# Patient Record
Sex: Male | Born: 1935 | Race: White | Hispanic: No | Marital: Single | Smoking: Former smoker
Health system: Southern US, Community
[De-identification: ages and names within clinical notes are randomized; demographics above are authoritative.]

## PROBLEM LIST (undated history)

## (undated) DIAGNOSIS — G453 Amaurosis fugax: Secondary | ICD-10-CM

## (undated) DIAGNOSIS — K219 Gastro-esophageal reflux disease without esophagitis: Secondary | ICD-10-CM

## (undated) DIAGNOSIS — I639 Cerebral infarction, unspecified: Secondary | ICD-10-CM

## (undated) DIAGNOSIS — E039 Hypothyroidism, unspecified: Secondary | ICD-10-CM

## (undated) DIAGNOSIS — N4289 Other specified disorders of prostate: Secondary | ICD-10-CM

## (undated) DIAGNOSIS — G20 Parkinson's disease: Secondary | ICD-10-CM

## (undated) DIAGNOSIS — H8309 Labyrinthitis, unspecified ear: Secondary | ICD-10-CM

## (undated) DIAGNOSIS — Z87442 Personal history of urinary calculi: Secondary | ICD-10-CM

## (undated) DIAGNOSIS — N200 Calculus of kidney: Secondary | ICD-10-CM

## (undated) DIAGNOSIS — R51 Headache: Secondary | ICD-10-CM

## (undated) DIAGNOSIS — I10 Essential (primary) hypertension: Secondary | ICD-10-CM

## (undated) DIAGNOSIS — Z7901 Long term (current) use of anticoagulants: Secondary | ICD-10-CM

## (undated) DIAGNOSIS — Z8719 Personal history of other diseases of the digestive system: Secondary | ICD-10-CM

## (undated) DIAGNOSIS — G20A1 Parkinson's disease without dyskinesia, without mention of fluctuations: Secondary | ICD-10-CM

## (undated) DIAGNOSIS — H269 Unspecified cataract: Secondary | ICD-10-CM

## (undated) DIAGNOSIS — N400 Enlarged prostate without lower urinary tract symptoms: Secondary | ICD-10-CM

## (undated) DIAGNOSIS — L111 Transient acantholytic dermatosis [Grover]: Secondary | ICD-10-CM

## (undated) DIAGNOSIS — E7800 Pure hypercholesterolemia, unspecified: Secondary | ICD-10-CM

## (undated) DIAGNOSIS — R519 Headache, unspecified: Secondary | ICD-10-CM

## (undated) HISTORY — DX: Benign prostatic hyperplasia without lower urinary tract symptoms: N40.0

## (undated) HISTORY — DX: Headache, unspecified: R51.9

## (undated) HISTORY — PX: TONSILLECTOMY: SUR1361

## (undated) HISTORY — DX: Parkinson's disease: G20

## (undated) HISTORY — DX: Headache: R51

## (undated) HISTORY — DX: Cerebral infarction, unspecified: I63.9

## (undated) HISTORY — DX: Personal history of urinary calculi: Z87.442

## (undated) HISTORY — DX: Gastro-esophageal reflux disease without esophagitis: K21.9

## (undated) HISTORY — DX: Parkinson's disease without dyskinesia, without mention of fluctuations: G20.A1

## (undated) HISTORY — DX: Unspecified cataract: H26.9

## (undated) HISTORY — PX: LITHOTRIPSY: SUR834

## (undated) HISTORY — DX: Hypothyroidism, unspecified: E03.9

## (undated) HISTORY — DX: Labyrinthitis, unspecified ear: H83.09

## (undated) HISTORY — PX: CATARACT EXTRACTION, BILATERAL: SHX1313

## (undated) HISTORY — DX: Long term (current) use of anticoagulants: Z79.01

## (undated) HISTORY — DX: Calculus of kidney: N20.0

## (undated) HISTORY — DX: Amaurosis fugax: G45.3

## (undated) HISTORY — DX: Transient acantholytic dermatosis (grover): L11.1

## (undated) HISTORY — DX: Personal history of other diseases of the digestive system: Z87.19

---

## 1997-10-11 ENCOUNTER — Other Ambulatory Visit: Admission: RE | Admit: 1997-10-11 | Discharge: 1997-10-11 | Payer: Self-pay | Admitting: Urology

## 2002-08-31 ENCOUNTER — Encounter: Payer: Self-pay | Admitting: Emergency Medicine

## 2002-08-31 ENCOUNTER — Emergency Department (HOSPITAL_COMMUNITY): Admission: EM | Admit: 2002-08-31 | Discharge: 2002-08-31 | Payer: Self-pay | Admitting: Emergency Medicine

## 2003-04-04 ENCOUNTER — Encounter: Admission: RE | Admit: 2003-04-04 | Discharge: 2003-04-04 | Payer: Self-pay | Admitting: Gastroenterology

## 2003-04-06 ENCOUNTER — Emergency Department (HOSPITAL_COMMUNITY): Admission: EM | Admit: 2003-04-06 | Discharge: 2003-04-06 | Payer: Self-pay | Admitting: Emergency Medicine

## 2003-04-06 ENCOUNTER — Encounter: Admission: RE | Admit: 2003-04-06 | Discharge: 2003-04-06 | Payer: Self-pay | Admitting: Gastroenterology

## 2005-10-01 ENCOUNTER — Emergency Department (HOSPITAL_COMMUNITY): Admission: EM | Admit: 2005-10-01 | Discharge: 2005-10-02 | Payer: Self-pay | Admitting: Emergency Medicine

## 2009-09-11 ENCOUNTER — Encounter: Admission: RE | Admit: 2009-09-11 | Discharge: 2009-09-11 | Payer: Self-pay | Admitting: Internal Medicine

## 2010-05-25 ENCOUNTER — Other Ambulatory Visit: Payer: Self-pay | Admitting: Sports Medicine

## 2010-05-25 DIAGNOSIS — M5126 Other intervertebral disc displacement, lumbar region: Secondary | ICD-10-CM

## 2010-05-25 DIAGNOSIS — M545 Low back pain: Secondary | ICD-10-CM

## 2010-05-28 ENCOUNTER — Ambulatory Visit
Admission: RE | Admit: 2010-05-28 | Discharge: 2010-05-28 | Disposition: A | Discharge status: Home / Self Care | Payer: Medicare Other | Source: Ambulatory Visit | Attending: Sports Medicine | Admitting: Sports Medicine

## 2010-05-28 DIAGNOSIS — M545 Low back pain: Secondary | ICD-10-CM

## 2010-05-28 DIAGNOSIS — M5126 Other intervertebral disc displacement, lumbar region: Secondary | ICD-10-CM

## 2012-02-03 ENCOUNTER — Emergency Department (HOSPITAL_COMMUNITY): Payer: Medicare Other

## 2012-02-03 ENCOUNTER — Encounter (HOSPITAL_COMMUNITY): Payer: Self-pay

## 2012-02-03 ENCOUNTER — Inpatient Hospital Stay (HOSPITAL_COMMUNITY)
Admission: EM | Admit: 2012-02-03 | Discharge: 2012-02-06 | DRG: 419 | Disposition: A | Discharge status: Home / Self Care | Payer: Medicare Other | Attending: General Surgery | Admitting: General Surgery

## 2012-02-03 DIAGNOSIS — E7800 Pure hypercholesterolemia, unspecified: Secondary | ICD-10-CM | POA: Diagnosis present

## 2012-02-03 DIAGNOSIS — I10 Essential (primary) hypertension: Secondary | ICD-10-CM

## 2012-02-03 DIAGNOSIS — K8000 Calculus of gallbladder with acute cholecystitis without obstruction: Principal | ICD-10-CM | POA: Diagnosis present

## 2012-02-03 DIAGNOSIS — Z87891 Personal history of nicotine dependence: Secondary | ICD-10-CM

## 2012-02-03 DIAGNOSIS — K810 Acute cholecystitis: Secondary | ICD-10-CM | POA: Diagnosis present

## 2012-02-03 DIAGNOSIS — E876 Hypokalemia: Secondary | ICD-10-CM | POA: Diagnosis not present

## 2012-02-03 DIAGNOSIS — E785 Hyperlipidemia, unspecified: Secondary | ICD-10-CM

## 2012-02-03 DIAGNOSIS — N400 Enlarged prostate without lower urinary tract symptoms: Secondary | ICD-10-CM | POA: Diagnosis present

## 2012-02-03 HISTORY — DX: Pure hypercholesterolemia, unspecified: E78.00

## 2012-02-03 HISTORY — DX: Other specified disorders of prostate: N42.89

## 2012-02-03 HISTORY — DX: Essential (primary) hypertension: I10

## 2012-02-03 LAB — COMPREHENSIVE METABOLIC PANEL
ALT: 34 U/L (ref 0–53)
AST: 27 U/L (ref 0–37)
Albumin: 3.7 g/dL (ref 3.5–5.2)
Calcium: 9.6 mg/dL (ref 8.4–10.5)
Creatinine, Ser: 1.19 mg/dL (ref 0.50–1.35)
Sodium: 135 mEq/L (ref 135–145)
Total Protein: 7.6 g/dL (ref 6.0–8.3)

## 2012-02-03 LAB — CBC WITH DIFFERENTIAL/PLATELET
Basophils Absolute: 0 10*3/uL (ref 0.0–0.1)
Basophils Relative: 0 % (ref 0–1)
Eosinophils Absolute: 0 10*3/uL (ref 0.0–0.7)
Eosinophils Relative: 0 % (ref 0–5)
Lymphocytes Relative: 4 % — ABNORMAL LOW (ref 12–46)
MCH: 29.6 pg (ref 26.0–34.0)
MCHC: 34 g/dL (ref 30.0–36.0)
MCV: 86.9 fL (ref 78.0–100.0)
Monocytes Absolute: 3.3 10*3/uL — ABNORMAL HIGH (ref 0.1–1.0)
Platelets: 581 10*3/uL — ABNORMAL HIGH (ref 150–400)
RDW: 13.5 % (ref 11.5–15.5)
WBC: 22.9 10*3/uL — ABNORMAL HIGH (ref 4.0–10.5)

## 2012-02-03 LAB — POCT I-STAT TROPONIN I: Troponin i, poc: 0.02 ng/mL (ref 0.00–0.08)

## 2012-02-03 MED ORDER — IOHEXOL 300 MG/ML  SOLN
50.0000 mL | Freq: Once | INTRAMUSCULAR | Status: AC | PRN
  Administered 2012-02-03: 50 mL via ORAL

## 2012-02-03 MED ORDER — IOHEXOL 300 MG/ML  SOLN
100.0000 mL | Freq: Once | INTRAMUSCULAR | Status: AC | PRN
  Administered 2012-02-03: 100 mL via INTRAVENOUS

## 2012-02-03 NOTE — ED Notes (Signed)
Pt denies any hx of a 1st degree heart block, pt reports feeling tired and weak

## 2012-02-03 NOTE — ED Provider Notes (Signed)
History     CSN: [id number]  Arrival date & time 02/03/12  1847   First MD Initiated Contact with Patient 02/03/12 2021      Chief Complaint  Patient presents with  . Abdominal Pain    (Consider location/radiation/quality/duration/timing/severity/associated sxs/prior treatment) HPI Comments: Patient comes to the ER for evaluation of abdominal pain. Patient reports onset of upper abdominal pain with nausea and vomiting yesterday after eating. Patient reports that his pain was severe earlier, but has improved. He has not had any fever. There has not been any diarrhea associated with the symptoms. He does report that he has a gallstone, but it has never given him any problems. In addition to abdominal pain, he has had some pressure in his chest that occurs when he lays on his side he gets better if he lies flat on his back. He has not been short of breath.   Past Medical History  Diagnosis Date  . Hypertension   . High cholesterol   . Prostate atrophy     History reviewed. No pertinent past surgical history.  History reviewed. No pertinent family history.  History  Substance Use Topics  . Smoking status: Former Games developer  . Smokeless tobacco: Not on file  . Alcohol Use: Yes      Review of Systems  Constitutional: Negative for fever.  Respiratory: Negative for shortness of breath.   Cardiovascular: Positive for chest pain.  Gastrointestinal: Positive for abdominal pain.  All other systems reviewed and are negative.    Allergies  Review of patient's allergies indicates no known allergies.  Home Medications   Current Outpatient Rx  Name  Route  Sig  Dispense  Refill  . VITAMIN D 1000 UNITS PO TABS   Oral   Take 1,000 Units by mouth daily.         . DUTASTERIDE 0.5 MG PO CAPS   Oral   Take 0.5 mg by mouth every other day.         Marland Kitchen EZETIMIBE 10 MG PO TABS   Oral   Take 10 mg by mouth daily.         . IBUPROFEN 200 MG PO TABS   Oral   Take 200 mg by  mouth every 6 (six) hours as needed. For pain         . LOSARTAN POTASSIUM-HCTZ 100-25 MG PO TABS   Oral   Take 1 tablet by mouth daily.         . ADULT MULTIVITAMIN W/MINERALS CH   Oral   Take 1 tablet by mouth daily. Centrum 50 plus         . FISH OIL 1000 MG PO CAPS   Oral   Take 1,000 mg by mouth daily.         Marland Kitchen VITAMIN C 500 MG PO TABS   Oral   Take 500 mg by mouth daily.           BP 166/79  Pulse 94  Temp 99 F (37.2 C) (Oral)  Resp 22  SpO2 99%  Physical Exam  Constitutional: He is oriented to person, place, and time. He appears well-developed and well-nourished. No distress.  HENT:  Head: Normocephalic and atraumatic.  Right Ear: Hearing normal.  Nose: Nose normal.  Mouth/Throat: Oropharynx is clear and moist and mucous membranes are normal.  Eyes: Conjunctivae normal and EOM are normal. Pupils are equal, round, and reactive to light.  Neck: Normal range of motion. Neck supple.  Cardiovascular:  Normal rate, regular rhythm, S1 normal and S2 normal.  Exam reveals no gallop and no friction rub.   No murmur heard. Pulmonary/Chest: Effort normal and breath sounds normal. No respiratory distress. He exhibits no tenderness.  Abdominal: Soft. Normal appearance and bowel sounds are normal. There is no hepatosplenomegaly. There is tenderness in the right upper quadrant and epigastric area. There is no rebound, no guarding, no tenderness at McBurney's point and negative Murphy's sign. No hernia.       Tenderness very mild, no guarding, rebound or Murphy's sign  Musculoskeletal: Normal range of motion.  Neurological: He is alert and oriented to person, place, and time. He has normal strength. No cranial nerve deficit or sensory deficit. Coordination normal. GCS eye subscore is 4. GCS verbal subscore is 5. GCS motor subscore is 6.  Skin: Skin is warm, dry and intact. No rash noted. No cyanosis.  Psychiatric: He has a normal mood and affect. His speech is normal and  behavior is normal. Thought content normal.    ED Course  Procedures (including critical care time)  Labs Reviewed  CBC WITH DIFFERENTIAL - Abnormal; Notable for the following:    WBC 22.9 (*)     Platelets 581 (*)     Neutrophils Relative 82 (*)     Neutro Abs 18.8 (*)     Lymphocytes Relative 4 (*)     Monocytes Relative 15 (*)     Monocytes Absolute 3.3 (*)     All other components within normal limits  COMPREHENSIVE METABOLIC PANEL - Abnormal; Notable for the following:    Potassium 3.3 (*)     Chloride 95 (*)     Glucose, Bld 135 (*)     Total Bilirubin 1.3 (*)     GFR calc non Af Amer 55 (*)     GFR calc Af Amer 63 (*)     All other components within normal limits  LIPASE, BLOOD  POCT I-STAT TROPONIN I  URINALYSIS, MICROSCOPIC ONLY   Dg Chest 2 View  02/03/2012  *RADIOLOGY REPORT*  Clinical Data: Abdominal pain  CHEST - 2 VIEW  Comparison: 12/30/2008  Findings: Somewhat coarse perihilar bronchovascular markings but no focal infiltrate.  No overt edema.  No effusion.  Heart size normal.  Tortuous thoracic aorta.  Regional bones unremarkable.  IMPRESSION:  1.  No definite acute disease.   Original Report Authenticated By: D. Andria Rhein, MD    Ct Abdomen Pelvis W Contrast  02/03/2012  *RADIOLOGY REPORT*  Clinical Data: Abdominal pain.  CT ABDOMEN AND PELVIS WITH CONTRAST  Technique:  Multidetector CT imaging of the abdomen and pelvis was performed following the standard protocol during bolus administration of intravenous contrast.  Contrast: OMNIPAQUE IOHEXOL 300 MG/ML  SOLN  Comparison: 04/06/2003  Findings: Minimal dependent atelectasis posteriorly in the visualized lung bases, right greater than left.  Stable small hepatic cysts.  No new liver lesion.  Gallbladder is distended containing at least one large stones in its dependent portion, with wall thickening and some adjacent mild inflammatory/edematous changes.  Unremarkable spleen, adrenal glands, pancreas.  Stable  bilateral renal cysts.  No hydronephrosis.  Atheromatous aorta without aneurysm.  Stomach, small bowel, and colon are nondilated. Multiple sigmoid diverticula without adjacent inflammatory/edematous change.  Urinary bladder incompletely distended with a small right posterolateral diverticulum.  There is marked prostatic enlargement.  No ascites.  No free air.  No adenopathy.  Portal vein patent. No hydronephrosis.  Mild spondylitic changes in the lumbar spine.  IMPRESSION:  1.  Cholelithiasis with gallbladder wall thickening and adjacent inflammatory/edematous changes suggesting acute cholecystitis. 2.  Sigmoid diverticulosis. 3.  Marked prostatic enlargement.   Original Report Authenticated By: D. Andria Rhein, MD    Dg Abd 2 Views  02/03/2012  *RADIOLOGY REPORT*  Clinical Data: Upper abdominal pain  ABDOMEN - 2 VIEW  Comparison: CT 04/06/2003  Findings: There are a few gas distended small bowel loops in the mid abdomen.  Normal distribution of gas and stool throughout the colon.  No free air on the erect film.  Regional bones unremarkable.  IMPRESSION:  1.  Nonobstructive bowel gas pattern   Original Report Authenticated By: D. Andria Rhein, MD      Diagnosis: 1. Cholecystitis 2. Hypertension    MDM  Patient presents to the ER for evaluation of abdominal pain. Pain started after eating and was persistent most of the day. Pain has now improved. His exam was actually benign. He has very mild tenderness in the right upper quadrant and epigastric region but no guarding or rebound. Patient tells me that he has a known history of gallstone. He did have normal LFTs but an elevated white count of 22. A CAT scan was performed to further evaluate. His leukocytosis I wanted to rule out diverticulitis, obstruction and other etiologies as well as evaluate the gallbladder and therefore CAT scan was chosen. CAT scan does show findings consistent with acute cholecystitis. Case discussed with Dr. Donell Beers. She will see the  patient in the ER. She asked that internal medicine be consulted to evaluate the patient's hypertension.      Gilda Crease, MD 02/04/12 [phone]

## 2012-02-03 NOTE — ED Notes (Signed)
Pt reports upper abd pain and N/V starting yesterday, pt also reports mid-sternum chest pressure while lying on his side, pt reports the pressure subsides when he lays on his back, pt also reports unable to eat x3 days, was able to eat yogurt, oatmeal, and soup today. Pt's pcp took blood work today b/c d/t pt symptoms

## 2012-02-04 ENCOUNTER — Encounter (HOSPITAL_COMMUNITY): Admission: EM | Disposition: A | Discharge status: Home / Self Care | Payer: Self-pay | Source: Home / Self Care

## 2012-02-04 ENCOUNTER — Encounter (HOSPITAL_COMMUNITY): Payer: Self-pay | Admitting: Certified Registered"

## 2012-02-04 ENCOUNTER — Inpatient Hospital Stay (HOSPITAL_COMMUNITY): Payer: Medicare Other

## 2012-02-04 ENCOUNTER — Inpatient Hospital Stay (HOSPITAL_COMMUNITY): Payer: Medicare Other | Admitting: Certified Registered"

## 2012-02-04 DIAGNOSIS — K8000 Calculus of gallbladder with acute cholecystitis without obstruction: Secondary | ICD-10-CM

## 2012-02-04 DIAGNOSIS — K810 Acute cholecystitis: Secondary | ICD-10-CM

## 2012-02-04 DIAGNOSIS — I10 Essential (primary) hypertension: Secondary | ICD-10-CM

## 2012-02-04 DIAGNOSIS — E785 Hyperlipidemia, unspecified: Secondary | ICD-10-CM

## 2012-02-04 HISTORY — PX: CHOLECYSTECTOMY: SHX55

## 2012-02-04 LAB — COMPREHENSIVE METABOLIC PANEL
AST: 24 U/L (ref 0–37)
CO2: 26 mEq/L (ref 19–32)
Calcium: 9.2 mg/dL (ref 8.4–10.5)
Creatinine, Ser: 1.11 mg/dL (ref 0.50–1.35)
GFR calc non Af Amer: 59 mL/min — ABNORMAL LOW (ref >90)

## 2012-02-04 LAB — URINALYSIS, MICROSCOPIC ONLY
Glucose, UA: NEGATIVE mg/dL
Hgb urine dipstick: NEGATIVE
Leukocytes, UA: NEGATIVE
Specific Gravity, Urine: 1.012 (ref 1.005–1.030)
Urobilinogen, UA: 0.2 mg/dL (ref 0.0–1.0)

## 2012-02-04 LAB — LIPID PANEL
Cholesterol: 187 mg/dL (ref 0–200)
Total CHOL/HDL Ratio: 2.1 RATIO
Triglycerides: 42 mg/dL (ref <150)
VLDL: 8 mg/dL (ref 0–40)

## 2012-02-04 LAB — CBC
Hemoglobin: 13.3 g/dL (ref 13.0–17.0)
MCH: 29.1 pg (ref 26.0–34.0)
RBC: 4.57 MIL/uL (ref 4.22–5.81)

## 2012-02-04 SURGERY — LAPAROSCOPIC CHOLECYSTECTOMY WITH INTRAOPERATIVE CHOLANGIOGRAM
Anesthesia: General | Wound class: Clean Contaminated

## 2012-02-04 MED ORDER — LOSARTAN POTASSIUM-HCTZ 100-25 MG PO TABS: 1.0000 | ORAL_TABLET | Freq: Every day | ORAL | Status: DC

## 2012-02-04 MED ORDER — ROCURONIUM BROMIDE 100 MG/10ML IV SOLN
INTRAVENOUS | Status: DC | PRN
  Administered 2012-02-04: 50 mg via INTRAVENOUS

## 2012-02-04 MED ORDER — KCL IN DEXTROSE-NACL 20-5-0.9 MEQ/L-%-% IV SOLN
INTRAVENOUS | Status: DC
  Administered 2012-02-04 – 2012-02-05 (×3): via INTRAVENOUS
  Filled 2012-02-04 (×6): qty 1000

## 2012-02-04 MED ORDER — LACTATED RINGERS IV SOLN: INTRAVENOUS | Status: DC

## 2012-02-04 MED ORDER — DUTASTERIDE 0.5 MG PO CAPS
0.5000 mg | ORAL_CAPSULE | ORAL | Status: DC
  Administered 2012-02-06: 0.5 mg via ORAL
  Filled 2012-02-04 (×2): qty 1

## 2012-02-04 MED ORDER — FENTANYL CITRATE 0.05 MG/ML IJ SOLN
INTRAMUSCULAR | Status: DC | PRN
  Administered 2012-02-04: 100 ug via INTRAVENOUS

## 2012-02-04 MED ORDER — HYDRALAZINE HCL 20 MG/ML IJ SOLN
10.0000 mg | Freq: Four times a day (QID) | INTRAMUSCULAR | Status: DC | PRN
  Filled 2012-02-04: qty 0.5

## 2012-02-04 MED ORDER — FENTANYL CITRATE 0.05 MG/ML IJ SOLN: 25.0000 ug | INTRAMUSCULAR | Status: DC | PRN

## 2012-02-04 MED ORDER — ACETAMINOPHEN 325 MG PO TABS: 650.0000 mg | ORAL_TABLET | Freq: Four times a day (QID) | ORAL | Status: DC | PRN

## 2012-02-04 MED ORDER — SODIUM CHLORIDE 0.9 % IR SOLN
Status: DC | PRN
  Administered 2012-02-04: 1000 mL

## 2012-02-04 MED ORDER — GLYCOPYRROLATE 0.2 MG/ML IJ SOLN
INTRAMUSCULAR | Status: DC | PRN
  Administered 2012-02-04: 0.1 mg via INTRAVENOUS
  Administered 2012-02-04: 0.4 mg via INTRAVENOUS
  Administered 2012-02-04: 0.1 mg via INTRAVENOUS

## 2012-02-04 MED ORDER — PANTOPRAZOLE SODIUM 40 MG IV SOLR
40.0000 mg | Freq: Every day | INTRAVENOUS | Status: DC
  Administered 2012-02-04 – 2012-02-05 (×2): 40 mg via INTRAVENOUS
  Filled 2012-02-04 (×3): qty 40

## 2012-02-04 MED ORDER — PHENYLEPHRINE HCL 10 MG/ML IJ SOLN
INTRAMUSCULAR | Status: DC | PRN
  Administered 2012-02-04: 80 ug via INTRAVENOUS

## 2012-02-04 MED ORDER — OXYCODONE HCL 5 MG/5ML PO SOLN: 5.0000 mg | Freq: Once | ORAL | Status: DC | PRN

## 2012-02-04 MED ORDER — DIPHENHYDRAMINE HCL 50 MG/ML IJ SOLN: 12.5000 mg | Freq: Four times a day (QID) | INTRAMUSCULAR | Status: DC | PRN

## 2012-02-04 MED ORDER — OXYCODONE HCL 5 MG PO TABS: 5.0000 mg | ORAL_TABLET | Freq: Once | ORAL | Status: DC | PRN

## 2012-02-04 MED ORDER — EZETIMIBE 10 MG PO TABS
10.0000 mg | ORAL_TABLET | Freq: Every day | ORAL | Status: DC
  Administered 2012-02-04 – 2012-02-06 (×3): 10 mg via ORAL
  Filled 2012-02-04 (×3): qty 1

## 2012-02-04 MED ORDER — POTASSIUM CHLORIDE 10 MEQ/100ML IV SOLN
10.0000 meq | INTRAVENOUS | Status: AC
  Administered 2012-02-04 (×4): 10 meq via INTRAVENOUS
  Filled 2012-02-04 (×4): qty 100

## 2012-02-04 MED ORDER — MORPHINE SULFATE 2 MG/ML IJ SOLN
1.0000 mg | INTRAMUSCULAR | Status: DC | PRN
  Administered 2012-02-04 (×2): 2 mg via INTRAVENOUS
  Administered 2012-02-04: 1 mg via INTRAVENOUS
  Administered 2012-02-04 – 2012-02-05 (×2): 2 mg via INTRAVENOUS
  Filled 2012-02-04 (×5): qty 1

## 2012-02-04 MED ORDER — HYDROCHLOROTHIAZIDE 25 MG PO TABS
25.0000 mg | ORAL_TABLET | Freq: Every day | ORAL | Status: DC
  Administered 2012-02-04: 25 mg via ORAL
  Filled 2012-02-04 (×3): qty 1

## 2012-02-04 MED ORDER — LACTATED RINGERS IV SOLN
INTRAVENOUS | Status: DC | PRN
  Administered 2012-02-04 (×2): via INTRAVENOUS

## 2012-02-04 MED ORDER — BUPIVACAINE-EPINEPHRINE 0.25% -1:200000 IJ SOLN
INTRAMUSCULAR | Status: DC | PRN
  Administered 2012-02-04: 30 mL

## 2012-02-04 MED ORDER — LOSARTAN POTASSIUM 50 MG PO TABS
100.0000 mg | ORAL_TABLET | Freq: Every day | ORAL | Status: DC
  Administered 2012-02-05 – 2012-02-06 (×2): 100 mg via ORAL
  Filled 2012-02-04 (×3): qty 2

## 2012-02-04 MED ORDER — ONDANSETRON HCL 4 MG/2ML IJ SOLN: 4.0000 mg | Freq: Four times a day (QID) | INTRAMUSCULAR | Status: DC | PRN

## 2012-02-04 MED ORDER — BUPIVACAINE-EPINEPHRINE PF 0.25-1:200000 % IJ SOLN
INTRAMUSCULAR | Status: AC
  Filled 2012-02-04: qty 30

## 2012-02-04 MED ORDER — SODIUM CHLORIDE 0.9 % IV SOLN
INTRAVENOUS | Status: DC | PRN
  Administered 2012-02-04: 14:00:00

## 2012-02-04 MED ORDER — PROPOFOL 10 MG/ML IV BOLUS
INTRAVENOUS | Status: DC | PRN
  Administered 2012-02-04: 180 mg via INTRAVENOUS

## 2012-02-04 MED ORDER — LIDOCAINE HCL (CARDIAC) 20 MG/ML IV SOLN
INTRAVENOUS | Status: DC | PRN
  Administered 2012-02-04: 70 mg via INTRAVENOUS

## 2012-02-04 MED ORDER — MORPHINE SULFATE 4 MG/ML IJ SOLN: 4.0000 mg | INTRAMUSCULAR | Status: DC | PRN

## 2012-02-04 MED ORDER — ACETAMINOPHEN 650 MG RE SUPP: 650.0000 mg | Freq: Four times a day (QID) | RECTAL | Status: DC | PRN

## 2012-02-04 MED ORDER — SODIUM CHLORIDE 0.9 % IV SOLN
3.0000 g | Freq: Four times a day (QID) | INTRAVENOUS | Status: DC
  Administered 2012-02-04 – 2012-02-06 (×9): 3 g via INTRAVENOUS
  Filled 2012-02-04 (×12): qty 3

## 2012-02-04 MED ORDER — ONDANSETRON HCL 4 MG/2ML IJ SOLN
INTRAMUSCULAR | Status: DC | PRN
  Administered 2012-02-04: 4 mg via INTRAVENOUS

## 2012-02-04 MED ORDER — NEOSTIGMINE METHYLSULFATE 1 MG/ML IJ SOLN
INTRAMUSCULAR | Status: DC | PRN
  Administered 2012-02-04: 3 mg via INTRAVENOUS
  Administered 2012-02-04: 1 mg via INTRAVENOUS

## 2012-02-04 MED ORDER — DIPHENHYDRAMINE HCL 12.5 MG/5ML PO ELIX
12.5000 mg | ORAL_SOLUTION | Freq: Four times a day (QID) | ORAL | Status: DC | PRN
  Filled 2012-02-04: qty 5

## 2012-02-04 SURGICAL SUPPLY — 40 items
ADH SKN CLS APL DERMABOND .7 (GAUZE/BANDAGES/DRESSINGS) ×1
APPLIER CLIP ROT 10 11.4 M/L (STAPLE) ×2
APR CLP MED LRG 11.4X10 (STAPLE) ×1
BAG SPEC RTRVL LRG 6X4 10 (ENDOMECHANICALS) ×1
BLADE SURG ROTATE 9660 (MISCELLANEOUS) ×1 IMPLANT
CANISTER SUCTION 2500CC (MISCELLANEOUS) ×2 IMPLANT
CATH REDDICK CHOLANGI 4FR 50CM (CATHETERS) ×2 IMPLANT
CHLORAPREP W/TINT 26ML (MISCELLANEOUS) ×2 IMPLANT
CLIP APPLIE ROT 10 11.4 M/L (STAPLE) ×1 IMPLANT
CLOTH BEACON ORANGE TIMEOUT ST (SAFETY) ×2 IMPLANT
COVER MAYO STAND STRL (DRAPES) ×2 IMPLANT
COVER SURGICAL LIGHT HANDLE (MISCELLANEOUS) ×2 IMPLANT
DECANTER SPIKE VIAL GLASS SM (MISCELLANEOUS) ×3 IMPLANT
DERMABOND ADVANCED (GAUZE/BANDAGES/DRESSINGS) ×1
DERMABOND ADVANCED .7 DNX12 (GAUZE/BANDAGES/DRESSINGS) ×1 IMPLANT
DRAPE C-ARM 42X72 X-RAY (DRAPES) ×2 IMPLANT
DRAPE UTILITY 15X26 W/TAPE STR (DRAPE) ×4 IMPLANT
ELECT REM PT RETURN 9FT ADLT (ELECTROSURGICAL) ×2
ELECTRODE REM PT RTRN 9FT ADLT (ELECTROSURGICAL) ×1 IMPLANT
GLOVE BIO SURGEON STRL SZ7.5 (GLOVE) ×3 IMPLANT
GLOVE BIOGEL PI IND STRL 7.5 (GLOVE) IMPLANT
GLOVE BIOGEL PI INDICATOR 7.5 (GLOVE) ×1
GOWN STRL NON-REIN LRG LVL3 (GOWN DISPOSABLE) ×7 IMPLANT
IV CATH 14GX2 1/4 (CATHETERS) ×2 IMPLANT
KIT BASIN OR (CUSTOM PROCEDURE TRAY) ×2 IMPLANT
KIT ROOM TURNOVER OR (KITS) ×2 IMPLANT
NS IRRIG 1000ML POUR BTL (IV SOLUTION) ×2 IMPLANT
PAD ARMBOARD 7.5X6 YLW CONV (MISCELLANEOUS) ×2 IMPLANT
POUCH SPECIMEN RETRIEVAL 10MM (ENDOMECHANICALS) ×2 IMPLANT
SCISSORS LAP 5X35 DISP (ENDOMECHANICALS) IMPLANT
SET IRRIG TUBING LAPAROSCOPIC (IRRIGATION / IRRIGATOR) ×2 IMPLANT
SLEEVE ENDOPATH XCEL 5M (ENDOMECHANICALS) ×2 IMPLANT
SPECIMEN JAR SMALL (MISCELLANEOUS) ×2 IMPLANT
SUT MNCRL AB 4-0 PS2 18 (SUTURE) ×2 IMPLANT
TOWEL OR 17X24 6PK STRL BLUE (TOWEL DISPOSABLE) ×2 IMPLANT
TOWEL OR 17X26 10 PK STRL BLUE (TOWEL DISPOSABLE) ×2 IMPLANT
TRAY LAPAROSCOPIC (CUSTOM PROCEDURE TRAY) ×2 IMPLANT
TROCAR XCEL BLUNT TIP 100MML (ENDOMECHANICALS) ×2 IMPLANT
TROCAR XCEL NON-BLD 11X100MML (ENDOMECHANICALS) ×2 IMPLANT
TROCAR XCEL NON-BLD 5MMX100MML (ENDOMECHANICALS) ×2 IMPLANT

## 2012-02-04 NOTE — Progress Notes (Signed)
ARRIVED 6N28, DROWSY BUT EASILY AROUSABLE, DENIES NAUSEA/PAIN, ORIENTED TO ROOM AND SURROUNDINGS

## 2012-02-04 NOTE — Progress Notes (Signed)
Subjective: Pt in minimal RUQ and back pain with meds, patient hungry and thirsty since he hasn't eaten in 2 days.  Pt urinating regularly, no BM.    Objective: Vital signs in last 24 hours: Temp:  [98.6 F (37 C)-99.8 F (37.7 C)] 99 F (37.2 C) (01/14 0500) Pulse Rate:  [87-104] 92  (01/14 0500) Resp:  [16-22] 16  (01/14 0500) BP: (140-173)/(69-83) 161/69 mmHg (01/14 0500) SpO2:  [96 %-100 %] 97 % (01/14 0500) Weight:  [151 lb (68.493 kg)] 151 lb (68.493 kg) (01/14 0335)    Intake/Output from previous day: 01/13 0701 - 01/14 0700 In: 261.7 [I.V.:161.7; IV Piggyback:100] Out: 100 [Urine:100] Intake/Output this shift:    PE: Gen:  Alert, NAD, pleasant Abd: Soft, mildly distended, mil pain in RUQ, +BS, no HSM   Lab Results:   [facility] 02/04/12 0550 02/03/12 1959  WBC 23.6* 22.9*  HGB 13.3 14.9  HCT 39.4 43.8  PLT 500* 581*   BMET  Basename 02/04/12 0550 02/03/12 1959  NA 136 135  K 3.1* 3.3*  CL 96 95*  CO2 26 27  GLUCOSE 140* 135*  BUN 16 19  CREATININE 1.11 1.19  CALCIUM 9.2 9.6   PT/INR No results found for this basename: LABPROT:2,INR:2 in the last 72 hours CMP     Component Value Date/Time   NA 136 02/04/2012 0550   K 3.1* 02/04/2012 0550   CL 96 02/04/2012 0550   CO2 26 02/04/2012 0550   GLUCOSE 140* 02/04/2012 0550   BUN 16 02/04/2012 0550   CREATININE 1.11 02/04/2012 0550   CALCIUM 9.2 02/04/2012 0550   PROT 6.7 02/04/2012 0550   ALBUMIN 3.1* 02/04/2012 0550   AST 24 02/04/2012 0550   ALT 26 02/04/2012 0550   ALKPHOS 37* 02/04/2012 0550   BILITOT 1.4* 02/04/2012 0550   GFRNONAA 59* 02/04/2012 0550   GFRAA 69* 02/04/2012 0550   Lipase     Component Value Date/Time   LIPASE 16 02/03/2012 1959       Studies/Results: Dg Chest 2 View  02/03/2012  *RADIOLOGY REPORT*  Clinical Data: Abdominal pain  CHEST - 2 VIEW  Comparison: 12/30/2008  Findings: Somewhat coarse perihilar bronchovascular markings but no focal infiltrate.  No overt edema.  No  effusion.  Heart size normal.  Tortuous thoracic aorta.  Regional bones unremarkable.  IMPRESSION:  1.  No definite acute disease.   Original Report Authenticated By: D. Andria Rhein, MD    Ct Abdomen Pelvis W Contrast  02/03/2012  *RADIOLOGY REPORT*  Clinical Data: Abdominal pain.  CT ABDOMEN AND PELVIS WITH CONTRAST  Technique:  Multidetector CT imaging of the abdomen and pelvis was performed following the standard protocol during bolus administration of intravenous contrast.  Contrast: OMNIPAQUE IOHEXOL 300 MG/ML  SOLN  Comparison: 04/06/2003  Findings: Minimal dependent atelectasis posteriorly in the visualized lung bases, right greater than left.  Stable small hepatic cysts.  No new liver lesion.  Gallbladder is distended containing at least one large stones in its dependent portion, with wall thickening and some adjacent mild inflammatory/edematous changes.  Unremarkable spleen, adrenal glands, pancreas.  Stable bilateral renal cysts.  No hydronephrosis.  Atheromatous aorta without aneurysm.  Stomach, small bowel, and colon are nondilated. Multiple sigmoid diverticula without adjacent inflammatory/edematous change.  Urinary bladder incompletely distended with a small right posterolateral diverticulum.  There is marked prostatic enlargement.  No ascites.  No free air.  No adenopathy.  Portal vein patent. No hydronephrosis.  Mild spondylitic changes in the  lumbar spine.  IMPRESSION:  1.  Cholelithiasis with gallbladder wall thickening and adjacent inflammatory/edematous changes suggesting acute cholecystitis. 2.  Sigmoid diverticulosis. 3.  Marked prostatic enlargement.   Original Report Authenticated By: D. Andria Rhein, MD    Dg Abd 2 Views  02/03/2012  *RADIOLOGY REPORT*  Clinical Data: Upper abdominal pain  ABDOMEN - 2 VIEW  Comparison: CT 04/06/2003  Findings: There are a few gas distended small bowel loops in the mid abdomen.  Normal distribution of gas and stool throughout the colon.  No free  air on the erect film.  Regional bones unremarkable.  IMPRESSION:  1.  Nonobstructive bowel gas pattern   Original Report Authenticated By: D. Andria Rhein, MD     Anti-infectives: Anti-infectives     Start     Dose/Rate Route Frequency Ordered Stop   02/04/12 0500   Ampicillin-Sulbactam (UNASYN) 3 g in sodium chloride 0.9 % 100 mL IVPB        3 g 100 mL/hr over 60 Minutes Intravenous Every 6 hours 02/04/12 0336             Assessment/Plan Acute cholecystitis - Leukocytosis - WBC now at 23.6 from 22.9 -IVF  -IV antibiotics  -NPO  -Lap chole probably later today or tomorrow if OR schedule permits  HTN/HLD/BPH- home meds  Hypokalemia-re-pleating potassium    LOS: 1 day    Cameron, Kyle Liew 02/04/2012, 7:44 AM Pager: [phone]

## 2012-02-04 NOTE — Anesthesia Postprocedure Evaluation (Signed)
Anesthesia Post Note  Patient: Kyle Cameron  Procedure(s) Performed: Procedure(s) (LRB): LAPAROSCOPIC CHOLECYSTECTOMY WITH INTRAOPERATIVE CHOLANGIOGRAM (N/A)  Anesthesia type: General  Patient location: PACU  Post pain: Pain level controlled and Adequate analgesia  Post assessment: Post-op Vital signs reviewed, Patient's Cardiovascular Status Stable, Respiratory Function Stable, Patent Airway and Pain level controlled  Last Vitals:  Filed Vitals:   02/04/12 1530  BP: 172/82  Pulse: 99  Temp:   Resp: 20    Post vital signs: Reviewed and stable  Level of consciousness: awake, alert  and oriented  Complications: No apparent anesthesia complications

## 2012-02-04 NOTE — Anesthesia Procedure Notes (Signed)
Procedure Name: Intubation Date/Time: 02/04/2012 2:07 PM Performed by: Jerilee Hoh Pre-anesthesia Checklist: Patient identified, Emergency Drugs available, Suction available and Patient being monitored Patient Re-evaluated:Patient Re-evaluated prior to inductionOxygen Delivery Method: Circle system utilized Preoxygenation: Pre-oxygenation with 100% oxygen Intubation Type: IV induction Ventilation: Mask ventilation without difficulty Laryngoscope Size: Mac and 3 Grade View: Grade II Tube type: Oral Tube size: 7.5 mm Number of attempts: 1 Airway Equipment and Method: Stylet Placement Confirmation: ETT inserted through vocal cords under direct vision,  positive ETCO2 and breath sounds checked- equal and bilateral Secured at: 22 cm Tube secured with: Tape Dental Injury: Teeth and Oropharynx as per pre-operative assessment

## 2012-02-04 NOTE — Transfer of Care (Signed)
Immediate Anesthesia Transfer of Care Note  Patient: Kyle Cameron  Procedure(s) Performed: Procedure(s) (LRB) with comments: LAPAROSCOPIC CHOLECYSTECTOMY WITH INTRAOPERATIVE CHOLANGIOGRAM (N/A)  Patient Location: PACU  Anesthesia Type:General  Level of Consciousness: awake, alert , oriented and patient cooperative  Airway & Oxygen Therapy: Patient Spontanous Breathing and Patient connected to nasal cannula oxygen  Post-op Assessment: Report given to PACU RN, Post -op Vital signs reviewed and stable and Patient moving all extremities  Post vital signs: Reviewed and stable  Complications: No apparent anesthesia complications

## 2012-02-04 NOTE — Op Note (Signed)
02/03/2012 - 02/04/2012  3:08 PM  PATIENT:  Kyle Cameron  79 y.o. male  PRE-OPERATIVE DIAGNOSIS:  Gallstones  POST-OPERATIVE DIAGNOSIS:  Gallstones with cholecystitis and wall necrosis  PROCEDURE:  Procedure(s) (LRB) with comments: LAPAROSCOPIC CHOLECYSTECTOMY WITH INTRAOPERATIVE CHOLANGIOGRAM (N/A)  SURGEON:  Surgeon(s) and Role:    * Robyne Askew, MD - Primary    * Liz Malady, MD - Assisting  PHYSICIAN ASSISTANT:   ASSISTANTS: Dr. Janee Morn   ANESTHESIA:   general  EBL:  Total I/O In: 1100 [I.V.:1100] Out: 425 [Urine:375; Blood:50]  BLOOD ADMINISTERED:none  DRAINS: none   LOCAL MEDICATIONS USED:  MARCAINE     SPECIMEN:  Source of Specimen:  gallbladder  DISPOSITION OF SPECIMEN:  PATHOLOGY  COUNTS:  YES  TOURNIQUET:  * No tourniquets in log *  DICTATION: .Dragon Dictation @opnoteheader @  Procedure: After informed consent was obtained the patient was brought to the operating room and placed in the supine position on the operating room table. After adequate induction of general anesthesia the patient's abdomen was prepped with ChloraPrep allowed to dry and draped in usual sterile manner. The area below the umbilicus was infiltrated with quarter percent  Marcaine. A small incision was made with a 15 blade knife. The incision was carried down through the subcutaneous tissue bluntly with a hemostat and [employer] retractors. The linea alba was identified. The linea alba was incised with a 15 blade knife and each side was grasped with Coker clamps. The preperitoneal space was then probed with a hemostat until the peritoneum was opened and access was gained to the abdominal cavity. A 0 Vicryl pursestring stitch was placed in the fascia surrounding the opening. A Hassan cannula was then placed through the opening and anchored in place with the previously placed Vicryl purse string stitch. The abdomen was insufflated with carbon dioxide without difficulty. A laparoscope was  inserted through the [employer] cannula in the right upper quadrant was inspected. Next the epigastric region was infiltrated with % Marcaine. A small incision was made with a 15 blade knife. A 10 mm port was placed bluntly through this incision into the abdominal cavity under direct vision. Next 2 sites were chosen laterally on the right side of the abdomen for placement of 5 mm ports. Each of these areas was infiltrated with quarter percent Marcaine. Small stab incisions were made with a 15 blade knife. 5 mm ports were then placed bluntly through these incisions into the abdominal cavity under direct vision without difficulty. A blunt grasper was placed through the lateralmost 5 mm port and used to grasp the dome of the gallbladder and elevated anteriorly and superiorly. The gallbladder was very distended with patchy wall necrosis. It was aspirated with a Nijat aspirator. Another blunt grasper was placed through the other 5 mm port and used to retract the body and neck of the gallbladder. A dissector was placed through the epigastric port and using the electrocautery the peritoneal reflection at the gallbladder neck was opened. Blunt dissection was then carried out in this area until the gallbladder neck-cystic duct junction was readily identified and a good window was created. A single clip was placed on the gallbladder neck. A small  ductotomy was made just below the clip with laparoscopic scissors. A 14-gauge Angiocath was then placed through the anterior abdominal wall under direct vision. A Reddick cholangiogram catheter was then placed through the Angiocath and flushed. The catheter was then placed in the cystic duct and anchored in place with a clip.  A cholangiogram was obtained that showed no filling defects good emptying into the duodenum an adequate length on the cystic duct. The anchoring clip and catheters were then removed from the patient. 3 clips were placed proximally on the cystic duct and the duct  was divided between the 2 sets of clips. Posterior to this the cystic artery was identified and again dissected bluntly in a circumferential manner until a good window  was created. 2 clips were placed proximally and one distally on the artery and the artery was divided between the 2 sets of clips. Next a laparoscopic hook cautery device was used to separate the gallbladder from the liver bed. Prior to completely detaching the gallbladder from the liver bed the liver bed was inspected and several small bleeding points were coagulated with the electrocautery until the area was completely hemostatic. The gallbladder was then detached the rest of it from the liver bed without difficulty. A laparoscopic bag was inserted through the epigastric port. The gallbladder was placed within the bag and the bag was sealed. A laparoscope was then moved to the epigastric port. The gallbladder grasper was placed through the [facility] cannula and used to grasp the opening of the bag. The bag with the gallbladder was then removed with the [facility] cannula through the infraumbilical port without difficulty. The fascial defect was then closed with the previously placed Vicryl pursestring stitch as well as with another figure-of-eight 0 Vicryl stitch. The liver bed was inspected again and found to be hemostatic. The abdomen was irrigated with copious amounts of saline until the effluent was clear. The ports were then removed under direct vision without difficulty and were found to be hemostatic. The gas was allowed to escape. The skin incisions were all closed with interrupted 4-0 Monocryl subcuticular stitches. Dermabond dressings were applied. The patient tolerated the procedure well. At the end of the case all needle sponge and instrument counts were correct. The patient was then awakened and taken to recovery in stable condition   PLAN OF CARE: Admit for overnight observation  PATIENT DISPOSITION:  PACU - hemodynamically stable.     Delay start of Pharmacological VTE agent (>24hrs) due to surgical blood loss or risk of bleeding: yes

## 2012-02-04 NOTE — H&P (Signed)
Kyle Cameron is an 79 y.o. male.   Chief Complaint: Abdominal pain, gallstone HPI:  Pt is 79 yo M with 2 days of worsening abdominal pain, nausea and vomiting.  He has had minimal appetite.  He denies fevers/ chills/jaundice.  He thinks he may have had an unsettled feeling earlier, but this was nothing compared to this.  The pain is better than when he arrived.  He denies prior abdominal surgery.  He denies acholic stools or dark urine.    Past Medical History  Diagnosis Date  . Hypertension   . High cholesterol   . Prostate atrophy     History reviewed. No pertinent past surgical history.  History reviewed. No pertinent family history. Social History:  reports that he has quit smoking. He does not have any smokeless tobacco history on file. He reports that he drinks alcohol. He reports that he does not use illicit drugs.  Allergies: No Known Allergies  MedicationsLong-Term  Prescriptions Show Facility-Administered Medications    cholecalciferol (VITAMIN D) 1000 UNITS tablet   dutasteride (AVODART) 0.5 MG capsule   ezetimibe (ZETIA) 10 MG tablet   ibuprofen (ADVIL,MOTRIN) 200 MG tablet   losartan-hydrochlorothiazide (HYZAAR) 100-25 MG per tablet   Multiple Vitamin (MULTIVITAMIN WITH MINERALS) TABS   Omega-3 Fatty Acids (FISH OIL) 1000 MG CAPS   vitamin C (ASCORBIC ACID) 500 MG tablet     Results for orders placed during the hospital encounter of 02/03/12 (from the past 48 hour(s))  CBC WITH DIFFERENTIAL     Status: Abnormal   Collection Time   02/03/12  7:59 PM      Component Value Range Comment   WBC 22.9 (*) 4.0 - 10.5 K/uL    RBC 5.04  4.22 - 5.81 MIL/uL    Hemoglobin 14.9  13.0 - 17.0 g/dL    HCT 16.1  09.6 - 04.5 %    MCV 86.9  78.0 - 100.0 fL    MCH 29.6  26.0 - 34.0 pg    MCHC 34.0  30.0 - 36.0 g/dL    RDW 40.9  81.1 - 91.4 %    Platelets 581 (*) 150 - 400 K/uL    Neutrophils Relative 82 (*) 43 - 77 %    Neutro Abs 18.8 (*) 1.7 - 7.7 K/uL    Lymphocytes  Relative 4 (*) 12 - 46 %    Lymphs Abs 0.8  0.7 - 4.0 K/uL    Monocytes Relative 15 (*) 3 - 12 %    Monocytes Absolute 3.3 (*) 0.1 - 1.0 K/uL    Eosinophils Relative 0  0 - 5 %    Eosinophils Absolute 0.0  0.0 - 0.7 K/uL    Basophils Relative 0  0 - 1 %    Basophils Absolute 0.0  0.0 - 0.1 K/uL   COMPREHENSIVE METABOLIC PANEL     Status: Abnormal   Collection Time   02/03/12  7:59 PM      Component Value Range Comment   Sodium 135  135 - 145 mEq/L    Potassium 3.3 (*) 3.5 - 5.1 mEq/L    Chloride 95 (*) 96 - 112 mEq/L    CO2 27  19 - 32 mEq/L    Glucose, Bld 135 (*) 70 - 99 mg/dL    BUN 19  6 - 23 mg/dL    Creatinine, Ser 7.82  0.50 - 1.35 mg/dL    Calcium 9.6  8.4 - 95.6 mg/dL    Total Protein 7.6  6.0 - 8.3 g/dL    Albumin 3.7  3.5 - 5.2 g/dL    AST 27  0 - 37 U/L    ALT 34  0 - 53 U/L    Alkaline Phosphatase 40  39 - 117 U/L    Total Bilirubin 1.3 (*) 0.3 - 1.2 mg/dL    GFR calc non Af Amer 55 (*) >90 mL/min    GFR calc Af Amer 63 (*) >90 mL/min   LIPASE, BLOOD     Status: Normal   Collection Time   02/03/12  7:59 PM      Component Value Range Comment   Lipase 16  11 - 59 U/L   POCT I-STAT TROPONIN I     Status: Normal   Collection Time   02/03/12  8:29 PM      Component Value Range Comment   Troponin i, poc 0.02  0.00 - 0.08 ng/mL    Comment 3            URINALYSIS, MICROSCOPIC ONLY     Status: Abnormal   Collection Time   02/03/12 10:03 PM      Component Value Range Comment   Color, Urine YELLOW  YELLOW    APPearance CLOUDY (*) CLEAR    Specific Gravity, Urine 1.012  1.005 - 1.030    pH 6.0  5.0 - 8.0    Glucose, UA NEGATIVE  NEGATIVE mg/dL    Hgb urine dipstick NEGATIVE  NEGATIVE    Bilirubin Urine NEGATIVE  NEGATIVE    Ketones, ur NEGATIVE  NEGATIVE mg/dL    Protein, ur NEGATIVE  NEGATIVE mg/dL    Urobilinogen, UA 0.2  0.0 - 1.0 mg/dL    Nitrite NEGATIVE  NEGATIVE    Leukocytes, UA NEGATIVE  NEGATIVE    WBC, UA 0-2  <3 WBC/hpf    RBC / HPF 0-2  <3 RBC/hpf     Bacteria, UA RARE  RARE    Squamous Epithelial / LPF RARE  RARE    Dg Chest 2 View  02/03/2012  *RADIOLOGY REPORT*  Clinical Data: Abdominal pain  CHEST - 2 VIEW  Comparison: 12/30/2008  Findings: Somewhat coarse perihilar bronchovascular markings but no focal infiltrate.  No overt edema.  No effusion.  Heart size normal.  Tortuous thoracic aorta.  Regional bones unremarkable.  IMPRESSION:  1.  No definite acute disease.   Original Report Authenticated By: D. Andria Rhein, MD    Ct Abdomen Pelvis W Contrast  02/03/2012  *RADIOLOGY REPORT*  Clinical Data: Abdominal pain.  CT ABDOMEN AND PELVIS WITH CONTRAST  Technique:  Multidetector CT imaging of the abdomen and pelvis was performed following the standard protocol during bolus administration of intravenous contrast.  Contrast: OMNIPAQUE IOHEXOL 300 MG/ML  SOLN  Comparison: 04/06/2003  Findings: Minimal dependent atelectasis posteriorly in the visualized lung bases, right greater than left.  Stable small hepatic cysts.  No new liver lesion.  Gallbladder is distended containing at least one large stones in its dependent portion, with wall thickening and some adjacent mild inflammatory/edematous changes.  Unremarkable spleen, adrenal glands, pancreas.  Stable bilateral renal cysts.  No hydronephrosis.  Atheromatous aorta without aneurysm.  Stomach, small bowel, and colon are nondilated. Multiple sigmoid diverticula without adjacent inflammatory/edematous change.  Urinary bladder incompletely distended with a small right posterolateral diverticulum.  There is marked prostatic enlargement.  No ascites.  No free air.  No adenopathy.  Portal vein patent. No hydronephrosis.  Mild spondylitic changes in the lumbar  spine.  IMPRESSION:  1.  Cholelithiasis with gallbladder wall thickening and adjacent inflammatory/edematous changes suggesting acute cholecystitis. 2.  Sigmoid diverticulosis. 3.  Marked prostatic enlargement.   Original Report Authenticated By: D.  Andria Rhein, MD    Dg Abd 2 Views  02/03/2012  *RADIOLOGY REPORT*  Clinical Data: Upper abdominal pain  ABDOMEN - 2 VIEW  Comparison: CT 04/06/2003  Findings: There are a few gas distended small bowel loops in the mid abdomen.  Normal distribution of gas and stool throughout the colon.  No free air on the erect film.  Regional bones unremarkable.  IMPRESSION:  1.  Nonobstructive bowel gas pattern   Original Report Authenticated By: D. Andria Rhein, MD     Review of Systems  All other systems reviewed and are negative.    Blood pressure 140/71, pulse 89, temperature 99.8 F (37.7 C), temperature source Oral, resp. rate 17, SpO2 96.00%. Physical Exam  Constitutional: He is oriented to person, place, and time. He appears well-developed and well-nourished. No distress.  HENT:  Head: Normocephalic and atraumatic.  Right Ear: External ear normal.  Left Ear: External ear normal.  Eyes: Conjunctivae normal are normal. Pupils are equal, round, and reactive to light. No scleral icterus.  Neck: Normal range of motion. Neck supple. No thyromegaly present.  Cardiovascular: Normal rate, regular rhythm, normal heart sounds and intact distal pulses.   Respiratory: Effort normal and breath sounds normal. No respiratory distress. He has no wheezes. He exhibits no tenderness.  GI: Soft. Bowel sounds are normal. He exhibits no distension and no mass. There is tenderness (mild RUQ tenderness to deep palpation). There is no rebound and no guarding.  Neurological: He is alert and oriented to person, place, and time. Coordination normal.  Skin: Skin is warm and dry. He is not diaphoretic.  Psychiatric: He has a normal mood and affect. His behavior is normal. Judgment and thought content normal.     Assessment/Plan Acute cholecystitis IVF IV antibiotics NPO Lap chole probably later today if OR schedule permits. HTN- home meds   Chirag Krueger 02/04/2012, 2:05 AM

## 2012-02-04 NOTE — Consult Note (Addendum)
Requesting physician: Dr. Basil Dess  Reason for consultation: Management of hypertension   History of Present Illness: 79 year old male with history of hypertension , hyperlipidemia, BPH who is and it to the ED with acute onset of right upper quadrant pain associated with nausea and vomiting yesterday morning. He also informs of some subjective fever without any chills. Patient has severe pain over his epigastric area and right upper quadrant and also gives history of having gallstones. He denied any chest pain, palpitations,  shortness of breath, bowel or urinary symptoms. Denies headache, dizziness, blurry vision, weakness.. denies any any change in medications. In the ED patient was noted to have a low-grade temperature with elevated blood pressure and blood work showing significant leukocytosis and mild hyperkalemia. A CT of the abdomen  showed cholelithiasis with thickening of gallbladder  consistent with acute cholecystitis. Surgical consult called for admission.  hospitalist consulted for management of his elevated blood pressure. Patient given IV pain medication in the ED with improvement in symptoms.  Allergies:  No Known Allergies    Past Medical History  Diagnosis Date  . Hypertension   . High cholesterol   . Prostate atrophy     History reviewed. No pertinent past surgical history.  Medications:  Scheduled Meds:   Continuous Infusions:   PRN Meds:.    Social History:  reports that he has quit smoking. He does not have any smokeless tobacco history on file. He reports that he drinks alcohol. He reports that he does not use illicit drugs.  History reviewed. No pertinent family history.  Review of Systems:  Constitutional: Loss of appetite and fatigue , subjective fever . Denies  chills, diaphoresis, HEENT: Denies photophobia, eye pain, redness, hearing loss, ear pain, congestion, sore throat, rhinorrhea, sneezing, mouth sores, trouble swallowing, neck pain, neck  stiffness and tinnitus.   Respiratory: Denies SOB, DOE, cough, chest tightness,  and wheezing.   Cardiovascular: Denies chest pain, palpitations and leg swelling.  Gastrointestinal: nausea, vomiting, abdominal pain, Denies diarrhea, constipation, blood in stool and abdominal distention.  Genitourinary: Denies dysuria, urgency, frequency, hematuria, flank pain and difficulty urinating.  Musculoskeletal: Denies myalgias, back pain, joint swelling, arthralgias and gait problem.  Skin: Denies pallor, rash and wound.  Neurological: Denies dizziness, seizures, syncope, weakness, light-headedness, numbness and headaches.  Hematological: Denies adenopathy. Easy bruising, personal or family bleeding history  Psychiatric/Behavioral: Denies suicidal ideation, mood changes, confusion, nervousness, sleep disturbance and agitation   Physical Exam:  Filed Vitals:   02/03/12 2358 02/04/12 0001 02/04/12 0100 02/04/12 0120  BP: 173/83 170/83 150/73 140/71  Pulse: 100 100 87 89  Temp: 99.8 F (37.7 C)     TempSrc: Oral     Resp: 18 20 16 17   SpO2: 98% 98% 96% 96%    No intake or output data in the 24 hours ending 02/04/12 0233  General: Alert, awake, oriented x3, in no acute distress. HEENT: No bruits, no goiter. Heart: Regular rate and rhythm, without murmurs, rubs, gallops. Lungs: Clear to auscultation bilaterally. Abdomen: Soft, no epigastric tenderness or Murphy's sign on my evaluation, nondistended, positive bowel sounds. Extremities: No clubbing cyanosis or edema with positive pedal pulses. Neuro: Grossly intact, nonfocal.  Labs on Admission:  CBC:    Component Value Date/Time   WBC 22.9* 02/03/2012 1959   HGB 14.9 02/03/2012 1959   HCT 43.8 02/03/2012 1959   PLT 581* 02/03/2012 1959   MCV 86.9 02/03/2012 1959   NEUTROABS 18.8* 02/03/2012 1959   LYMPHSABS 0.8 02/03/2012 1959  MONOABS 3.3* 02/03/2012 1959   EOSABS 0.0 02/03/2012 1959   BASOSABS 0.0 02/03/2012 1959    Basic Metabolic  Panel:    Component Value Date/Time   NA 135 02/03/2012 1959   K 3.3* 02/03/2012 1959   CL 95* 02/03/2012 1959   CO2 27 02/03/2012 1959   BUN 19 02/03/2012 1959   CREATININE 1.19 02/03/2012 1959   GLUCOSE 135* 02/03/2012 1959   CALCIUM 9.6 02/03/2012 1959    Radiological Exams on Admission: Dg Chest 2 View  02/03/2012  *RADIOLOGY REPORT*  Clinical Data: Abdominal pain  CHEST - 2 VIEW  Comparison: 12/30/2008  Findings: Somewhat coarse perihilar bronchovascular markings but no focal infiltrate.  No overt edema.  No effusion.  Heart size normal.  Tortuous thoracic aorta.  Regional bones unremarkable.  IMPRESSION:  1.  No definite acute disease.   Original Report Authenticated By: D. Andria Rhein, MD    Ct Abdomen Pelvis W Contrast  02/03/2012  *RADIOLOGY REPORT*  Clinical Data: Abdominal pain.  CT ABDOMEN AND PELVIS WITH CONTRAST  Technique:  Multidetector CT imaging of the abdomen and pelvis was performed following the standard protocol during bolus administration of intravenous contrast.  Contrast: OMNIPAQUE IOHEXOL 300 MG/ML  SOLN  Comparison: 04/06/2003  Findings: Minimal dependent atelectasis posteriorly in the visualized lung bases, right greater than left.  Stable small hepatic cysts.  No new liver lesion.  Gallbladder is distended containing at least one large stones in its dependent portion, with wall thickening and some adjacent mild inflammatory/edematous changes.  Unremarkable spleen, adrenal glands, pancreas.  Stable bilateral renal cysts.  No hydronephrosis.  Atheromatous aorta without aneurysm.  Stomach, small bowel, and colon are nondilated. Multiple sigmoid diverticula without adjacent inflammatory/edematous change.  Urinary bladder incompletely distended with a small right posterolateral diverticulum.  There is marked prostatic enlargement.  No ascites.  No free air.  No adenopathy.  Portal vein patent. No hydronephrosis.  Mild spondylitic changes in the lumbar spine.  IMPRESSION:  1.   Cholelithiasis with gallbladder wall thickening and adjacent inflammatory/edematous changes suggesting acute cholecystitis. 2.  Sigmoid diverticulosis. 3.  Marked prostatic enlargement.   Original Report Authenticated By: D. Andria Rhein, MD    Dg Abd 2 Views  02/03/2012  *RADIOLOGY REPORT*  Clinical Data: Upper abdominal pain  ABDOMEN - 2 VIEW  Comparison: CT 04/06/2003  Findings: There are a few gas distended small bowel loops in the mid abdomen.  Normal distribution of gas and stool throughout the colon.  No free air on the erect film.  Regional bones unremarkable.  IMPRESSION:  1.  Nonobstructive bowel gas pattern   Original Report Authenticated By: D. Andria Rhein, MD     Assessment/Plan Acute cholecystitis Patient is clinically stable at this time and his pain seems to have been controlled with pain medications received in the ED. He was seen by general surgery with plan on laparoscopic cholecystectomy today. Currently n.p.o. Continue plan per surgery  Hypertension Patient is on lisinopril at home and noted for elevated blood pressure on arrival which likely is triggered by his pain symptoms. I will hold his lisinopril- HCTZ and place him on when necessary IV hydralazine with close monitoring of his blood pressure. He does not have any this factors as per revised cardiac index for need of perioperative beta-blockade.  Hypokalemia Patient receiving KCl in IV fluids.  Patient's EKG stable except for first-degree AV block. He does not have any underlying cardiac history or significant risk factor for CAD. He does  not need any preoperative cardiac workup.  Time Spent on Admission: 70 minutes  Milea Klink 02/04/2012, 2:33 AM   Thank you for the consult. Patient's PCP is Dr Nehemiah Settle with [facility] at [facility]. I have left a message on the voicemail and patient will be followed by the group from  1/14.

## 2012-02-04 NOTE — Preoperative (Signed)
Beta Blockers   Reason not to administer Beta Blockers:Not Applicable 

## 2012-02-04 NOTE — Interval H&P Note (Signed)
History and Physical Interval Note:  02/04/2012 1:50 PM  Kyle Cameron  has presented today for surgery, with the diagnosis of Gallstones  The various methods of treatment have been discussed with the patient and family. After consideration of risks, benefits and other options for treatment, the patient has consented to  Procedure(s) (LRB) with comments: LAPAROSCOPIC CHOLECYSTECTOMY WITH INTRAOPERATIVE CHOLANGIOGRAM (N/A) as a surgical intervention .  The patient's history has been reviewed, patient examined, no change in status, stable for surgery.  I have reviewed the patient's chart and labs.  Questions were answered to the patient's satisfaction.     TOTH III,Kizer Nobbe S

## 2012-02-04 NOTE — Anesthesia Preprocedure Evaluation (Signed)
Anesthesia Evaluation  Patient identified by MRN, date of birth, ID band Patient awake    Reviewed: Allergy & Precautions, H&P , NPO status , Patient's Chart, lab work & pertinent test results  Airway Mallampati: II  Neck ROM: full    Dental   Pulmonary former smoker,          Cardiovascular hypertension,     Neuro/Psych    GI/Hepatic   Endo/Other    Renal/GU      Musculoskeletal   Abdominal   Peds  Hematology   Anesthesia Other Findings   Reproductive/Obstetrics                           Anesthesia Physical Anesthesia Plan  ASA: II  Anesthesia Plan: General   Post-op Pain Management:    Induction: Intravenous  Airway Management Planned: Oral ETT  Additional Equipment:   Intra-op Plan:   Post-operative Plan: Extubation in OR  Informed Consent: I have reviewed the patients History and Physical, chart, labs and discussed the procedure including the risks, benefits and alternatives for the proposed anesthesia with the patient or authorized representative who has indicated his/her understanding and acceptance.     Plan Discussed with: CRNA and Surgeon  Anesthesia Plan Comments:         Anesthesia Quick Evaluation  

## 2012-02-05 LAB — BASIC METABOLIC PANEL
Calcium: 8.5 mg/dL (ref 8.4–10.5)
Creatinine, Ser: 1.29 mg/dL (ref 0.50–1.35)
GFR calc Af Amer: 57 mL/min — ABNORMAL LOW (ref >90)
Sodium: 137 mEq/L (ref 135–145)

## 2012-02-05 MED ORDER — HYDROCODONE-ACETAMINOPHEN 5-325 MG PO TABS: 1.0000 | ORAL_TABLET | ORAL | Status: DC | PRN

## 2012-02-05 NOTE — Progress Notes (Signed)
1 Day Post-Op   Assessment: s/p Procedure(s): LAPAROSCOPIC CHOLECYSTECTOMY WITH INTRAOPERATIVE CHOLANGIOGRAM Patient Active Problem List  Diagnosis  . Acute cholecystitis  . Hypertension  . Hyperlipidemia    Improved one day s/p cholecystectomy Mild hypokalemia  Plan: Advance diet Re check bmet today as K yesterday 3.1. Recheck CBC in am (wbc 22K yesterday). Slow IVF  Subjective: Feels OK, mild pain, no nausea, had trouble voiding yesterday, I&O cath done.   Objective: Vital signs in last 24 hours: Temp:  [97.4 F (36.3 C)-100.6 F (38.1 C)] 98.9 F (37.2 C) (01/15 0505) Pulse Rate:  [88-105] 88  (01/15 0505) Resp:  [14-20] 17  (01/15 0505) BP: (120-172)/(58-84) 127/63 mmHg (01/15 0505) SpO2:  [95 %-100 %] 95 % (01/15 0505)   Intake/Output from previous day: 01/14 0701 - 01/15 0700 In: 1220 [P.O.:120; I.V.:1100] Out: 1060 [Urine:1010; Blood:50] Intake/Output this shift:     General appearance: alert, cooperative and no distress Resp: clear to auscultation bilaterally Cardio: regular rate and rhythm, S1, S2 normal, no murmur, click, rub or gallop GI: Mildly distended, soft, BS +, mild tender c/w post op.  Incision: healing well  Lab Results:   Basename 02/04/12 0550 02/03/12 1959  WBC 23.6* 22.9*  HGB 13.3 14.9  HCT 39.4 43.8  PLT 500* 581*   BMET  Basename 02/04/12 0550 02/03/12 1959  NA 136 135  K 3.1* 3.3*  CL 96 95*  CO2 26 27  GLUCOSE 140* 135*  BUN 16 19  CREATININE 1.11 1.19  CALCIUM 9.2 9.6   PT/INR No results found for this basename: LABPROT:2,INR:2 in the last 72 hours ABG No results found for this basename: PHART:2,PCO2:2,PO2:2,HCO3:2 in the last 72 hours  MEDS, Scheduled    . ampicillin-sulbactam (UNASYN) IV  3 g Intravenous Q6H  . dutasteride  0.5 mg Oral QODAY  . ezetimibe  10 mg Oral Daily  . hydrochlorothiazide  25 mg Oral Daily  . losartan  100 mg Oral Daily  . pantoprazole (PROTONIX) IV  40 mg Intravenous QHS     Studies/Results: Dg Chest 2 View  02/03/2012  *RADIOLOGY REPORT*  Clinical Data: Abdominal pain  CHEST - 2 VIEW  Comparison: 12/30/2008  Findings: Somewhat coarse perihilar bronchovascular markings but no focal infiltrate.  No overt edema.  No effusion.  Heart size normal.  Tortuous thoracic aorta.  Regional bones unremarkable.  IMPRESSION:  1.  No definite acute disease.   Original Report Authenticated By: D. Andria Rhein, MD    Dg Cholangiogram Operative  02/04/2012  *RADIOLOGY REPORT*  Intraoperative cholangiogram  History:  Cholecystitis  Findings:  Gallbladder is been removed, and the cystic duct has been cannulated.  The intrahepatic and extrahepatic biliary ducts appear normal.  No mass or calculus seen.  There is apparent free flow of contrast via the common bile duct into the duodenum.  Conclusion:  No mass or calculus appreciated in the biliary ductal system.   Original Report Authenticated By: Bretta Bang, M.D.    Ct Abdomen Pelvis W Contrast  02/03/2012  *RADIOLOGY REPORT*  Clinical Data: Abdominal pain.  CT ABDOMEN AND PELVIS WITH CONTRAST  Technique:  Multidetector CT imaging of the abdomen and pelvis was performed following the standard protocol during bolus administration of intravenous contrast.  Contrast: OMNIPAQUE IOHEXOL 300 MG/ML  SOLN  Comparison: 04/06/2003  Findings: Minimal dependent atelectasis posteriorly in the visualized lung bases, right greater than left.  Stable small hepatic cysts.  No new liver lesion.  Gallbladder is distended containing at  least one large stones in its dependent portion, with wall thickening and some adjacent mild inflammatory/edematous changes.  Unremarkable spleen, adrenal glands, pancreas.  Stable bilateral renal cysts.  No hydronephrosis.  Atheromatous aorta without aneurysm.  Stomach, small bowel, and colon are nondilated. Multiple sigmoid diverticula without adjacent inflammatory/edematous change.  Urinary bladder incompletely  distended with a small right posterolateral diverticulum.  There is marked prostatic enlargement.  No ascites.  No free air.  No adenopathy.  Portal vein patent. No hydronephrosis.  Mild spondylitic changes in the lumbar spine.  IMPRESSION:  1.  Cholelithiasis with gallbladder wall thickening and adjacent inflammatory/edematous changes suggesting acute cholecystitis. 2.  Sigmoid diverticulosis. 3.  Marked prostatic enlargement.   Original Report Authenticated By: D. Andria Rhein, MD    Dg Abd 2 Views  02/03/2012  *RADIOLOGY REPORT*  Clinical Data: Upper abdominal pain  ABDOMEN - 2 VIEW  Comparison: CT 04/06/2003  Findings: There are a few gas distended small bowel loops in the mid abdomen.  Normal distribution of gas and stool throughout the colon.  No free air on the erect film.  Regional bones unremarkable.  IMPRESSION:  1.  Nonobstructive bowel gas pattern   Original Report Authenticated By: D. Andria Rhein, MD       LOS: 2 days     Currie Paris, MD, [facility], [place] [phone]   02/05/2012 7:51 AM

## 2012-02-05 NOTE — Progress Notes (Signed)
Social visit provided to patient today, he is aware he is managed by surgery, notes have been reviewed. Patient appears to be progressing well status post cholecystectomy, we will be available if any medical issues arise. Please call if we can help

## 2012-02-05 NOTE — Progress Notes (Signed)
Pt unable to void, bladder scan done with results of 427. In and out cath done with an output of 425. Pt feels better now. Will cont to monitor.

## 2012-02-06 ENCOUNTER — Encounter (HOSPITAL_COMMUNITY): Payer: Self-pay | Admitting: General Surgery

## 2012-02-06 LAB — CBC
Platelets: 497 10*3/uL — ABNORMAL HIGH (ref 150–400)
RBC: 4.12 MIL/uL — ABNORMAL LOW (ref 4.22–5.81)
RDW: 13.8 % (ref 11.5–15.5)
WBC: 14.2 10*3/uL — ABNORMAL HIGH (ref 4.0–10.5)

## 2012-02-06 MED ORDER — HYDROCODONE-ACETAMINOPHEN 5-325 MG PO TABS: 1.0000 | ORAL_TABLET | Freq: Four times a day (QID) | ORAL | Status: DC | PRN

## 2012-02-06 NOTE — Discharge Summary (Signed)
Physician Discharge Summary  Patient ID: Kyle Cameron MRN: [id number] DOB/AGE: 04/06/1926 79 y.o.  Admit date: 02/03/2012 Discharge date: 02/06/2012  Admitting Diagnosis: Acute cholecystitis HTN HLD BPH  Discharge Diagnosis Patient Active Problem List   Diagnosis Date Noted  . Acute cholecystitis 02/04/2012  . Hypertension 02/04/2012  . Hyperlipidemia 02/04/2012  BPH  Consultants Dr. Gonzella Lex (IM)  Imaging: Dg Cholangiogram Operative  02/04/2012  *RADIOLOGY REPORT*  Intraoperative cholangiogram  History:  Cholecystitis  Findings:  Gallbladder is been removed, and the cystic duct has been cannulated.  The intrahepatic and extrahepatic biliary ducts appear normal.  No mass or calculus seen.  There is apparent free flow of contrast via the common bile duct into the duodenum.  Conclusion:  No mass or calculus appreciated in the biliary ductal system.   Original Report Authenticated By: Bretta Bang, M.D.     Procedures Dr. Carolynne Edouard (02/04/12):  Laparoscopic cholecystectomy with [facility]  Hospital Course:  79 yo M with 2 days of worsening abdominal pain, nausea and vomiting. He has had minimal appetite. He denies fevers/ chills/jaundice. He thinks he may have had an unsettled feeling earlier, but this was nothing compared to this. The pain is better than when he arrived. He denies prior abdominal surgery. He denies acholic stools or dark urine.   Workup showed acute cholecystitis and leukocytosis.  Patient was admitted and underwent procedure listed above.  Tolerated procedure well and was transferred to the floor.  Diet was advanced as tolerated.  On POD #2, the patient was voiding well, tolerating diet, ambulating well, pain well controlled, vital signs stable, incisions c/d/i and felt stable for discharge home.  Patient will follow up in our office in 2 weeks and knows to call with questions or concerns.  Physical Exam: General:  Alert, NAD, pleasant, comfortable Abd:  Soft, ND, mild  tenderness, incisions C/D/I    Medication List     As of 02/06/2012  8:29 AM    TAKE these medications         AVODART 0.5 MG capsule   Generic drug: dutasteride   Take 0.5 mg by mouth every other day.      cholecalciferol 1000 UNITS tablet   Commonly known as: VITAMIN D   Take 1,000 Units by mouth daily.      ezetimibe 10 MG tablet   Commonly known as: ZETIA   Take 10 mg by mouth daily.      Fish Oil 1000 MG Caps   Take 1,000 mg by mouth daily.      HYDROcodone-acetaminophen 5-325 MG per tablet   Commonly known as: NORCO/VICODIN   Take 1-2 tablets by mouth every 6 (six) hours as needed.      ibuprofen 200 MG tablet   Commonly known as: ADVIL,MOTRIN   Take 200 mg by mouth every 6 (six) hours as needed. For pain      losartan-hydrochlorothiazide 100-25 MG per tablet   Commonly known as: HYZAAR   Take 1 tablet by mouth daily.      multivitamin with minerals Tabs   Take 1 tablet by mouth daily. Centrum 50 plus      vitamin C 500 MG tablet   Commonly known as: ASCORBIC ACID   Take 500 mg by mouth daily.             Follow-up Information    Follow up with [facility]. On 02/25/2012. (YOUR APPT IS AT 10:45 AM, PLEASE ARRIVE AT 10:15 FOR CHECK IN)  Contact information:   [address] [place] [address] [phone]          Signed: [facility] [phone]  02/06/2012, 8:29 AM

## 2012-02-06 NOTE — Progress Notes (Signed)
Patient discharged to home with family.  Discharge teaching completed including follow up care, medications, signs and symptoms of infection and diet.  Verbalizes understanding with no further questions.  Vital signs stable, no complaints of pain.  Tolerating regular diet without complaints of nausea.  Discharged per wheelchair with son.

## 2012-02-25 ENCOUNTER — Encounter (INDEPENDENT_AMBULATORY_CARE_PROVIDER_SITE_OTHER): Payer: Self-pay | Admitting: Internal Medicine

## 2012-02-25 ENCOUNTER — Encounter (INDEPENDENT_AMBULATORY_CARE_PROVIDER_SITE_OTHER): Payer: Medicare Other

## 2012-02-25 ENCOUNTER — Ambulatory Visit (INDEPENDENT_AMBULATORY_CARE_PROVIDER_SITE_OTHER): Payer: Medicare Other | Admitting: Internal Medicine

## 2012-02-25 VITALS — BP 150/82 | HR 84 | Temp 97.2°F | Resp 12 | Ht 67.0 in | Wt 145.4 lb

## 2012-02-25 DIAGNOSIS — K810 Acute cholecystitis: Secondary | ICD-10-CM

## 2012-02-25 NOTE — Patient Instructions (Addendum)
May resume regular activity without restrictions. Follow up as needed. Call with questions or concerns.  

## 2012-02-25 NOTE — Progress Notes (Signed)
  Subjective: Pt returns to the clinic today after undergoing laparoscopic cholecystectomy on 02/04/12 by Dr. Carolynne Edouard.  The patient is tolerating their diet well and is having no severe pain.  Bowel function is good.  No problems with the wounds.  Objective: Vital signs in last 24 hours: Reviewed  PE: Abd: soft, non-tender, +bs, incisions well healed  Lab Results:  No results found for this basename: WBC:2,HGB:2,HCT:2,PLT:2 in the last 72 hours BMET No results found for this basename: NA:2,K:2,CL:2,CO2:2,GLUCOSE:2,BUN:2,CREATININE:2,CALCIUM:2 in the last 72 hours PT/INR No results found for this basename: LABPROT:2,INR:2 in the last 72 hours CMP     Component Value Date/Time   NA 137 02/05/2012 0950   K 3.0* 02/05/2012 0950   CL 97 02/05/2012 0950   CO2 29 02/05/2012 0950   GLUCOSE 116* 02/05/2012 0950   BUN 16 02/05/2012 0950   CREATININE 1.29 02/05/2012 0950   CALCIUM 8.5 02/05/2012 0950   PROT 6.7 02/04/2012 0550   ALBUMIN 3.1* 02/04/2012 0550   AST 24 02/04/2012 0550   ALT 26 02/04/2012 0550   ALKPHOS 37* 02/04/2012 0550   BILITOT 1.4* 02/04/2012 0550   GFRNONAA 50* 02/05/2012 0950   GFRAA 57* 02/05/2012 0950   Lipase     Component Value Date/Time   LIPASE 16 02/03/2012 1959       Studies/Results: No results found.  Anti-infectives: Anti-infectives    None       Assessment/Plan  1.  S/P Laparoscopic Cholecystectomy: doing well, may resume regular activity without restrictions, Pt will follow up with [place] [facility] and knows to call with questions or concerns.     Kyle Cameron 02/25/2012

## 2012-03-09 ENCOUNTER — Other Ambulatory Visit: Payer: Self-pay | Admitting: Dermatology

## 2012-03-12 DIAGNOSIS — R269 Unspecified abnormalities of gait and mobility: Secondary | ICD-10-CM | POA: Insufficient documentation

## 2012-03-12 DIAGNOSIS — G20A1 Parkinson's disease without dyskinesia, without mention of fluctuations: Secondary | ICD-10-CM | POA: Insufficient documentation

## 2012-03-12 DIAGNOSIS — G20 Parkinson's disease: Secondary | ICD-10-CM | POA: Insufficient documentation

## 2012-03-13 ENCOUNTER — Other Ambulatory Visit: Payer: Self-pay | Admitting: Neurology

## 2012-03-13 DIAGNOSIS — R269 Unspecified abnormalities of gait and mobility: Secondary | ICD-10-CM

## 2012-03-13 DIAGNOSIS — G20 Parkinson's disease: Secondary | ICD-10-CM

## 2012-03-18 ENCOUNTER — Ambulatory Visit
Admission: RE | Admit: 2012-03-18 | Discharge: 2012-03-18 | Disposition: A | Discharge status: Home / Self Care | Payer: Medicare Other | Source: Ambulatory Visit | Attending: Neurology | Admitting: Neurology

## 2012-03-18 ENCOUNTER — Other Ambulatory Visit: Payer: Medicare Other

## 2012-03-18 DIAGNOSIS — R269 Unspecified abnormalities of gait and mobility: Secondary | ICD-10-CM

## 2012-03-18 DIAGNOSIS — G20 Parkinson's disease: Secondary | ICD-10-CM

## 2012-04-17 ENCOUNTER — Telehealth: Payer: Self-pay | Admitting: *Deleted

## 2012-04-17 MED ORDER — CARBIDOPA-LEVODOPA 25-100 MG PO TABS: ORAL_TABLET | ORAL | Status: DC

## 2012-04-17 NOTE — Telephone Encounter (Signed)
Patient called stating he has seen no change in his condition while taking Azilect. Patient would like to speak with physician to get his advice on what to do next.

## 2012-04-17 NOTE — Telephone Encounter (Signed)
I talked with the patient. The patient has gotten minimal benefit with his parkinsonism on Azilect. I will call in low dose Sinemet at this time, starting on the 25/100 mg tablets, one half tablet twice daily for 2 weeks, and then go to one half tablet 3 times daily.

## 2012-07-22 ENCOUNTER — Encounter: Payer: Self-pay | Admitting: Neurology

## 2012-07-22 DIAGNOSIS — G20 Parkinson's disease: Secondary | ICD-10-CM

## 2012-07-22 DIAGNOSIS — R269 Unspecified abnormalities of gait and mobility: Secondary | ICD-10-CM

## 2012-07-23 ENCOUNTER — Ambulatory Visit (INDEPENDENT_AMBULATORY_CARE_PROVIDER_SITE_OTHER): Payer: Medicare Other | Admitting: Neurology

## 2012-07-23 ENCOUNTER — Encounter: Payer: Self-pay | Admitting: Neurology

## 2012-07-23 VITALS — BP 132/75 | HR 93 | Ht 67.5 in | Wt 152.0 lb

## 2012-07-23 DIAGNOSIS — R269 Unspecified abnormalities of gait and mobility: Secondary | ICD-10-CM

## 2012-07-23 DIAGNOSIS — G20 Parkinson's disease: Secondary | ICD-10-CM

## 2012-07-23 NOTE — Progress Notes (Signed)
Reason for visit: Parkinson's disease  Kyle Cameron is an 79 y.o. male  History of present illness:  Kyle Cameron is an 79 year old right-handed white male with a history of Parkinson's disease. The patient has been placed on low-dose Azilect, currently taking 1 mg daily. The patient indicates that this dose did not result in any significant effect on his Parkinson's symptoms. The patient was then placed on low-dose Sinemet in addition to the Azilect, currently taking one half of a 25/100 mg tablet 3 times daily. The patient is tolerating the medication, and he is remaining quite active. The patient exercises 3 times a week, and he is working on balance issues and stretching. The patient has had one fall since last seen, associated with a turn. The patient will indicate that he can walk well when he is walking forward, but if he has to turn, he may shuffle his feet. This is a high-risk time for falls. The patient denies any problems with swallowing, but his voice remains hoarse. The patient denies any tremor.  Past Medical History  Diagnosis Date  . Hypertension   . High cholesterol   . Prostate atrophy   . GERD (gastroesophageal reflux disease)   . History of diverticulitis of colon   . Benign enlargement of prostate   . Hypothyroidism   . History of renal calculi   . Parkinson's disease   . Renal calculi     Past Surgical History  Procedure Laterality Date  . Cholecystectomy  02/04/2012    Procedure: LAPAROSCOPIC CHOLECYSTECTOMY WITH INTRAOPERATIVE CHOLANGIOGRAM;  Surgeon: Robyne Askew, MD;  Location: [facility] OR;  Service: General;  Laterality: N/A;  . Lithotripsy      renal calculi  . Tonsillectomy    . Cataract extraction, bilateral      Family History  Problem Relation Age of Onset  . Parkinsonism Mother   . Heart disease Father   . Esophageal cancer Sister     Social history:  reports that he has quit smoking. He does not have any smokeless tobacco history on file. He  reports that  drinks alcohol. He reports that he does not use illicit drugs.  Allergies: No Known Allergies  Medications:  Current Outpatient Prescriptions on File Prior to Visit  Medication Sig Dispense Refill  . carbidopa-levodopa (SINEMET) 25-100 MG per tablet One half tablet twice daily for 2 weeks, then take one half tablet 3 times daily.  50 tablet  3  . cholecalciferol (VITAMIN D) 1000 UNITS tablet Take 1,000 Units by mouth daily.      Marland Kitchen dutasteride (AVODART) 0.5 MG capsule Take 0.5 mg by mouth every other day.      . ezetimibe (ZETIA) 10 MG tablet Take 10 mg by mouth daily.      Marland Kitchen ibuprofen (ADVIL,MOTRIN) 200 MG tablet Take 200 mg by mouth every 6 (six) hours as needed. For pain      . losartan-hydrochlorothiazide (HYZAAR) 100-25 MG per tablet Take 1 tablet by mouth daily.      . Multiple Vitamin (MULTIVITAMIN WITH MINERALS) TABS Take 1 tablet by mouth daily. Centrum 50 plus      . Omega-3 Fatty Acids (FISH OIL) 1000 MG CAPS Take 1,000 mg by mouth daily.      . vitamin C (ASCORBIC ACID) 500 MG tablet Take 500 mg by mouth daily.       No current facility-administered medications on file prior to visit.    ROS:  Out of a complete 14 system  review of symptoms, the patient complains only of the following symptoms, and all other reviewed systems are negative.  Gait disorder  Blood pressure 132/75, pulse 93, height 5' 7.5" (1.715 m), weight 152 lb (68.947 kg).  Physical Exam  General: The patient is alert and cooperative at the time of the examination.  Skin: No significant peripheral edema is noted.   Neurologic Exam  Cranial nerves: Facial symmetry is present. Speech is normal, no aphasia or dysarthria is noted. Speech is dysphonic. Extraocular movements are full. Visual fields are full.  Motor: The patient has good strength in all 4 extremities.  Coordination: The patient has good finger-nose-finger and heel-to-shin bilaterally. No tremors are seen.  Gait and station:  The patient has a normal gait, but with turns, the patient shuffles his feet. With walking, the patient has good arm swing. The patient is able to arise from a seated position with the arms crossed. Tandem gait is slightly unsteady. Romberg is negative. No drift is seen.  Reflexes: Deep tendon reflexes are symmetric.   Assessment/Plan:  One. Parkinson's disease  2. Gait disorder  The patient will continue the low-dose Sinemet and Azilect, the dose will not be changed at this time. The patient will followup in 6 months. The patient is planning a trip in the near future to [place]. The patient is to remain active.  Marlan Palau MD 07/23/2012 8:36 PM  [facility] [address] [place], [place] [address]  Phone [phone] Fax [phone]

## 2012-09-19 ENCOUNTER — Other Ambulatory Visit: Payer: Self-pay | Admitting: Neurology

## 2012-09-24 ENCOUNTER — Other Ambulatory Visit: Payer: Self-pay | Admitting: Neurology

## 2013-01-06 ENCOUNTER — Other Ambulatory Visit: Payer: Self-pay | Admitting: Neurology

## 2013-02-09 ENCOUNTER — Encounter (INDEPENDENT_AMBULATORY_CARE_PROVIDER_SITE_OTHER): Payer: Self-pay

## 2013-02-09 ENCOUNTER — Ambulatory Visit (INDEPENDENT_AMBULATORY_CARE_PROVIDER_SITE_OTHER): Payer: Medicare Other | Admitting: Neurology

## 2013-02-09 ENCOUNTER — Encounter: Payer: Self-pay | Admitting: Neurology

## 2013-02-09 VITALS — BP 133/74 | HR 76 | Wt 157.0 lb

## 2013-02-09 DIAGNOSIS — R269 Unspecified abnormalities of gait and mobility: Secondary | ICD-10-CM

## 2013-02-09 DIAGNOSIS — G20 Parkinson's disease: Secondary | ICD-10-CM

## 2013-02-09 MED ORDER — CARBIDOPA-LEVODOPA 25-100 MG PO TABS: 0.5000 | ORAL_TABLET | Freq: Three times a day (TID) | ORAL | Status: DC

## 2013-02-09 MED ORDER — SELEGILINE HCL 5 MG PO TABS: 5.0000 mg | ORAL_TABLET | Freq: Two times a day (BID) | ORAL | Status: DC

## 2013-02-09 NOTE — Patient Instructions (Signed)

## 2013-02-09 NOTE — Progress Notes (Signed)
Reason for visit: Parkinson's disease  Kyle Cameron is an 80 y.o. male  History of present illness:  Kyle Cameron is an 80 year old right-handed white male with a history of Parkinson's disease. The patient is remaining quite active, walking daily. The patient has been on low-dose Sinemet taking the 25/100 mg tablets 3 times daily. The patient is on Azilect, but the cost of the medications has gone over $400 a month, and he finds that this is difficult to afford. The patient has not had any other new medical issues that have come up since last seen. The patient mainly has issues when he is trying to turn, with shuffling of the feet. The patient has not had any recent falls. The patient denies problems with swallowing or choking. The patient does not drool. Occasionally, the patient will have episodes of leg cramps at night.  Past Medical History  Diagnosis Date  . Hypertension   . High cholesterol   . Prostate atrophy   . GERD (gastroesophageal reflux disease)   . History of diverticulitis of colon   . Benign enlargement of prostate   . Hypothyroidism   . History of renal calculi   . Parkinson's disease   . Renal calculi     Past Surgical History  Procedure Laterality Date  . Cholecystectomy  02/04/2012    Procedure: LAPAROSCOPIC CHOLECYSTECTOMY WITH INTRAOPERATIVE CHOLANGIOGRAM;  Surgeon: Merrie Roof, MD;  Location: [facility];  Service: General;  Laterality: N/A;  . Lithotripsy      renal calculi  . Tonsillectomy    . Cataract extraction, bilateral      Family History  Problem Relation Age of Onset  . Parkinsonism Mother   . Heart disease Father   . Esophageal cancer Sister     Social history:  reports that he has quit smoking. He has never used smokeless tobacco. He reports that he drinks alcohol. He reports that he does not use illicit drugs.   No Known Allergies  Medications:  Current Outpatient Prescriptions on File Prior to Visit  Medication Sig Dispense Refill   . cholecalciferol (VITAMIN D) 1000 UNITS tablet Take 1,000 Units by mouth daily.      Marland Kitchen dutasteride (AVODART) 0.5 MG capsule Take 0.5 mg by mouth every other day.      . ezetimibe (ZETIA) 10 MG tablet Take 10 mg by mouth daily.      Marland Kitchen ibuprofen (ADVIL,MOTRIN) 200 MG tablet Take 200 mg by mouth every 6 (six) hours as needed. For pain      . losartan-hydrochlorothiazide (HYZAAR) 100-25 MG per tablet Take 1 tablet by mouth daily.      . Multiple Vitamin (MULTIVITAMIN WITH MINERALS) TABS Take 1 tablet by mouth daily. Centrum 50 plus      . Omega-3 Fatty Acids (FISH OIL) 1000 MG CAPS Take 1,000 mg by mouth daily.      . vitamin C (ASCORBIC ACID) 500 MG tablet Take 500 mg by mouth daily.       No current facility-administered medications on file prior to visit.    ROS:  Out of a complete 14 system review of symptoms, the patient complains only of the following symptoms, and all other reviewed systems are negative.  Difficulty with turns  Blood pressure 133/74, pulse 76, weight 157 lb (71.215 kg).  Physical Exam  General: The patient is alert and cooperative at the time of the examination.  Skin: No significant peripheral edema is noted.   Neurologic Exam  Mental status: The patient is oriented x 3.  Cranial nerves: Facial symmetry is present. Speech is slightly dysphonic, not aphasic. Extraocular movements are full. Visual fields are full.  Motor: The patient has good strength in all 4 extremities.  Sensory examination: Soft touch sensation on the face, arms, and legs is symmetric.  Coordination: The patient has good finger-nose-finger and heel-to-shin bilaterally. No resting tremors are seen.  Gait and station: The patient has a normal gait. The patient is able to arise from a seated position with the arms crossed. With ambulation, the patient has good stride and good arm swing.  The patient does have shuffling when he makes turns. Tandem gait is normal. Romberg is negative. No  drift is seen.  Reflexes: Deep tendon reflexes are symmetric.   Assessment/Plan:  One. Parkinson's disease  The patient is doing quite well at this point. The patient continues to have some issues with turns, and if he is going to stumble or fall, this is when he has problems. The patient finds that Azilect is too expensive, the medication will be switched over to selegiline. The patient will continue taking the Sinemet tablets 25/100 mg tablets, one half tablet 3 times daily. The patient will followup in 6 months.   Jill Alexanders MD 02/09/2013 2:26 PM  [facility] [address] [place], [place] [address]  Phone [phone] Fax [phone]

## 2013-02-27 ENCOUNTER — Other Ambulatory Visit: Payer: Self-pay | Admitting: Neurology

## 2013-02-28 NOTE — Telephone Encounter (Signed)
Last OV Says: The patient finds that Azilect is too expensive, the medication will be switched over to selegiline

## 2013-08-09 ENCOUNTER — Ambulatory Visit: Payer: Medicare Other | Admitting: Neurology

## 2013-08-17 ENCOUNTER — Ambulatory Visit (INDEPENDENT_AMBULATORY_CARE_PROVIDER_SITE_OTHER): Payer: Medicare Other | Admitting: Neurology

## 2013-08-17 ENCOUNTER — Encounter: Payer: Self-pay | Admitting: Neurology

## 2013-08-17 VITALS — BP 108/72 | HR 68 | Wt 157.0 lb

## 2013-08-17 DIAGNOSIS — R498 Other voice and resonance disorders: Secondary | ICD-10-CM

## 2013-08-17 DIAGNOSIS — G20 Parkinson's disease: Secondary | ICD-10-CM

## 2013-08-17 DIAGNOSIS — R269 Unspecified abnormalities of gait and mobility: Secondary | ICD-10-CM

## 2013-08-17 NOTE — Progress Notes (Signed)
Reason for visit: Parkinson's disease  Kyle Cameron is an 80 y.o. male  History of present illness:  Kyle Cameron is an 80 year old right-handed white male with a history of Parkinson's disease. The patient has done relatively well since last seen, but he has developed some problems with hypophonia. The patient indicates that he has some issues with walking , mainly with turns. He will shuffle his feet, and occasionally he will stumble and fall. The patient stays active, working out 3 times a week, and he travels quite a bit. The patient has some urinary frequency, gets up at least twice a night, but he is able to fall sleep again rapidly. On one occasion, he indicated that his left leg collapsed on him spontaneously. This has not been a recurring event. The patient denies any trouble with swallowing. He returns for an evaluation. He remains on Sinemet taking the 25/100 mg tablet, one half tablet three times daily. The patient was placed on selegiline taking 5 mg the morning and 5 mg at noon. He is tolerating the medications well.  Past Medical History  Diagnosis Date  . Hypertension   . High cholesterol   . Prostate atrophy   . GERD (gastroesophageal reflux disease)   . History of diverticulitis of colon   . Benign enlargement of prostate   . Hypothyroidism   . History of renal calculi   . Parkinson's disease   . Renal calculi     Past Surgical History  Procedure Laterality Date  . Cholecystectomy  02/04/2012    Procedure: LAPAROSCOPIC CHOLECYSTECTOMY WITH INTRAOPERATIVE CHOLANGIOGRAM;  Surgeon: Merrie Roof, MD;  Location: [facility];  Service: General;  Laterality: N/A;  . Lithotripsy      renal calculi  . Tonsillectomy    . Cataract extraction, bilateral      Family History  Problem Relation Age of Onset  . Parkinsonism Mother   . Heart disease Father   . Esophageal cancer Sister     Social history:  reports that he has quit smoking. He has never used smokeless tobacco.  He reports that he drinks alcohol. He reports that he does not use illicit drugs.   No Known Allergies  Medications:  Current Outpatient Prescriptions on File Prior to Visit  Medication Sig Dispense Refill  . carbidopa-levodopa (SINEMET IR) 25-100 MG per tablet Take 0.5 tablets by mouth 3 (three) times daily.  50 tablet  5  . cholecalciferol (VITAMIN D) 1000 UNITS tablet Take 1,000 Units by mouth daily.      Marland Kitchen dutasteride (AVODART) 0.5 MG capsule Take 0.5 mg by mouth every other day.      . ezetimibe (ZETIA) 10 MG tablet Take 10 mg by mouth daily.      Marland Kitchen ibuprofen (ADVIL,MOTRIN) 200 MG tablet Take 200 mg by mouth every 6 (six) hours as needed. For pain      . losartan-hydrochlorothiazide (HYZAAR) 100-25 MG per tablet Take 1 tablet by mouth daily.      . Multiple Vitamin (MULTIVITAMIN WITH MINERALS) TABS Take 1 tablet by mouth daily. Centrum 50 plus      . Omega-3 Fatty Acids (FISH OIL) 1000 MG CAPS Take 1,000 mg by mouth daily.      . selegiline (ELDEPRYL) 5 MG tablet Take 1 tablet (5 mg total) by mouth 2 (two) times daily with a meal. Take with breakfast and lunch  60 tablet  5  . vitamin C (ASCORBIC ACID) 500 MG tablet Take 500 mg by  mouth daily.       No current facility-administered medications on file prior to visit.    ROS:  Out of a complete 14 system review of symptoms, the patient complains only of the following symptoms, and all other reviewed systems are negative.  Frequency of urination Muscle cramps Gait instability  Blood pressure 108/72, pulse 68, weight 157 lb (71.215 kg).  Physical Exam  General: The patient is alert and cooperative at the time of the examination.  Skin: No significant peripheral edema is noted.   Neurologic Exam  Mental status: The patient is oriented x 3.  Cranial nerves: Facial symmetry is present. Speech is hoarse, hypophonic. Extraocular movements are full. Visual fields are full.  Motor: The patient has good strength in all 4  extremities.  Sensory examination: Soft touch sensation is symmetric on the face, arms, or legs.  Coordination: The patient has good finger-nose-finger and heel-to-shin bilaterally.  Gait and station: The patient has a normal gait. The patient is able to arise from a seated position with arms crossed, but he has a tendency to lean backwards. He walks well, with good stride and arm swing, but with turns, he will shuffle his feet. Tandem gait is normal. Romberg is negative. No drift is seen.  Reflexes: Deep tendon reflexes are symmetric.   Assessment/Plan:  1. Parkinson's disease  2. Mild gait instability  3. Hypophonia  The patient will be sent for physical therapy for speech training for hypophonia with Parkinson's disease. The patient will be maintained on his current medications, and he will followup in about 5 months. The patient indicates that he has gained little benefit with his mobility on the medications for Parkinson's.  Jill Alexanders MD 08/17/2013 7:05 PM  [facility] [address] [place], [place] [address]  Phone [phone] Fax [phone]

## 2013-08-17 NOTE — Patient Instructions (Signed)
Parkinson Disease Parkinson disease is a disorder of the central nervous system, which includes the brain and spinal cord. A person with this disease slowly loses the ability to completely control body movements. Within the brain, there is a group of nerve cells (basal ganglia) that help control movement. The basal ganglia are damaged and do not work properly in a person with Parkinson disease. In addition, the basal ganglia produce and use a brain chemical called dopamine. The dopamine chemical sends messages to other parts of the body to control and coordinate body movements. Dopamine levels are low in a person with Parkinson disease. If the dopamine levels are low, then the body does not receive the correct messages it needs to move normally.  CAUSES  The exact reason why the basal ganglia get damaged is not known. Some medical researchers have thought that infection, genes, environment, and certain medicines may contribute to the cause.  SYMPTOMS   An early symptom of Parkinson disease is often an uncontrolled shaking (tremor) of the hands. The tremor will often disappear when the affected hand is consciously used.  As the disease progresses, walking, talking, getting out of a chair, and new movements become more difficult.  Muscles get stiff and movements become slower.  Balance and coordination become harder.  Depression, trouble swallowing, urinary problems, constipation, and sleep problems can occur.  Later in the disease, memory and thought processes may deteriorate. DIAGNOSIS  There are no specific tests to diagnose Parkinson disease. You may be referred to a neurologist for evaluation. Your caregiver will ask about your medical history, symptoms, and perform a physical exam. Blood tests and imaging tests of your brain may be performed to rule out other diseases. The imaging tests may include an MRI or a CT scan. TREATMENT  The goal of treatment is to relieve symptoms. Medicines may be  prescribed once the symptoms become troublesome. Medicine will not stop the progression of the disease, but medicine can make movement and balance better and help control tremors. Speech and occupational therapy may also be prescribed. Sometimes, surgical treatment of the brain can be done in young people. HOME CARE INSTRUCTIONS  Get regular exercise and rest periods during the day to help prevent exhaustion and depression.  If getting dressed becomes difficult, replace buttons and zippers with Velcro and elastic on your clothing.  Take all medicine as directed by your caregiver.  Install grab bars or railings in your home to prevent falls.  Go to speech or occupational therapy as directed.  Keep all follow-up visits as directed by your caregiver. SEEK MEDICAL CARE IF:  Your symptoms are not controlled with your medicine.  You fall.  You have trouble swallowing or choke on your food. MAKE SURE YOU:  Understand these instructions.  Will watch your condition.  Will get help right away if you are not doing well or get worse. Document Released: 01/05/2000 Document Revised: 05/04/2012 Document Reviewed: 02/06/2011 [employer] Patient Information 2015 [employer]. This information is not intended to replace advice given to you by your health care provider. Make sure you discuss any questions you have with your health care provider.  

## 2013-08-25 ENCOUNTER — Other Ambulatory Visit: Payer: Self-pay | Admitting: Neurology

## 2013-09-06 ENCOUNTER — Ambulatory Visit: Payer: Medicare Other | Attending: Neurology

## 2013-09-06 DIAGNOSIS — R471 Dysarthria and anarthria: Secondary | ICD-10-CM | POA: Diagnosis not present

## 2013-09-06 DIAGNOSIS — G20A1 Parkinson's disease without dyskinesia, without mention of fluctuations: Secondary | ICD-10-CM | POA: Insufficient documentation

## 2013-09-06 DIAGNOSIS — G20 Parkinson's disease: Secondary | ICD-10-CM | POA: Diagnosis not present

## 2013-09-06 DIAGNOSIS — IMO0001 Reserved for inherently not codable concepts without codable children: Secondary | ICD-10-CM | POA: Insufficient documentation

## 2013-09-16 ENCOUNTER — Ambulatory Visit: Payer: Medicare Other

## 2013-09-16 DIAGNOSIS — IMO0001 Reserved for inherently not codable concepts without codable children: Secondary | ICD-10-CM | POA: Diagnosis not present

## 2013-09-21 ENCOUNTER — Ambulatory Visit: Payer: Medicare Other | Attending: Neurology

## 2013-09-21 DIAGNOSIS — G20 Parkinson's disease: Secondary | ICD-10-CM | POA: Diagnosis not present

## 2013-09-21 DIAGNOSIS — G20A1 Parkinson's disease without dyskinesia, without mention of fluctuations: Secondary | ICD-10-CM | POA: Insufficient documentation

## 2013-09-21 DIAGNOSIS — IMO0001 Reserved for inherently not codable concepts without codable children: Secondary | ICD-10-CM | POA: Diagnosis not present

## 2013-09-21 DIAGNOSIS — R471 Dysarthria and anarthria: Secondary | ICD-10-CM | POA: Insufficient documentation

## 2013-09-23 ENCOUNTER — Ambulatory Visit: Payer: Medicare Other

## 2013-09-23 DIAGNOSIS — IMO0001 Reserved for inherently not codable concepts without codable children: Secondary | ICD-10-CM | POA: Diagnosis not present

## 2013-09-28 ENCOUNTER — Ambulatory Visit: Payer: Medicare Other

## 2013-09-28 DIAGNOSIS — IMO0001 Reserved for inherently not codable concepts without codable children: Secondary | ICD-10-CM | POA: Diagnosis not present

## 2013-09-29 ENCOUNTER — Ambulatory Visit: Payer: Medicare Other

## 2013-09-29 DIAGNOSIS — IMO0001 Reserved for inherently not codable concepts without codable children: Secondary | ICD-10-CM | POA: Diagnosis not present

## 2013-10-03 ENCOUNTER — Other Ambulatory Visit: Payer: Self-pay | Admitting: Neurology

## 2013-11-02 ENCOUNTER — Ambulatory Visit: Payer: Medicare Other | Attending: Neurology | Admitting: Occupational Therapy

## 2013-11-02 ENCOUNTER — Ambulatory Visit: Payer: Medicare Other | Admitting: Physical Therapy

## 2013-11-02 DIAGNOSIS — R269 Unspecified abnormalities of gait and mobility: Secondary | ICD-10-CM | POA: Insufficient documentation

## 2013-11-02 DIAGNOSIS — G20 Parkinson's disease: Secondary | ICD-10-CM | POA: Insufficient documentation

## 2013-11-02 DIAGNOSIS — Z5189 Encounter for other specified aftercare: Secondary | ICD-10-CM | POA: Diagnosis present

## 2013-11-16 ENCOUNTER — Ambulatory Visit: Payer: Medicare Other | Admitting: Occupational Therapy

## 2013-11-16 ENCOUNTER — Ambulatory Visit: Payer: Medicare Other | Admitting: Physical Therapy

## 2013-11-16 DIAGNOSIS — Z5189 Encounter for other specified aftercare: Secondary | ICD-10-CM | POA: Diagnosis not present

## 2013-11-30 ENCOUNTER — Encounter: Payer: Self-pay | Admitting: Occupational Therapy

## 2013-11-30 ENCOUNTER — Encounter: Payer: Self-pay | Admitting: Physical Therapy

## 2013-11-30 ENCOUNTER — Ambulatory Visit: Payer: Medicare Other | Admitting: Occupational Therapy

## 2013-11-30 ENCOUNTER — Ambulatory Visit: Payer: Medicare Other | Attending: Neurology | Admitting: Physical Therapy

## 2013-11-30 DIAGNOSIS — R279 Unspecified lack of coordination: Secondary | ICD-10-CM

## 2013-11-30 DIAGNOSIS — Z5189 Encounter for other specified aftercare: Secondary | ICD-10-CM | POA: Insufficient documentation

## 2013-11-30 DIAGNOSIS — R269 Unspecified abnormalities of gait and mobility: Secondary | ICD-10-CM | POA: Insufficient documentation

## 2013-11-30 DIAGNOSIS — M6281 Muscle weakness (generalized): Secondary | ICD-10-CM

## 2013-11-30 DIAGNOSIS — G20 Parkinson's disease: Secondary | ICD-10-CM | POA: Diagnosis not present

## 2013-11-30 DIAGNOSIS — R29898 Other symptoms and signs involving the musculoskeletal system: Secondary | ICD-10-CM

## 2013-11-30 NOTE — Therapy (Signed)
Occupational Therapy Treatment  Patient Details  Name: Kyle Cameron MRN: [id number] Date of Birth: 1926/05/20  Encounter Date: 11/30/2013      OT End of Session - 11/30/13 1247    Visit Number 3  3/10 G   Number of Visits 17   Date for OT Re-Evaluation 01/01/14   Authorization Type 1Medicare, 2AARP   OT Start Time 1105   OT Stop Time 1150   OT Time Calculation (min) 45 min   Activity Tolerance Patient tolerated treatment well      Past Medical History  Diagnosis Date  . Hypertension   . High cholesterol   . Prostate atrophy   . GERD (gastroesophageal reflux disease)   . History of diverticulitis of colon   . Benign enlargement of prostate   . Hypothyroidism   . History of renal calculi   . Parkinson's disease   . Renal calculi     Past Surgical History  Procedure Laterality Date  . Cholecystectomy  02/04/2012    Procedure: LAPAROSCOPIC CHOLECYSTECTOMY WITH INTRAOPERATIVE CHOLANGIOGRAM;  Surgeon: Merrie Roof, MD;  Location: [place];  Service: General;  Laterality: N/A;  . Lithotripsy      renal calculi  . Tonsillectomy    . Cataract extraction, bilateral      There were no vitals taken for this visit.  Visit Diagnosis:  No diagnosis found.          OT Treatments/Exercises (OP) - 11/30/13 1222    ADLs   Writing Practiced writing with min-mod cues to use strategies pt was previously instructed in while copying text with approx 25% legibility.  Then practiced alphabet with 75% legiblilty and then with pt stating letters out loud as strategy to improve timing (too fast) with improved legibility.  Practiced zig-zags and squares using lines as visual targets for size with good performance.  Then practiced simple category generation with min cues for spelling and letter formation.  Pt instructed to practice at home and verbalized understanding.     Pt education:  Pt instructed in PWR! Hands (including basic 4) and PWR! Moves (basic 4) in modified quadraped  without PWR! Up during PWR! Step x20 each.  Pt verbalized understanding and returned demo with min-mod cues for timing and big movements.,       OT Short Term Goals - 11/30/13 1100    OT SHORT TERM GOAL #1   Title Pt will verbalize understanding of ways to prevent future complications related to PD.   Time 3  extend to 12/17/13 as pt only seen for 3 visits   Period Weeks   Status On-going   OT SHORT TERM GOAL #2   Title Pt will be independent with PD-specific HEP.   Time 3  extend to 12/17/13 as pt only seen for 3 visits   Period Weeks   Status On-going   OT SHORT TERM GOAL #3   Title Pt will be able to write at least 3 sentences with at least 50% legibility.   Time 3  extend to 12/17/13 as pt only seen for 3 visits   Period Weeks   Status On-going   OT SHORT TERM GOAL #4   Title Pt will verbalize understanding of cognitive compensation strategies/tips prn.   Time 3  extend to 12/17/13 as pt only seen for 3 visits   Period Weeks   Status On-going   OT SHORT TERM GOAL #5   Title Pt will report increased ease with pulling down shirt, performing  buttons, and opening containers.   Time 3  extend to 12/17/13 as pt only seen for 3 visits   Period Weeks   Status On-going          OT Long Term Goals - 11/30/13 1254    OT LONG TERM GOAL #1   Title Pt will verbalize understanidng of adaptive strategies for ADLs/IADLs prn to increase ease/safety.   Time --  01/01/14   Status On-going   OT LONG TERM GOAL #2   Title Pt will verbalize understanding of appropriate community resources related to PD.   Time --  01/01/14   Status On-going   OT LONG TERM GOAL #3   Title Pt will be able to write at least 3 sentences with at least 75% legibility.   Time --  01/01/14   Status On-going   OT LONG TERM GOAL #4   Title Pt will improve balance for ADLs/IADLs as shown by improving standing functional reach by at least 1 inch bilaterally without step.   Time --  01/01/14   Status  On-going   OT LONG TERM GOAL #5   Title Pt will improve RUE functional reaching/coordination for ADLs as shown by improving score on box and blocks test by at least 5.   Time --  01/01/14   Status On-going   Long Term Additional Goals   Additional Long Term Goals Yes   OT LONG TERM GOAL #6   Title Pt will improve ability to don/doff jacket as shown by improving time on PPT#4 by at least 5sec.   Time --  01/01/14   Status On-going          Plan - 11/30/13 1252    Clinical Impression Statement Pt needs reinforcement for timing of movement as he tends to speed up with movement size decreasing   OT Frequency 2x / week   Plan coordination HEP, reinforcement timing of movement        Problem List Patient Active Problem List   Diagnosis Date Noted  . Abnormality of gait 03/12/2012  . Paralysis agitans 03/12/2012  . Acute cholecystitis 02/04/2012  . Hypertension 02/04/2012  . Hyperlipidemia 02/04/2012                                         Kyle Cameron 11/30/2013, 1:37 PM

## 2013-11-30 NOTE — Therapy (Signed)
Physical Therapy Treatment  Patient Details  Name: Kyle Cameron MRN: [id number] Date of Birth: January 24, 1926  Encounter Date: 11/30/2013      PT End of Session - 11/30/13 1245    Visit Number 3   Number of Visits 9   Date for PT Re-Evaluation 01/01/14   PT Start Time 9924   PT Stop Time 1231   PT Time Calculation (min) 38 min   Activity Tolerance Patient tolerated treatment well      Past Medical History  Diagnosis Date  . Hypertension   . High cholesterol   . Prostate atrophy   . GERD (gastroesophageal reflux disease)   . History of diverticulitis of colon   . Benign enlargement of prostate   . Hypothyroidism   . History of renal calculi   . Parkinson's disease   . Renal calculi     Past Surgical History  Procedure Laterality Date  . Cholecystectomy  02/04/2012    Procedure: LAPAROSCOPIC CHOLECYSTECTOMY WITH INTRAOPERATIVE CHOLANGIOGRAM;  Surgeon: Merrie Roof, MD;  Location: [place];  Service: General;  Laterality: N/A;  . Lithotripsy      renal calculi  . Tonsillectomy    . Cataract extraction, bilateral      There were no vitals taken for this visit.  Visit Diagnosis:  Abnormality of gait      Subjective Assessment - 11/30/13 1156    Symptoms No falls since previous therapy visit   Currently in Pain? No/denies            [facility] Adult PT Treatment/Exercise - 11/30/13 1326    High Level Balance   High Level Balance Activities Backward walking;Sudden stops  /forward walking weight shifting          PT Education - 11/30/13 1243    Education provided Yes   Education Details Review seated and standing PWR! Moves exercises given last visit; provided cues for large amplitude movements during exercises   Person(s) Educated Patient   Methods Explanation;Demonstration;Tactile cues;Verbal cues   Comprehension Verbal cues required;Returned demonstration;Verbalized understanding            PT Long Term Goals - 11/30/13 1329    PT LONG TERM GOAL  #1   Title Pt will verbalize/demonstrate understanding of fall prevention techniques within home environment.   Time 4   Period Weeks   Status --  Goal extended as weeks remain in POC.   PT LONG TERM GOAL #2   Title perform at least 8 of 10 reps of sit<>stand transfers from <18 inch surfaces, with no posterior lean, for improved efficiency and safety with transfers.   Time 4   Period Weeks   Status On-going  Goal extended as weeks remain in POC   PT LONG TERM GOAL #3   Title Pt will improve Functional Gait Assessment to at least 20/30 for decreased fall risk.   Time 4   Period Weeks   Status On-going  Goal extended as weeks remain in POC.   PT LONG TERM GOAL #4   Title Pt will verbalize understanding of at least 3 techniques to decrease freezing episodes with gait and turns.   Period Weeks   Status On-going  Goal extended as weeks remain in POC.   PT LONG TERM GOAL #5   Title Pt will verbalize understanding of local Parkinson's disease-related resources   Time 4   Status On-going  Goal extended as weeks remain in POC.   PT LONG TERM GOAL #6  Title Pt will verbalize plan for continued community fitness upon D/C from PT.   Time 4   Period Weeks   Status On-going  Goal extended as weeks remain in POC.   PT LONG TERM GOAL #7   Title Pt will be independent with HEP to address PD specific deficits.   Time 4   Period Weeks   Status On-going  Goal extended as weeks remain in POC.          Plan - 11/30/13 1246    Clinical Impression Statement (p) Pt has not been seen since evaluation due to scheduling conflicts.  Pt reports performing PWR! Moves seating and standing exercises at home; however, pt noted to have festinating/freezing episodes with initiation of gait and  turns.     Pt will benefit from skilled therapeutic intervention in order to improve on the following deficits (p) Abnormal gait;Decreased balance;Decreased strength;Decreased safety awareness;Difficulty  walking;Postural dysfunction  decreased timing and coordination with gait, bradykinesia        Problem List Patient Active Problem List   Diagnosis Date Noted  . Abnormality of gait 03/12/2012  . Paralysis agitans 03/12/2012  . Acute cholecystitis 02/04/2012  . Hypertension 02/04/2012  . Hyperlipidemia 02/04/2012                            PWR ([place]) - 11/30/13 1206    PWR! exercises Moves in sitting;Moves in standing   PWR! Up 20   PWR! Rock 20   PWR! Twist 20   PWR! Twist Step x 20   Comments Cues for deliberate, large amplitude movement   PWR! Step Through Forward/Back at 6 inch step x 10 reps  side step up x 10 reps   PWR! Rock and [employer] multiple reps, on solid then compliant surfaces  Requires cues for pacing and direction changes    PWR! Up 20   PWR! Rock 20   PWR! Twist 20   PWR! Step 20   Comments cues for deliberate, large amplitude movement                          Thayden Lemire W. 11/30/2013, 1:48 PM

## 2013-11-30 NOTE — Patient Instructions (Signed)
PWR! Hands  With arms stretched out in front of you (elbows straight), perform the following:  PWR! Up: Close hands and flick fingers open and apart BIG  PWR! Rock: Move wrists up and down [employer]! Twist: Twist palms up and down BIG  PWR! Step: Touch index finger to thumb while keeping other fingers straight. Flick fingers out BIG (thumb out/straighten fingers). Repeat with other fingers. (Step your thumb to each finger).  Then, start with elbows bent and hands closed.  PWR! Hands: Push hands out BIG. Elbows straight, wrists up, fingers open and spread apart BIG. (Can also perform by pushing down on table, chair, knees. Push above head, out to the side, behind you, in front of you.)   ** Make each movement big and deliberate so that you feel the movement.  Perform at least 10 repetitions 1x/day, but perform PWR! hands throughout the day when you are having trouble using your hands (picking up/manipulating small objects, writing, eating, typing, sewing, buttoning, etc.).      Also perform PWR! Moves in [place].--See handout

## 2013-12-02 ENCOUNTER — Encounter: Payer: Self-pay | Admitting: Physical Therapy

## 2013-12-02 ENCOUNTER — Ambulatory Visit: Payer: Medicare Other | Admitting: Physical Therapy

## 2013-12-02 ENCOUNTER — Ambulatory Visit: Payer: Medicare Other | Admitting: Occupational Therapy

## 2013-12-02 DIAGNOSIS — R29898 Other symptoms and signs involving the musculoskeletal system: Secondary | ICD-10-CM

## 2013-12-02 DIAGNOSIS — R269 Unspecified abnormalities of gait and mobility: Secondary | ICD-10-CM

## 2013-12-02 DIAGNOSIS — R279 Unspecified lack of coordination: Secondary | ICD-10-CM

## 2013-12-02 DIAGNOSIS — Z5189 Encounter for other specified aftercare: Secondary | ICD-10-CM | POA: Diagnosis not present

## 2013-12-02 DIAGNOSIS — M6281 Muscle weakness (generalized): Secondary | ICD-10-CM

## 2013-12-02 NOTE — Therapy (Signed)
Physical Therapy Treatment  Patient Details  Name: Kyle Cameron MRN: [id number] Date of Birth: 04-07-1926  Encounter Date: 12/02/2013      PT End of Session - 12/02/13 1343    Visit Number 4   Number of Visits 9   Date for PT Re-Evaluation 01/01/14   PT Start Time 1020   PT Stop Time 1104   PT Time Calculation (min) 44 min   Activity Tolerance Patient tolerated treatment well      Past Medical History  Diagnosis Date  . Hypertension   . High cholesterol   . Prostate atrophy   . GERD (gastroesophageal reflux disease)   . History of diverticulitis of colon   . Benign enlargement of prostate   . Hypothyroidism   . History of renal calculi   . Parkinson's disease   . Renal calculi     Past Surgical History  Procedure Laterality Date  . Cholecystectomy  02/04/2012    Procedure: LAPAROSCOPIC CHOLECYSTECTOMY WITH INTRAOPERATIVE CHOLANGIOGRAM;  Surgeon: Merrie Roof, MD;  Location: [place];  Service: General;  Laterality: N/A;  . Lithotripsy      renal calculi  . Tonsillectomy    . Cataract extraction, bilateral      There were no vitals taken for this visit.  Visit Diagnosis:  Abnormality of gait      Subjective Assessment - 12/02/13 1327    Symptoms No falls or medical changes   Currently in Pain? No/denies          [facility] Adult PT Treatment/Exercise - 12/02/13 1336    Transfers   Transfers Sit to Stand;Stand to Sit  times 10 reps then times 10 reps with 5# weight overhead   Sit to Stand 7: Independent;Without upper extremity assist;From chair/3-in-1   Stand to Sit 7: Independent;Without upper extremity assist;To chair/3-in-1   Ambulation/Gait   Ambulation/Gait Yes   Ambulation/Gait Assistance 5: Supervision   Ambulation/Gait Assistance Details Had pt perform cognitive task while ambulating.  Pt had increased difficulty with new tasks and actually would stop amb at times.   Ambulation Distance (Feet) 1000 Feet   Assistive device None   Gait velocity  decreased, actually stops at times while performing cognitive task   High Level Balance   High Level Balance Activities Direction changes;Turns;Other (comment)  stepping across river stones with mod assist to balance    High Level Balance Comments pt used walking poles to assist with balance but still required mod assist.           Plan - 12/02/13 1344    Clinical Impression Statement Pt continues to have difficulty with turns, frequent freezing and balance althought denies deficits at times.     Pt will benefit from skilled therapeutic intervention in order to improve on the following deficits Abnormal gait;Difficulty walking;Decreased balance;Decreased strength;Decreased coordination   Rehab Potential Good   PT Frequency 2x / week   PT Duration 4 weeks   PT Treatment/Interventions Therapeutic exercise;Neuromuscular re-education;Therapeutic activities;Functional mobility training;Other (comment);Patient/family education;Gait training;Balance training;ADLs/Self Care Home Management  LSVT/PWR!   PT Next Visit Plan Continue gait and balance with cognitive tasks.  Reinforce intensity of mobility.   Consulted and Agree with Plan of Care Patient      Problem List Patient Active Problem List   Diagnosis Date Noted  . Abnormality of gait 03/12/2012  . Paralysis agitans 03/12/2012  . Acute cholecystitis 02/04/2012  . Hypertension 02/04/2012  . Hyperlipidemia 02/04/2012         PWR (  [facility]) - 12/02/13 1328    PWR! exercises Moves in sitting   PWR! Up 10          LSVT [facility]) - 12/02/13 1341    Rock and [employer] Other reps (comment)  on river stones with walking poles   Pear[place] and [employer] Other reps (comment)  On river stones with walking poles       [name], PTA    [name] 12/02/2013, 1:50 PM

## 2013-12-02 NOTE — Patient Instructions (Signed)
Coordination Exercises  Perform the following exercises for 20 minutes 1 times per day. Perform with both hand(s). Perform using big movements.  Flipping Cards: Place deck of cards on the table. Flip cards over by opening your hand big to grasp and then turn your palm up big. Deal cards: Hold 1/2 or whole deck in your hand. Use thumb to push card off top of deck with one big push. Rotate ball with fingertips: Pick up with fingers/thumb and move as much as you can with each turn/movement (clockwise and counter-clockwise). Toss ball from one hand to the other: Toss big/high. Toss ball in the air and catch with the same hand: Toss big/high. Pick up coins and place in coin bank or container: Pick up with big, intentional movements. Do not drag coin to the edge. Pick up coins and stack one at a time: Pick up with big, intentional movements. Do not drag coin to the edge. (5-10 in a stack) Pick up 5-10 coins one at a time and hold in palm. Then, move coins from palm to fingertips one at time and place in coin bank/container. Practice writing: Slow down, write big, and focus on forming each letter. Perform "Flicks"/hand stretches (PWR! Hands): Close hands then flick out your fingers with focus on opening hands, pulling wrists back, and extending elbows like you are pushing.

## 2013-12-02 NOTE — Therapy (Signed)
Occupational Therapy Treatment  Patient Details  Name: Kyle Cameron MRN: [id number] Date of Birth: 07-21-1926  Encounter Date: 12/02/2013      OT End of Session - 12/02/13 1010    Visit Number 4   Number of Visits 17   Date for OT Re-Evaluation 01/01/14   Authorization Type 1Medicare, 2AARP   OT Start Time 0935   OT Stop Time 1015   OT Time Calculation (min) 40 min   Activity Tolerance --      Past Medical History  Diagnosis Date  . Hypertension   . High cholesterol   . Prostate atrophy   . GERD (gastroesophageal reflux disease)   . History of diverticulitis of colon   . Benign enlargement of prostate   . Hypothyroidism   . History of renal calculi   . Parkinson's disease   . Renal calculi     Past Surgical History  Procedure Laterality Date  . Cholecystectomy  02/04/2012    Procedure: LAPAROSCOPIC CHOLECYSTECTOMY WITH INTRAOPERATIVE CHOLANGIOGRAM;  Surgeon: Merrie Roof, MD;  Location: [place];  Service: General;  Laterality: N/A;  . Lithotripsy      renal calculi  . Tonsillectomy    . Cataract extraction, bilateral      There were no vitals taken for this visit.  Visit Diagnosis:  Lack of coordination  Rigidity  Generalized muscle weakness      Subjective Assessment - 12/02/13 1013    Currently in Pain? No/denies   Multiple Pain Sites No              OT Education - 12/02/13 1008    Education provided Yes   Education Details coordination HEP issued   Person(s) Educated Patient   Methods Explanation;Demonstration;Handout   Comprehension Verbalized understanding;Returned demonstration;Other (comment)  Needs reinforcement              Plan - 12/02/13 1013    Clinical Impression Statement Education provided regarding coordination HEP, min v.c. to slow down, and for performance. Handwriting activities with emphasis on larger size, avoiding crowding, min v.c, min-mod difficulty.   Rehab Potential Good   Clinical Impairments Affecting  Rehab Potential speed and timing.   OT Frequency 2x / week    Plan: Reinforce Big movements with ADLs.    Problem List Patient Active Problem List   Diagnosis Date Noted  . Abnormality of gait 03/12/2012  . Paralysis agitans 03/12/2012  . Acute cholecystitis 02/04/2012  . Hypertension 02/04/2012  . Hyperlipidemia 02/04/2012                                           Kyle Cameron, OTR/L  Kyle Cameron 12/02/2013, 1:56 PM

## 2013-12-07 ENCOUNTER — Ambulatory Visit: Payer: Medicare Other | Admitting: Physical Therapy

## 2013-12-07 ENCOUNTER — Encounter: Payer: Self-pay | Admitting: Physical Therapy

## 2013-12-07 ENCOUNTER — Encounter: Payer: Self-pay | Admitting: Occupational Therapy

## 2013-12-07 ENCOUNTER — Ambulatory Visit: Payer: Medicare Other | Admitting: Occupational Therapy

## 2013-12-07 DIAGNOSIS — R29898 Other symptoms and signs involving the musculoskeletal system: Secondary | ICD-10-CM

## 2013-12-07 DIAGNOSIS — Z5189 Encounter for other specified aftercare: Secondary | ICD-10-CM | POA: Diagnosis not present

## 2013-12-07 DIAGNOSIS — M6281 Muscle weakness (generalized): Secondary | ICD-10-CM

## 2013-12-07 DIAGNOSIS — R279 Unspecified lack of coordination: Secondary | ICD-10-CM

## 2013-12-07 DIAGNOSIS — R269 Unspecified abnormalities of gait and mobility: Secondary | ICD-10-CM

## 2013-12-07 NOTE — Therapy (Signed)
Physical Therapy Treatment  Patient Details  Name: Kyle Cameron MRN: [id number] Date of Birth: December 15, 1926  Encounter Date: 12/07/2013      PT End of Session - 12/07/13 1231    Visit Number 5   Number of Visits 9   Date for PT Re-Evaluation 01/01/14   PT Start Time 1020   PT Stop Time 1102   PT Time Calculation (min) 42 min   Activity Tolerance Patient tolerated treatment well      Past Medical History  Diagnosis Date  . Hypertension   . High cholesterol   . Prostate atrophy   . GERD (gastroesophageal reflux disease)   . History of diverticulitis of colon   . Benign enlargement of prostate   . Hypothyroidism   . History of renal calculi   . Parkinson's disease   . Renal calculi     Past Surgical History  Procedure Laterality Date  . Cholecystectomy  02/04/2012    Procedure: LAPAROSCOPIC CHOLECYSTECTOMY WITH INTRAOPERATIVE CHOLANGIOGRAM;  Surgeon: Merrie Roof, MD;  Location: [place];  Service: General;  Laterality: N/A;  . Lithotripsy      renal calculi  . Tonsillectomy    . Cataract extraction, bilateral      There were no vitals taken for this visit.  Visit Diagnosis:  Lack of coordination  Abnormality of gait  Generalized muscle weakness      Subjective Assessment - 12/07/13 1029    Symptoms No falls or medical changes   Currently in Pain? No/denies          [facility] Adult PT Treatment/Exercise - 12/07/13 1031    Transfers   Transfers Sit to Stand;Stand to Sit   Sit to Stand 7: Independent;Without upper extremity assist;From chair/3-in-1  from various height surfaces and standing on foam.    Stand to Sit 7: Independent;Without upper extremity assist;To chair/3-in-1   Ambulation/Gait   Ambulation/Gait Yes   Ambulation/Gait Assistance 7: Independent   Ambulation/Gait Assistance Details freezes at times when starting or turning   Ambulation Distance (Feet) 400 Feet   Assistive device None   Gait Pattern Decreased step length - right;Decreased  step length - left;Poor foot clearance - left;Poor foot clearance - right;Narrow base of support   Knee/Hip Exercises: Aerobic   Stationary Bike x 8 min bil UE level 4.0 on [place]   Tread Mill x 6 minutes working on heel strike, speed, step length.  Kicking blue therapy ball.   Knee/Hip Exercises: Standing   Lateral Step Up Both;15 reps;Hand Hold: 1;Step Height: 6"   Lateral Step Up Limitations needs cues for decreased speed and correct technique   Forward Step Up Both;15 reps;Hand Hold: 1;Step Height: 8";Other (comment)   Forward Step Up Limitations needs cues to slow down and perform correctly           Plan - 12/07/13 1232    Clinical Impression Statement Pt needs frequent cues to slow down and perform mobility correctly.  Increased posterior lean onto heels with repeated sit to stand.  Cues to correct.   Pt will benefit from skilled therapeutic intervention in order to improve on the following deficits Abnormal gait;Difficulty walking;Decreased balance;Decreased strength;Decreased coordination   Rehab Potential Good   PT Duration 4 weeks   PT Treatment/Interventions Therapeutic exercise;Neuromuscular re-education;Therapeutic activities;Functional mobility training;Other (comment);Patient/family education;Gait training;Balance training;ADLs/Self Care Home Management   PT Next Visit Plan Continue gait and balance with cognitive tasks.  Reinforce intensity of mobility.   Consulted and Agree with Plan of Care  Patient        Problem List Patient Active Problem List   Diagnosis Date Noted  . Abnormality of gait 03/12/2012  . Paralysis agitans 03/12/2012  . Acute cholecystitis 02/04/2012  . Hypertension 02/04/2012  . Hyperlipidemia 02/04/2012     Kyle Cameron 12/07/2013, 12:39 PM  Kyle Cameron, PTA

## 2013-12-07 NOTE — Therapy (Addendum)
Occupational Therapy Treatment  Patient Details  Name: Kyle Cameron MRN: [id number] Date of Birth: 1926-07-22  Encounter Date: 12/07/2013      OT End of Session - 12/07/13 1346    Visit Number 5  5/10-G   Number of Visits 17   Date for OT Re-Evaluation 01/01/14   Authorization Type 1Medicare, 2AARP   OT Start Time 1104   OT Stop Time 1145   OT Time Calculation (min) 41 min   Activity Tolerance Patient tolerated treatment well      Past Medical History  Diagnosis Date  . Hypertension   . High cholesterol   . Prostate atrophy   . GERD (gastroesophageal reflux disease)   . History of diverticulitis of colon   . Benign enlargement of prostate   . Hypothyroidism   . History of renal calculi   . Parkinson's disease   . Renal calculi     Past Surgical History  Procedure Laterality Date  . Cholecystectomy  02/04/2012    Procedure: LAPAROSCOPIC CHOLECYSTECTOMY WITH INTRAOPERATIVE CHOLANGIOGRAM;  Surgeon: Merrie Roof, MD;  Location: [place];  Service: General;  Laterality: N/A;  . Lithotripsy      renal calculi  . Tonsillectomy    . Cataract extraction, bilateral      There were no vitals taken for this visit.  Visit Diagnosis:  Lack of coordination  Rigidity  Generalized muscle weakness      Subjective Assessment - 12/07/13 1104    Symptoms I'm tired (from PT).  My L thumb bothers me always.  Pt denies pain, but reports that he can feel it.  I feel like my writing is a little better.   Currently in Pain? No/denies            OT Treatments/Exercises (OP) - 12/07/13 1113    ADLs   Functional Mobility Functional step and reach out/back with each UE/LE incorporating weight shift and step in prep for turn with mod cues for sequencing movement, big movements, and timing.  Pt instructed to keep feet apart and staggered to prevent/decrease freezing and reinforced turning strategy to decrease freezing.  Pt verbalized understanding, but needs min-mod cues during  activity and had freezing episode x2 between activities in functional context.   Neurological Re-education Exercises   Other Exercises 1 Reviewed coordination HEP issued last session.  Pt needed min-mod cues for timing/big movements.   Other Exercises 2 Placing small pegs in pegboard with using thumb and each finger with max cues for PWR! hands with grasp (performed with each hand).  Functional reaching with each UE in standing with trunk rotation/head turn (min cues) to toss scarves with whole hand and then each finger/thumb with mod cues initially for big movement and timing.                Plan - 12/07/13 1115    Clinical Impression Statement Pt continues to need cueing for timing of movements to improve movement quality emphasizing beginning/end of movement   Plan check STGs NEXT week, cognitive tips/compensation strategies, big movements with coordination/ADLs        Problem List Patient Active Problem List   Diagnosis Date Noted  . Abnormality of gait 03/12/2012  . Paralysis agitans 03/12/2012  . Acute cholecystitis 02/04/2012  . Hypertension 02/04/2012  . Hyperlipidemia 02/04/2012  Vianne Bulls, OTR/L [facility] [address] [place], [place]  [address] Phone:   [phone] Fax:  [phone]     [facility] 12/07/2013, 3:18 PM

## 2013-12-08 ENCOUNTER — Encounter: Payer: Self-pay | Admitting: Neurology

## 2013-12-09 ENCOUNTER — Ambulatory Visit: Payer: Medicare Other | Admitting: Physical Therapy

## 2013-12-09 ENCOUNTER — Ambulatory Visit: Payer: Medicare Other | Admitting: Occupational Therapy

## 2013-12-09 ENCOUNTER — Encounter: Payer: Self-pay | Admitting: Physical Therapy

## 2013-12-09 DIAGNOSIS — M6281 Muscle weakness (generalized): Secondary | ICD-10-CM

## 2013-12-09 DIAGNOSIS — R29898 Other symptoms and signs involving the musculoskeletal system: Secondary | ICD-10-CM

## 2013-12-09 DIAGNOSIS — Z5189 Encounter for other specified aftercare: Secondary | ICD-10-CM | POA: Diagnosis not present

## 2013-12-09 DIAGNOSIS — R269 Unspecified abnormalities of gait and mobility: Secondary | ICD-10-CM

## 2013-12-09 DIAGNOSIS — R279 Unspecified lack of coordination: Secondary | ICD-10-CM

## 2013-12-09 NOTE — Therapy (Signed)
Physical Therapy Treatment  Patient Details  Name: Kyle Cameron MRN: [id number] Date of Birth: 08-20-1926  Encounter Date: 12/09/2013      PT End of Session - 12/09/13 1616    Visit Number 6   Number of Visits 9   Date for PT Re-Evaluation 01/01/14   PT Start Time 1450   PT Stop Time 1530   PT Time Calculation (min) 40 min   Activity Tolerance Patient tolerated treatment well      Past Medical History  Diagnosis Date  . Hypertension   . High cholesterol   . Prostate atrophy   . GERD (gastroesophageal reflux disease)   . History of diverticulitis of colon   . Benign enlargement of prostate   . Hypothyroidism   . History of renal calculi   . Parkinson's disease   . Renal calculi     Past Surgical History  Procedure Laterality Date  . Cholecystectomy  02/04/2012    Procedure: LAPAROSCOPIC CHOLECYSTECTOMY WITH INTRAOPERATIVE CHOLANGIOGRAM;  Surgeon: Merrie Roof, MD;  Location: [place];  Service: General;  Laterality: N/A;  . Lithotripsy      renal calculi  . Tonsillectomy    . Cataract extraction, bilateral      There were no vitals taken for this visit.  Visit Diagnosis:  Generalized muscle weakness  Abnormality of gait  Lack of coordination  Rigidity      Subjective Assessment - 12/09/13 1456    Symptoms Pt reports fall in bathroom 2 days ago while turning.  Denies injury.   Currently in Pain? No/denies            [facility] Adult PT Treatment/Exercise - 12/09/13 1608    Knee/Hip Exercises: Standing   Lateral Step Up Both;Hand Hold: 1;Hand Hold: 2;Hand Hold: 0;Step Height: 6";Other (comment)  while standing on blue foam          Plan - 12/09/13 1617    Clinical Impression Statement continue cognitive tasks with activities.  Slow down movements with intensity.   Pt will benefit from skilled therapeutic intervention in order to improve on the following deficits Abnormal gait;Difficulty walking;Decreased balance;Decreased strength;Decreased  coordination   Rehab Potential Good   PT Frequency 2x / week   PT Duration 4 weeks   PT Treatment/Interventions Therapeutic exercise;Neuromuscular re-education;Therapeutic activities;Functional mobility training;Other (comment);Patient/family education;Gait training;Balance training;ADLs/Self Care Home Management   Consulted and Agree with Plan of Care Patient       Problem List Patient Active Problem List   Diagnosis Date Noted  . Abnormality of gait 03/12/2012  . Paralysis agitans 03/12/2012  . Acute cholecystitis 02/04/2012  . Hypertension 02/04/2012  . Hyperlipidemia 02/04/2012        LSVT [facility]) - 12/09/13 1610    Step and Reach Forward 10 reps;Other reps (comment)  on compliant and non compliant surface   Step and Reach Backward 10 reps;Other reps (comment)  on compliant and non compliant surface   Step and Reach Sideways 10 reps;Other reps (comment)  on compliant and non compliant surface   Rock and Reach Forward/Backward Other reps (comment)  varied directions on command for cognitive component          Balance Exercises - 12/09/13 1612    Balance Exercises: Standing   Tandem Stance Eyes open;Foam/compliant surface;Intermittent upper extremity support;3 reps;10 secs   Sidestepping Foam/compliant support;Upper extremity support;5 reps  5 ft x 5   Other Standing Exercises cone double taps & tipping/uprighting on compliant and non compliant surface  Overall Comments Other (comment)  Bosu both surfaces with intermittent UE support then squats       [name] 12/09/2013, 4:20 PM     [name], PTA 4:20 PM

## 2013-12-09 NOTE — Therapy (Signed)
Occupational Therapy Treatment  Patient Details  Name: Kyle Cameron MRN: [id number] Date of Birth: 1927-01-09  Encounter Date: 12/09/2013      OT End of Session - 12/09/13 1624    Visit Number 6   Number of Visits 17   Date for OT Re-Evaluation 01/01/14   Authorization Type 1Medicare, 2AARP   OT Start Time 0488   OT Stop Time 1615   OT Time Calculation (min) 43 min   Activity Tolerance Patient tolerated treatment well   Behavior During Therapy [facility] for tasks assessed/performed      Past Medical History  Diagnosis Date  . Hypertension   . High cholesterol   . Prostate atrophy   . GERD (gastroesophageal reflux disease)   . History of diverticulitis of colon   . Benign enlargement of prostate   . Hypothyroidism   . History of renal calculi   . Parkinson's disease   . Renal calculi     Past Surgical History  Procedure Laterality Date  . Cholecystectomy  02/04/2012    Procedure: LAPAROSCOPIC CHOLECYSTECTOMY WITH INTRAOPERATIVE CHOLANGIOGRAM;  Surgeon: Merrie Roof, MD;  Location: [place];  Service: General;  Laterality: N/A;  . Lithotripsy      renal calculi  . Tonsillectomy    . Cataract extraction, bilateral      There were no vitals taken for this visit.  Visit Diagnosis:  Generalized muscle weakness  Lack of coordination  Rigidity      Subjective Assessment - 12/09/13 1624    Currently in Pain? No/denies   Multiple Pain Sites No              OT Education - 12/09/13 1616    Education provided Yes   Education Details memory strategies and activities to keep thinking skills sharp   Person(s) Educated Patient   Methods Explanation;Handout   Comprehension Verbalized understanding              Problem List Patient Active Problem List   Diagnosis Date Noted  . Abnormality of gait 03/12/2012  . Paralysis agitans 03/12/2012  . Acute cholecystitis 02/04/2012  . Hypertension 02/04/2012  . Hyperlipidemia 02/04/2012     Education  provided regarding memory strategies and techniques to keep thinking skills sharp. UBE x 6 mins level 1 for conditioning, min v.c for speed. Simulated ADLs with bag exercises, (reach behind head, behind back, under foot, and crumpling in hand for simulated dressing), min v.c. Dual tasking for physical and cognitive tasks, min v.c. and mod difficulty (ambulating and tossing ball with category generation, multi directional stepping following instructions.)                                         Kyle Cameron, OTR/L [phone] Phone: [phone] 4:28 PM 12/09/2013 Kyle Cameron 12/09/2013, 4:28 PM

## 2013-12-09 NOTE — Patient Instructions (Signed)
Memory Compensation Strategies  1. Use "WARM" strategy.  W= write it down  A= associate it  R= repeat it  M= make a mental note  2.   You can keep a Social worker.  Use a 3-ring notebook with sections for the following: calendar, important names and phone numbers,  medications, doctors' names/phone numbers, lists/reminders, and a section to journal what you did  each day.   3.    Use a calendar to write appointments down.  4.    Write yourself a schedule for the day.  This can be placed on the calendar or in a separate section of the Memory Notebook.  Keeping a  regular schedule can help memory.  5.    Use medication organizer with sections for each day or morning/evening pills.  You may need help loading it  6.    Keep a basket, or pegboard by the door.  Place items that you need to take out with you in the basket or on the pegboard.  You may also want to  include a message board for reminders.  7.    Use sticky notes.  Place sticky notes with reminders in a place where the task is performed.  For example: " turn off the  stove" placed by the stove, "lock the door" placed on the door at eye level, " take your medications" on  the bathroom mirror or by the place where you normally take your medications.  8.    Use alarms/timers.  Use while cooking to remind yourself to check on food or as a reminder to take your medicine, or as a  reminder to make a call, or as a reminder to perform another task, etc.    Keeping Thinking Skills Sharp: 1. Jigsaw puzzles 2. Card/board games 3. Talking on the phone/social events 4. Lumosity.com 5. Online games 6. Word serches/crossword puzzles 7.  Logic puzzles 8. Aerobic exercise (stationary bike) 9. Eating balanced diet (fruits & veggies) 10. Drink water 11. Try something new--new recipe, hobby 12. Crafts 13. Do a variety of activities that are challenging 14. Add cognitive activities to walking/exercising (think of animal/food/city with  each letter of the alphabet, counting backwards, thinking of as many vegetables as you can, etc.).--Only do this  If safe (no freezing/falls).Performing Daily Activities with Big Movements  Pick at least one activity a day and perform with BIG, DELIBERATE movements/effort. This can make the activity easier and turn daily activities into exercise!  If you are standing during the activity, make sure to keep feet apart and stand with good/big/PWR! UP posture.    Performing Daily Activities with Big Movements  Pick at least one activity a day and perform with BIG, DELIBERATE movements/effort. This can make the activity easier and turn daily activities into exercise!  If you are standing during the activity, make sure to keep feet apart and stand with good/big/PWR! UP posture.

## 2013-12-13 ENCOUNTER — Ambulatory Visit: Payer: Medicare Other | Admitting: Occupational Therapy

## 2013-12-13 ENCOUNTER — Encounter: Payer: Self-pay | Admitting: Physical Therapy

## 2013-12-13 ENCOUNTER — Ambulatory Visit: Payer: Medicare Other | Admitting: Physical Therapy

## 2013-12-13 DIAGNOSIS — M6281 Muscle weakness (generalized): Secondary | ICD-10-CM

## 2013-12-13 DIAGNOSIS — Z5189 Encounter for other specified aftercare: Secondary | ICD-10-CM | POA: Diagnosis not present

## 2013-12-13 DIAGNOSIS — R269 Unspecified abnormalities of gait and mobility: Secondary | ICD-10-CM

## 2013-12-13 DIAGNOSIS — R29898 Other symptoms and signs involving the musculoskeletal system: Secondary | ICD-10-CM

## 2013-12-13 DIAGNOSIS — R279 Unspecified lack of coordination: Secondary | ICD-10-CM

## 2013-12-13 NOTE — Therapy (Signed)
Physical Therapy Treatment  Patient Details  Name: Kyle Cameron MRN: [id number] Date of Birth: [birth year]  Encounter Date: 12/13/2013      PT End of Session - 12/13/13 1116    Visit Number 7   Number of Visits 9   Date for PT Re-Evaluation 01/01/14   PT Start Time 1020   PT Stop Time 1100   PT Time Calculation (min) 40 min   Activity Tolerance Patient tolerated treatment well      Past Medical History  Diagnosis Date  . Hypertension   . High cholesterol   . Prostate atrophy   . GERD (gastroesophageal reflux disease)   . History of diverticulitis of colon   . Benign enlargement of prostate   . Hypothyroidism   . History of renal calculi   . Parkinson's disease   . Renal calculi     Past Surgical History  Procedure Laterality Date  . Cholecystectomy  02/04/2012    Procedure: LAPAROSCOPIC CHOLECYSTECTOMY WITH INTRAOPERATIVE CHOLANGIOGRAM;  Surgeon: Merrie Roof, MD;  Location: [place];  Service: General;  Laterality: N/A;  . Lithotripsy      renal calculi  . Tonsillectomy    . Cataract extraction, bilateral      There were no vitals taken for this visit.  Visit Diagnosis:  Abnormality of gait      Subjective Assessment - 12/13/13 1021    Symptoms Pt reports still having occasional freezing episodes with gait.   Currently in Pain? No/denies            [facility] Adult PT Treatment/Exercise - 12/13/13 1113    Ambulation/Gait   Ambulation/Gait Yes   Ambulation/Gait Assistance 7: Independent   Ambulation/Gait Assistance Details freezes with turns at times   Ambulation Distance (Feet) 500 Feet  negotiating furniture   Assistive device None   High Level Balance   High Level Balance Activities Direction changes;Turns;Sudden stops;Figure 8 turns;Weight-shifting turns;Negotitating around obstacles  carrying objects with added cognitive tasks                Plan - 12/13/13 1118    Clinical Impression Statement With focus on turning strategies, pt has  decreased freezing episodes with turns.  With added cognitive and manual tasks, pt has narrowed BOS and increased festination with turns.   Pt will benefit from skilled therapeutic intervention in order to improve on the following deficits Abnormal gait;Difficulty walking;Decreased balance;Decreased strength;Decreased coordination   Rehab Potential Good   PT Frequency 2x / week   PT Duration 4 weeks   PT Treatment/Interventions Therapeutic exercise;Neuromuscular re-education;Therapeutic activities;Functional mobility training;Other (comment);Patient/family education;Gait training;Balance training;ADLs/Self Care Home Management   PT Next Visit Plan Check goals and plan for D/C in the next 2 visits.  Discuss possibility of community Parkinson's disease exercise classes.   Consulted and Agree with Plan of Care Patient        Problem List Patient Active Problem List   Diagnosis Date Noted  . Abnormality of gait 03/12/2012  . Paralysis agitans 03/12/2012  . Acute cholecystitis 02/04/2012  . Hypertension 02/04/2012  . Hyperlipidemia 02/04/2012                                LSVT [facility]) - 12/13/13 1024    Step and Reach Forward 10 reps  solid and compliant surface   Step and Reach Backward 10 reps  solid and compliant surface   Step and Reach Sideways 10  reps  solid and compliant surface   Rock and Reach Forward/Backward Other reps (comment)  clockwise/counterclockwise turns at balance disks   [place] Other reps (comment)  Varied directions turns/weightshifts at [place] with pol                      MARRIOTT,AMY W. 12/13/2013, 11:24 AM     Mady Haagensen, PT 12/13/2013 11:27 AM Phone: [phone] Fax: [phone]

## 2013-12-13 NOTE — Therapy (Signed)
Occupational Therapy Treatment  Patient Details  Name: Kyle Cameron MRN: [id number] Date of Birth: 1926/03/17  Encounter Date: 12/13/2013      OT End of Session - 12/13/13 1145    Visit Number 7  7/10 G   Number of Visits 17   Date for OT Re-Evaluation 01/01/14   Authorization Type 1Medicare, 2AARP   OT Start Time 1102   OT Stop Time 1145   OT Time Calculation (min) 43 min   Activity Tolerance Patient tolerated treatment well   Behavior During Therapy [facility] for tasks assessed/performed      Past Medical History  Diagnosis Date  . Hypertension   . High cholesterol   . Prostate atrophy   . GERD (gastroesophageal reflux disease)   . History of diverticulitis of colon   . Benign enlargement of prostate   . Hypothyroidism   . History of renal calculi   . Parkinson's disease   . Renal calculi     Past Surgical History  Procedure Laterality Date  . Cholecystectomy  02/04/2012    Procedure: LAPAROSCOPIC CHOLECYSTECTOMY WITH INTRAOPERATIVE CHOLANGIOGRAM;  Surgeon: Merrie Roof, MD;  Location: [place];  Service: General;  Laterality: N/A;  . Lithotripsy      renal calculi  . Tonsillectomy    . Cataract extraction, bilateral      There were no vitals taken for this visit.  Visit Diagnosis:  Generalized muscle weakness  Lack of coordination  Rigidity      Subjective Assessment - 12/13/13 1103    Currently in Pain? Yes   Pain Score 1    Pain Location Back   Pain Orientation Right   Pain Descriptors / Indicators Aching   Pain Type Acute pain   Multiple Pain Sites No                OT Short Term Goals - 12/13/13 1133    OT SHORT TERM GOAL #1   Title Pt will verbalize understanding of ways to prevent future complications related to PD.   Status On-going   OT SHORT TERM GOAL #2   Title Pt will be independent with PD-specific HEP.   Status On-going   OT SHORT TERM GOAL #3   Title Pt will be able to write at least 3 sentences with at least 50%  legibility.   Status Achieved   OT SHORT TERM GOAL #4   Title Pt will verbalize understanding of cognitive compensation strategies/tips prn.   Status Achieved   OT SHORT TERM GOAL #5   Title Pt will report increased ease with pulling down shirt, performing buttons, and opening containers.   Status On-going            Plan - 12/13/13 1202    Clinical Impression Statement Simulated ADLS (with bag exercises :crumple bag, behind back, behind head, underfoot), min v.c. for timing and big movements, Dynamic step and reach with scarves to target, min v.c./ difficulty, ambulating while dual tasking to toss scarf and perform category generation, min difficulty/ v.c. Checked progess towards short term goals. Hanwriting activity with min-mod v.c. for speed and size, pt able to write 3 sentences with 50% legibility.Small peg design min v.c. for coord.   Rehab Potential Good   Clinical Impairments Affecting Rehab Potential speed and timing.   OT Frequency 2x / week   OT Duration 8 weeks   Plan education regarding big movements with ADLs/ ways to avoid future complications.   OT Home Exercise Plan reinforce  coordiantion HEP   Consulted and Agree with Plan of Care Patient        Problem List Patient Active Problem List   Diagnosis Date Noted  . Abnormality of gait 03/12/2012  . Paralysis agitans 03/12/2012  . Acute cholecystitis 02/04/2012  . Hypertension 02/04/2012  . Hyperlipidemia 02/04/2012                                             Theone Murdoch, OTR/L [phone] Phone: [phone] 12:07 PM 12/13/2013  Abner Ardis 12/13/2013, 12:06 PM

## 2013-12-14 ENCOUNTER — Ambulatory Visit: Payer: Medicare Other | Admitting: Occupational Therapy

## 2013-12-14 ENCOUNTER — Ambulatory Visit: Payer: Medicare Other | Admitting: Physical Therapy

## 2013-12-14 ENCOUNTER — Encounter: Payer: Self-pay | Admitting: Occupational Therapy

## 2013-12-14 ENCOUNTER — Encounter: Payer: Self-pay | Admitting: Physical Therapy

## 2013-12-14 ENCOUNTER — Encounter: Payer: Self-pay | Admitting: Neurology

## 2013-12-14 DIAGNOSIS — R279 Unspecified lack of coordination: Secondary | ICD-10-CM

## 2013-12-14 DIAGNOSIS — Z5189 Encounter for other specified aftercare: Secondary | ICD-10-CM | POA: Diagnosis not present

## 2013-12-14 DIAGNOSIS — R269 Unspecified abnormalities of gait and mobility: Secondary | ICD-10-CM

## 2013-12-14 DIAGNOSIS — R29898 Other symptoms and signs involving the musculoskeletal system: Secondary | ICD-10-CM

## 2013-12-14 NOTE — Patient Instructions (Signed)
Tips to reduce freezing episodes with standing or walking:  1. Stand tall with your feet wide, so that you can rock and weight shift through your hips. 2. Don't try to fight the freeze: if you begin taking slower, faster, smaller steps, STOP, get your posture tall, and RESET your posture and balance.  Take a deep breath before taking the BIG step to start again. 3. March in place, with high knee stepping, to get started walking again. 4. Use auditory cues:  Count out loud, think of a familiar tune or song or cadence, use pocket metronome, to use rhythm to get started walking again. 5. Use visual cues:  Use a line to step over, use laser pointer line to step over, (using BIG steps) to start walking again. 6. Use visual targets to keep your posture tall (look ahead and focus on an object or target at eye level). 7. As you approach where your destination with walking, count your steps out loud and/or focus on your target with your eyes until you are fully there. 8. Use appropriate assistive device, as advised by your physical therapist to assist with taking longer, consistent steps. 9.    It is important to avoid accidents which may result in broken bones.  Here are a few ideas on how to make your home safer so you will be less likely to trip or fall.  10. Use nonskid mats or non slip strips in your shower or tub, on your bathroom floor and around sinks.  If you know that you have spilled water, wipe it up! 11. In the bathroom, it is important to have properly installed grab bars on the walls or on the edge of the tub.  Towel racks are NOT strong enough for you to hold onto or to pull on for support. 12. Stairs and hallways should have enough light.  Add lamps or night lights if you need ore light. 13. It is good to have handrails on both sides of the stairs if possible.  Always fix broken handrails right away. 14. It is important to see the edges of steps.  Paint the edges of outdoor steps white so  you can see them better.  Put colored tape on the edge of inside steps. 15. Throw-rugs are dangerous because they can slide.  Removing the rugs is the best idea, but if they must stay, add adhesive carpet tape to prevent slipping. 16. Do not keep things on stairs or in the halls.  Remove small furniture that blocks the halls as it may cause you to trip.  Keep telephone and electrical cords out of the way where you walk. 17. Always were sturdy, rubber-soled shoes for good support.  Never wear just socks, especially on the stairs.  Socks may cause you to slip or fall.  Do not wear full-length housecoats as you can easily trip on the bottom.  18. Place the things you use the most on the shelves that are the easiest to reach.  If you use a stepstool, make sure it is in good condition.  If you feel unsteady, DO NOT climb, ask for help. 19. If a health professional advises you to use a cane or walker, do not be ashamed.  These items can keep you from falling and breaking your bones. 

## 2013-12-14 NOTE — Therapy (Signed)
Physical Therapy Treatment  Patient Details  Name: Kyle Cameron Age MRN: [id number] Date of Birth: 06-19-1926  Encounter Date: 12/14/2013      PT End of Session - 12/14/13 1236    Visit Number 8   Number of Visits 9   Date for PT Re-Evaluation 01/01/14   PT Start Time 1150   PT Stop Time 1230   PT Time Calculation (min) 40 min   Activity Tolerance Patient tolerated treatment well      Past Medical History  Diagnosis Date  . Hypertension   . High cholesterol   . Prostate atrophy   . GERD (gastroesophageal reflux disease)   . History of diverticulitis of colon   . Benign enlargement of prostate   . Hypothyroidism   . History of renal calculi   . Parkinson's disease   . Renal calculi     Past Surgical History  Procedure Laterality Date  . Cholecystectomy  02/04/2012    Procedure: LAPAROSCOPIC CHOLECYSTECTOMY WITH INTRAOPERATIVE CHOLANGIOGRAM;  Surgeon: Merrie Roof, MD;  Location: [place];  Service: General;  Laterality: N/A;  . Lithotripsy      renal calculi  . Tonsillectomy    . Cataract extraction, bilateral      There were no vitals taken for this visit.  Visit Diagnosis:  Abnormality of gait      Subjective Assessment - 12/14/13 1150    Symptoms Pt reports that he is not stumbling as much, being more mindful with his walking.   Currently in Pain? No/denies            [facility] Adult PT Treatment/Exercise - 12/14/13 1233    Transfers   Transfers Sit to Stand;Stand to Sit  from 22 in, 18 in, 16, 14 inches x 10 reps each   Sit to Stand 7: Independent  cues for forward lean   Stand to Sit 7: Independent   Ambulation/Gait   Ambulation/Gait Yes   Ambulation/Gait Assistance 7: Independent   Ambulation/Gait Assistance Details turns with weightshifting, negotiating around furniture, quick start/stops with added cognitive activity   Ambulation Distance (Feet) 1000 Feet  indoor surfaces   Assistive device None   Gait Pattern --  Pt has occasional  festination with turns          PT Education - 12/14/13 1232    Education provided Yes   Education Details Fall prevention, local Parkinson's disease resources, review techniques to decrease freezing episodes with gait   Person(s) Educated Patient   Methods Explanation;Handout   Comprehension Verbalized understanding            PT Long Term Goals - 12/14/13 1154    PT LONG TERM GOAL #1   Title Pt will verbalize/demonstrate understanding of fall prevention techniques within home environment.   Status Achieved   PT LONG TERM GOAL #2   Title perform at least 8 of 10 reps of sit<>stand transfers from <18 inch surfaces, with no posterior lean, for improved efficiency and safety with transfers.   Status Achieved   PT LONG TERM GOAL #4   Title Pt will verbalize understanding of at least 3 techniques to decrease freezing episodes with gait and turns.   Status Achieved   PT LONG TERM GOAL #5   Title Pt will verbalize understanding of local Parkinson's disease-related resources   Status Achieved          Plan - 12/14/13 1236    Clinical Impression Statement Pt noted to have improved ease of movement, decreased  festination of gait during turns with cognitive activities today.  Pt has met STG #1,2 4, 5.  Remaining goals to be assessed next visit.   Pt will benefit from skilled therapeutic intervention in order to improve on the following deficits Abnormal gait;Difficulty walking;Decreased balance;Decreased strength;Decreased coordination   Rehab Potential Good   PT Frequency 2x / week   PT Duration 4 weeks   PT Treatment/Interventions Therapeutic exercise;Neuromuscular re-education;Therapeutic activities;Functional mobility training;Other (comment);Patient/family education;Gait training;Balance training;ADLs/Self Care Home Management   PT Next Visit Plan Check remaining goals and d/c next visit.   Consulted and Agree with Plan of Care Patient        Problem List Patient Active  Problem List   Diagnosis Date Noted  . Abnormality of gait 03/12/2012  . Paralysis agitans 03/12/2012  . Acute cholecystitis 02/04/2012  . Hypertension 02/04/2012  . Hyperlipidemia 02/04/2012                                [facility]) - 12/13/13 1024    Step and Reach Forward 10 reps  solid and compliant surface   Step and Reach Backward 10 reps  solid and compliant surface   Step and Reach Sideways 10 reps  solid and compliant surface   Rock and Reach Forward/Backward Other reps (comment)  clockwise/counterclockwise turns at balance disks   Rock and [employer] Other reps (comment)  Varied directions turns/weightshifts at [place] with pol                      Eligh Rybacki W. 12/14/2013, 12:50 PM     Mady Haagensen, PT 12/14/2013 12:51 PM Phone: [phone] Fax: [phone]

## 2013-12-14 NOTE — Patient Instructions (Signed)

## 2013-12-14 NOTE — Therapy (Signed)
Occupational Therapy Treatment  Patient Details  Name: Kyle Cameron MRN: [id number] Date of Birth: 1926-09-07  Encounter Date: 12/14/2013      OT End of Session - 12/14/13 1238    Visit Number 8  8/10 G   Number of Visits 17   Date for OT Re-Evaluation 01/01/14   Authorization Type 1Medicare, 2AARP   OT Start Time 1104   OT Stop Time 1147   OT Time Calculation (min) 43 min   Activity Tolerance Patient tolerated treatment well      Past Medical History  Diagnosis Date  . Hypertension   . High cholesterol   . Prostate atrophy   . GERD (gastroesophageal reflux disease)   . History of diverticulitis of colon   . Benign enlargement of prostate   . Hypothyroidism   . History of renal calculi   . Parkinson's disease   . Renal calculi     Past Surgical History  Procedure Laterality Date  . Cholecystectomy  02/04/2012    Procedure: LAPAROSCOPIC CHOLECYSTECTOMY WITH INTRAOPERATIVE CHOLANGIOGRAM;  Surgeon: Merrie Roof, MD;  Location: [place];  Service: General;  Laterality: N/A;  . Lithotripsy      renal calculi  . Tonsillectomy    . Cataract extraction, bilateral      There were no vitals taken for this visit.  Visit Diagnosis:  Lack of coordination  Rigidity      Subjective Assessment - 12/14/13 1238    Symptoms I've always done things fast   Currently in Pain? No/denies            OT Treatments/Exercises (OP) - 12/14/13 0001    ADLs   Functional Mobility Weight shift forward/back with each side while tossing scarves with OT in reciprocal pattern with min-mod cues for big movements.   Home Maintenance Practiced opening containers/lids with jar and nuts/bolts with mod cues for timing/speed and big movements.   Writing Practiced writing with min cues for letter formation and speed.  Copied text x2 with approx 50% legibility first trial and 75% 2nd trial.     Neurological Re-education Exercises   Other Exercises 1 In sitting, trunk rotation with  functional reach to flip cards with focus on supination, trunk rotation, and PWR! hands with mod cues for big movements.          OT Education - 12/14/13 1110    Education provided Yes   Education Details  Big movements for ADLs, Timing issues and decreased awareness with PD, research with movement and PD, Ways to prevent future complications (use of big movements, importance of timing with movement, trial use of music/military cadance with walking, focus on big instead of fast with exercise, types of exercise)   Person(s) Educated Patient   Methods Explanation;Verbal cues;Demonstration   Comprehension Verbalized understanding;Returned demonstration;Verbal cues required  Needs reinforcement             Plan - 12/14/13 1239    Clinical Impression Statement timing deficits and decreased awareness continue to affect progress; however, writing improved today.   Plan reinforce timing with movement, practice donning/doffing jacket, review pt education prn.        Problem List Patient Active Problem List   Diagnosis Date Noted  . Abnormality of gait 03/12/2012  . Paralysis agitans 03/12/2012  . Acute cholecystitis 02/04/2012  . Hypertension 02/04/2012  . Hyperlipidemia 02/04/2012  Vianne Bulls, OTR/L 12/14/2013 12:50 PM Phone: [phone] Fax: [phone]        [facility] 12/14/2013, 12:50 PM

## 2013-12-21 ENCOUNTER — Ambulatory Visit: Payer: Medicare Other | Attending: Neurology | Admitting: Physical Therapy

## 2013-12-21 ENCOUNTER — Ambulatory Visit: Payer: Medicare Other | Admitting: Occupational Therapy

## 2013-12-21 DIAGNOSIS — G20 Parkinson's disease: Secondary | ICD-10-CM | POA: Insufficient documentation

## 2013-12-21 DIAGNOSIS — M6281 Muscle weakness (generalized): Secondary | ICD-10-CM

## 2013-12-21 DIAGNOSIS — Z5189 Encounter for other specified aftercare: Secondary | ICD-10-CM | POA: Insufficient documentation

## 2013-12-21 DIAGNOSIS — R269 Unspecified abnormalities of gait and mobility: Secondary | ICD-10-CM | POA: Insufficient documentation

## 2013-12-21 DIAGNOSIS — R29898 Other symptoms and signs involving the musculoskeletal system: Secondary | ICD-10-CM

## 2013-12-21 DIAGNOSIS — R279 Unspecified lack of coordination: Secondary | ICD-10-CM

## 2013-12-21 NOTE — Therapy (Signed)
Occupational Therapy Treatment  Patient Details  Name: Kyle Cameron MRN: [id number] Date of Birth: 1926-12-25  Encounter Date: 12/21/2013      OT End of Session - 12/21/13 1244    Visit Number 9   Number of Visits 17   Date for OT Re-Evaluation 01/01/14   Authorization Type 1Medicare, 2AARP   OT Start Time 1150   OT Stop Time 1228   OT Time Calculation (min) 38 min   Activity Tolerance Patient tolerated treatment well      Past Medical History  Diagnosis Date  . Hypertension   . High cholesterol   . Prostate atrophy   . GERD (gastroesophageal reflux disease)   . History of diverticulitis of colon   . Benign enlargement of prostate   . Hypothyroidism   . History of renal calculi   . Parkinson's disease   . Renal calculi     Past Surgical History  Procedure Laterality Date  . Cholecystectomy  02/04/2012    Procedure: LAPAROSCOPIC CHOLECYSTECTOMY WITH INTRAOPERATIVE CHOLANGIOGRAM;  Surgeon: Merrie Roof, MD;  Location: [place];  Service: General;  Laterality: N/A;  . Lithotripsy      renal calculi  . Tonsillectomy    . Cataract extraction, bilateral      There were no vitals taken for this visit.  Visit Diagnosis:  Lack of coordination  Rigidity  Generalized muscle weakness      Subjective Assessment - 12/21/13 1231    Symptoms Pt reports that he feels comfortable with education and is ready to discharge OT today.   Currently in Pain? No/denies            OT Treatments/Exercises (OP) - 12/21/13 0001    ADLs   UB Dressing Pt instructed in adaptive technique for donning/doffing jacket.  Practiced with gown with big movements with mod cues.  Pt verballized understanding.     Writing Writing with min cues to uses strategies with approx 65-75% legibility   ADL Comments Checked goals and discussed progress.            OT Education - 12/21/13 1231    Education provided Yes   Education Details Reveiwed community fitness info (provided by PT) and  PD-related community resources (POP, therapy screens), reviewed strategies for ADLs (big movements), reviewed HEP (verbally), reviewed ways to prevent future complications   Person(s) Educated Patient   Methods Explanation   Comprehension Verbalized understanding          OT Short Term Goals - 12/21/13 1152    OT SHORT TERM GOAL #1   Title Pt will verbalize understanding of ways to prevent future complications related to PD.   Status Achieved   OT SHORT TERM GOAL #2   Title Pt will be independent with PD-specific HEP.   Status Achieved   OT SHORT TERM GOAL #5   Title Pt will report increased ease with pulling down shirt, performing buttons, and opening containers.   Status Achieved          OT Long Term Goals - 12/21/13 1203    OT LONG TERM GOAL #1   Title Pt will verbalize understanidng of adaptive strategies for ADLs/IADLs prn to increase ease/safety.   Status Partially Met  verbalized understanding, but needs cues to utilize consistently   OT LONG TERM GOAL #2   Title Pt will verbalize understanding of appropriate community resources related to PD.   Status Achieved   OT LONG TERM GOAL #3   Title Pt  will be able to write at least 3 sentences with at least 75% legibility.   Status Not Met  approx 65-75% legibility   OT LONG TERM GOAL #4   Title Pt will improve balance for ADLs/IADLs as shown by improving standing functional reach by at least 1 inch bilaterally without step.   Status Not Met  R-9in, L-9.5in   OT LONG TERM GOAL #5   Title Pt will improve RUE functional reaching/coordination for ADLs as shown by improving score on box and blocks test by at least 5.   Status Not Met  46 blocks, no improvement   OT LONG TERM GOAL #6   Title Pt will improve ability to don/doff jacket as shown by improving time on PPT#4 by at least 5sec.   Status Not Met  45.22sec,  Instructed in adaptive techniques.  Performance affected by impulsivity.          Plan - 01/10/14 1245     Clinical Impression Statement Pt verbalized understanding of all education provided and reports desire to discharge OT today.   Plan d/c OT          G-Codes - 01-10-2014 1248    Functional Assessment Tool Used 65-75% writing legibility, independent with HEP   Functional Limitation Self care   Self Care Goal Status (P2951) At least 20 percent but less than 40 percent impaired, limited or restricted   Self Care Discharge Status [id number]) At least 20 percent but less than 40 percent impaired, limited or restricted      Problem List Patient Active Problem List   Diagnosis Date Noted  . Abnormality of gait 03/12/2012  . Paralysis agitans 03/12/2012  . Acute cholecystitis 02/04/2012  . Hypertension 02/04/2012  . Hyperlipidemia 02/04/2012         OCCUPATIONAL THERAPY DISCHARGE SUMMARY  Visits from Start of Care: 9    Remaining deficits: Timing deficits/impulsivity, decreased coordination, bradykinesia, rigidity, decreased balance for ADLs, freezing.   Education / Equipment: Pt instructed in the following:  PD-specific HEP, ways to prevent future complication, strategies for ADLs, appropriate community resources.  Pt verbalized understanding of all education provided.  Plan: Patient agrees to discharge.  Patient goals were partially met. Patient is being discharged due to the patient's request.  ?????    Pt met all STGs, but progress toward LTGs was affected by impulsivity, movement timing difficulties.  Pt would benefit from re-evaluation/occupational therapy screen in approx 6 months to assess for need for further therapy/functional changes due to progressive nature of diagnosis.                                     [facility] 10-Jan-2014, 12:50 PM

## 2013-12-22 NOTE — Therapy (Signed)
[facility] [address] [place], [place], [address] Phone: [phone]   Fax:  [phone]  Physical Therapy Treatment  Patient Details  Name: Kyle Cameron MRN: [id number] Date of Birth: 09/25/1926  Encounter Date: 12/21/2013      PT End of Session - 12/22/13 0749    Visit Number 9  G9   Number of Visits 9   PT Start Time 1106   PT Stop Time 1146   PT Time Calculation (min) 40 min   Activity Tolerance Patient tolerated treatment well      Past Medical History  Diagnosis Date  . Hypertension   . High cholesterol   . Prostate atrophy   . GERD (gastroesophageal reflux disease)   . History of diverticulitis of colon   . Benign enlargement of prostate   . Hypothyroidism   . History of renal calculi   . Parkinson's disease   . Renal calculi     Past Surgical History  Procedure Laterality Date  . Cholecystectomy  02/04/2012    Procedure: LAPAROSCOPIC CHOLECYSTECTOMY WITH INTRAOPERATIVE CHOLANGIOGRAM;  Surgeon: Merrie Roof, MD;  Location: [place];  Service: General;  Laterality: N/A;  . Lithotripsy      renal calculi  . Tonsillectomy    . Cataract extraction, bilateral      There were no vitals taken for this visit.  Visit Diagnosis:  Abnormality of gait      Subjective Assessment - 12/21/13 1108    Symptoms Stood a long time yesterday; had a lot of leg cramping last night, but better today.  No falls, but feels unsteady on his feet when tired.   Currently in Pain? No/denies          [facility] PT Assessment - 12/21/13 1139    Functional Gait  Assessment   Gait assessed  (p) Yes   Gait Level Surface (p) Walks 20 ft in less than 7 sec but greater than 5.5 sec, uses assistive device, slower speed, mild gait deviations, or deviates 6-10 in outside of the 12 in walkway width.   Change in Gait Speed (p) Able to smoothly change walking speed without loss of balance or gait deviation. Deviate no more than 6 in outside  of the 12 in walkway width.   Gait with Horizontal Head Turns (p) Performs head turns smoothly with no change in gait. Deviates no more than 6 in outside 12 in walkway width   Gait with Vertical Head Turns (p) Performs head turns with no change in gait. Deviates no more than 6 in outside 12 in walkway width.   Gait and Pivot Turn (p) Turns slowly, requires verbal cueing, or requires several small steps to catch balance following turn and stop   Step Over Obstacle (p) Is able to step over one shoe box (4.5 in total height) without changing gait speed. No evidence of imbalance.   Gait with Narrow Base of Support (p) Ambulates less than 4 steps heel to toe or cannot perform without assistance.   Gait with Eyes Closed (p) Walks 20 ft, slow speed, abnormal gait pattern, evidence for imbalance, deviates 10-15 in outside 12 in walkway width. Requires more than 9 sec to ambulate 20 ft.   Ambulating Backwards (p) Walks 20 ft, no assistive devices, good speed, no evidence for imbalance, normal gait   Steps (p) Alternating feet, must use rail.   Total Score (p) 20     Functional Gait Assessment 20/30, improved from 15/30.  PT Education - 12/21/13 1143    Education provided Yes   Education Details Reviewed HEP and importance of frequency of performing HEP; reviewed community fitness information previously provided to patient.  Discussed progress towards goals, plan for discharge, and discussed return screen to physical therapy in 6 months   Person(s) Educated Patient   Methods Explanation   Comprehension Verbalized understanding            PT Long Term Goals - 12/21/13 1109    PT LONG TERM GOAL #1   Title Pt will verbalize/demonstrate understanding of fall prevention techniques within home environment.   Status New   PT LONG TERM GOAL #2   Title perform at least 8 of 10 reps of sit<>stand transfers from <18 inch surfaces, with no posterior lean, for improved efficiency and safety with  transfers.   Status Achieved   PT LONG TERM GOAL #3   Title Pt will improve Functional Gait Assessment to at least 20/30 for decreased fall risk.   Status Achieved   PT LONG TERM GOAL #4   Title Pt will verbalize understanding of at least 3 techniques to decrease freezing episodes with gait and turns.   Status Achieved   PT LONG TERM GOAL #5   Title Pt will verbalize understanding of local Parkinson's disease-related resources   Status Achieved   PT LONG TERM GOAL #6   Title Pt will verbalize plan for continued community fitness upon D/C from PT.   Status Achieved   PT LONG TERM GOAL #7   Title Pt will be independent with HEP to address PD specific deficits.   Status Achieved          Plan - 2014/01/09 0749    Clinical Impression Statement Pt's goals have been assessed in the past several visits, with pt meeting all LTGs, #1-7.  Pt has been given HEP and ways to progress.  Pt has been provided community [employer] and  community fitness information.  Pt is apropriate for D/C at this time.   PT Next Visit Plan Discharge PT this visit.   Consulted and Agree with Plan of Care Patient          G-Codes - 01-09-14 0752    Functional Assessment Tool Used Functional Gait assessment (20/30); one fall reported during course of therapy   Functional Limitation Mobility: Walking and moving around   Mobility: Walking and Moving Around Goal Status [phone]) At least 40 percent but less than 60 percent impaired, limited or restricted   Mobility: Walking and Moving Around Discharge Status [phone]) At least 20 percent but less than 40 percent impaired, limited or restricted                    [facility]) - 12/21/13 1112    PWR! exercises Moves in sitting;Moves in standing   PWR! Up 10   PWR! Rock 10   PWR! Twist 10   PWR Step step x 10   Basic 4 Flow 2  Explained ways to progress HEP   PWR! Up 10   PWR! Rock 10   PWR! Twist 10   PWR! Step 10                  Problem List Patient Active Problem List   Diagnosis Date Noted  . Abnormality of gait 03/12/2012  . Paralysis agitans 03/12/2012  . Acute cholecystitis 02/04/2012  . Hypertension 02/04/2012  . Hyperlipidemia 02/04/2012    PHYSICAL THERAPY  DISCHARGE SUMMARY  Visits from Start of Care: 9 Pt seen one visit outside plan of care, for checking remaining goals.  Dr. Jannifer Franklin, please sign recert for this visit. Remaining deficits: Freezing episodes with gait and turns   Education / Equipment: Pt has been educated in fall prevention, HEP, community fitness, with pt verbalizing/demonstrating understanding.  Plan: Patient agrees to discharge.  Patient goals were partially met. Patient is being discharged due to meeting the stated rehab goals.  ?????      Carrigan Delafuente W. 12/22/2013, 11:20 AM  Mady Haagensen, PT 12/22/2013 11:20 AM Phone: [phone] Fax: [phone]

## 2013-12-23 ENCOUNTER — Ambulatory Visit: Payer: Medicare Other | Admitting: Physical Therapy

## 2013-12-23 ENCOUNTER — Encounter: Payer: Medicare Other | Admitting: Occupational Therapy

## 2013-12-30 ENCOUNTER — Telehealth: Payer: Self-pay | Admitting: Neurology

## 2013-12-30 NOTE — Telephone Encounter (Signed)
Pt is in [place] and had a stroke on 12/27/13.  He will be flying back to [facility] tomorrow morning and going straight to [employer] [facility].  He needs in patient rehab. The records have already been forwarded to [facility]. Wants to know if Dr. Jannifer Franklin needs to do anything as far as this.  Please call Remo Lipps back and advise.

## 2013-12-30 NOTE — Telephone Encounter (Signed)
Corinda Gubler, POA calling back and stated [facility] wants to discharge patient due to patient doing so well. Unfortunately, according to POA he's not ready to return home due to house is not set up for patient's immediate needs.  [name]'s flying in from [facility] tomorrow am and will not arrive until 5:15 pm.  Please return call to [phone] and advise.

## 2013-12-30 NOTE — Telephone Encounter (Signed)
I called and I talk with the son. The patient will be coming back in from [facility] tomorrow. They could not get him into inpatient rehabilitation, they felt he was doing too well following the stroke. He is ambulatory with a walker. They will get home health physical and occupational therapy. He will then transition to outpatient therapy. The patient had a left ventricular clot, he currently is on Coumadin.

## 2014-01-06 ENCOUNTER — Ambulatory Visit (HOSPITAL_COMMUNITY): Payer: Medicare Other | Attending: Internal Medicine

## 2014-01-06 ENCOUNTER — Other Ambulatory Visit (HOSPITAL_COMMUNITY): Payer: Self-pay | Admitting: Internal Medicine

## 2014-01-06 DIAGNOSIS — I10 Essential (primary) hypertension: Secondary | ICD-10-CM | POA: Insufficient documentation

## 2014-01-06 DIAGNOSIS — I340 Nonrheumatic mitral (valve) insufficiency: Secondary | ICD-10-CM | POA: Diagnosis not present

## 2014-01-06 DIAGNOSIS — Z87891 Personal history of nicotine dependence: Secondary | ICD-10-CM | POA: Insufficient documentation

## 2014-01-06 DIAGNOSIS — I679 Cerebrovascular disease, unspecified: Secondary | ICD-10-CM

## 2014-01-06 DIAGNOSIS — I071 Rheumatic tricuspid insufficiency: Secondary | ICD-10-CM | POA: Insufficient documentation

## 2014-01-06 DIAGNOSIS — I272 Other secondary pulmonary hypertension: Secondary | ICD-10-CM | POA: Diagnosis not present

## 2014-01-06 DIAGNOSIS — G20 Parkinson's disease: Secondary | ICD-10-CM | POA: Diagnosis not present

## 2014-01-06 DIAGNOSIS — E785 Hyperlipidemia, unspecified: Secondary | ICD-10-CM | POA: Diagnosis not present

## 2014-01-06 NOTE — Progress Notes (Signed)
2D Echo completed. 01/06/2014

## 2014-01-17 ENCOUNTER — Ambulatory Visit: Payer: Medicare Other | Admitting: Neurology

## 2014-01-18 ENCOUNTER — Inpatient Hospital Stay (HOSPITAL_COMMUNITY): Payer: Medicare Other

## 2014-01-18 ENCOUNTER — Emergency Department (HOSPITAL_COMMUNITY): Payer: Medicare Other

## 2014-01-18 ENCOUNTER — Inpatient Hospital Stay (HOSPITAL_COMMUNITY)
Admission: EM | Admit: 2014-01-18 | Discharge: 2014-01-19 | DRG: 065 | Disposition: A | Discharge status: Home / Self Care | Payer: Medicare Other | Attending: Internal Medicine | Admitting: Internal Medicine

## 2014-01-18 ENCOUNTER — Other Ambulatory Visit: Payer: Self-pay

## 2014-01-18 ENCOUNTER — Encounter (HOSPITAL_COMMUNITY): Payer: Self-pay

## 2014-01-18 DIAGNOSIS — R4781 Slurred speech: Secondary | ICD-10-CM | POA: Insufficient documentation

## 2014-01-18 DIAGNOSIS — E876 Hypokalemia: Secondary | ICD-10-CM | POA: Diagnosis not present

## 2014-01-18 DIAGNOSIS — I639 Cerebral infarction, unspecified: Secondary | ICD-10-CM | POA: Diagnosis present

## 2014-01-18 DIAGNOSIS — Z87442 Personal history of urinary calculi: Secondary | ICD-10-CM

## 2014-01-18 DIAGNOSIS — R270 Ataxia, unspecified: Secondary | ICD-10-CM

## 2014-01-18 DIAGNOSIS — Z9049 Acquired absence of other specified parts of digestive tract: Secondary | ICD-10-CM | POA: Diagnosis present

## 2014-01-18 DIAGNOSIS — R471 Dysarthria and anarthria: Secondary | ICD-10-CM

## 2014-01-18 DIAGNOSIS — Z139 Encounter for screening, unspecified: Secondary | ICD-10-CM

## 2014-01-18 DIAGNOSIS — N400 Enlarged prostate without lower urinary tract symptoms: Secondary | ICD-10-CM | POA: Diagnosis present

## 2014-01-18 DIAGNOSIS — I513 Intracardiac thrombosis, not elsewhere classified: Secondary | ICD-10-CM | POA: Insufficient documentation

## 2014-01-18 DIAGNOSIS — Z87891 Personal history of nicotine dependence: Secondary | ICD-10-CM | POA: Diagnosis not present

## 2014-01-18 DIAGNOSIS — K219 Gastro-esophageal reflux disease without esophagitis: Secondary | ICD-10-CM | POA: Diagnosis present

## 2014-01-18 DIAGNOSIS — E039 Hypothyroidism, unspecified: Secondary | ICD-10-CM | POA: Diagnosis present

## 2014-01-18 DIAGNOSIS — R4701 Aphasia: Secondary | ICD-10-CM | POA: Diagnosis present

## 2014-01-18 DIAGNOSIS — R269 Unspecified abnormalities of gait and mobility: Secondary | ICD-10-CM

## 2014-01-18 DIAGNOSIS — Z66 Do not resuscitate: Secondary | ICD-10-CM | POA: Diagnosis present

## 2014-01-18 DIAGNOSIS — N179 Acute kidney failure, unspecified: Secondary | ICD-10-CM | POA: Diagnosis present

## 2014-01-18 DIAGNOSIS — R2689 Other abnormalities of gait and mobility: Secondary | ICD-10-CM | POA: Diagnosis present

## 2014-01-18 DIAGNOSIS — R531 Weakness: Secondary | ICD-10-CM | POA: Diagnosis not present

## 2014-01-18 DIAGNOSIS — E860 Dehydration: Secondary | ICD-10-CM | POA: Diagnosis present

## 2014-01-18 DIAGNOSIS — Z7901 Long term (current) use of anticoagulants: Secondary | ICD-10-CM

## 2014-01-18 DIAGNOSIS — I272 Other secondary pulmonary hypertension: Secondary | ICD-10-CM | POA: Diagnosis present

## 2014-01-18 DIAGNOSIS — Z9842 Cataract extraction status, left eye: Secondary | ICD-10-CM | POA: Diagnosis not present

## 2014-01-18 DIAGNOSIS — E785 Hyperlipidemia, unspecified: Secondary | ICD-10-CM | POA: Diagnosis present

## 2014-01-18 DIAGNOSIS — I6300 Cerebral infarction due to thrombosis of unspecified precerebral artery: Secondary | ICD-10-CM

## 2014-01-18 DIAGNOSIS — Z9841 Cataract extraction status, right eye: Secondary | ICD-10-CM

## 2014-01-18 DIAGNOSIS — R059 Cough, unspecified: Secondary | ICD-10-CM

## 2014-01-18 DIAGNOSIS — G8191 Hemiplegia, unspecified affecting right dominant side: Secondary | ICD-10-CM | POA: Diagnosis present

## 2014-01-18 DIAGNOSIS — I10 Essential (primary) hypertension: Secondary | ICD-10-CM | POA: Diagnosis present

## 2014-01-18 DIAGNOSIS — G20 Parkinson's disease: Secondary | ICD-10-CM | POA: Diagnosis present

## 2014-01-18 DIAGNOSIS — I459 Conduction disorder, unspecified: Secondary | ICD-10-CM | POA: Diagnosis present

## 2014-01-18 DIAGNOSIS — R05 Cough: Secondary | ICD-10-CM

## 2014-01-18 HISTORY — DX: Cerebral infarction, unspecified: I63.9

## 2014-01-18 LAB — URINALYSIS, ROUTINE W REFLEX MICROSCOPIC
Bilirubin Urine: NEGATIVE
GLUCOSE, UA: NEGATIVE mg/dL
Hgb urine dipstick: NEGATIVE
Ketones, ur: NEGATIVE mg/dL
LEUKOCYTES UA: NEGATIVE
NITRITE: NEGATIVE
PH: 5.5 (ref 5.0–8.0)
Protein, ur: NEGATIVE mg/dL
SPECIFIC GRAVITY, URINE: 1.02 (ref 1.005–1.030)
Urobilinogen, UA: 0.2 mg/dL (ref 0.0–1.0)

## 2014-01-18 LAB — RAPID URINE DRUG SCREEN, HOSP PERFORMED
Amphetamines: NOT DETECTED
Barbiturates: NOT DETECTED
Benzodiazepines: NOT DETECTED
Cocaine: NOT DETECTED
OPIATES: NOT DETECTED
Tetrahydrocannabinol: NOT DETECTED

## 2014-01-18 LAB — APTT: aPTT: 33 seconds (ref 24–37)

## 2014-01-18 LAB — TSH: TSH: 1.53 u[IU]/mL (ref 0.350–4.500)

## 2014-01-18 LAB — CBC
HEMATOCRIT: 45.8 % (ref 39.0–52.0)
HEMOGLOBIN: 14.8 g/dL (ref 13.0–17.0)
MCH: 28.7 pg (ref 26.0–34.0)
MCHC: 32.3 g/dL (ref 30.0–36.0)
MCV: 88.9 fL (ref 78.0–100.0)
Platelets: 399 10*3/uL (ref 150–400)
RBC: 5.15 MIL/uL (ref 4.22–5.81)
RDW: 13.2 % (ref 11.5–15.5)
WBC: 7.9 10*3/uL (ref 4.0–10.5)

## 2014-01-18 LAB — PROTIME-INR
INR: 1.47 (ref 0.00–1.49)
PROTHROMBIN TIME: 18 s — AB (ref 11.6–15.2)

## 2014-01-18 LAB — COMPREHENSIVE METABOLIC PANEL
ALT: 33 U/L (ref 0–53)
AST: 25 U/L (ref 0–37)
Albumin: 3.6 g/dL (ref 3.5–5.2)
Alkaline Phosphatase: 43 U/L (ref 39–117)
Anion gap: 9 (ref 5–15)
BUN: 30 mg/dL — ABNORMAL HIGH (ref 6–23)
CALCIUM: 9.1 mg/dL (ref 8.4–10.5)
CO2: 29 mmol/L (ref 19–32)
CREATININE: 1.5 mg/dL — AB (ref 0.50–1.35)
Chloride: 101 mEq/L (ref 96–112)
GFR, EST AFRICAN AMERICAN: 47 mL/min — AB (ref >90)
GFR, EST NON AFRICAN AMERICAN: 41 mL/min — AB (ref >90)
GLUCOSE: 102 mg/dL — AB (ref 70–99)
Potassium: 3.5 mmol/L (ref 3.5–5.1)
Sodium: 139 mmol/L (ref 135–145)
Total Bilirubin: 0.7 mg/dL (ref 0.3–1.2)
Total Protein: 6.3 g/dL (ref 6.0–8.3)

## 2014-01-18 LAB — I-STAT TROPONIN, ED: Troponin i, poc: 0.03 ng/mL (ref 0.00–0.08)

## 2014-01-18 MED ORDER — CARBIDOPA-LEVODOPA 25-100 MG PO TABS
0.5000 | ORAL_TABLET | Freq: Three times a day (TID) | ORAL | Status: DC
  Administered 2014-01-18 – 2014-01-19 (×2): 0.5 via ORAL
  Filled 2014-01-18 (×2): qty 1
  Filled 2014-01-18 (×2): qty 0.5

## 2014-01-18 MED ORDER — POLYETHYLENE GLYCOL 3350 17 G PO PACK
17.0000 g | PACK | Freq: Every day | ORAL | Status: DC | PRN
Start: 2014-01-18 — End: 2014-01-19

## 2014-01-18 MED ORDER — ENOXAPARIN SODIUM 80 MG/0.8ML ~~LOC~~ SOLN
70.0000 mg | Freq: Two times a day (BID) | SUBCUTANEOUS | Status: DC
  Filled 2014-01-18 (×2): qty 0.8

## 2014-01-18 MED ORDER — SELEGILINE HCL 5 MG PO TABS
5.0000 mg | ORAL_TABLET | Freq: Two times a day (BID) | ORAL | Status: DC
  Administered 2014-01-18 – 2014-01-19 (×2): 5 mg via ORAL
  Filled 2014-01-18 (×4): qty 1

## 2014-01-18 MED ORDER — STROKE: EARLY STAGES OF RECOVERY BOOK
Freq: Once | Status: AC
  Administered 2014-01-18: 18:00:00
  Filled 2014-01-18: qty 1

## 2014-01-18 MED ORDER — WARFARIN SODIUM 6 MG PO TABS
6.0000 mg | ORAL_TABLET | Freq: Once | ORAL | Status: AC
  Administered 2014-01-18: 6 mg via ORAL
  Filled 2014-01-18 (×2): qty 1

## 2014-01-18 MED ORDER — SODIUM CHLORIDE 0.9 % IV SOLN
INTRAVENOUS | Status: AC
  Administered 2014-01-18: 18:00:00 via INTRAVENOUS

## 2014-01-18 MED ORDER — ONDANSETRON HCL 4 MG/2ML IJ SOLN: 4.0000 mg | Freq: Four times a day (QID) | INTRAMUSCULAR | Status: DC | PRN

## 2014-01-18 MED ORDER — SODIUM CHLORIDE 0.9 % IJ SOLN
3.0000 mL | Freq: Two times a day (BID) | INTRAMUSCULAR | Status: DC
  Administered 2014-01-18 (×2): 3 mL via INTRAVENOUS

## 2014-01-18 MED ORDER — LEVOTHYROXINE SODIUM 50 MCG PO TABS
50.0000 ug | ORAL_TABLET | Freq: Every day | ORAL | Status: DC
  Administered 2014-01-18 – 2014-01-19 (×2): 50 ug via ORAL
  Filled 2014-01-18 (×2): qty 1

## 2014-01-18 MED ORDER — SELEGILINE HCL 5 MG PO TABS
5.0000 mg | ORAL_TABLET | Freq: Two times a day (BID) | ORAL | Status: DC
  Filled 2014-01-18: qty 1

## 2014-01-18 MED ORDER — GUAIFENESIN-DM 100-10 MG/5ML PO SYRP: 5.0000 mL | ORAL_SOLUTION | ORAL | Status: DC | PRN

## 2014-01-18 MED ORDER — DUTASTERIDE 0.5 MG PO CAPS
0.5000 mg | ORAL_CAPSULE | ORAL | Status: DC
  Administered 2014-01-18: 0.5 mg via ORAL
  Filled 2014-01-18 (×3): qty 1

## 2014-01-18 MED ORDER — HEPARIN (PORCINE) IN NACL 100-0.45 UNIT/ML-% IJ SOLN
1000.0000 [IU]/h | INTRAMUSCULAR | Status: DC
  Administered 2014-01-18: 900 [IU]/h via INTRAVENOUS
  Filled 2014-01-18: qty 250

## 2014-01-18 MED ORDER — ONDANSETRON HCL 4 MG PO TABS: 4.0000 mg | ORAL_TABLET | Freq: Four times a day (QID) | ORAL | Status: DC | PRN

## 2014-01-18 MED ORDER — HYDROCODONE-ACETAMINOPHEN 5-325 MG PO TABS: 1.0000 | ORAL_TABLET | ORAL | Status: DC | PRN

## 2014-01-18 MED ORDER — CARBIDOPA-LEVODOPA 25-100 MG PO TABS: 0.5000 | ORAL_TABLET | Freq: Three times a day (TID) | ORAL | Status: DC

## 2014-01-18 MED ORDER — ATORVASTATIN CALCIUM 10 MG PO TABS
20.0000 mg | ORAL_TABLET | Freq: Every day | ORAL | Status: DC
  Administered 2014-01-18 – 2014-01-19 (×2): 20 mg via ORAL
  Filled 2014-01-18 (×2): qty 2

## 2014-01-18 MED ORDER — ALUM & MAG HYDROXIDE-SIMETH 200-200-20 MG/5ML PO SUSP: 30.0000 mL | Freq: Four times a day (QID) | ORAL | Status: DC | PRN

## 2014-01-18 MED ORDER — WARFARIN - PHARMACIST DOSING INPATIENT: Freq: Every day | Status: DC

## 2014-01-18 MED ORDER — EZETIMIBE 10 MG PO TABS
10.0000 mg | ORAL_TABLET | Freq: Every day | ORAL | Status: DC
  Administered 2014-01-18 – 2014-01-19 (×2): 10 mg via ORAL
  Filled 2014-01-18 (×2): qty 1

## 2014-01-18 NOTE — ED Notes (Signed)
MD at bedside. 

## 2014-01-18 NOTE — ED Provider Notes (Signed)
CSN: [id number]     Arrival date & time 01/18/14  0350 History   First MD Initiated Contact with Patient 01/18/14 [phone]     Chief Complaint  Patient presents with  . Cerebrovascular Accident     (Consider location/radiation/quality/duration/timing/severity/associated sxs/prior Treatment) Patient is a 80 y.o. male presenting with neurologic complaint. The history is provided by the patient and a relative.  Neurologic Problem This is a new problem. Episode onset: noticed upon awakening. The problem occurs constantly. The problem has not changed since onset.Nothing aggravates the symptoms. Nothing relieves the symptoms. He has tried nothing for the symptoms. The treatment provided no relief.    Past Medical History  Diagnosis Date  . Hypertension   . High cholesterol   . Prostate atrophy   . GERD (gastroesophageal reflux disease)   . History of diverticulitis of colon   . Benign enlargement of prostate   . Hypothyroidism   . History of renal calculi   . Parkinson's disease   . Renal calculi    Past Surgical History  Procedure Laterality Date  . Cholecystectomy  02/04/2012    Procedure: LAPAROSCOPIC CHOLECYSTECTOMY WITH INTRAOPERATIVE CHOLANGIOGRAM;  Surgeon: Merrie Roof, MD;  Location: [facility];  Service: General;  Laterality: N/A;  . Lithotripsy      renal calculi  . Tonsillectomy    . Cataract extraction, bilateral     Family History  Problem Relation Age of Onset  . Parkinsonism Mother   . Heart disease Father   . Esophageal cancer Sister    History  Substance Use Topics  . Smoking status: Former Research scientist (life sciences)  . Smokeless tobacco: Never Used  . Alcohol Use: Yes     Comment: Consumes 2 glasses of wine daily    Review of Systems  Constitutional: Negative for fever.  HENT: Negative for drooling and rhinorrhea.   Eyes: Negative for pain.  Respiratory: Negative for cough.   Cardiovascular: Negative for leg swelling.  Gastrointestinal: Negative for nausea, vomiting and  diarrhea.  Genitourinary: Negative for dysuria and hematuria.  Musculoskeletal: Negative for gait problem and neck pain.  Skin: Negative for color change.  Neurological: Negative for numbness.       Imbalance  Hematological: Negative for adenopathy.  Psychiatric/Behavioral: Negative for behavioral problems.  All other systems reviewed and are negative.     Allergies  Review of patient's allergies indicates no known allergies.  Home Medications   Prior to Admission medications   Medication Sig Start Date End Date Taking? Authorizing Provider  acetaminophen (TYLENOL) 500 MG tablet Take 500 mg by mouth every 8 (eight) hours as needed for mild pain or moderate pain.   Yes Historical Provider, MD  atorvastatin (LIPITOR) 20 MG tablet Take 20 mg by mouth daily.   Yes Historical Provider, MD  carbidopa-levodopa (SINEMET IR) 25-100 MG per tablet TAKE HALF TABLET BY MOUTH THREE TIMES DAILY  10/04/13  Yes Kathrynn Ducking, MD  cholecalciferol (VITAMIN D) 1000 UNITS tablet Take 1,000 Units by mouth daily.   Yes Historical Provider, MD  dutasteride (AVODART) 0.5 MG capsule Take 0.5 mg by mouth every other day.   Yes Historical Provider, MD  enoxaparin (LOVENOX) 80 MG/0.8ML injection Inject 70 mg into the skin every 12 (twelve) hours.   Yes Historical Provider, MD  ezetimibe (ZETIA) 10 MG tablet Take 10 mg by mouth daily.   Yes Historical Provider, MD  levothyroxine (SYNTHROID, LEVOTHROID) 50 MCG tablet Take 50 mcg by mouth daily before breakfast.   Yes Historical  Provider, MD  losartan-hydrochlorothiazide (HYZAAR) 100-25 MG per tablet Take 1 tablet by mouth daily.   Yes Historical Provider, MD  Multiple Vitamin (MULTIVITAMIN WITH MINERALS) TABS Take 1 tablet by mouth daily. Centrum 50 plus   Yes Historical Provider, MD  selegiline (ELDEPRYL) 5 MG tablet TAKE ONE TABLET TWICE DAILY WITH MEALS. TAKE WITH BREAKFAST AND LUNCH. 08/25/13  Yes Kathrynn Ducking, MD  warfarin (COUMADIN) 5 MG tablet Take 5 mg  by mouth daily.   Yes Historical Provider, MD   BP 147/74 mmHg  Pulse 81  Temp(Src) 97.7 F (36.5 C) (Oral)  Resp 16  Ht 5\' 6"  (1.676 m)  Wt 157 lb (71.215 kg)  BMI 25.35 kg/m2  SpO2 100% Physical Exam  Constitutional: He is oriented to person, place, and time. He appears well-developed and well-nourished.  HENT:  Head: Normocephalic and atraumatic.  Right Ear: External ear normal.  Left Ear: External ear normal.  Nose: Nose normal.  Mouth/Throat: Oropharynx is clear and moist. No oropharyngeal exudate.  Eyes: Conjunctivae and EOM are normal. Pupils are equal, round, and reactive to light.  Neck: Normal range of motion. Neck supple.  Cardiovascular: Normal rate, regular rhythm, normal heart sounds and intact distal pulses.  Exam reveals no gallop and no friction rub.   No murmur heard. Pulmonary/Chest: Effort normal and breath sounds normal. No respiratory distress. He has no wheezes.  Abdominal: Soft. Bowel sounds are normal. He exhibits no distension. There is no tenderness. There is no rebound and no guarding.  Musculoskeletal: Normal range of motion. He exhibits no edema or tenderness.  No focal tenderness of the feet bilaterally.  Normal capillary refill in the feet bilaterally.  2+ distal pulses in bilateral lower extremities.  Neurological: He is alert and oriented to person, place, and time.  alert, oriented x3 speech: some stuttering, no obvious slurring on my exam memory: intact grossly cranial nerves II-XII: intact motor strength: full proximally and distally no involuntary movements or tremors sensation: intact to light touch diffusely  cerebellar: finger-to-nose and heel-to-shin intact gait: Shuffling gait forwards and backwards with mild assistance. The patient feels imbalance when standing.  Skin: Skin is warm and dry.  Faint erythema noted to the distal aspect of the right foot.    Psychiatric: He has a normal mood and affect. His behavior is normal.   Nursing note and vitals reviewed.   ED Course  Procedures (including critical care time) Labs Review Labs Reviewed  COMPREHENSIVE METABOLIC PANEL - Abnormal; Notable for the following:    Glucose, Bld 102 (*)    BUN 30 (*)    Creatinine, Ser 1.50 (*)    GFR calc non Af Amer 41 (*)    GFR calc Af Amer 47 (*)    All other components within normal limits  PROTIME-INR - Abnormal; Notable for the following:    Prothrombin Time 18.0 (*)    All other components within normal limits  HEMOGLOBIN A1C - Abnormal; Notable for the following:    Hgb A1c MFr Bld 6.0 (*)    Mean Plasma Glucose 126 (*)    All other components within normal limits  HEPARIN LEVEL (UNFRACTIONATED) - Abnormal; Notable for the following:    Heparin Unfractionated 0.25 (*)    All other components within normal limits  HEMOGLOBIN A1C - Abnormal; Notable for the following:    Hgb A1c MFr Bld 6.0 (*)    Mean Plasma Glucose 126 (*)    All other components within normal limits  BASIC METABOLIC PANEL - Abnormal; Notable for the following:    Potassium 3.3 (*)    Glucose, Bld 112 (*)    Calcium 8.1 (*)    GFR calc non Af Amer 54 (*)    GFR calc Af Amer 63 (*)    All other components within normal limits  CBC - Abnormal; Notable for the following:    WBC 10.6 (*)    All other components within normal limits  PROTIME-INR - Abnormal; Notable for the following:    Prothrombin Time 18.4 (*)    INR 1.52 (*)    All other components within normal limits  URINE CULTURE  URINE RAPID DRUG SCREEN (HOSP PERFORMED)  CBC  APTT  URINALYSIS, ROUTINE W REFLEX MICROSCOPIC  TSH  LIPID PANEL  HEPARIN LEVEL (UNFRACTIONATED)  Randolm Idol, ED    Imaging Review Dg Chest 2 View  01/18/2014   CLINICAL DATA:  Hypertension, hypercholesterolemia.  Screening.  EXAM: CHEST  2 VIEW  COMPARISON:  02/03/2012  FINDINGS: The heart size and mediastinal contours are within normal limits. Both lungs are clear. The visualized skeletal  structures are unremarkable.  IMPRESSION: No active cardiopulmonary disease.   Electronically Signed   By: Rolm Baptise M.D.   On: 01/18/2014 15:43   Ct Head Wo Contrast  01/18/2014   CLINICAL DATA:  Increased slurred speech this morning.  EXAM: CT HEAD WITHOUT CONTRAST  TECHNIQUE: Contiguous axial images were obtained from the base of the skull through the vertex without intravenous contrast.  COMPARISON:  MRI 03/18/2012  FINDINGS: There is low-density noted in the left posterior frontal lobe and anterior parietal lobe compatible with acute to subacute infarct. No hemorrhage. No additional acute infarction. No hydrocephalus or mass lesion.  Mild mucosal thickening in the ethmoid air cells. No air-fluid levels. Mastoid air cells are clear.  IMPRESSION: Acute to subacute left posterior  frontal/anterior parietal infarct.   Electronically Signed   By: Rolm Baptise M.D.   On: 01/18/2014 09:52   Mr Brain Wo Contrast  01/18/2014   ADDENDUM REPORT: 01/18/2014 15:20  ADDENDUM: The report of a brain MRI 12/27/2013 in [place] does not document any infarcts in the cerebellum, suggesting these are new.   Electronically Signed   By: Lawrence Santiago M.D.   On: 01/18/2014 15:20   01/18/2014   CLINICAL DATA:  Dysarthria and ataxia.  EXAM: MRI HEAD WITHOUT CONTRAST  TECHNIQUE: Multiplanar, multiecho pulse sequences of the brain and surrounding structures were obtained without intravenous contrast.  COMPARISON:  CT head without contrast 01/18/2014.  FINDINGS: The diffusion-weighted images confirm an acute/subacute infarct within the posterior left frontal lobe. Cortical T1 shortening is compatible with cortical laminar necrosis. There is cortical susceptibility which may related to the cortical laminar necrosis or possibly venous congestion. There is no definite hemorrhage.  An area of acute infarction is also noted within the anteromedial left superior cerebellum. A subacute area of infarction is noted bilaterally  within the left cerebellar hemisphere. There is no hemorrhage associated with this lesion. T2 changes are noted at all three sites of acute and subacute infarction. Mild periventricular and subcortical white matter changes are noted as well. There is mild generalized atrophy, likely within normal limits for age. Central white matter changes are noted within the midbrain and pons. Focal white matter changes noted within the posterior limb of the left internal capsule or lateral thalamus.  Flow is present in the major intracranial arteries. The patient is status post bilateral lens replacements.  Mild mucosal thickening is scattered throughout the ethmoid air cells and in the inferior maxillary sinuses bilaterally. The remaining paranasal sinuses and the mastoid air cells are clear.  IMPRESSION: 1. Acute/subacute infarct in the posterior left frontal lobe is confirmed. There is evidence of cortical laminar necrosis without definite hemorrhage. 2. Acute/subacute infarct within the posterolateral left cerebellum. 3. More acute linear area of infarction within the anteromedial superior left cerebellum. 4. Other scattered white matter disease is present bilaterally and within the brainstem. 5. Mild sinus disease.  Electronically Signed: By: Lawrence Santiago M.D. On: 01/18/2014 14:47   Mr Outside Films Head/face  01/18/2014   This examination belongs to an outside facility and is stored  here for comparison purposes only.  Contact the originating outside  institution for any associated report or interpretation.  Mr Outside Films Head/face  01/18/2014   This examination belongs to an outside facility and is stored  here for comparison purposes only.  Contact the originating outside  institution for any associated report or interpretation.    EKG Interpretation   Date/Time:  Tuesday January 18 2014 09:05:35 EST Ventricular Rate:  82 PR Interval:  225 QRS Duration: 102 QT Interval:  386 QTC Calculation:  451 R Axis:   -4 Text Interpretation:  Sinus rhythm Ventricular trigeminy Prolonged PR  interval Low voltage, precordial leads Borderline T abnormalities,  inferior leads Confirmed by Anahita Cua  MD, Ameka Krigbaum ([id number]) on 01/18/2014  9:15:10 AM      MDM   Final diagnoses:  Imbalance  Slurring of speech  Cerebral infarction due to thrombosis of precerebral artery    9:39 AM 80 y.o. male w hx of HTN, HLP, Parkinsons and recent stroke who presents with imbalance and slightly worsening speech. The son notes that the patient ambulates with a walker some but is mostly able to ambulate on his own. This morning when he got up he was unable to walk because he felt some imbalanced. The son also felt like he might of had slightly slurred speech although the patient does have some stuttering and speech deficits at baseline. His vital signs are unremarkable here and he has a NIHSS of 1 if you count the speech issue. He is able to stand and take a few steps forward and backwards with my assistance.  Neuro recommending MRI and admission. Will admit to hospitalist.   Pamella Pert, MD 01/19/14 2141

## 2014-01-18 NOTE — ED Notes (Signed)
Patient transported to MRI 

## 2014-01-18 NOTE — H&P (Addendum)
Patient Demographics  Kyle Cameron, is a 80 y.o. male  MRN: [id number]   DOB - 08-Aug-1926  Admit Date - 01/18/2014  Outpatient Primary MD for the patient is Kandice Hams, MD  with history of  -   Past Medical History  Diagnosis Date  . Hypertension   . High cholesterol   . Prostate atrophy   . GERD (gastroesophageal reflux disease)   . History of diverticulitis of colon   . Benign enlargement of prostate   . Hypothyroidism   . History of renal calculi   . Parkinson's disease   . Renal calculi   . CVA (cerebral infarction)       Past Surgical History  Procedure Laterality Date  . Cholecystectomy  02/04/2012    Procedure: LAPAROSCOPIC CHOLECYSTECTOMY WITH INTRAOPERATIVE CHOLANGIOGRAM;  Surgeon: Merrie Roof, MD;  Location: [facility];  Service: General;  Laterality: N/A;  . Lithotripsy      renal calculi  . Tonsillectomy    . Cataract extraction, bilateral      in for   Chief Complaint  Patient presents with  . Cerebrovascular Accident     HPI  Kyle Cameron  is a 80 y.o. male, with recent history of stroke which happened in [place] 2 weeks ago, apparently he was found to have intracardiac thrombus and was placed on Lovenox and Coumadin overlap, he then came to [place] where he lives with his son, he was doing fairly well but this morning he started experiencing some generalized weakness and had difficulty getting up, his gait was unsteady, he was then brought to the ER where his workup was not suggestive of a new stroke, he was seen by an neurologist and hospitalist team was requested to admit the patient for generalized weakness possible TIA versus small stroke.   Patient currently denies any headache fever chills, no chest abdominal pain, no diarrhea or dysuria, no focal weakness. He does  feel that his legs are weak, he slightly anxious. Otherwise unremarkable review of systems.    Review of Systems    In addition to the HPI above,   No Fever-chills, No Headache, No changes with Vision or hearing, No problems swallowing food or Liquids, No Chest pain, Cough or Shortness of Breath, No Abdominal pain, No Nausea or Vommitting, Bowel movements are regular, No Blood in stool or Urine, No dysuria, No new skin rashes or bruises, No new joints pains-aches,  No new weakness, tingling, numbness in any extremity, No recent weight gain or loss, No polyuria, polydypsia or polyphagia, No significant Mental Stressors.  A full 10 point Review of Systems was done, except as stated above, all other Review of Systems were negative.   Social History History  Substance Use Topics  . Smoking status: Former Research scientist (life sciences)  . Smokeless tobacco: Never Used  . Alcohol Use: Yes     Comment: Consumes 2 glasses of wine daily      Family History Family  History  Problem Relation Age of Onset  . Parkinsonism Mother   . Heart disease Father   . Esophageal cancer Sister       Prior to Admission medications   Medication Sig Start Date End Date Taking? Authorizing Provider  acetaminophen (TYLENOL) 500 MG tablet Take 500 mg by mouth every 8 (eight) hours as needed for mild pain or moderate pain.   Yes Historical Provider, MD  atorvastatin (LIPITOR) 20 MG tablet Take 20 mg by mouth daily.   Yes Historical Provider, MD  carbidopa-levodopa (SINEMET IR) 25-100 MG per tablet TAKE HALF TABLET BY MOUTH THREE TIMES DAILY  10/04/13  Yes Kathrynn Ducking, MD  cholecalciferol (VITAMIN D) 1000 UNITS tablet Take 1,000 Units by mouth daily.   Yes Historical Provider, MD  dutasteride (AVODART) 0.5 MG capsule Take 0.5 mg by mouth every other day.   Yes Historical Provider, MD  enoxaparin (LOVENOX) 80 MG/0.8ML injection Inject 70 mg into the skin every 12 (twelve) hours.   Yes Historical Provider, MD  ezetimibe  (ZETIA) 10 MG tablet Take 10 mg by mouth daily.   Yes Historical Provider, MD  levothyroxine (SYNTHROID, LEVOTHROID) 50 MCG tablet Take 50 mcg by mouth daily before breakfast.   Yes Historical Provider, MD  losartan-hydrochlorothiazide (HYZAAR) 100-25 MG per tablet Take 1 tablet by mouth daily.   Yes Historical Provider, MD  Multiple Vitamin (MULTIVITAMIN WITH MINERALS) TABS Take 1 tablet by mouth daily. Centrum 50 plus   Yes Historical Provider, MD  selegiline (ELDEPRYL) 5 MG tablet TAKE ONE TABLET TWICE DAILY WITH MEALS. TAKE WITH BREAKFAST AND LUNCH. 08/25/13  Yes Kathrynn Ducking, MD  warfarin (COUMADIN) 5 MG tablet Take 5 mg by mouth daily.   Yes Historical Provider, MD    No Known Allergies  Physical Exam  Vitals  Blood pressure 153/77, pulse 85, temperature 98 F (36.7 C), temperature source Oral, resp. rate 16, height 5\' 6"  (1.676 m), weight 71.215 kg (157 lb), SpO2 99 %.   1. General frail elderly white male lying in bed in NAD,     2. Normal affect and insight, Not Suicidal or Homicidal, Awake Alert, Oriented X 3.  3. No F.N deficits, ALL C.Nerves Intact, Strength 5/5 all 4 extremities, Sensation intact all 4 extremities, Plantars down going.  4. Ears and Eyes appear Normal, Conjunctivae clear, PERRLA. Moist Oral Mucosa.  5. Supple Neck, No JVD, No cervical lymphadenopathy appriciated, No Carotid Bruits.  6. Symmetrical Chest wall movement, Good air movement bilaterally, CTAB.  7. RRR, No Gallops, Rubs or Murmurs, No Parasternal Heave.  8. Positive Bowel Sounds, Abdomen Soft, No tenderness, No organomegaly appriciated,No rebound -guarding or rigidity.  9.  No Cyanosis, Normal Skin Turgor, No Skin Rash or Bruise.  10. Good muscle tone,  joints appear normal , no effusions, Normal ROM.  11. No Palpable Lymph Nodes in Neck or Axillae     Data Review  CBC  Recent Labs Lab 01/18/14 0925  WBC 7.9  HGB 14.8  HCT 45.8  PLT 399  MCV 88.9  MCH 28.7  MCHC 32.3    RDW 13.2   ------------------------------------------------------------------------------------------------------------------  Chemistries   Recent Labs Lab 01/18/14 0925  NA 139  K 3.5  CL 101  CO2 29  GLUCOSE 102*  BUN 30*  CREATININE 1.50*  CALCIUM 9.1  AST 25  ALT 33  ALKPHOS 43  BILITOT 0.7   ------------------------------------------------------------------------------------------------------------------ estimated creatinine clearance is 32.5 mL/min (by C-G formula based on Cr of 1.5). ------------------------------------------------------------------------------------------------------------------  No results for input(s): TSH, T4TOTAL, T3FREE, THYROIDAB in the last 72 hours.  Invalid input(s): FREET3   Coagulation profile  Recent Labs Lab 01/18/14 0925  INR 1.47   ------------------------------------------------------------------------------------------------------------------- No results for input(s): DDIMER in the last 72 hours. -------------------------------------------------------------------------------------------------------------------  Cardiac Enzymes No results for input(s): CKMB, TROPONINI, MYOGLOBIN in the last 168 hours.  Invalid input(s): CK ------------------------------------------------------------------------------------------------------------------ Invalid input(s): POCBNP   ---------------------------------------------------------------------------------------------------------------  Urinalysis    Component Value Date/Time   COLORURINE YELLOW 01/18/2014 Protivin 01/18/2014 0938   LABSPEC 1.020 01/18/2014 0938   PHURINE 5.5 01/18/2014 0938   GLUCOSEU NEGATIVE 01/18/2014 0938   HGBUR NEGATIVE 01/18/2014 0938   BILIRUBINUR NEGATIVE 01/18/2014 0938   KETONESUR NEGATIVE 01/18/2014 0938   PROTEINUR NEGATIVE 01/18/2014 0938   UROBILINOGEN 0.2 01/18/2014 0938   NITRITE NEGATIVE 01/18/2014 0938   LEUKOCYTESUR  NEGATIVE 01/18/2014 0938    ----------------------------------------------------------------------------------------------------------------  Imaging results:   Ct Head Wo Contrast  01/18/2014   CLINICAL DATA:  Increased slurred speech this morning.  EXAM: CT HEAD WITHOUT CONTRAST  TECHNIQUE: Contiguous axial images were obtained from the base of the skull through the vertex without intravenous contrast.  COMPARISON:  MRI 03/18/2012  FINDINGS: There is low-density noted in the left posterior frontal lobe and anterior parietal lobe compatible with acute to subacute infarct. No hemorrhage. No additional acute infarction. No hydrocephalus or mass lesion.  Mild mucosal thickening in the ethmoid air cells. No air-fluid levels. Mastoid air cells are clear.  IMPRESSION: Acute to subacute left posterior  frontal/anterior parietal infarct.   Electronically Signed   By: Rolm Baptise M.D.   On: 01/18/2014 09:52    My personal review of EKG: Rhythm NSR, few PVCs   Assessment & Plan  1. Generalized weakness in a patient with recent CVA. Exam is nonfocal, she does appear slightly dehydrated and this could be the reason for his weakness, he has been seen by neurology, he is currently on Coumadin with a subtherapeutic INR, this will be continued with pharmacy monitoring levels, Lovenox if Neuro wants, will confirm, continue statin, repeat MRI to rule out a new stroke. PT and OT and speech eval. Neurology following.    2. Recent intracardiac thrombus. Continue Coumadin. Pharmacy to monitor. Repeat TTE.   3. History of Parkinson's disease. Continue home medications unchanged.   4. Dyslipidemia. On statin continue. Check lipid panel.   5. BPH. Continue Avodart.   6. Acute renal failure secondary to dehydration. Hydrate gently repeat BMP in the morning.     DVT Prophylaxis Lovenox - Coumadin  AM Labs Ordered, also please review Full Orders  Family Communication: Admission, patients condition  and plan of care including tests being ordered have been discussed with the patient and SON who indicate understanding and agree with the plan and Code Status.  Code Status DNR  Likely DC to  Home  Condition GUARDED    Time spent in minutes : 35    Rey Dansby K M.D on 01/18/2014 at 1:48 PM  Between 7am to 7pm - Pager - [phone]  After 7pm go to [email or web address] - [facility] Office  [phone]

## 2014-01-18 NOTE — Progress Notes (Addendum)
ANTICOAGULATION CONSULT NOTE - Initial Consult  Pharmacy Consult for lovenox and coumadin Indication: stroke, cardiac thrombus  No Known Allergies  Patient Measurements: Height: 5\' 6"  (167.6 cm) Weight: 157 lb (71.215 kg) IBW/kg (Calculated) : 63.8  Vital Signs: Temp: 98 F (36.7 C) (12/29 1054) Temp Source: Oral (12/29 0852) BP: 153/77 mmHg (12/29 1245) Pulse Rate: 85 (12/29 1245)  Labs:  Recent Labs  01/18/14 0925  HGB 14.8  HCT 45.8  PLT 399  APTT 33  LABPROT 18.0*  INR 1.47  CREATININE 1.50*    Estimated Creatinine Clearance: 32.5 mL/min (by C-G formula based on Cr of 1.5).   Medical History: Past Medical History  Diagnosis Date  . Hypertension   . High cholesterol   . Prostate atrophy   . GERD (gastroesophageal reflux disease)   . History of diverticulitis of colon   . Benign enlargement of prostate   . Hypothyroidism   . History of renal calculi   . Parkinson's disease   . Renal calculi   . CVA (cerebral infarction)     Medications:  See medication history Assessment: 80 yo man on coumadin PTA with subtherapeutic INR to continue coumadin and start lovenox until INR >2.0.   Goal of Therapy:  INR 2-3 Monitor platelets by anticoagulation protocol: Yes   Plan:  Coumadin 6 mg po today Daily PT/INR Lovenox 70 mg sq q12 hours Monitor for bleeding complications.  Excell Seltzer Poteet 01/18/2014,2:09 PM  Addum:  Sinemet and eldepryl verifed with patient's pharmacy

## 2014-01-18 NOTE — Progress Notes (Signed)
ANTICOAGULATION CONSULT NOTE - Initial Consult  Pharmacy Consult for Heparin Indication: left ventricle apical thrombi  No Known Allergies  Patient Measurements: Height: 5\' 6"  (167.6 cm) Weight: 157 lb (71.215 kg) IBW/kg (Calculated) : 63.8 Heparin Dosing Weight: 71 kg  Vital Signs: Temp: 98.7 F (37.1 C) (12/29 1606) Temp Source: Oral (12/29 1606) BP: 155/76 mmHg (12/29 1606) Pulse Rate: 76 (12/29 1606)  Labs:  Recent Labs  01/18/14 0925  HGB 14.8  HCT 45.8  PLT 399  APTT 33  LABPROT 18.0*  INR 1.47  CREATININE 1.50*    Estimated Creatinine Clearance: 32.5 mL/min (by C-G formula based on Cr of 1.5).   Medical History: Past Medical History  Diagnosis Date  . Hypertension   . High cholesterol   . Prostate atrophy   . GERD (gastroesophageal reflux disease)   . History of diverticulitis of colon   . Benign enlargement of prostate   . Hypothyroidism   . History of renal calculi   . Parkinson's disease   . Renal calculi   . CVA (cerebral infarction)     Assessment: 30 YOM with recent cardiogenic stroke on coumadin PTA, admitted for recurrent stroke with subtherapeutic INR 1.43, MRI suggesting a small new stroke, discussed with Dr. Armida Sans, will bridge with IV heparin with no bolus. Coumadin is already ordered.  Goal of Therapy:  Heparin level 0.3-0.5 units/ml Monitor platelets by anticoagulation protocol: Yes   Plan:  - Heparin 900 units/hr with no bolus - 8 hr heparin level at 0100 - daily heparin level and CBC  Maryanna Shape, PharmD, BCPS  Clinical Pharmacist  Pager: [phone]   01/18/2014,4:29 PM

## 2014-01-18 NOTE — Progress Notes (Signed)
Patient arrived at 4N18 via stretcher. Assessments performed as charted. VSS. Patient denies any pain. Will continue to monitor.

## 2014-01-18 NOTE — ED Notes (Signed)
Pt here for increased slurred speech this am, lsn 10 pm, pt discharged with stroke 3 weeks ago with speech deficits and now deficits worse. Pt denies pain.

## 2014-01-18 NOTE — ED Notes (Signed)
Kyle Cameron - [place] - [phone]

## 2014-01-18 NOTE — Consult Note (Signed)
Referring Physician: Dr. Aline Brochure    Chief Complaint: dysarthria, ataxia  HPI:                                                                                                                                         Kyle Cameron is an 80 y.o. male with a past medical history significant for HTN, hypercholesterolemia, left MCA branch distribution ischemic stroke 12/27/13 with residual mild dysarthria, PD, GERD, brought in by family member for further evaluation of the above stated symptoms. Kyle Cameron indicated that he has been doing well since his recent stroke (as per patient's outside records initial symptoms at that time consisted of right hemiparesis, right face weakness, and expressive aphasia.), able to ambulate with a walker. He said that he went to bed last night feeling well, woke up to go the bathroom around 7 am and was very off balance, something that did not happen before. Then, his son noticed worsening slurred speech that patient said is now back to his baseline. Denies associated HA, vertigo, double vision, focal weakness-numbness, confusion, or visual impairment. He is on coumadin reportedly due to a cardiac thrombus. INR 1.47 today. Serologies and UA are otherwise unremarkable. CT head: acute/subacute left posterior frontal-anterior parietal infarct. Recent stroke work up significant for LV apical thrombus by TTE. Date last known well: 01/18/14 Time last known well: 7 am tPA Given: no, minimal deficits, recent stroke.   Past Medical History  Diagnosis Date  . Hypertension   . High cholesterol   . Prostate atrophy   . GERD (gastroesophageal reflux disease)   . History of diverticulitis of colon   . Benign enlargement of prostate   . Hypothyroidism   . History of renal calculi   . Parkinson's disease   . Renal calculi     Past Surgical History  Procedure Laterality Date  . Cholecystectomy  02/04/2012    Procedure: LAPAROSCOPIC CHOLECYSTECTOMY WITH INTRAOPERATIVE  CHOLANGIOGRAM;  Surgeon: Merrie Roof, MD;  Location: [facility];  Service: General;  Laterality: N/A;  . Lithotripsy      renal calculi  . Tonsillectomy    . Cataract extraction, bilateral      Family History  Problem Relation Age of Onset  . Parkinsonism Mother   . Heart disease Father   . Esophageal cancer Sister    Social History:  reports that he has quit smoking. He has never used smokeless tobacco. He reports that he drinks alcohol. He reports that he does not use illicit drugs.  Allergies: No Known Allergies  Medications:  I have reviewed the patient's current medications.  ROS:                                                                                                                                       History obtained from the patient and chart review  General ROS: negative for - chills, fatigue, fever, night sweats, weight gain or weight loss Psychological ROS: negative for - behavioral disorder, hallucinations, memory difficulties, mood swings or suicidal ideation Ophthalmic ROS: negative for - blurry vision, double vision, eye pain or loss of vision ENT ROS: negative for - epistaxis, nasal discharge, oral lesions, sore throat, tinnitus or vertigo Allergy and Immunology ROS: negative for - hives or itchy/watery eyes Hematological and Lymphatic ROS: negative for - bleeding problems, bruising or swollen lymph nodes Endocrine ROS: negative for - galactorrhea, hair pattern changes, polydipsia/polyuria or temperature intolerance Respiratory ROS: negative for - cough, hemoptysis, shortness of breath or wheezing Cardiovascular ROS: negative for - chest pain, dyspnea on exertion, edema or irregular heartbeat Gastrointestinal ROS: negative for - abdominal pain, diarrhea, hematemesis, nausea/vomiting or stool incontinence Genito-Urinary ROS: negative for -  dysuria, hematuria, incontinence or urinary frequency/urgency Musculoskeletal ROS: negative for - joint swelling or muscular weakness Neurological ROS: as noted in HPI Dermatological ROS: negative for rash and skin lesion changes  Physical exam: pleasant male in no apparent distress.Blood pressure 169/80, pulse 92, temperature 98 F (36.7 C), temperature source Oral, resp. rate 17, height $RemoveBe'5\' 6"'IbarqIoaX$  (1.676 m), weight 71.215 kg (157 lb), SpO2 97 %. Head: normocephalic. Neck: supple, no bruits, no JVD. Cardiac: no murmurs. Lungs: clear. Abdomen: soft, no tender, no mass. Extremities: no edema. Neurologic Examination:                                                                                                      General: Mental Status: Alert, oriented, thought content appropriate.  Speech fluent without evidence of aphasia.  Able to follow 3 step commands without difficulty. Cranial Nerves: II: Discs flat bilaterally; Visual fields grossly normal, pupils equal, round, reactive to light and accommodation III,IV, VI: ptosis not present, extra-ocular motions intact bilaterally V,VII: smile symmetric, facial light touch sensation normal bilaterally VIII: hearing normal bilaterally IX,X: gag reflex present XI: bilateral shoulder shrug XII: midline tongue extension without atrophy or fasciculations  Motor: Right : Upper extremity   5/5    Left:     Upper extremity   5/5  Lower extremity   5/5  Lower extremity   5/5 Tone and bulk:normal tone throughout; no atrophy noted Sensory: Pinprick and light touch intact throughout, bilaterally Deep Tendon Reflexes:  Right: Upper Extremity   Left: Upper extremity   biceps (C-5 to C-6) 2/4   biceps (C-5 to C-6) 2/4 tricep (C7) 2/4    triceps (C7) 2/4 Brachioradialis (C6) 2/4  Brachioradialis (C6) 2/4  Lower Extremity Lower Extremity  quadriceps (L-2 to L-4) 2/4   quadriceps (L-2 to L-4) 2/4 Achilles (S1) 2/4   Achilles (S1)  2/4  Plantars: Right: downgoing   Left: downgoing Cerebellar: normal finger-to-nose,  normal heel-to-shin test Gait: Unsteady but not frank ataxia, mild bradykinesia    Results for orders placed or performed during the hospital encounter of 01/18/14 (from the past 48 hour(s))  CBC     Status: None   Collection Time: 01/18/14  9:25 AM  Result Value Ref Range   WBC 7.9 4.0 - 10.5 K/uL   RBC 5.15 4.22 - 5.81 MIL/uL   Hemoglobin 14.8 13.0 - 17.0 g/dL   HCT 45.8 39.0 - 52.0 %   MCV 88.9 78.0 - 100.0 fL   MCH 28.7 26.0 - 34.0 pg   MCHC 32.3 30.0 - 36.0 g/dL   RDW 13.2 11.5 - 15.5 %   Platelets 399 150 - 400 K/uL  Comprehensive metabolic panel     Status: Abnormal   Collection Time: 01/18/14  9:25 AM  Result Value Ref Range   Sodium 139 135 - 145 mmol/L    Comment: Please note change in reference range.   Potassium 3.5 3.5 - 5.1 mmol/L    Comment: Please note change in reference range.   Chloride 101 96 - 112 mEq/L   CO2 29 19 - 32 mmol/L   Glucose, Bld 102 (H) 70 - 99 mg/dL   BUN 30 (H) 6 - 23 mg/dL   Creatinine, Ser 1.50 (H) 0.50 - 1.35 mg/dL   Calcium 9.1 8.4 - 10.5 mg/dL   Total Protein 6.3 6.0 - 8.3 g/dL   Albumin 3.6 3.5 - 5.2 g/dL   AST 25 0 - 37 U/L   ALT 33 0 - 53 U/L   Alkaline Phosphatase 43 39 - 117 U/L   Total Bilirubin 0.7 0.3 - 1.2 mg/dL   GFR calc non Af Amer 41 (L) >90 mL/min   GFR calc Af Amer 47 (L) >90 mL/min    Comment: (NOTE) The eGFR has been calculated using the CKD EPI equation. This calculation has not been validated in all clinical situations. eGFR's persistently <90 mL/min signify possible Chronic Kidney Disease.    Anion gap 9 5 - 15  Protime-INR     Status: Abnormal   Collection Time: 01/18/14  9:25 AM  Result Value Ref Range   Prothrombin Time 18.0 (H) 11.6 - 15.2 seconds   INR 1.47 0.00 - 1.49  APTT     Status: None   Collection Time: 01/18/14  9:25 AM  Result Value Ref Range   aPTT 33 24 - 37 seconds  I-stat troponin, ED (not at  [facility])     Status: None   Collection Time: 01/18/14  9:30 AM  Result Value Ref Range   Troponin i, poc 0.03 0.00 - 0.08 ng/mL   Comment 3            Comment: Due to the release kinetics of cTnI, a negative result within the first hours of the onset of symptoms does not rule out myocardial infarction with certainty.  If myocardial infarction is still suspected, repeat the test at appropriate intervals.   Urine Drug Screen     Status: None   Collection Time: 01/18/14  9:38 AM  Result Value Ref Range   Opiates NONE DETECTED NONE DETECTED   Cocaine NONE DETECTED NONE DETECTED   Benzodiazepines NONE DETECTED NONE DETECTED   Amphetamines NONE DETECTED NONE DETECTED   Tetrahydrocannabinol NONE DETECTED NONE DETECTED   Barbiturates NONE DETECTED NONE DETECTED    Comment:        DRUG SCREEN FOR MEDICAL PURPOSES ONLY.  IF CONFIRMATION IS NEEDED FOR ANY PURPOSE, NOTIFY LAB WITHIN 5 DAYS.        LOWEST DETECTABLE LIMITS FOR URINE DRUG SCREEN Drug Class       Cutoff (ng/mL) Amphetamine      1000 Barbiturate      200 Benzodiazepine   182 Tricyclics       993 Opiates          300 Cocaine          300 THC              50   Urinalysis, Routine w reflex microscopic     Status: None   Collection Time: 01/18/14  9:38 AM  Result Value Ref Range   Color, Urine YELLOW YELLOW   APPearance CLEAR CLEAR   Specific Gravity, Urine 1.020 1.005 - 1.030   pH 5.5 5.0 - 8.0   Glucose, UA NEGATIVE NEGATIVE mg/dL   Hgb urine dipstick NEGATIVE NEGATIVE   Bilirubin Urine NEGATIVE NEGATIVE   Ketones, ur NEGATIVE NEGATIVE mg/dL   Protein, ur NEGATIVE NEGATIVE mg/dL   Urobilinogen, UA 0.2 0.0 - 1.0 mg/dL   Nitrite NEGATIVE NEGATIVE   Leukocytes, UA NEGATIVE NEGATIVE    Comment: MICROSCOPIC NOT DONE ON URINES WITH NEGATIVE PROTEIN, BLOOD, LEUKOCYTES, NITRITE, OR GLUCOSE <1000 mg/dL.   Ct Head Wo Contrast  01/18/2014   CLINICAL DATA:  Increased slurred speech this morning.  EXAM: CT HEAD WITHOUT CONTRAST   TECHNIQUE: Contiguous axial images were obtained from the base of the skull through the vertex without intravenous contrast.  COMPARISON:  MRI 03/18/2012  FINDINGS: There is low-density noted in the left posterior frontal lobe and anterior parietal lobe compatible with acute to subacute infarct. No hemorrhage. No additional acute infarction. No hydrocephalus or mass lesion.  Mild mucosal thickening in the ethmoid air cells. No air-fluid levels. Mastoid air cells are clear.  IMPRESSION: Acute to subacute left posterior  frontal/anterior parietal infarct.   Electronically Signed   By: Rolm Baptise M.D.   On: 01/18/2014 09:52    Assessment: 80 y.o. male with recent left brain infarct, comes in with complains of unsteadiness and slurred speech (speech now back to baseline). CT brain showed acute/subacute infarct left posterior frontal-anterior parietal region. Sub-therapeutic INR 1.43 He said that he doing much better now. Unclear at this time if today findings on CT brain are from recent stroke, but most likely a subacute. Will suggest DWI-MRI to try to determine if patient's symptoms today correspond to a new infarct. If a new stroke is evident on MRI, no need to repeat stroke work up as this was completed recently. IRN is sub-therapeutic and with this new clinical event will suggest admission to the hospital until he is therapeutic on coumadin.  Stroke Risk Factors - age, HTN, hyperlipidemia, cardiac thrombus.   Dorian Pod, MD [facility] [phone]  01/18/2014, 12:02 PM

## 2014-01-19 DIAGNOSIS — E785 Hyperlipidemia, unspecified: Secondary | ICD-10-CM

## 2014-01-19 DIAGNOSIS — I6330 Cerebral infarction due to thrombosis of unspecified cerebral artery: Secondary | ICD-10-CM

## 2014-01-19 DIAGNOSIS — I213 ST elevation (STEMI) myocardial infarction of unspecified site: Secondary | ICD-10-CM

## 2014-01-19 DIAGNOSIS — R059 Cough, unspecified: Secondary | ICD-10-CM | POA: Insufficient documentation

## 2014-01-19 DIAGNOSIS — I513 Intracardiac thrombosis, not elsewhere classified: Secondary | ICD-10-CM | POA: Insufficient documentation

## 2014-01-19 DIAGNOSIS — I10 Essential (primary) hypertension: Secondary | ICD-10-CM

## 2014-01-19 DIAGNOSIS — R05 Cough: Secondary | ICD-10-CM | POA: Insufficient documentation

## 2014-01-19 DIAGNOSIS — R2689 Other abnormalities of gait and mobility: Secondary | ICD-10-CM

## 2014-01-19 LAB — CBC
HEMATOCRIT: 41.8 % (ref 39.0–52.0)
Hemoglobin: 13.6 g/dL (ref 13.0–17.0)
MCH: 28 pg (ref 26.0–34.0)
MCHC: 32.5 g/dL (ref 30.0–36.0)
MCV: 86 fL (ref 78.0–100.0)
Platelets: 392 10*3/uL (ref 150–400)
RBC: 4.86 MIL/uL (ref 4.22–5.81)
RDW: 13.2 % (ref 11.5–15.5)
WBC: 10.6 10*3/uL — ABNORMAL HIGH (ref 4.0–10.5)

## 2014-01-19 LAB — BASIC METABOLIC PANEL
Anion gap: 8 (ref 5–15)
BUN: 23 mg/dL (ref 6–23)
CALCIUM: 8.1 mg/dL — AB (ref 8.4–10.5)
CO2: 26 mmol/L (ref 19–32)
Chloride: 105 mEq/L (ref 96–112)
Creatinine, Ser: 1.18 mg/dL (ref 0.50–1.35)
GFR calc Af Amer: 63 mL/min — ABNORMAL LOW (ref >90)
GFR, EST NON AFRICAN AMERICAN: 54 mL/min — AB (ref >90)
GLUCOSE: 112 mg/dL — AB (ref 70–99)
Potassium: 3.3 mmol/L — ABNORMAL LOW (ref 3.5–5.1)
Sodium: 139 mmol/L (ref 135–145)

## 2014-01-19 LAB — LIPID PANEL
CHOL/HDL RATIO: 2.8 ratio
Cholesterol: 157 mg/dL (ref 0–200)
HDL: 57 mg/dL (ref >39)
LDL CALC: 92 mg/dL (ref 0–99)
Triglycerides: 41 mg/dL (ref <150)
VLDL: 8 mg/dL (ref 0–40)

## 2014-01-19 LAB — URINE CULTURE
CULTURE: NO GROWTH
Colony Count: NO GROWTH

## 2014-01-19 LAB — PROTIME-INR
INR: 1.52 — ABNORMAL HIGH (ref 0.00–1.49)
PROTHROMBIN TIME: 18.4 s — AB (ref 11.6–15.2)

## 2014-01-19 LAB — HEPARIN LEVEL (UNFRACTIONATED)
HEPARIN UNFRACTIONATED: 0.25 [IU]/mL — AB (ref 0.30–0.70)
HEPARIN UNFRACTIONATED: 0.5 [IU]/mL (ref 0.30–0.70)

## 2014-01-19 LAB — HEMOGLOBIN A1C
HEMOGLOBIN A1C: 6 % — AB (ref <5.7)
Hgb A1c MFr Bld: 6 % — ABNORMAL HIGH (ref <5.7)
MEAN PLASMA GLUCOSE: 126 mg/dL — AB (ref <117)
Mean Plasma Glucose: 126 mg/dL — ABNORMAL HIGH (ref <117)

## 2014-01-19 MED ORDER — WARFARIN SODIUM 7.5 MG PO TABS: 7.5000 mg | ORAL_TABLET | Freq: Once | ORAL | Status: DC

## 2014-01-19 MED ORDER — ENOXAPARIN SODIUM 80 MG/0.8ML ~~LOC~~ SOLN: 70.0000 mg | Freq: Two times a day (BID) | SUBCUTANEOUS | Status: DC

## 2014-01-19 MED ORDER — POTASSIUM CHLORIDE CRYS ER 20 MEQ PO TBCR
40.0000 meq | EXTENDED_RELEASE_TABLET | Freq: Once | ORAL | Status: AC
  Administered 2014-01-19: 40 meq via ORAL
  Filled 2014-01-19: qty 2

## 2014-01-19 MED ORDER — GUAIFENESIN-DM 100-10 MG/5ML PO SYRP: 5.0000 mL | ORAL_SOLUTION | ORAL | Status: DC | PRN

## 2014-01-19 NOTE — Progress Notes (Signed)
Talked to patient with son present about DCP; patient is active with [place] for [facility] services - HHPT/ OT/ ST; Tim with [facility] call to resume services at discharge; Attending MD at discharge please order HHPT/ OT/ ST; Aneta Mins [phone]

## 2014-01-19 NOTE — Progress Notes (Signed)
STROKE TEAM PROGRESS NOTE   HISTORY Rocky Gladden is an 80 y.o. male with a past medical history significant for HTN, hypercholesterolemia, left MCA branch distribution ischemic stroke 12/27/13 with residual mild dysarthria, PD, GERD, brought in by family member for further evaluation of dysarthria and ataxia. Mr. Fults indicated that he has been doing well since his recent stroke (as per patient's outside records initial symptoms at that time consisted of right hemiparesis, right face weakness, and expressive aphasia.), able to ambulate with a walker. He said that he went to bed last night feeling well 01/17/2014, woke up to go the bathroom around 7 am and was very off balance, something that did not happen before. Then, his son noticed worsening slurred speech that patient said is now back to his baseline (LKW 01/18/2014 at 0700). Denies associated HA, vertigo, double vision, focal weakness-numbness, confusion, or visual impairment. He is on coumadin reportedly due to a cardiac thrombus. INR 1.47. Serologies and UA are otherwise unremarkable. CT head: acute/subacute left posterior frontal-anterior parietal infarct. Recent stroke work up significant for LV apical thrombus by TTE. Patient was not administered TPA secondary to minimal deficits, recent stroke. He was admitted for further evaluation and treatment.   SUBJECTIVE (INTERVAL HISTORY) His son is at the bedside.  Overall he feels his condition is stable. He would like to go home. [facility] is checking INR at home. Dr. Delfina Redwood is managing him as an OP. He stopped lovenox when INR at 2.0. However, on admission INR 1.47.   OBJECTIVE Temp:  [96.8 F (36 C)-98.8 F (37.1 C)] 97.9 F (36.6 C) (12/30 0923) Pulse Rate:  [72-98] 84 (12/30 0923) Cardiac Rhythm:  [-] Heart block (12/30 0900) Resp:  [13-26] 16 (12/30 0923) BP: (121-171)/(62-97) 145/72 mmHg (12/30 0923) SpO2:  [96 %-100 %] 98 % (12/30 0923) Weight:  [70.1 kg (154 lb 8.7 oz)] 70.1 kg  (154 lb 8.7 oz) (12/30 0522)  No results for input(s): GLUCAP in the last 168 hours.  Recent Labs Lab 01/18/14 0925 01/19/14 0100  NA 139 139  K 3.5 3.3*  CL 101 105  CO2 29 26  GLUCOSE 102* 112*  BUN 30* 23  CREATININE 1.50* 1.18  CALCIUM 9.1 8.1*    Recent Labs Lab 01/18/14 0925  AST 25  ALT 33  ALKPHOS 43  BILITOT 0.7  PROT 6.3  ALBUMIN 3.6    Recent Labs Lab 01/18/14 0925 01/19/14 0100  WBC 7.9 10.6*  HGB 14.8 13.6  HCT 45.8 41.8  MCV 88.9 86.0  PLT 399 392   No results for input(s): CKTOTAL, CKMB, CKMBINDEX, TROPONINI in the last 168 hours.  Recent Labs  01/18/14 0925  LABPROT 18.0*  INR 1.47    Recent Labs  01/18/14 0938  COLORURINE YELLOW  LABSPEC 1.020  PHURINE 5.5  GLUCOSEU NEGATIVE  HGBUR NEGATIVE  BILIRUBINUR NEGATIVE  KETONESUR NEGATIVE  PROTEINUR NEGATIVE  UROBILINOGEN 0.2  NITRITE NEGATIVE  LEUKOCYTESUR NEGATIVE       Component Value Date/Time   CHOL 157 01/19/2014 0100   TRIG 41 01/19/2014 0100   HDL 57 01/19/2014 0100   CHOLHDL 2.8 01/19/2014 0100   VLDL 8 01/19/2014 0100   LDLCALC 92 01/19/2014 0100   Lab Results  Component Value Date   HGBA1C 6.0* 01/18/2014      Component Value Date/Time   LABOPIA NONE DETECTED 01/18/2014 0938   COCAINSCRNUR NONE DETECTED 01/18/2014 0938   LABBENZ NONE DETECTED 01/18/2014 [place] DETECTED 01/18/2014 3335  THCU NONE DETECTED 01/18/2014 0938   LABBARB NONE DETECTED 01/18/2014 0938    No results for input(s): ETH in the last 168 hours.   Dg Chest 2 View 01/18/2014    No active cardiopulmonary disease.     Ct Head Wo Contrast 01/18/2014     Acute to subacute left posterior  frontal/anterior parietal infarct.     Mr Brain Wo Contrast 01/18/2014    1. Acute/subacute infarct in the posterior left frontal lobe is confirmed. There is evidence of cortical laminar necrosis without definite hemorrhage. 2. Acute/subacute infarct within the posterolateral left  cerebellum. 3. More acute linear area of infarction within the anteromedial superior left cerebellum. 4. Other scattered white matter disease is present bilaterally and within the brainstem. 5. Mild sinus disease.  6. The report of a brain MRI 12/27/2013 in [place] does not document any infarcts in the cerebellum, suggesting these are new.     2D echo 01/06/14 - Left ventricle: The cavity size was normal. Systolic function was normal. The estimated ejection fraction was in the range of 55% to 60%. Wall motion was normal; there were no regional wall motion abnormalities. - Mitral valve: There was trivial regurgitation. - Tricuspid valve: There was trivial regurgitation. - Pulmonary arteries: PA peak pressure: 37 mm Hg (S).  Impressions: - The right ventricular systolic pressure was increased consistent with mild pulmonary hypertension.   PHYSICAL EXAM  Temp:  [96.8 F (36 C)-98.8 F (37.1 C)] 97.9 F (36.6 C) (12/30 0923) Pulse Rate:  [76-98] 84 (12/30 0923) Resp:  [16-19] 16 (12/30 0923) BP: (125-167)/(62-86) 145/72 mmHg (12/30 0923) SpO2:  [96 %-100 %] 98 % (12/30 0923) Weight:  [154 lb 8.7 oz (70.1 kg)] 154 lb 8.7 oz (70.1 kg) (12/30 0522)  General - Well nourished, well developed, in no apparent distress.  Ophthalmologic - not able to see through due to cataracts.  Cardiovascular - Regular rate and rhythm with no murmur.  Mental Status -  Level of arousal and orientation to time, place, and person were intact. Expression and comprehension was assessed and found intact, mild naming repetition difficulty, moderate dysarthria.  Cranial Nerves II - XII - II - Visual field intact OU. III, IV, VI - Extraocular movements intact. V - Facial sensation intact bilaterally. VII - Facial movement intact bilaterally. VIII - Hearing & vestibular intact bilaterally. X - Palate elevates symmetrically. XI - Chin turning & shoulder shrug intact bilaterally. XII - Tongue  protrusion intact.  Motor Strength - The patient's strength was normal in all extremities except mild right dexterity difficulty and pronator drift was absent.  Bulk was normal and fasciculations were absent.   Motor Tone - Muscle tone was assessed at the neck and appendages and was normal.  Reflexes - The patient's reflexes were normal in all extremities and he had no pathological reflexes.  Sensory - Light touch, temperature/pinprick were assessed and were normal.    Coordination - The patient had normal movements in the hands and feet with no ataxia or dysmetria.  Tremor was absent.  Gait and Station - deferred as he is working with OT.   ASSESSMENT/PLAN Mr. Cohan Stipes is a 80 y.o. male with history of HTN, hypercholesterolemia, left MCA branch distribution ischemic stroke 12/27/13 with residual mild dysarthria, PD, GERD presenting with dysarthria and ataxia. He did not receive IV t-PA due to minimal symptoms and recent stroke.   Stroke:  Recent L frontal infarct secondary to LV clot see on 2D echo, placed on  coumadin who developed new left cerebellar infarct in setting of subtherapeutic INR  Resultant  Dysarthria  MRI  New left cerebellar infarct, subacute L frontal infarct   2D Echo  Done 01/06/14 as OP, LV clot not seen  Rest of stroke workup completed in [place]  HgbA1c 6.0  IV heparin and coumadin for VTE prophylaxis  Diet Heart thin liquids  warfarin prior to admission with subtherapeutic INR at 1.4, now on warfarin and heparin. Son reports home lovenox bridge to coumadin, INR max at home 2.0, had stopped lovenox by time of admission. INR this am 1.52. We recommends start lovenox bridge again with stopping lovenox at INR 2.5   Close monitor INR at home.  Ongoing aggressive stroke risk factor management  Therapy recommendations:  Continue PT and OT at discharge.  Disposition:  Return home. Continue HH therapies and lab checks until INR therapeutic  Follow up Dr.  Erlinda Hong in 2 months (order written)  Hypertension  BP 121-171/95-97 past 24h (01/19/2014 @ 10:21 AM)   Stable  Hyperlipidemia  Home meds:  lipitor 20 and zetia, resumed in hospital  LDL 92, goal < 70  Continue statin at discharge  Other Stroke Risk Factors  Advanced age  Former Cigarette smoker  ETOH use  Hx stroke/TIA - 12/27/13 L frontal infarct d/t LV clot  Other Active Problems  Parkinson's disease  BPH  ARF secondary to dehydration  Hospital day # [place] for Pager information 01/19/2014 10:21 AM   I, the attending vascular neurologist, have personally obtained a history, examined the patient, evaluated laboratory data, individually viewed imaging studies and agree with radiology interpretations. I also discussed with Dr. Ree Kida regarding his care plan. Together with the NP/PA, we formulated the assessment and plan of care which reflects our mutual decision.  I have made any additions or clarifications directly to the above note and agree with the findings and plan as currently documented.   80 yo M with recent left MCA stroke likely due to LV thrombus seen on 2D echo, on coumadin was admitted for new left cerebellar stroke. INR on admission 1.47 and today 1.52. Stroke likely due to subtherapeutic INR. Will need to have lovenox bridge again until INR 2.5. Continue risk factor modification and close INR check.   Rosalin Hawking, MD PhD Stroke Neurology 01/19/2014 2:37 PM        To contact Stroke Continuity provider, please refer to [email or web address] After hours, contact General Neurology

## 2014-01-19 NOTE — Progress Notes (Signed)
MD paged regarding INR levels. Patient educated on administration of Lovenox injections.

## 2014-01-19 NOTE — Evaluation (Signed)
Occupational Therapy Evaluation Patient Details Name: Kyle Cameron MRN: [id number] DOB: [birth year] Today's Date: 01/19/2014    History of Present Illness Patient is a 80 y/o male admitted due to weakness. Pt with recent hx of CVA 2 weeks ago in [place] apparently was found to have intracardiac thrombus and was placed on Lovenox and Coumadin overlap. Pt then came to [facility] to live w/ his son and was doing fairly well when this morning he started experiencing some generalized weakness and had difficulty getting up. CT head: acute/subacute left posterior frontal-anterior parietal infarct. MRI brain-Acute/subacute infarct within the posterolateral left cerebellum. More acute linear area of infarction within the anteromedial sup left cerebellum.    Clinical Impression   Pt is at min guard A level for ADLs and ADL mobility. Pt demonstrates decreased FMC/dexterity in R hand/UE. Pt working with [facility] OT PTA. Per MD, pt to d/c home today with his son and continue with HH OT/PT. No further acute OT services indicated at this time    Follow Up Recommendations  Home health OT    Equipment Recommendations    none   Recommendations for Other Services       Precautions / Restrictions Precautions Precautions: Fall Precaution Comments: hx of Parkinsons Restrictions Weight Bearing Restrictions: No      Mobility Bed Mobility Overal bed mobility: Needs Assistance Bed Mobility: Sit to Supine     Supine to sit: Supervision     General bed mobility comments: Supervision for safety. Use of rails.   Transfers Overall transfer level: Needs assistance Equipment used: Rolling walker (2 wheeled) Transfers: Sit to/from Stand Sit to Stand: Min guard         General transfer comment: Min guard for safety. Impulsive at times. Stood from [employer], from toilet x1. Transferred to chair with hand held assist.     Balance Overall balance assessment: Needs assistance Sitting-balance support: No  upper extremity supported;Feet supported Sitting balance-Leahy Scale: Fair     Standing balance support: During functional activity;Single extremity supported Standing balance-Leahy Scale: Fair Standing balance comment: Performed dynamic standing activities at sink - brushing teeth, hair and washing face with 1 UE support, No LOB.                             ADL Overall ADL's : Needs assistance/impaired     Grooming: Wash/dry hands;Wash/dry face;Min guard;Standing   Upper Body Bathing: Standing;Min guard   Lower Body Bathing: Min guard;Sit to/from stand   Upper Body Dressing : Min guard;Standing   Lower Body Dressing: Min guard;Sit to/from stand   Toilet Transfer: Min guard;Regular Toilet;Grab bars;Cueing for safety   Toileting- Water quality scientist and Hygiene: Min guard;Sit to/from stand   Tub/ Shower Transfer: Min guard;Shower seat;Ambulation;Grab bars;Cueing for safety   Functional mobility during ADLs: Min guard;Cueing for safety       Vision  no change from baseline                   Perception Perception Perception Tested?: Yes   Praxis Praxis Praxis tested?: Within functional limits    Pertinent Vitals/Pain Pain Assessment: No/denies pain     Hand Dominance Right   Extremity/Trunk Assessment Upper Extremity Assessment Upper Extremity Assessment: RUE deficits/detail;Generalized weakness RUE Coordination: decreased fine motor   Lower Extremity Assessment Lower Extremity Assessment: Generalized weakness   Cervical / Trunk Assessment Cervical / Trunk Assessment: Normal   Communication Communication Communication: Expressive difficulties   Cognition  Arousal/Alertness: Awake/alert Behavior During Therapy: Impulsive;Anxious;WFL for tasks assessed/performed Overall Cognitive Status: Impaired/Different from baseline Area of Impairment: Safety/judgement;Problem solving         Safety/Judgement: Decreased awareness of safety    Problem Solving: Decreased initiation;Requires verbal cues     General Comments   pt pleasant and cooperative                 Home Living Family/patient expects to be discharged to:: Private residence Living Arrangements: Children Available Help at Discharge: Family;Available 24 hours/day Type of Home: House Home Access: Stairs to enter [employer] of Steps: 3 Entrance Stairs-Rails: Right Home Layout: Two level;Able to live on main level with bedroom/bathroom     Bathroom Shower/Tub: Tub/shower unit;Walk-in shower (pt currently having bathroom remodeled and getting walk in shower (has shower seat ), grab bars)   Bathroom Toilet: Standard (pt currently having bathroom remodeled  and getting higher toilet seat)     Home Equipment: Walker - 2 wheels;Bedside commode;Shower seat          Prior Functioning/Environment Level of Independence: Needs assistance  Gait / Transfers Assistance Needed: Pt uses RW for ambulation. Getting HHPT/OT and speech since recent CVA in beginning of Dec. ADL's / Homemaking Assistance Needed: Mostly (I) with ADLS however requires assist with getting in/out of tub for safety.         OT Diagnosis: Generalized weakness   OT Problem List: Decreased safety awareness;Impaired balance (sitting and/or standing);Decreased coordination   OT Treatment/Interventions:      OT Goals(Current goals can be found in the care plan section) Acute Rehab OT Goals Patient Stated Goal: to return home  OT Frequency:     Barriers to D/C:    none                     End of Session Equipment Utilized During Treatment: Rolling walker  Activity Tolerance: Patient tolerated treatment well Patient left: in bed;with call bell/phone within reach;with family/visitor present   Time: 8757-9728 OT Time Calculation (min): 38 min Charges:  OT General Charges $OT Visit: 1 Procedure OT Evaluation $Initial OT Evaluation Tier I: 1 Procedure OT  Treatments $Self Care/Home Management : 8-22 mins $Therapeutic Activity: 8-22 mins G-Codes:    Britt Bottom 01/19/2014, 1:01 PM

## 2014-01-19 NOTE — Progress Notes (Signed)
Patient discharged to his home with son. Discharge instructions reviewed and discussed with patient and his son. Both state they understand both sets of instructions. IV removed. Patient denies any pain.

## 2014-01-19 NOTE — Progress Notes (Signed)
ANTICOAGULATION CONSULT NOTE - Follow Up Consult  Pharmacy Consult for heparin and warfarin Indication: LV thrombus  No Known Allergies  Patient Measurements: Height: 5\' 6"  (167.6 cm) Weight: 154 lb 8.7 oz (70.1 kg) IBW/kg (Calculated) : 63.8 Heparin Dosing Weight: 71 kg  Vital Signs: Temp: 97.9 F (36.6 C) (12/30 0923) Temp Source: Oral (12/30 0923) BP: 145/72 mmHg (12/30 0923) Pulse Rate: 84 (12/30 0923)  Labs:  Recent Labs  01/18/14 0925 01/19/14 0100 01/19/14 1131  HGB 14.8 13.6  --   HCT 45.8 41.8  --   PLT 399 392  --   APTT 33  --   --   LABPROT 18.0*  --  18.4*  INR 1.47  --  1.52*  HEPARINUNFRC  --  0.25* 0.50  CREATININE 1.50* 1.18  --     Estimated Creatinine Clearance: 41.3 mL/min (by C-G formula based on Cr of 1.18).   Medications:  Infusions:  . sodium chloride 75 mL/hr at 01/18/14 1815    Assessment: 80 y/o male with recent L frontal stroke and LV thrombus placed on warfarin. He was admitted with new L cerebellar stroke in the setting of a subtherapeutic INR. Pharmacy managing IV heparin and warfarin inpatient.  Heparin level is therapeutic at 0.5 on 1000 units/hr. INR is subtherapeutic at 1.52. No bleeding is noted, CBC is stable.  PTA: warfarin 5 mg daily   Goal of Therapy:  INR 2-3 Heparin level 0.3-0.5 units/ml Monitor platelets by anticoagulation protocol: Yes   Plan:  - Warfarin 7.5 mg PO tonight - Continue heparin drip at 1000 units/hr - Confirmatory heparin level this evening - Daily heparin level, CBC, INR - Monitor for s/sx of bleeding  [facility], [place] Hotront[place].D., BCPS Clinical Pharmacist Pager: [phone] 01/19/2014 1:01 PM

## 2014-01-19 NOTE — Evaluation (Signed)
Physical Therapy Evaluation Patient Details Name: Kyle Cameron MRN: [id number] DOB: 09/28/1926 Today's Date: 01/19/2014   History of Present Illness  Patient is a 80 y/o male admitted due to weakness. Pt with recent hx of CVA 2 weeks ago in [place] apparently was found to have intracardiac thrombus and was placed on Lovenox and Coumadin overlap. Pt then came to [facility] to live w/ his son and was doing fairly well when this morning he started experiencing some generalized weakness and had difficulty getting up. CT head: acute/subacute left posterior frontal-anterior parietal infarct. MRI brain-Acute/subacute infarct within the posterolateral left cerebellum. More acute linear area of infarction within the anteromedial sup left cerebellum.     Clinical Impression  Patient presents with functional limitations due to deficits listed in PT problem list (see below). Pt with generalized weakness, balance deficits secondary to hx of PD and impaired safety awareness impacting safe mobility. Pt very upset about having lines and not able to move about independently- mildly impulsive. Pt fall risk. Pt would benefit from skilled PT and continued HHPT to improve gait, balance and overall mobility so pt can maximize independence and minimize fall risk.    Follow Up Recommendations Home health PT;Supervision/Assistance - 24 hour    Equipment Recommendations  None recommended by PT    Recommendations for Other Services       Precautions / Restrictions Precautions Precautions: Fall Precaution Comments: hx of Parkinsons Restrictions Weight Bearing Restrictions: No      Mobility  Bed Mobility Overal bed mobility: Needs Assistance Bed Mobility: Supine to Sit     Supine to sit: Supervision;HOB elevated     General bed mobility comments: Supervision for safety. Use of rails.   Transfers Overall transfer level: Needs assistance Equipment used: Rolling walker (2 wheeled) Transfers: Sit  to/from Stand Sit to Stand: Min guard         General transfer comment: Min guard for safety. Impulsive at times. Stood from [employer], from toilet x1. Transferred to chair with hand held assist.   Ambulation/Gait Ambulation/Gait assistance: Min guard Ambulation Distance (Feet): 200 Feet Assistive device: Rolling walker (2 wheeled) Gait Pattern/deviations: Step-through pattern;Decreased stride length;Shuffle;Trunk flexed   Gait velocity interpretation: Below normal speed for age/gender General Gait Details: Pt with slow, mildly unsteady gait. Some freezing episodes noted esp with changes in direction or tile patterns. Responds well to "Take big steps." IMpulsive at times.   Stairs            Wheelchair Mobility    Modified Rankin (Stroke Patients Only)       Balance Overall balance assessment: Needs assistance Sitting-balance support: Feet supported;No upper extremity supported Sitting balance-Leahy Scale: Fair     Standing balance support: During functional activity;Single extremity supported Standing balance-Leahy Scale: Fair Standing balance comment: Performed dynamic standing activities at sink - brushing teeth, hair and washing face with 1 UE support, No LOB.                              Pertinent Vitals/Pain Pain Assessment: No/denies pain    Home Living Family/patient expects to be discharged to:: Private residence Living Arrangements: Children Available Help at Discharge: Family;Available 24 hours/day Type of Home: House Home Access: Stairs to enter Entrance Stairs-Rails: Right Entrance Stairs-Number of Steps: 3 Home Layout: Two level;Able to live on main level with bedroom/bathroom Home Equipment: Gilford Rile - 2 wheels;Bedside commode;Shower seat      Prior Function Level of  Independence: Needs assistance   Gait / Transfers Assistance Needed: Pt uses RW for ambulation. Getting HHPT/OT and speech since recent CVA in beginning of Dec.  ADL's /  Homemaking Assistance Needed: Mostly (I) with ADLS however requires assist with getting in/out of tub for safety.         Hand Dominance   Dominant Hand: Right    Extremity/Trunk Assessment   Upper Extremity Assessment: Overall WFL for tasks assessed;Generalized weakness           Lower Extremity Assessment: Generalized weakness         Communication   Communication: Expressive difficulties (dysarthria)  Cognition Arousal/Alertness: Awake/alert Behavior During Therapy: Impulsive;Anxious;WFL for tasks assessed/performed Overall Cognitive Status: Impaired/Different from baseline Area of Impairment: Safety/judgement;Problem solving         Safety/Judgement: Decreased awareness of safety   Problem Solving: Decreased initiation;Requires verbal cues      General Comments      Exercises        Assessment/Plan    PT Assessment Patient needs continued PT services  PT Diagnosis Abnormality of gait;Generalized weakness   PT Problem List Decreased strength;Decreased cognition;Decreased balance;Decreased safety awareness;Decreased mobility  PT Treatment Interventions Gait training;Balance training;Patient/family education;Functional mobility training;Therapeutic activities;Therapeutic exercise;Stair training;Neuromuscular re-education   PT Goals (Current goals can be found in the Care Plan section) Acute Rehab PT Goals Patient Stated Goal: to return home PT Goal Formulation: With patient Time For Goal Achievement: 02/02/14 Potential to Achieve Goals: Good    Frequency Min 3X/week   Barriers to discharge        Co-evaluation               End of Session Equipment Utilized During Treatment: Gait belt Activity Tolerance: Patient tolerated treatment well Patient left: in chair;with call bell/phone within reach;with chair alarm set;with family/visitor present Nurse Communication: Mobility status    Functional Assessment Tool Used: Clinical  judgment Functional Limitation: Mobility: Walking and moving around Mobility: Walking and Moving Around Current Status [phone]): At least 1 percent but less than 20 percent impaired, limited or restricted Mobility: Walking and Moving Around Goal Status [phone]): At least 1 percent but less than 20 percent impaired, limited or restricted    Time: 0925-0954 PT Time Calculation (min) (ACUTE ONLY): 29 min   Charges:   PT Evaluation $Initial PT Evaluation Tier I: 1 Procedure PT Treatments $Gait Training: 8-22 mins   PT G Codes:   PT G-Codes **NOT FOR INPATIENT CLASS** Functional Assessment Tool Used: Clinical judgment Functional Limitation: Mobility: Walking and moving around Mobility: Walking and Moving Around Current Status ([id number]): At least 1 percent but less than 20 percent impaired, limited or restricted Mobility: Walking and Moving Around Goal Status [phone]): At least 1 percent but less than 20 percent impaired, limited or restricted    Candy Sledge A 01/19/2014, 10:11 AM Candy Sledge, PT, DPT [phone]

## 2014-01-19 NOTE — Discharge Instructions (Signed)
STROKE/TIA DISCHARGE INSTRUCTIONS SMOKING Cigarette smoking nearly doubles your risk of having a stroke & is the single most alterable risk factor  If you smoke or have smoked in the last 12 months, you are advised to quit smoking for your health.  Most of the excess cardiovascular risk related to smoking disappears within a year of stopping.  Ask you doctor about anti-smoking medications  [place] Quit Line: 1-800-QUIT NOW  Free Smoking Cessation Classes [phone]  CHOLESTEROL Know your levels; limit fat & cholesterol in your diet  Lipid Panel     Component Value Date/Time   CHOL 157 01/19/2014 0100   TRIG 41 01/19/2014 0100   HDL 57 01/19/2014 0100   CHOLHDL 2.8 01/19/2014 0100   VLDL 8 01/19/2014 0100   LDLCALC 92 01/19/2014 0100      Many patients benefit from treatment even if their cholesterol is at goal.  Goal: Total Cholesterol (CHOL) less than 160  Goal:  Triglycerides (TRIG) less than 150  Goal:  HDL greater than 40  Goal:  LDL (LDLCALC) less than 100   BLOOD PRESSURE American Stroke Association blood pressure target is less that 120/80 mm/Hg  Your discharge blood pressure is:  BP: (!) 145/72 mmHg  Monitor your blood pressure  Limit your salt and alcohol intake  Many individuals will require more than one medication for high blood pressure  DIABETES (A1c is a blood sugar average for last 3 months) Goal HGBA1c is under 7% (HBGA1c is blood sugar average for last 3 months)  Diabetes: No known diagnosis of diabetes    Lab Results  Component Value Date   HGBA1C 6.0* 01/19/2014     Your HGBA1c can be lowered with medications, healthy diet, and exercise.  Check your blood sugar as directed by your physician  Call your physician if you experience unexplained or low blood sugars.  PHYSICAL ACTIVITY/REHABILITATION Goal is 30 minutes at least 4 days per week  Activity: Increase activity slowly,, No driving, and Walk with assistance, Therapies: Physical Therapy:  Home Health and Occupational Therapy: Home Health   Activity decreases your risk of heart attack and stroke and makes your heart stronger.  It helps control your weight and blood pressure; helps you relax and can improve your mood.  Participate in a regular exercise program.  Talk with your doctor about the best form of exercise for you (dancing, walking, swimming, cycling).  DIET/WEIGHT Goal is to maintain a healthy weight  Your discharge diet is: Diet Heart, thin liquids Your height is:  Height: 5\' 6"  (167.6 cm) Your current weight is: Weight: 70.1 kg (154 lb 8.7 oz) Your Body Mass Index (BMI) is:  BMI (Calculated): 25.4  Following the type of diet specifically designed for you will help prevent another stroke.  Your goal Body Mass Index (BMI) is 19-24.  Healthy food habits can help reduce 3 risk factors for stroke:  High cholesterol, hypertension, and excess weight.  RESOURCES Stroke/Support Group:  Call [phone]   STROKE EDUCATION PROVIDED/REVIEWED AND GIVEN TO PATIENT Stroke warning signs and symptoms How to activate emergency medical system (call 911). Medications prescribed at discharge. Need for follow-up after discharge. Personal risk factors for stroke. Pneumonia vaccine given: No Flu vaccine given: No My questions have been answered, the writing is legible, and I understand these instructions.  I will adhere to these goals & educational materials that have been provided to me after my discharge from the hospital.    Fat and Cholesterol Control Diet Fat and  cholesterol levels in your blood and organs are influenced by your diet. High levels of fat and cholesterol may lead to diseases of the heart, small and large blood vessels, gallbladder, liver, and pancreas. CONTROLLING FAT AND CHOLESTEROL WITH DIET Although exercise and lifestyle factors are important, your diet is key. That is because certain foods are known to raise cholesterol and others to lower it. The goal is  to balance foods for their effect on cholesterol and more importantly, to replace saturated and trans fat with other types of fat, such as monounsaturated fat, polyunsaturated fat, and omega-3 fatty acids. On average, a person should consume no more than 15 to 17 g of saturated fat daily. Saturated and trans fats are considered "bad" fats, and they will raise LDL cholesterol. Saturated fats are primarily found in animal products such as meats, butter, and cream. However, that does not mean you need to give up all your favorite foods. Today, there are good tasting, low-fat, low-cholesterol substitutes for most of the things you like to eat. Choose low-fat or nonfat alternatives. Choose round or loin cuts of red meat. These types of cuts are lowest in fat and cholesterol. Chicken (without the skin), fish, veal, and ground [place] breast are great choices. Eliminate fatty meats, such as hot dogs and salami. Even shellfish have little or no saturated fat. Have a 3 oz (85 g) portion when you eat lean meat, poultry, or fish. Trans fats are also called "partially hydrogenated oils." They are oils that have been scientifically manipulated so that they are solid at room temperature resulting in a longer shelf life and improved taste and texture of foods in which they are added. Trans fats are found in stick margarine, some tub margarines, cookies, crackers, and baked goods.  When baking and cooking, oils are a great substitute for butter. The monounsaturated oils are especially beneficial since it is believed they lower LDL and raise HDL. The oils you should avoid entirely are saturated tropical oils, such as coconut and palm.  Remember to eat a lot from food groups that are naturally free of saturated and trans fat, including fish, fruit, vegetables, beans, grains (barley, rice, couscous, bulgur wheat), and pasta (without cream sauces).  IDENTIFYING FOODS THAT LOWER FAT AND CHOLESTEROL  Soluble fiber may lower your  cholesterol. This type of fiber is found in fruits such as apples, vegetables such as broccoli, potatoes, and carrots, legumes such as beans, peas, and lentils, and grains such as barley. Foods fortified with plant sterols (phytosterol) may also lower cholesterol. You should eat at least 2 g per day of these foods for a cholesterol lowering effect.  Read package labels to identify low-saturated fats, trans fat free, and low-fat foods at the supermarket. Select cheeses that have only 2 to 3 g saturated fat per ounce. Use a heart-healthy tub margarine that is free of trans fats or partially hydrogenated oil. When buying baked goods (cookies, crackers), avoid partially hydrogenated oils. Breads and muffins should be made from whole grains (whole-wheat or whole oat flour, instead of "flour" or "enriched flour"). Buy non-creamy canned soups with reduced salt and no added fats.  FOOD PREPARATION TECHNIQUES  Never deep-fry. If you must fry, either stir-fry, which uses very little fat, or use non-stick cooking sprays. When possible, broil, bake, or roast meats, and steam vegetables. Instead of putting butter or margarine on vegetables, use lemon and herbs, applesauce, and cinnamon (for squash and sweet potatoes). Use nonfat yogurt, salsa, and low-fat dressings for  salads.  LOW-SATURATED FAT / LOW-FAT FOOD SUBSTITUTES Meats / Saturated Fat (g)  Avoid: Steak, marbled (3 oz/85 g) / 11 g  Choose: Steak, lean (3 oz/85 g) / 4 g  Avoid: Hamburger (3 oz/85 g) / 7 g  Choose: Hamburger, lean (3 oz/85 g) / 5 g  Avoid: Ham (3 oz/85 g) / 6 g  Choose: Ham, lean cut (3 oz/85 g) / 2.4 g  Avoid: Chicken, with skin, dark meat (3 oz/85 g) / 4 g  Choose: Chicken, skin removed, dark meat (3 oz/85 g) / 2 g  Avoid: Chicken, with skin, light meat (3 oz/85 g) / 2.5 g  Choose: Chicken, skin removed, light meat (3 oz/85 g) / 1 g Dairy / Saturated Fat (g)  Avoid: Whole milk (1 cup) / 5 g  Choose: Low-fat milk, 2% (1 cup)  / 3 g  Choose: Low-fat milk, 1% (1 cup) / 1.5 g  Choose: Skim milk (1 cup) / 0.3 g  Avoid: Hard cheese (1 oz/28 g) / 6 g  Choose: Skim milk cheese (1 oz/28 g) / 2 to 3 g  Avoid: Cottage cheese, 4% fat (1 cup) / 6.5 g  Choose: Low-fat cottage cheese, 1% fat (1 cup) / 1.5 g  Avoid: Ice cream (1 cup) / 9 g  Choose: Sherbet (1 cup) / 2.5 g  Choose: Nonfat frozen yogurt (1 cup) / 0.3 g  Choose: Frozen fruit bar / trace  Avoid: Whipped cream (1 tbs) / 3.5 g  Choose: Nondairy whipped topping (1 tbs) / 1 g Condiments / Saturated Fat (g)  Avoid: Mayonnaise (1 tbs) / 2 g  Choose: Low-fat mayonnaise (1 tbs) / 1 g  Avoid: Butter (1 tbs) / 7 g  Choose: Extra light margarine (1 tbs) / 1 g  Avoid: Coconut oil (1 tbs) / 11.8 g  Choose: Olive oil (1 tbs) / 1.8 g  Choose: Corn oil (1 tbs) / 1.7 g  Choose: Safflower oil (1 tbs) / 1.2 g  Choose: Sunflower oil (1 tbs) / 1.4 g  Choose: Soybean oil (1 tbs) / 2.4 g  Choose: Canola oil (1 tbs) / 1 g Document Released: 01/07/2005 Document Revised: 05/04/2012 Document Reviewed: 04/07/2013 Exit[employer] Patient Information 2015 [place]. This information is not intended to replace advice given to you by your health care provider. Make sure you discuss any questions you have with your health care provider.

## 2014-01-19 NOTE — Discharge Summary (Signed)
Physician Discharge Summary  K[place] Cameron QIW:[id number] DOB: Mar 31, 1926 DOA: 01/18/2014  PCP: Kyle Hams, MD  Admit date: 01/18/2014 Discharge date: 01/19/2014  Time spent: 45 minutes  Recommendations for Outpatient Follow-up:  Patient will be discharged to home with home health services, PT, OT, nursing.  Patient will need to followup with his primary care physician within 1 week.  Patient will also need to followup with neurology in 96months of discharge.  He will be discharged with lovenox injections and should continue until his INR is 2.5.  This was discussed with the patient and his POA.  Patient may continue a heart healthy diet and activity as tolertated.  He will need to continue his medications as prescribed.  Discharge Diagnoses:  Principal Problem:   CVA (cerebral infarction) Active Problems:   Hypertension   Hyperlipidemia   Abnormality of gait   Imbalance   Acute kidney injury  Discharge Condition: Stable  Diet recommendation: Heart healthy  Filed Weights   01/18/14 0857 01/19/14 0522  Weight: 71.215 kg (157 lb) 70.1 kg (154 lb 8.7 oz)    History of present illness:  On 01/18/2014 by Dr. Lala Lund Derreon Cameron is a 80 y.o. male, with recent history of stroke which happened in [place] 2 weeks ago, apparently he was found to have intracardiac thrombus and was placed on Lovenox and Coumadin overlap, he then came to [place] where he lives with his son, he was doing fairly well but this morning he started experiencing some generalized weakness and had difficulty getting up, his gait was unsteady, he was then brought to the ER where his workup was not suggestive of a new stroke, he was seen by an neurologist and hospitalist team was requested to admit the patient for generalized weakness possible TIA versus small stroke. Patient currently denies any headache fever chills, no chest abdominal pain, no diarrhea or dysuria, no focal weakness. He does feel  that his legs are weak, he slightly anxious. Otherwise unremarkable review of systems.  Hospital Course:  Stroke, Acute -Patient recently had CVA 2 weeks ago while in [place]. -He presented for weakness, which seems improved -MRI: new Left cerebellar infarct, subacute left frontal infarct -Echocardiogram 01/06/2014 as an outpatient: Showed no LV clot -Patient's stroke workup was conducted in [place] -Hemoglobin A1c 6 -Initially placed on IV heparin with Coumadin bridge -Will be discharged with Lovenox injections along with Coumadin bridge, call INR 2.5 -Neurology consulted and appreciated and also recommended INR goal of 2.5 -Patient is to continue OT and PT at discharge -He will need to have his INR monitored within 3 days of discharge -Patient will need a follow-up with [place] in 2 months.  Essential hypertension -Stable, continue Hyzaar  Recent intracardiac thrombus -Continue Lovenox and Coumadin -Spoke with patient and care giver regarding diet.  Hyperlipidemia -LDL 92, goal less than 70 -Continue Lipitor and zetia  Parkinson's disease -Stable, continue home medications  BPH -Continue Avodart  Acute renal failure secondary to dehydration -Improved, creatinine 1.18  Hypokalemia -Replaced  Procedures:   Consultations: Neurology  Discharge Exam: Filed Vitals:   01/19/14 0923  BP: 145/72  Pulse: 84  Temp: 97.9 F (36.6 C)  Resp: 16     General: Well developed, well nourished, NAD, appears stated age  HEENT: NCAT, mucous membranes moist.  Cardiovascular: S1 S2 auscultated, Regular rate and rhythm.  Respiratory: Clear to auscultation bilaterally with equal chest rise  Abdomen: Soft, nontender, nondistended, + bowel sounds  Extremities: warm dry without cyanosis clubbing or  edema  Neuro: AAOx3, no focal deficits  Psych: Normal affect and demeanor with intact judgement and insight  Discharge Instructions      Discharge Instructions     Ambulatory referral to Neurology    Complete by:  As directed   K[place] requests followup in 2 months     Discharge instructions    Complete by:  As directed   Patient will be discharged to home with home health services, PT, OT, nursing.  Patient will need to followup with his primary care physician within 1 week.  Patient will also need to followup with neurology in 73months of discharge.  He will be discharged with lovenox injections and should continue until his INR is 2.5.  This was discussed with the patient and his POA.  Patient may continue a heart healthy diet and activity as tolertated.  He will need to continue his medications as prescribed.            Medication List    TAKE these medications        acetaminophen 500 MG tablet  Commonly known as:  TYLENOL  Take 500 mg by mouth every 8 (eight) hours as needed for mild pain or moderate pain.     atorvastatin 20 MG tablet  Commonly known as:  LIPITOR  Take 20 mg by mouth daily.     AVODART 0.5 MG capsule  Generic drug:  dutasteride  Take 0.5 mg by mouth every other day.     carbidopa-levodopa 25-100 MG per tablet  Commonly known as:  SINEMET IR  TAKE HALF TABLET BY MOUTH THREE TIMES DAILY     cholecalciferol 1000 UNITS tablet  Commonly known as:  VITAMIN D  Take 1,000 Units by mouth daily.     enoxaparin 80 MG/0.8ML injection  Commonly known as:  LOVENOX  Inject 0.7 mLs (70 mg total) into the skin every 12 (twelve) hours.     ezetimibe 10 MG tablet  Commonly known as:  ZETIA  Take 10 mg by mouth daily.     guaiFENesin-dextromethorphan 100-10 MG/5ML syrup  Commonly known as:  ROBITUSSIN DM  Take 5 mLs by mouth every 4 (four) hours as needed for cough.     levothyroxine 50 MCG tablet  Commonly known as:  SYNTHROID, LEVOTHROID  Take 50 mcg by mouth daily before breakfast.     losartan-hydrochlorothiazide 100-25 MG per tablet  Commonly known as:  HYZAAR  Take 1 tablet by mouth daily.     multivitamin with  minerals Tabs tablet  Take 1 tablet by mouth daily. Centrum 50 plus     selegiline 5 MG tablet  Commonly known as:  ELDEPRYL  TAKE ONE TABLET TWICE DAILY WITH MEALS. TAKE WITH BREAKFAST AND LUNCH.     warfarin 5 MG tablet  Commonly known as:  COUMADIN  Take 5 mg by mouth daily.       No Known Allergies Follow-up Information    Follow up with Xu,Jindong, MD In 2 months.   Specialty:  Neurology   Why:  Stroke Clinic, Office will call you with appointment date & time   Contact information:   [address] [place] [address] [phone]       Follow up with [facility].   Why:  they will continue your services with home health care   Contact information:   [place] [address] [phone]       Follow up with POLITE,RONALD  D, MD. Schedule an appointment as soon as possible for a visit in 1 week.   Specialty:  Internal Medicine   Why:  Hospital followup, INR monitoring   Contact information:   [address] [place] [address] [phone]        The results of significant diagnostics from this hospitalization (including imaging, microbiology, ancillary and laboratory) are listed below for reference.    Significant Diagnostic Studies: Dg Chest 2 View  01/18/2014   CLINICAL DATA:  Hypertension, hypercholesterolemia.  Screening.  EXAM: CHEST  2 VIEW  COMPARISON:  02/03/2012  FINDINGS: The heart size and mediastinal contours are within normal limits. Both lungs are clear. The visualized skeletal structures are unremarkable.  IMPRESSION: No active cardiopulmonary disease.   Electronically Signed   By: Rolm Baptise M.D.   On: 01/18/2014 15:43   Ct Head Wo Contrast  01/18/2014   CLINICAL DATA:  Increased slurred speech this morning.  EXAM: CT HEAD WITHOUT CONTRAST  TECHNIQUE: Contiguous axial images were obtained from the base of the skull through the vertex without intravenous contrast.  COMPARISON:   MRI 03/18/2012  FINDINGS: There is low-density noted in the left posterior frontal lobe and anterior parietal lobe compatible with acute to subacute infarct. No hemorrhage. No additional acute infarction. No hydrocephalus or mass lesion.  Mild mucosal thickening in the ethmoid air cells. No air-fluid levels. Mastoid air cells are clear.  IMPRESSION: Acute to subacute left posterior  frontal/anterior parietal infarct.   Electronically Signed   By: Rolm Baptise M.D.   On: 01/18/2014 09:52   Mr Brain Wo Contrast  01/18/2014   ADDENDUM REPORT: 01/18/2014 15:20  ADDENDUM: The report of a brain MRI 12/27/2013 in [place] does not document any infarcts in the cerebellum, suggesting these are new.   Electronically Signed   By: Lawrence Santiago M.D.   On: 01/18/2014 15:20   01/18/2014   CLINICAL DATA:  Dysarthria and ataxia.  EXAM: MRI HEAD WITHOUT CONTRAST  TECHNIQUE: Multiplanar, multiecho pulse sequences of the brain and surrounding structures were obtained without intravenous contrast.  COMPARISON:  CT head without contrast 01/18/2014.  FINDINGS: The diffusion-weighted images confirm an acute/subacute infarct within the posterior left frontal lobe. Cortical T1 shortening is compatible with cortical laminar necrosis. There is cortical susceptibility which may related to the cortical laminar necrosis or possibly venous congestion. There is no definite hemorrhage.  An area of acute infarction is also noted within the anteromedial left superior cerebellum. A subacute area of infarction is noted bilaterally within the left cerebellar hemisphere. There is no hemorrhage associated with this lesion. T2 changes are noted at all three sites of acute and subacute infarction. Mild periventricular and subcortical white matter changes are noted as well. There is mild generalized atrophy, likely within normal limits for age. Central white matter changes are noted within the midbrain and pons. Focal white matter changes noted  within the posterior limb of the left internal capsule or lateral thalamus.  Flow is present in the major intracranial arteries. The patient is status post bilateral lens replacements. Mild mucosal thickening is scattered throughout the ethmoid air cells and in the inferior maxillary sinuses bilaterally. The remaining paranasal sinuses and the mastoid air cells are clear.  IMPRESSION: 1. Acute/subacute infarct in the posterior left frontal lobe is confirmed. There is evidence of cortical laminar necrosis without definite hemorrhage. 2. Acute/subacute infarct within the posterolateral left cerebellum. 3. More acute linear area of infarction within the anteromedial superior  left cerebellum. 4. Other scattered white matter disease is present bilaterally and within the brainstem. 5. Mild sinus disease.  Electronically Signed: By: Lawrence Santiago M.D. On: 01/18/2014 14:47   Mr Outside Films Head/face  01/18/2014   This examination belongs to an outside facility and is stored  here for comparison purposes only.  Contact the originating outside  institution for any associated report or interpretation.  Mr Outside Films Head/face  01/18/2014   This examination belongs to an outside facility and is stored  here for comparison purposes only.  Contact the originating outside  institution for any associated report or interpretation.   Microbiology: No results found for this or any previous visit (from the past 240 hour(s)).   Labs: Basic Metabolic Panel:  Recent Labs Lab 01/18/14 0925 01/19/14 0100  NA 139 139  K 3.5 3.3*  CL 101 105  CO2 29 26  GLUCOSE 102* 112*  BUN 30* 23  CREATININE 1.50* 1.18  CALCIUM 9.1 8.1*   Liver Function Tests:  Recent Labs Lab 01/18/14 0925  AST 25  ALT 33  ALKPHOS 43  BILITOT 0.7  PROT 6.3  ALBUMIN 3.6   No results for input(s): LIPASE, AMYLASE in the last 168 hours. No results for input(s): AMMONIA in the last 168 hours. CBC:  Recent Labs Lab  01/18/14 0925 01/19/14 0100  WBC 7.9 10.6*  HGB 14.8 13.6  HCT 45.8 41.8  MCV 88.9 86.0  PLT 399 392   Cardiac Enzymes: No results for input(s): CKTOTAL, CKMB, CKMBINDEX, TROPONINI in the last 168 hours. BNP: BNP (last 3 results) No results for input(s): PROBNP in the last 8760 hours. CBG: No results for input(s): GLUCAP in the last 168 hours.     SignedCristal Ford  [facility] 01/19/2014, 11:18 AM

## 2014-01-19 NOTE — Progress Notes (Signed)
[facility] for Heparin Indication: LV thrombus  No Known Allergies  Patient Measurements: Height: 5\' 6"  (167.6 cm) Weight: 157 lb (71.215 kg) IBW/kg (Calculated) : 63.8 Heparin Dosing Weight: 71 kg  Vital Signs: Temp: 98.6 F (37 C) (12/30 0223) Temp Source: Oral (12/30 0223) BP: 148/70 mmHg (12/30 0223) Pulse Rate: 80 (12/30 0223)  Labs:  Recent Labs  01/18/14 0925 01/19/14 0100  HGB 14.8 13.6  HCT 45.8 41.8  PLT 399 392  APTT 33  --   LABPROT 18.0*  --   INR 1.47  --   HEPARINUNFRC  --  0.25*  CREATININE 1.50* 1.18    Estimated Creatinine Clearance: 41.3 mL/min (by C-G formula based on Cr of 1.18).  Assessment: 80 yo male with CVA/LV thrombus for heparin  Goal of Therapy:  Heparin level 0.3-0.5 units/ml Monitor platelets by anticoagulation protocol: Yes   Plan:  Increase Heparin 1000 units/hr Check heparin level in 8 hours.   Phillis Knack, PharmD, BCPS  01/19/2014,3:21 AM

## 2014-01-24 ENCOUNTER — Ambulatory Visit (INDEPENDENT_AMBULATORY_CARE_PROVIDER_SITE_OTHER): Payer: Medicare Other | Admitting: Neurology

## 2014-01-24 ENCOUNTER — Encounter: Payer: Self-pay | Admitting: Neurology

## 2014-01-24 VITALS — BP 137/78 | HR 98

## 2014-01-24 DIAGNOSIS — R269 Unspecified abnormalities of gait and mobility: Secondary | ICD-10-CM

## 2014-01-24 DIAGNOSIS — G20 Parkinson's disease: Secondary | ICD-10-CM

## 2014-01-24 DIAGNOSIS — I6330 Cerebral infarction due to thrombosis of unspecified cerebral artery: Secondary | ICD-10-CM

## 2014-01-24 MED ORDER — CARBIDOPA-LEVODOPA 25-100 MG PO TABS: 1.0000 | ORAL_TABLET | Freq: Three times a day (TID) | ORAL | Status: DC

## 2014-01-24 NOTE — Patient Instructions (Signed)
Stroke Prevention Some medical conditions and behaviors are associated with an increased chance of having a stroke. You may prevent a stroke by making healthy choices and managing medical conditions. HOW CAN I REDUCE MY RISK OF HAVING A STROKE?   Stay physically active. Get at least 30 minutes of activity on most or all days.  Do not smoke. It may also be helpful to avoid exposure to secondhand smoke.  Limit alcohol use. Moderate alcohol use is considered to be:  No more than 2 drinks per day for men.  No more than 1 drink per day for nonpregnant women.  Eat healthy foods. This involves:  Eating 5 or more servings of fruits and vegetables a day.  Making dietary changes that address high blood pressure (hypertension), high cholesterol, diabetes, or obesity.  Manage your cholesterol levels.  Making food choices that are high in fiber and low in saturated fat, trans fat, and cholesterol may control cholesterol levels.  Take any prescribed medicines to control cholesterol as directed by your health care provider.  Manage your diabetes.  Controlling your carbohydrate and sugar intake is recommended to manage diabetes.  Take any prescribed medicines to control diabetes as directed by your health care provider.  Control your hypertension.  Making food choices that are low in salt (sodium), saturated fat, trans fat, and cholesterol is recommended to manage hypertension.  Take any prescribed medicines to control hypertension as directed by your health care provider.  Maintain a healthy weight.  Reducing calorie intake and making food choices that are low in sodium, saturated fat, trans fat, and cholesterol are recommended to manage weight.  Stop drug abuse.  Avoid taking birth control pills.  Talk to your health care provider about the risks of taking birth control pills if you are over 35 years old, smoke, get migraines, or have ever had a blood clot.  Get evaluated for sleep  disorders (sleep apnea).  Talk to your health care provider about getting a sleep evaluation if you snore a lot or have excessive sleepiness.  Take medicines only as directed by your health care provider.  For some people, aspirin or blood thinners (anticoagulants) are helpful in reducing the risk of forming abnormal blood clots that can lead to stroke. If you have the irregular heart rhythm of atrial fibrillation, you should be on a blood thinner unless there is a good reason you cannot take them.  Understand all your medicine instructions.  Make sure that other conditions (such as anemia or atherosclerosis) are addressed. SEEK IMMEDIATE MEDICAL CARE IF:   You have sudden weakness or numbness of the face, arm, or leg, especially on one side of the body.  Your face or eyelid droops to one side.  You have sudden confusion.  You have trouble speaking (aphasia) or understanding.  You have sudden trouble seeing in one or both eyes.  You have sudden trouble walking.  You have dizziness.  You have a loss of balance or coordination.  You have a sudden, severe headache with no known cause.  You have new chest pain or an irregular heartbeat. Any of these symptoms may represent a serious problem that is an emergency. Do not wait to see if the symptoms will go away. Get medical help at once. Call your local emergency services (911 in [place] Do not drive yourself to the hospital. Document Released: 02/15/2004 Document Revised: 05/24/2013 Document Reviewed: 07/10/2012 ExitCare Patient Information 2015 [employer]. This information is not intended to replace advice given   to you by your health care provider. Make sure you discuss any questions you have with your health care provider.  

## 2014-01-24 NOTE — Progress Notes (Signed)
Reason for visit: Parkinson's disease, stroke  [place] is an 81 y.o. male  History of present illness:  [place] is an 81 year old right-handed white male with a history of Parkinson's disease. The patient has been on Sinemet in relatively low dose taking one half tablet 3 times daily to 25/100 mg tablets. He also is on selegiline taking 5 mg twice daily. He was visiting in [place] around 12/26/2013 when he had onset of problems with speech and with walking. He went to the emergency room, and was found to have a posterior left frontal stroke. He was found to have a mural thrombus, and he was placed on Coumadin therapy. The patient returned to [place], [place], but he suffered a recurring stroke while on Coumadin on 01/18/2014. The patient was found to have had a new left cerebellar stroke. The patient is now back home with home health physical, occupational, and speech therapy. He remains on Coumadin, and he is on Lovenox bridging. The INR with Coumadin actually dropped since his initial admission out in [place]. The patient is walking with a walker, but overall he feels very close to his usual baseline, but he still reports some difficulty with word finding. He is shuffling more with his walking. He has not had any falls. He denies problems with swallowing. He continues to have hypophonia with his Parkinson's disease.  Past Medical History  Diagnosis Date  . Hypertension   . High cholesterol   . Prostate atrophy   . GERD (gastroesophageal reflux disease)   . History of diverticulitis of colon   . Benign enlargement of prostate   . Hypothyroidism   . History of renal calculi   . Parkinson's disease   . Renal calculi   . CVA (cerebral infarction)     Past Surgical History  Procedure Laterality Date  . Cholecystectomy  02/04/2012    Procedure: LAPAROSCOPIC CHOLECYSTECTOMY WITH INTRAOPERATIVE CHOLANGIOGRAM;  Surgeon: Merrie Roof, MD;  Location: [facility];  Service:  General;  Laterality: N/A;  . Lithotripsy      renal calculi  . Tonsillectomy    . Cataract extraction, bilateral      Family History  Problem Relation Age of Onset  . Parkinsonism Mother   . Heart disease Father   . Esophageal cancer Sister     Social history:  reports that he has quit smoking. He has never used smokeless tobacco. He reports that he drinks alcohol. He reports that he does not use illicit drugs.   No Known Allergies  Medications:  Current Outpatient Prescriptions on File Prior to Visit  Medication Sig Dispense Refill  . acetaminophen (TYLENOL) 500 MG tablet Take 500 mg by mouth every 8 (eight) hours as needed for mild pain or moderate pain.    Marland Kitchen atorvastatin (LIPITOR) 20 MG tablet Take 20 mg by mouth daily.    . cholecalciferol (VITAMIN D) 1000 UNITS tablet Take 1,000 Units by mouth daily.    Marland Kitchen dutasteride (AVODART) 0.5 MG capsule Take 0.5 mg by mouth every other day.    . levothyroxine (SYNTHROID, LEVOTHROID) 50 MCG tablet Take 50 mcg by mouth daily before breakfast.    . losartan-hydrochlorothiazide (HYZAAR) 100-25 MG per tablet Take 1 tablet by mouth daily.    . Multiple Vitamin (MULTIVITAMIN WITH MINERALS) TABS Take 1 tablet by mouth daily. Centrum 50 plus    . selegiline (ELDEPRYL) 5 MG tablet TAKE ONE TABLET TWICE DAILY WITH MEALS. TAKE WITH BREAKFAST AND LUNCH. 60  tablet 6  . warfarin (COUMADIN) 5 MG tablet Take 5 mg by mouth daily. Take 7.5mg  daily    . enoxaparin (LOVENOX) 80 MG/0.8ML injection Inject 0.7 mLs (70 mg total) into the skin every 12 (twelve) hours. (Patient not taking: Reported on 01/24/2014) 20 Syringe 0   No current facility-administered medications on file prior to visit.    ROS:  Out of a complete 14 system review of symptoms, the patient complains only of the following symptoms, and all other reviewed systems are negative.  Walking difficulty Word finding problems  Blood pressure 137/78, pulse 98.  Physical Exam  General: The  patient is alert and cooperative at the time of the examination.  Skin: No significant peripheral edema is noted.   Neurologic Exam  Mental status: The patient is oriented x 3.  Cranial nerves: Facial symmetry is present. Speech is hypophonic, occasional word finding issues. Extraocular movements are full. Visual fields are full.  Motor: The patient has good strength in all 4 extremities.  Sensory examination: Soft touch sensation is symmetric on the face, arms, and legs.  Coordination: The patient has good finger-nose-finger and heel-to-shin bilaterally.  Gait and station: The patient is able to rise from a seated position with arms crossed, with some tendency to lean backwards. He currently is walking with a walker, has good stride, but he has shuffles with turns and with initiation of walking. Tandem gait was not attempted.  Reflexes: Deep tendon reflexes are symmetric.    2D echo 01/06/14:  Study Conclusions  - Left ventricle: The cavity size was normal. Systolic function was normal. The estimated ejection fraction was in the range of 55% to 60%. Wall motion was normal; there were no regional wall motion abnormalities. - Mitral valve: There was trivial regurgitation. - Tricuspid valve: There was trivial regurgitation. - Pulmonary arteries: PA peak pressure: 37 mm Hg (S).  Impressions:  - The right ventricular systolic pressure was increased consistent with mild pulmonary hypertension.   Assessment/Plan:  1. Parkinson's disease  2. Stroke event, left posterior frontal and left cerebellar  3. Mural thrombus, on Coumadin  The patient is getting ongoing therapy at this time. We will increase the Sinemet taking the 25/100 mg tablets 3 times daily. He is having increasing problems with freezing with walking and with turns. He will follow-up in 4-5 months. He will have follow-up as well with Dr. Smitty Cords MD 01/24/2014 8:23 PM  [facility] [address] [place], [place] [address]  Phone [phone] Fax [phone]

## 2014-02-21 ENCOUNTER — Ambulatory Visit: Payer: Medicare Other | Admitting: Neurology

## 2014-03-02 ENCOUNTER — Other Ambulatory Visit: Payer: Self-pay | Admitting: Neurology

## 2014-03-02 ENCOUNTER — Telehealth: Payer: Self-pay | Admitting: *Deleted

## 2014-03-02 DIAGNOSIS — I6340 Cerebral infarction due to embolism of unspecified cerebral artery: Secondary | ICD-10-CM

## 2014-03-02 NOTE — Telephone Encounter (Signed)
Received information stating that patient will need PT/OT orders in system, Jeri Modena [phone]. [place] patient but seen by Dr Erlinda Hong in hospital 12/2013

## 2014-03-02 NOTE — Telephone Encounter (Signed)
outpt PT/OT ordered. Thanks.  Rosalin Hawking, MD PhD Stroke Neurology 03/02/2014 4:23 PM

## 2014-03-15 ENCOUNTER — Ambulatory Visit (INDEPENDENT_AMBULATORY_CARE_PROVIDER_SITE_OTHER): Payer: Medicare Other | Admitting: Neurology

## 2014-03-15 ENCOUNTER — Encounter: Payer: Self-pay | Admitting: Neurology

## 2014-03-15 VITALS — BP 134/79 | HR 104 | Ht 66.0 in | Wt 150.0 lb

## 2014-03-15 DIAGNOSIS — I213 ST elevation (STEMI) myocardial infarction of unspecified site: Secondary | ICD-10-CM

## 2014-03-15 DIAGNOSIS — L819 Disorder of pigmentation, unspecified: Secondary | ICD-10-CM | POA: Diagnosis not present

## 2014-03-15 DIAGNOSIS — E785 Hyperlipidemia, unspecified: Secondary | ICD-10-CM | POA: Insufficient documentation

## 2014-03-15 DIAGNOSIS — I10 Essential (primary) hypertension: Secondary | ICD-10-CM

## 2014-03-15 DIAGNOSIS — G20 Parkinson's disease: Secondary | ICD-10-CM

## 2014-03-15 DIAGNOSIS — I6340 Cerebral infarction due to embolism of unspecified cerebral artery: Secondary | ICD-10-CM | POA: Insufficient documentation

## 2014-03-15 DIAGNOSIS — I513 Intracardiac thrombosis, not elsewhere classified: Secondary | ICD-10-CM

## 2014-03-15 NOTE — Progress Notes (Signed)
STROKE NEUROLOGY FOLLOW UP NOTE  NAME: Kyle Cameron DOB: 1926-08-21  REASON FOR VISIT: stroke follow up HISTORY FROM: pt and chart  Today we had the pleasure of seeing Kyle Cameron in follow-up at our Neurology Clinic. Pt was accompanied by no one.   History Summary Kyle Cameron is an 81 y.o. male with history of HTN, HLD, PD following up with Dr. Jannifer Franklin in our practice taking sinemet and selegiline. He was visiting in [place] around 12/26/2013 when he had acute onset right hemiparesis and expressive aphasia. He went to the emergency room, and was found to have a posterior left frontal stroke. He was found to have a LV apical thrombus by TTE, and was placed on Coumadin therapy. The patient returned to [facility] had repeat 2D echo ordered by PCP and no mural thrombus seen. However, he suffered a recurring left cerebellar stroke while on Coumadin on 01/18/2014 with dysarthria and ataxia. His INR was low at that time at 1.47, he was giving lovenox bridging and continued on coumadin.   Interval History During the interval time, the patient has been doing better. The patient is now back home with home health PT/OT/speech. He remains on Coumadin, last check INR was >3 about 2 weeks ago. He still reports some difficulty with word finding and shuffling more with his walking. He has not had any falls. He denies problems with swallowing. He continues to have hypophonia with his Parkinson's disease. He followed with Dr. Jannifer Franklin on 01/24/14 and increased sinemet dose for increasing problem with freezing during walking and with turns.   Today in clinic, he complained that his right foot becomes discolorated for about 2 months. His right leg was swollen before but now subsided, however the color in the right foot is always purple and different from the left. He denies any foot pain, pale, or numbness tingling. However the right toes are cold all the time.  REVIEW OF SYSTEMS: Full 14 system review of  systems performed and notable only for those listed below and in HPI above, all others are negative:  Constitutional:   Cardiovascular:  Ear/Nose/Throat:   Skin:  Eyes:   Respiratory:   Gastroitestinal:   Genitourinary:  Hematology/Lymphatic:   Endocrine:  Musculoskeletal:   Allergy/Immunology:   Neurological:  Walking difficulty Psychiatric:  Sleep:   The following represents the patient's updated allergies and side effects list: No Known Allergies  The neurologically relevant items on the patient's problem list were reviewed on today's visit.  Neurologic Examination  A problem focused neurological exam (12 or more points of the single system neurologic examination, vital signs counts as 1 point, cranial nerves count for 8 points) was performed.  Blood pressure 134/79, pulse 104, height 5\' 6"  (1.676 m), weight 150 lb (68.04 kg).  General - Well nourished, well developed, in no apparent distress.  Ophthalmologic - Sharp disc margins OU.  Cardiovascular - Regular rate and rhythm with no murmur.  Extremities - right foot per recording color, front half cold temperature, no tenderness, however decreased capillary refill time.   Mental Status -  Level of arousal and orientation to time, place, and person were intact. Mild word finding difficulties, however intact naming, repetition, and comprehension. Mild dysarthria.  Cranial Nerves II - XII - II - Visual field intact OU. III, IV, VI - Extraocular movements intact. V - Facial sensation intact bilaterally. VII - Facial movement intact bilaterally.  VIII - Hearing & vestibular intact bilaterally. X - Palate elevates symmetrically, mild dysarthria, soft  voice. XI - Chin turning & shoulder shrug intact bilaterally. XII - Tongue protrusion intact.  Motor Strength - The patient's strength was normal in all extremities and pronator drift was absent.  Bulk was normal and fasciculations were absent.   Motor Tone - Muscle tone  was assessed at the neck and appendages and was normal. No rigidity.  Reflexes - The patient's reflexes were 1+ in all extremities and he had no pathological reflexes.  Sensory - Light touch, temperature/pinprick were assessed and were normal.    Coordination - The patient had normal movements in the hands with no ataxia or dysmetria. No resting tremor.  Gait and Station - able to rise from a seated position with arms crossed, with some tendency to lean backwards. He can walk without assistive device, has good stride, but he has difficulty with initiation of gait. Tandem gait was not attempted.  Data reviewed: I personally reviewed the images and agree with the radiology interpretations.  Dg Chest 2 View 01/18/2014  No active cardiopulmonary disease.   Ct Head Wo Contrast 01/18/2014  Acute to subacute left posterior frontal/anterior parietal infarct.   Mr Brain Wo Contrast 01/18/2014  1. Acute/subacute infarct in the posterior left frontal lobe is confirmed. There is evidence of cortical laminar necrosis without definite hemorrhage. 2. Acute/subacute infarct within the posterolateral left cerebellum. 3. More acute linear area of infarction within the anteromedial superior left cerebellum. 4. Other scattered white matter disease is present bilaterally and within the brainstem. 5. Mild sinus disease. 6. The report of a brain MRI 12/27/2013 in [place] does not document any infarcts in the cerebellum, suggesting these are new.   2D echo 01/06/14 - Left ventricle: The cavity size was normal. Systolic function was normal. The estimated ejection fraction was in the range of 55% to 60%. Wall motion was normal; there were no regional wall motion abnormalities. - Mitral valve: There was trivial regurgitation. - Tricuspid valve: There was trivial regurgitation. - Pulmonary arteries: PA peak pressure: 37 mm Hg (S).  Impressions: - The right ventricular systolic  pressure was increased consistent with mild pulmonary hypertension.  Component     Latest Ref Rng 01/18/2014 01/19/2014  Cholesterol     0 - 200 mg/dL  157  Triglycerides     <150 mg/dL  41  HDL     >39 mg/dL  57  Total CHOL/HDL Ratio       2.8  VLDL     0 - 40 mg/dL  8  LDL (calc)     0 - 99 mg/dL  92  Hemoglobin A1C     <5.7 % 6.0 (H)   Mean Plasma Glucose     <117 mg/dL 126 (H)   TSH     0.350 - 4.500 uIU/mL 1.530     Assessment: As you may recall, he is a 81 y.o. Caucasian male with PMH of HTN, HLD and PD was admitted in [place] on 12/29/13 for left frontal stroke and found to have left LV thrombus put on coumadin. However, developed acute left cerebellar stroke on 01/18/14 due to low INR level. His repeat 2D echo on 01/06/14 did not see LV thrombus. He was bridged with lovenox and currently on coumadin with last check INR > 3. He still has mild word finding difficultes and more shuffling gait. Has been followed with Dr. Jannifer Franklin and increased sinemet dose. Today, he complained of left foot discoloration for about 2 months, no pain, no pale but purple color and  decreased capillary refill time. Will do LE arterial and venous doppler. Low suspicious for clot as he is on coumadin and no pain or pale.   Plan:  - continue coumadin for stroke prevention. INR goal 2-3. Need to find out the during of anticoagulation - check BP at home - continue sinemet and selegiline - Follow up with your primary care physician for stroke risk factor modification. Recommend maintain blood pressure goal <130/80, diabetes with hemoglobin A1c goal below 6.5% and lipids with LDL cholesterol goal below 70 mg/dL.  - continue to follow up with Dr. Jannifer Franklin for PD - right foot arterial and venous doppler - RTC in 3 months.  Orders Placed This Encounter  Procedures  . Lower Extremity Arterial Duplex Right    R foot discoloration and cold    Standing Status: Future     Number of Occurrences:      Standing  Expiration Date: 03/16/2015    Order Specific Question:  Laterality    Answer:  Right    Order Specific Question:  Where should this test be performed:    Answer:  [facility]  . Lower Extremity Venous Duplex Right    Right foot discoloration and cold    Standing Status: Future     Number of Occurrences:      Standing Expiration Date: 03/16/2015    Order Specific Question:  Laterality    Answer:  Right    Order Specific Question:  Where should this test be performed:    Answer:  [facility]    No orders of the defined types were placed in this encounter.    Patient Instructions  - continue coumadin for stroke prevention - check BP at home - continue PD meds as directed by Dr. Jannifer Franklin - Follow up with your primary care physician for stroke risk factor modification. Recommend maintain blood pressure goal <130/80, diabetes with hemoglobin A1c goal below 6.5% and lipids with LDL cholesterol goal below 70 mg/dL.  - due to right foot discoloration, will do RLE artery and venous doppler. - follow up in 3 months.    Rosalin Hawking, MD PhD [facility] [address] [place], [place] [address] [phone]

## 2014-03-15 NOTE — Patient Instructions (Signed)
-   continue coumadin for stroke prevention - check BP at home - continue PD meds as directed by Dr. Jannifer Franklin - Follow up with your primary care physician for stroke risk factor modification. Recommend maintain blood pressure goal <130/80, diabetes with hemoglobin A1c goal below 6.5% and lipids with LDL cholesterol goal below 70 mg/dL.  - due to right foot discoloration, will do RLE artery and venous doppler. - follow up in 3 months.

## 2014-03-16 ENCOUNTER — Ambulatory Visit: Payer: Medicare Other

## 2014-03-16 ENCOUNTER — Ambulatory Visit: Payer: Medicare Other | Attending: Neurology | Admitting: Physical Therapy

## 2014-03-16 DIAGNOSIS — E785 Hyperlipidemia, unspecified: Secondary | ICD-10-CM | POA: Insufficient documentation

## 2014-03-16 DIAGNOSIS — R279 Unspecified lack of coordination: Secondary | ICD-10-CM | POA: Diagnosis not present

## 2014-03-16 DIAGNOSIS — R4701 Aphasia: Secondary | ICD-10-CM

## 2014-03-16 DIAGNOSIS — K219 Gastro-esophageal reflux disease without esophagitis: Secondary | ICD-10-CM | POA: Insufficient documentation

## 2014-03-16 DIAGNOSIS — I10 Essential (primary) hypertension: Secondary | ICD-10-CM | POA: Diagnosis not present

## 2014-03-16 DIAGNOSIS — R498 Other voice and resonance disorders: Secondary | ICD-10-CM

## 2014-03-16 DIAGNOSIS — I69928 Other speech and language deficits following unspecified cerebrovascular disease: Secondary | ICD-10-CM | POA: Diagnosis not present

## 2014-03-16 DIAGNOSIS — Z9181 History of falling: Secondary | ICD-10-CM | POA: Insufficient documentation

## 2014-03-16 DIAGNOSIS — E039 Hypothyroidism, unspecified: Secondary | ICD-10-CM | POA: Insufficient documentation

## 2014-03-16 DIAGNOSIS — G20 Parkinson's disease: Secondary | ICD-10-CM | POA: Diagnosis not present

## 2014-03-16 DIAGNOSIS — R269 Unspecified abnormalities of gait and mobility: Secondary | ICD-10-CM

## 2014-03-16 NOTE — Patient Instructions (Signed)
Read 10 sentences 2-3 times a day and focus on slower speech by opening mouth and giving every sound its due. We will work on aphasia (language deficit from the stroke) first, then work on loudness if we need to after that.

## 2014-03-16 NOTE — Therapy (Signed)
[facility] [address] [place], [place], [address] Phone: [phone]   Fax:  [phone]  Speech Language Pathology Evaluation  Patient Details  Name: Kyle Cameron MRN: [id number] Date of Birth: 1926-09-23 Referring Provider:  Kandice Hams, MD  Encounter Date: 03/16/2014      End of Session - 03/16/14 1106    Visit Number 1   Number of Visits 16   Date for SLP Re-Evaluation 05/14/14   SLP Start Time 30   SLP Stop Time  1050   SLP Time Calculation (min) 31 min   Activity Tolerance Patient tolerated treatment well      Past Medical History  Diagnosis Date  . Hypertension   . High cholesterol   . Prostate atrophy   . GERD (gastroesophageal reflux disease)   . History of diverticulitis of colon   . Benign enlargement of prostate   . Hypothyroidism   . History of renal calculi   . Parkinson's disease   . Renal calculi   . CVA (cerebral infarction)     Past Surgical History  Procedure Laterality Date  . Cholecystectomy  02/04/2012    Procedure: LAPAROSCOPIC CHOLECYSTECTOMY WITH INTRAOPERATIVE CHOLANGIOGRAM;  Surgeon: Merrie Roof, MD;  Location: [facility];  Service: General;  Laterality: N/A;  . Lithotripsy      renal calculi  . Tonsillectomy    . Cataract extraction, bilateral      There were no vitals taken for this visit.  Visit Diagnosis: Expressive aphasia  Hypophonia      Subjective Assessment - 03/16/14 0923    Symptoms Pt with hypophonia due to Parkinson's Disease, seen by this SLP in 2015. SLP would have like to have seen pt for longer therapy course to ensure consistency in carryover. Pt with 2 new CVAs now wiith anomia.          SLP Evaluation [facility] - 03/16/14 0925    SLP Visit Information   Onset Date December 2015   Medical Diagnosis CVA and Parkinson's Disease   Subjective   Subjective Phonemic paraphasias, mild anomia, as well as abandonments noted in sentences and  conversation.    Pain Assessment   Currently in Pain? No/denies   General Information   HPI Pt seen by this SLP in 2015 for hypophonic voice due to Parkinson's. Pt desired d/c from therapy course earlier than SLP desired due to unable to ensure consistency with carryover of louder voice. Pt had first CVA in [place] in early December, then another CVA later that month at home.   Prior Functional Status   Cognitive/Linguistic Baseline Baseline deficits   Baseline deficit details awareness of deficits    Lives With Significant other   Vocation Retired   Associate Professor   Overall Cognitive Status History of cognitive impairments - at baseline  pt reports he is at baseline with thinking skills   Auditory Comprehension   Overall Auditory Comprehension Appears within functional limits for tasks assessed   Verbal Expression   Overall Verbal Expression Impaired   Level of Generative/Spontaneous Verbalization Conversation   Repetition Impaired   Level of Impairment Phrase level;Word level;Sentence level  pt read sentences with paraphasias and hesitation noted   Interfering Components Premorbid deficit  Parkinson's Disease   Effective Techniques Other (Comment)  rate reduction   Other Verbal Expression Comments Phonemic paraphasias in confrontation naming, usually. When pt reduced rate in reading and in conversation tasks after eval tasks for approx 10 minutes, his speech was  more fluid. Longer utterances resulted in more frequent episodes of hesitation, slurred/imprecise/dysfluent speech and abandonment.   Oral Motor/Sensory Function   Overall Oral Motor/Sensory Function Impaired at baseline   Labial ROM Reduced right  mild   Labial Strength Reduced Right  mild   Labial Coordination Reduced   Lingual ROM Reduced right  mild   Lingual Strength Reduced Right   Lingual Coordination Reduced   Velum Within Functional Limits   Motor Speech   Articulation Impaired   Intelligibility Intelligibility  reduced   Sentence 75-100% accurate   Conversation 75-100% accurate   Standardized Assessments   Standardized Assessments  [place] Naming Test-2nd edition             SLP Education - 04-12-14 1105    Education provided Yes   Education Details Definition of aphasia, results of aphasia in pt's speech   Person(s) Educated Patient   Methods Explanation   Comprehension Verbalized understanding          SLP Short Term Goals - April 12, 2014 1109    SLP SHORT TERM GOAL #1   Title pt will repeat sentences of 10 words 85% success with modified independence   Time 4   Period Weeks   Status New   SLP SHORT TERM GOAL #2   Title pt will use strategies for verbal expresion in simple conversation with occasional min-mod A   Time 4   Period Weeks   Status New   SLP SHORT TERM GOAL #3   Title pt will gnerate sentence responses 85% success with modified independence   Time 4   Period Weeks   Status New          SLP Long Term Goals - 04/12/14 1112    SLP LONG TERM GOAL #1   Title pt will increase loudness to average 70dB in 5 minutes simple conversation   Time 8   Period Weeks   Status New   SLP LONG TERM GOAL #2   Title pt will demo compensations for verbal expression in 10 minutes simple conversation with occasional min A   Time 8   Period Weeks   Status New   SLP LONG TERM GOAL #3   Title pt will maintain loud /a/ for average 80dB over 4 sessions   Time 8   Period Weeks   Status New          Plan - 2014-04-12 1107    Clinical Impression Statement Pt presents with expressive language deficits and reduced loudness since discharge from [facility] last year. I recommend skilled ST to improve pt's ability to communicate by increasing fluent and precise speech and speech volume.   Speech Therapy Frequency 2x / week   Duration --  8 weeks   Treatment/Interventions Language facilitation;Functional tasks;Cueing hierarchy;Patient/family education;SLP instruction and feedback;Compensatory  techniques;Internal/external aids   Potential to Achieve Goals Good   Potential Considerations Co-morbidities   Consulted and Agree with Plan of Care Patient          G-Codes - 04-12-14 1136    Functional Assessment Tool Used noms   Functional Limitations Spoken language expressive   Spoken Language Expression Current Status [phone]) At least 20 percent but less than 40 percent impaired, limited or restricted   Spoken Language Expression Goal Status ([id number]) At least 1 percent but less than 20 percent impaired, limited or restricted      Problem List Patient Active Problem List   Diagnosis Date Noted  . Discoloration of skin of foot  03/15/2014  . PD (Parkinson's disease) 03/15/2014  . 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 Essential hypertension 03/15/2014  . HLD (hyperlipidemia) 03/15/2014  . Cough   . LV (left ventricular) mural thrombus   . CVA (cerebral infarction) 01/18/2014  . Imbalance 01/18/2014  . Slurring of speech   . Abnormality of gait 03/12/2012  . Paralysis agitans 03/12/2012  . Acute cholecystitis 02/04/2012  . Hypertension 02/04/2012  . Hyperlipidemia 02/04/2012    [facility], SLP 03/16/2014, 11:37 AM  [facility] [address] [place], [place], [address] Phone: [phone]   Fax:  [phone]

## 2014-03-16 NOTE — Therapy (Signed)
[facility] [address] [place], [place], [address] Phone: [phone]   Fax:  [phone]  Physical Therapy Evaluation  Patient Details  Name: Kyle Cameron MRN: [id number] Date of Birth: Sep 01, 1926 Referring Provider:  Kandice Hams, MD  Encounter Date: 03/16/2014      PT End of Session - 03/16/14 1806    Visit Number 1  Eval 03/16/14   Number of Visits 17   Date for PT Re-Evaluation 05/15/14   Authorization Type Medicare G-code every 10th visit   PT Start Time 0935   PT Stop Time 1015   PT Time Calculation (min) 40 min   Activity Tolerance Patient tolerated treatment well   Behavior During Therapy [facility] for tasks assessed/performed      Past Medical History  Diagnosis Date  . Hypertension   . High cholesterol   . Prostate atrophy   . GERD (gastroesophageal reflux disease)   . History of diverticulitis of colon   . Benign enlargement of prostate   . Hypothyroidism   . History of renal calculi   . Parkinson's disease   . Renal calculi   . CVA (cerebral infarction)     Past Surgical History  Procedure Laterality Date  . Cholecystectomy  02/04/2012    Procedure: LAPAROSCOPIC CHOLECYSTECTOMY WITH INTRAOPERATIVE CHOLANGIOGRAM;  Surgeon: Merrie Roof, MD;  Location: [facility];  Service: General;  Laterality: N/A;  . Lithotripsy      renal calculi  . Tonsillectomy    . Cataract extraction, bilateral      There were no vitals taken for this visit.  Visit Diagnosis:  Abnormality of gait  Lack of coordination      Subjective Assessment - 03/16/14 0943    Symptoms Pt is an 81 year old male who presents to OP PT status post 2 CVAs in December 2015, when he was on a trip in [place].  Pt had noticed some R sided weakness and speech difficulties since the stroke, but he feels that the RLE weakness has resolved.  He continues to note speech difficulties.  Pt reports his walking and balance is still affected  by his Parkinson's disease, which he experiences freezing episodes upon intitiating standing and walking.  He reports several falls in the past 6 months.   Pertinent History CVA 12/26/13 and 01/18/14, Parkinson's disease   Patient Stated Goals Pt's goal for therapy is to help with the initiation of walking.   Currently in Pain? No/denies          [facility] PT Assessment - 03/16/14 0948    Assessment   Medical Diagnosis CVA/Parkinson's   Onset Date 12/26/13  CVA, 01/18/14 CVA   Precautions   Precautions Fall  No driving; pt experiences freezing episodes   Balance Screen   Has the patient fallen in the past 6 months Yes   How many times? 3  fall to the right   Has the patient had a decrease in activity level because of a fear of falling?  Yes   Is the patient reluctant to leave their home because of a fear of falling?  Yes   [place] Private residence   Type of [place] Access Stairs to enter   Entrance Stairs-Number of Steps 3   Entrance Stairs-Rails None   Home Layout Two level;Able to live on main level with bedroom/bathroom   [place] - 2 wheels;Cane - single point   Prior  Function   Level of Independence Independent with basic ADLs;Independent with gait;Independent with transfers   ROM / Strength   AROM / PROM / Strength Strength   Strength   Overall Strength Comments Grossly tested 4/5 throughout   Strength Assessment Site Hip;Knee;Ankle   Transfers   Transfers Sit to Stand;Stand to Sit   Sit to Stand Without upper extremity assist;From chair/3-in-1;Five times sit to stand;5: Supervision  5x sit<>stand 15.75 sec with posterior lean   Stand to Sit 5: Supervision;Without upper extremity assist;To chair/3-in-1   Ambulation/Gait   Ambulation/Gait Yes   Ambulation/Gait Assistance 5: Supervision   Ambulation/Gait Assistance Details Pt has significant festinating/freezing episodes with initiating gait and with turns.    Ambulation Distance (Feet) 150 Feet   Assistive device Straight cane   Gait Pattern Decreased step length - right;Decreased step length - left;Shuffle;Festinating;Poor foot clearance - left;Poor foot clearance - right;Narrow base of support  decreased arm swing; has difficulty sequencing cane   Ambulation Surface Level;Indoor   Gait velocity 10.91 sec   Gait Comments Gait training with cane, with hand over hand and verbal cueing for sequencing of cane and R foot with gait.  Pt tends to use cane with every step, R and L.  Turning activity with wide turns using cane with min guard and verbal cues.   Standardized Balance Assessment   Standardized Balance Assessment Timed Up and Go Test   Timed Up and Go Test   TUG Normal TUG   Normal TUG (seconds) 21.42  cane; freezing episode with turning and with turn to sit   Cognitive TUG (seconds) 16.9  no cane, loss of balance upon initial standing   Functional Gait  Assessment   Gait assessed  Yes  with cane   Gait Level Surface Walks 20 ft, slow speed, abnormal gait pattern, evidence for imbalance or deviates 10-15 in outside of the 12 in walkway width. Requires more than 7 sec to ambulate 20 ft.  8.62   Change in Gait Speed Makes only minor adjustments to walking speed, or accomplishes a change in speed with significant gait deviations, deviates 10-15 in outside the 12 in walkway width, or changes speed but loses balance but is able to recover and continue walking.   Gait with Horizontal Head Turns Performs head turns smoothly with slight change in gait velocity (eg, minor disruption to smooth gait path), deviates 6-10 in outside 12 in walkway width, or uses an assistive device.   Gait with Vertical Head Turns Performs task with slight change in gait velocity (eg, minor disruption to smooth gait path), deviates 6 - 10 in outside 12 in walkway width or uses assistive device   Gait and Pivot Turn Turns slowly, requires verbal cueing, or requires several  small steps to catch balance following turn and stop  8.12 sec; freezing with turns   Step Over Obstacle Is able to step over one shoe box (4.5 in total height) but must slow down and adjust steps to clear box safely. May require verbal cueing.   Gait with Narrow Base of Support Ambulates less than 4 steps heel to toe or cannot perform without assistance.   Gait with Eyes Closed Cannot walk 20 ft without assistance, severe gait deviations or imbalance, deviates greater than 15 in outside 12 in walkway width or will not attempt task.   Ambulating Backwards Cannot walk 20 ft without assistance, severe gait deviations or imbalance, deviates greater than 15 in outside 12 in walkway width or will not  attempt task.   Steps Alternating feet, must use rail.   Total Score 10                          PT Education - 03/16/14 1806    Education provided Yes   Education Details Initial gait training on proper use/sequencing of cane   Person(s) Educated Patient   Methods Explanation;Demonstration;Verbal cues   Comprehension Verbalized understanding;Need further instruction          PT Short Term Goals - 03/16/14 1813    PT SHORT TERM GOAL #1   Title Pt will be independent with HEP for improved transfers, balance, gait.   Time 4   Period Weeks   Status New   PT SHORT TERM GOAL #2   Title Pt will improve 5x sit<>stand to less than or equal to 14.5 seconds with no posterior lean for improved transfer efficiency and safety.   Time 4   Period Weeks   Status New   PT SHORT TERM GOAL #3   Title Pt will improve Functional Gait Assessment score to at least 15/30 for decreased fall risk.   Time 4   Period Weeks   Status New   PT SHORT TERM GOAL #4   Title Pt will improve Timed UP and Go score to less than or equal to 16 seconds for decreased fall risk.   Time 4   Period Weeks   Status New   PT SHORT TERM GOAL #5   Title Pt will verbalize/demonstrate tips to reduce freezing  episodes with gait.   Time 4   Period Weeks   Status New           PT Long Term Goals - 03/16/14 1816    PT LONG TERM GOAL #1   Title Pt will verbalize/demonstrate understanding of fall prevention techniques within home environment. (Target 05/15/14)   Time 8   Period Weeks   Status New   PT LONG TERM GOAL #2   Title perform at least 8 of 10 reps of sit<>stand transfers from <18 inch surfaces, with no posterior lean, for improved efficiency and safety with transfers.   Time 8   Period Weeks   Status New   PT LONG TERM GOAL #3   Title Pt will improve Functional Gait Assessment to at least 20/30 for decreased fall risk.   Time 8   Status New   PT LONG TERM GOAL #4   Title Pt will improve TUG score to less than or equal to 13.5 seconds for decreased fall risk.   Time 8   Period Weeks   Status New   PT LONG TERM GOAL #5   Title Pt will verbalize plans for continued community fitness upon D/C from PT.   Time 8   Period Weeks   Status New               Plan - 03/16/14 1808    Clinical Impression Statement Pt is an 81 year old male who presents to OP PT with history of Parkinson's disease, with 2 CVAs in December 2015.  Pt noticed speech issues and R sided weakness.  Pt noticing increased freezing and festinating episodes with gait.  Pt's gait/balance scores have declined since D/C from PT in November 2015.  Functional Gait Assessment score today is 10/30 (was 20/30 at discharge); pt is at fall risk per FGA and Timed Up and Go scores.  Pt having difficulty  with transfers with strong posterior lean noted upon attempts to stand without use of UEs.  Pt would benefit from skilled PT to address transfers, balance, strength, and gait for overall improved functional mobility and decreased fall risk.   Pt will benefit from skilled therapeutic intervention in order to improve on the following deficits Abnormal gait;Decreased balance;Decreased safety awareness;Decreased  coordination;Decreased mobility;Decreased strength;Decreased knowledge of use of DME   Rehab Potential Good   PT Frequency 2x / week  1 wk 1 wk, then 2x/wk 7 weeks-8 wk total   PT Duration 8 weeks  plus evaluation   PT Treatment/Interventions ADLs/Self Care Home Management;DME Instruction;Stair training;Gait training;Functional mobility training;Therapeutic activities;Neuromuscular re-education;Balance training;Therapeutic exercise;Patient/family education   PT Next Visit Plan Gait training with cane for improved sequence, transfer training, tips to reduce freezing   PT Home Exercise Plan PWR! Moves in standing, weightshifting and turns   Consulted and Agree with Plan of Care Patient          G-Codes - 2014/04/03 1819    Functional Assessment Tool Used Functional Gait Assessment 10/30, TUG 21.42 sec, TUG cog 16.19 sec, 5x sit<>stand 15.75 seconds posterior lean   Functional Limitation Mobility: Walking and moving around   Mobility: Walking and Moving Around Current Status [phone]) At least 40 percent but less than 60 percent impaired, limited or restricted   Mobility: Walking and Moving Around Goal Status [phone]) At least 20 percent but less than 40 percent impaired, limited or restricted       Problem List Patient Active Problem List   Diagnosis Date Noted  . Discoloration of skin of foot 03/15/2014  . PD (Parkinson's disease) 03/15/2014  . 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 Essential hypertension 03/15/2014  . HLD (hyperlipidemia) 03/15/2014  . Cough   . LV (left ventricular) mural thrombus   . CVA (cerebral infarction) 01/18/2014  . Imbalance 01/18/2014  . Slurring of speech   . Abnormality of gait 03/12/2012  . Paralysis agitans 03/12/2012  . Acute cholecystitis 02/04/2012  . Hypertension 02/04/2012  . Hyperlipidemia 02/04/2012    Trask Vosler W. 04-03-2014, 6:21 PM  Mady Haagensen, PT 2014/04/03 6:21 PM Phone: [phone] Fax:  [facility] [address] [place], [place], [address] Phone: [phone]   Fax:  [phone]

## 2014-03-17 ENCOUNTER — Ambulatory Visit: Payer: Medicare Other | Admitting: Physical Therapy

## 2014-03-17 DIAGNOSIS — R269 Unspecified abnormalities of gait and mobility: Secondary | ICD-10-CM

## 2014-03-17 DIAGNOSIS — R29898 Other symptoms and signs involving the musculoskeletal system: Secondary | ICD-10-CM

## 2014-03-17 DIAGNOSIS — G20 Parkinson's disease: Secondary | ICD-10-CM | POA: Diagnosis not present

## 2014-03-17 NOTE — Patient Instructions (Signed)
Sit to Stand Transfers:  1. Scoot out to the edge of the chair 2. Place your feet flat on the floor, shoulder width apart.  Make sure your feet are tucked just under your knees. 3. Lean forward (nose over toes) with momentum, and stand up tall with your best posture.  If you need to use your arms, use them as a quick boost up to stand. 4. If you are in a low or soft chair, you can lean back and then forward up to stand, in order to get more momentum. 5. Once you are standing, make sure you are looking ahead and standing tall.  To sit down:  1. Back up until you feel the chair behind your legs. 2. Bend at you hips, reaching  Back for you chair, if needed, then slowly squat to sit down on your chair.   FOR WALKING WITH CANE:  MAKE SURE TO USE THE CANE AND THE RIGHT FOOT AT THE SAME TIME.  USE DELIBERATE, LARGE STEPS ON THE RIGHT NAD THE CANE SHOULD DELIBERATELY COME OUT TO BE THE TARGET FOR THE STEP LENGTH OF YOUR RIGHT FOOT.  TRY TO WALK AROUND YOUR HOUSE AT LEAST 3 TIMES PER DAY FOR GOOD WALKING PRACTICE.

## 2014-03-17 NOTE — Therapy (Signed)
[name] [address] [place], [place], [address] Phone: [phone]   Fax:  [phone]  Physical Therapy Treatment  Patient Details  Name: Kyle Cameron MRN: [id number] Date of Birth: 10/08/1926 Referring Provider:  Kandice Hams, MD  Encounter Date: 03/17/2014      PT End of Session - 03/17/14 1230    Visit Number 2   Number of Visits 17   Date for PT Re-Evaluation 05/15/14   Authorization Type Medicare G-code every 10th visit   PT Start Time 0803   PT Stop Time 0847   PT Time Calculation (min) 44 min   Equipment Utilized During Treatment Gait belt   Activity Tolerance Patient tolerated treatment well      Past Medical History  Diagnosis Date  . Hypertension   . High cholesterol   . Prostate atrophy   . GERD (gastroesophageal reflux disease)   . History of diverticulitis of colon   . Benign enlargement of prostate   . Hypothyroidism   . History of renal calculi   . Parkinson's disease   . Renal calculi   . CVA (cerebral infarction)     Past Surgical History  Procedure Laterality Date  . Cholecystectomy  02/04/2012    Procedure: LAPAROSCOPIC CHOLECYSTECTOMY WITH INTRAOPERATIVE CHOLANGIOGRAM;  Surgeon: Merrie Roof, MD;  Location: [facility];  Service: General;  Laterality: N/A;  . Lithotripsy      renal calculi  . Tonsillectomy    . Cataract extraction, bilateral      There were no vitals taken for this visit.  Visit Diagnosis:  Abnormality of gait  Rigidity      Subjective Assessment - 03/17/14 0802    Symptoms Pt reports losing his cane-"its somewhere at home."     Currently in Pain? No/denies                    [facility] Adult PT Treatment/Exercise - 03/17/14 0807    Transfers   Transfers Sit to Stand;Stand to Sit   Sit to Stand With upper extremity assist;Without upper extremity assist;From chair/3-in-1;From bed;5: Supervision  x 10 from mat, x 10 from chair with cues  for forward lean   Sit to Stand Details (indicate cue type and reason) from 16 inch chair no arm rests, x 6 reps with cues for momentum   Ambulation/Gait   Ambulation/Gait Yes   Ambulation/Gait Assistance 4: Min guard   Ambulation Distance (Feet) 220 Feet  then 400 ft ( 2 episodes of stopping to reset sequence)   Assistive device Straight cane   Gait Pattern Decreased step length - right;Decreased step length - left;Decreased dorsiflexion - right;Festinating;Narrow base of support;Poor foot clearance - left;Poor foot clearance - right  Improved gait sequence with verbal cues and slowed pace   Ambulation Surface Level;Indoor   Gait Comments 2nd set of gait training x 400 ft. with cane, with one episode of stopping to reset for proper gait sequence  Cues given for turning to sit to reduce freezing   High Level Balance   High Level Balance Activities Weight-shifting turns;Marching turns  to complete gait to sit at mat   High Level Balance Comments Standing activities at counter to improve step length and foot clearance:  forward step over obstacle, 2 sets x 10, side step over obstacle x 10 reps, focus on weighshifting and widened BOS                PT Education -  03/17/14 1229    Education provided Yes   Education Details HEP-Gait training with cane; sit<>stand transfer sequence   Person(s) Educated Patient   Methods Explanation;Demonstration;Verbal cues   Comprehension Verbalized understanding;Returned demonstration;Need further instruction          PT Short Term Goals - 03/16/14 1813    PT SHORT TERM GOAL #1   Title Pt will be independent with HEP for improved transfers, balance, gait.   Time 4   Period Weeks   Status New   PT SHORT TERM GOAL #2   Title Pt will improve 5x sit<>stand to less than or equal to 14.5 seconds with no posterior lean for improved transfer efficiency and safety.   Time 4   Period Weeks   Status New   PT SHORT TERM GOAL #3   Title Pt will  improve Functional Gait Assessment score to at least 15/30 for decreased fall risk.   Time 4   Period Weeks   Status New   PT SHORT TERM GOAL #4   Title Pt will improve Timed UP and Go score to less than or equal to 16 seconds for decreased fall risk.   Time 4   Period Weeks   Status New   PT SHORT TERM GOAL #5   Title Pt will verbalize/demonstrate tips to reduce freezing episodes with gait.   Time 4   Period Weeks   Status New           PT Long Term Goals - 03/16/14 1816    PT LONG TERM GOAL #1   Title Pt will verbalize/demonstrate understanding of fall prevention techniques within home environment. (Target 05/15/14)   Time 8   Period Weeks   Status New   PT LONG TERM GOAL #2   Title perform at least 8 of 10 reps of sit<>stand transfers from <18 inch surfaces, with no posterior lean, for improved efficiency and safety with transfers.   Time 8   Period Weeks   Status New   PT LONG TERM GOAL #3   Title Pt will improve Functional Gait Assessment to at least 20/30 for decreased fall risk.   Time 8   Status New   PT LONG TERM GOAL #4   Title Pt will improve TUG score to less than or equal to 13.5 seconds for decreased fall risk.   Time 8   Period Weeks   Status New   PT LONG TERM GOAL #5   Title Pt will verbalize plans for continued community fitness upon D/C from PT.   Time 8   Period Weeks   Status New               Plan - 03/17/14 1231    Clinical Impression Statement Worked on transfers with forward momentum and cues; worked on slowed pace and sequencing of gait with cane; pt improving with gait sequence with concentrated effort; when environmental distractions or conversations occur, pt has hastening/quickened gait pattern with difficulty maintaining proper cane sequence.   Pt will benefit from skilled therapeutic intervention in order to improve on the following deficits Abnormal gait;Decreased balance;Decreased safety awareness;Decreased  coordination;Decreased mobility;Decreased strength;Decreased knowledge of use of DME   Rehab Potential Good   PT Frequency 2x / week  1x/wk 1 wk, then 2x/wk 7 weeks-8 weeks total   PT Duration 8 weeks  wk 1 of 8   PT Treatment/Interventions ADLs/Self Care Home Management;DME Instruction;Stair training;Gait training;Functional mobility training;Therapeutic activities;Neuromuscular re-education;Balance training;Therapeutic exercise;Patient/family education  PT Next Visit Plan Review gait and transfer training, practice tips to reduce freezing; weightshifting/ PWR! moves in standing   Consulted and Agree with Plan of Care Patient          G-Codes - 03/26/2014 1819    Functional Assessment Tool Used Functional Gait Assessment 10/30, TUG 21.42 sec, TUG cog 16.19 sec, 5x sit<>stand 15.75 seconds posterior lean   Functional Limitation Mobility: Walking and moving around   Mobility: Walking and Moving Around Current Status [phone]) At least 40 percent but less than 60 percent impaired, limited or restricted   Mobility: Walking and Moving Around Goal Status [phone]) At least 20 percent but less than 40 percent impaired, limited or restricted      Problem List Patient Active Problem List   Diagnosis Date Noted  . Discoloration of skin of foot 03/15/2014  . PD (Parkinson's disease) 03/15/2014  . 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 Essential hypertension 03/15/2014  . HLD (hyperlipidemia) 03/15/2014  . Cough   . LV (left ventricular) mural thrombus   . CVA (cerebral infarction) 01/18/2014  . Imbalance 01/18/2014  . Slurring of speech   . Abnormality of gait 03/12/2012  . Paralysis agitans 03/12/2012  . Acute cholecystitis 02/04/2012  . Hypertension 02/04/2012  . Hyperlipidemia 02/04/2012    MARRIOTT,AMY W. 03/17/2014, 12:36 PM  Mady Haagensen, PT 03/17/2014 12:38 PM Phone: [phone] Fax: [facility] [address] [place], [place], [address] Phone: [phone]   Fax:  [phone]

## 2014-03-18 ENCOUNTER — Ambulatory Visit: Payer: Medicare Other

## 2014-03-18 DIAGNOSIS — R498 Other voice and resonance disorders: Secondary | ICD-10-CM

## 2014-03-18 DIAGNOSIS — R4701 Aphasia: Secondary | ICD-10-CM

## 2014-03-18 DIAGNOSIS — G20 Parkinson's disease: Secondary | ICD-10-CM | POA: Diagnosis not present

## 2014-03-18 NOTE — Patient Instructions (Signed)
Read the paper, marking off five sentences at a time Read this 5 sentence selection pausing between EVERY word - then re-read it with the same focus. Do this for 15 minutes, twice a day.

## 2014-03-18 NOTE — Therapy (Signed)
[facility] [address] [place], [place], [address] Phone: [phone]   Fax:  [phone]  Speech Language Pathology Treatment  Patient Details  Name: Kyle Cameron MRN: [id number] Date of Birth: 08-Feb-1926 Referring Provider:  Kandice Hams, MD  Encounter Date: 03/18/2014      End of Session - 03/18/14 0930    Visit Number 2   Number of Visits 16   Date for SLP Re-Evaluation 05/14/14   SLP Start Time 0848   SLP Stop Time  0926   SLP Time Calculation (min) 38 min   Activity Tolerance Patient tolerated treatment well      Past Medical History  Diagnosis Date  . Hypertension   . High cholesterol   . Prostate atrophy   . GERD (gastroesophageal reflux disease)   . History of diverticulitis of colon   . Benign enlargement of prostate   . Hypothyroidism   . History of renal calculi   . Parkinson's disease   . Renal calculi   . CVA (cerebral infarction)     Past Surgical History  Procedure Laterality Date  . Cholecystectomy  02/04/2012    Procedure: LAPAROSCOPIC CHOLECYSTECTOMY WITH INTRAOPERATIVE CHOLANGIOGRAM;  Surgeon: Merrie Roof, MD;  Location: [facility];  Service: General;  Laterality: N/A;  . Lithotripsy      renal calculi  . Tonsillectomy    . Cataract extraction, bilateral      There were no vitals taken for this visit.  Visit Diagnosis: Expressive aphasia  Hypophonia      Subjective Assessment - 03/18/14 0851    Symptoms "I went- went- I went through the sent- sentences" (fast rate)             ADULT SLP TREATMENT - 03/18/14 0852    General Information   Behavior/Cognition Alert;Cooperative;Pleasant mood   Treatment Provided   Treatment provided Cognitive-Linquistic   Pain Assessment   Pain Assessment No/denies pain   Cognitive-Linquistic Treatment   Treatment focused on Aphasia   Skilled Treatment Pt read sentences with 30% success in slower rate (overarticulation) and  75% success with pausing between words. Paragraphs of 4-5 sentences req'd min mod cues usually for pausing between words.  Conversation was functional but not optimal, using compensations. Pt with voice <70dB the entire session.    Assessment / Recommendations / Plan   Plan Continue with current plan of care   Progression Toward Goals   Progression toward goals Progressing toward goals          SLP Education - 03/18/14 0929    Education provided Yes   Education Details Compensations for aphasia (pausing between words)   Person(s) Educated Patient   Methods Explanation;Demonstration   Comprehension Verbalized understanding;Returned demonstration;Verbal cues required          SLP Short Term Goals - 03/18/14 0932    SLP SHORT TERM GOAL #1   Title pt will repeat sentences of 10 words 85% success with modified independence   Time 4   Period Weeks   Status On-going   SLP SHORT TERM GOAL #2   Title pt will use strategies for verbal expresion in simple conversation with occasional min-mod A   Time 4   Period Weeks   Status On-going   SLP SHORT TERM GOAL #3   Title pt will gnerate sentence responses 85% success with modified independence   Time 4   Period Weeks   Status On-going  SLP Long Term Goals - 03/18/14 0932    SLP LONG TERM GOAL #1   Title pt will increase loudness to average 70dB in 5 minutes simple conversation   Time 8   Period Weeks   Status On-going   SLP LONG TERM GOAL #2   Title pt will demo compensations for verbal expression in 10 minutes simple conversation with occasional min A   Time 8   Period Weeks   Status On-going   SLP LONG TERM GOAL #3   Title pt will maintain loud /a/ for average 80dB over 4 sessions   Time 8   Period Weeks   Status On-going          Plan - 03/18/14 0930    Clinical Impression Statement Pt is learning how to use compensations for aphasia, but conversation is significantly hindered by both aphasia, and  dysarthria due to the Parkinson's. Skilled ST remains necessary to incr pt's independence with everyday communication.   Speech Therapy Frequency 2x / week   Duration --  8 weeks   Treatment/Interventions Language facilitation;Functional tasks;Cueing hierarchy;Patient/family education;SLP instruction and feedback;Compensatory techniques;Internal/external aids   Potential to Achieve Goals Good   Potential Considerations Co-morbidities        Problem List Patient Active Problem List   Diagnosis Date Noted  . Discoloration of skin of foot 03/15/2014  . PD (Parkinson's disease) 03/15/2014  . 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 Essential hypertension 03/15/2014  . HLD (hyperlipidemia) 03/15/2014  . Cough   . LV (left ventricular) mural thrombus   . CVA (cerebral infarction) 01/18/2014  . Imbalance 01/18/2014  . Slurring of speech   . Abnormality of gait 03/12/2012  . Paralysis agitans 03/12/2012  . Acute cholecystitis 02/04/2012  . Hypertension 02/04/2012  . Hyperlipidemia 02/04/2012    [facility], SLP 03/18/2014, 9:33 AM  [facility] [address] [place], [place], [address] Phone: [phone]   Fax:  [phone]

## 2014-03-21 ENCOUNTER — Ambulatory Visit: Payer: Medicare Other | Admitting: Physical Therapy

## 2014-03-21 ENCOUNTER — Ambulatory Visit: Payer: Medicare Other | Admitting: Occupational Therapy

## 2014-03-21 ENCOUNTER — Ambulatory Visit: Payer: Medicare Other

## 2014-03-21 DIAGNOSIS — R269 Unspecified abnormalities of gait and mobility: Secondary | ICD-10-CM

## 2014-03-21 DIAGNOSIS — M6281 Muscle weakness (generalized): Secondary | ICD-10-CM

## 2014-03-21 DIAGNOSIS — R4701 Aphasia: Secondary | ICD-10-CM

## 2014-03-21 DIAGNOSIS — R209 Unspecified disturbances of skin sensation: Secondary | ICD-10-CM

## 2014-03-21 DIAGNOSIS — G20 Parkinson's disease: Secondary | ICD-10-CM

## 2014-03-21 DIAGNOSIS — Z7409 Other reduced mobility: Secondary | ICD-10-CM

## 2014-03-21 DIAGNOSIS — R6889 Other general symptoms and signs: Secondary | ICD-10-CM

## 2014-03-21 DIAGNOSIS — R279 Unspecified lack of coordination: Secondary | ICD-10-CM

## 2014-03-21 DIAGNOSIS — R29898 Other symptoms and signs involving the musculoskeletal system: Secondary | ICD-10-CM

## 2014-03-21 DIAGNOSIS — R490 Dysphonia: Secondary | ICD-10-CM

## 2014-03-21 NOTE — Patient Instructions (Signed)
  Please complete the assigned speech therapy homework and return it to your next session.  

## 2014-03-21 NOTE — Therapy (Signed)
[facility] [address] [place], [place], [address] Phone: [phone]   Fax:  [phone]  Physical Therapy Treatment  Patient Details  Name: Kyle Cameron MRN: [id number] Date of Birth: 11-May-1926 Referring Provider:  Kandice Hams, MD  Encounter Date: 03/21/2014      PT End of Session - 03/21/14 1729    Visit Number 3   Number of Visits 17   Date for PT Re-Evaluation 05/15/14   Authorization Type Medicare G-code every 10th visit   PT Start Time 1022   PT Stop Time 1101   PT Time Calculation (min) 39 min   Equipment Utilized During Treatment Gait belt   Activity Tolerance Patient tolerated treatment well      Past Medical History  Diagnosis Date  . Hypertension   . High cholesterol   . Prostate atrophy   . GERD (gastroesophageal reflux disease)   . History of diverticulitis of colon   . Benign enlargement of prostate   . Hypothyroidism   . History of renal calculi   . Parkinson's disease   . Renal calculi   . CVA (cerebral infarction)     Past Surgical History  Procedure Laterality Date  . Cholecystectomy  02/04/2012    Procedure: LAPAROSCOPIC CHOLECYSTECTOMY WITH INTRAOPERATIVE CHOLANGIOGRAM;  Surgeon: Merrie Roof, MD;  Location: [facility];  Service: General;  Laterality: N/A;  . Lithotripsy      renal calculi  . Tonsillectomy    . Cataract extraction, bilateral      There were no vitals taken for this visit.  Visit Diagnosis:  Abnormality of gait  Rigidity      Subjective Assessment - 03/21/14 1025    Symptoms No falls over the weekend-almost fell; had a significant freezing episode at a concert   Currently in Pain? No/denies                    [facility] Adult PT Treatment/Exercise - 03/21/14 1038    Transfers   Transfers Sit to Stand;Stand to Sit   Sit to Stand Without upper extremity assist;From bed;From chair/3-in-1  then from 16 in. chair; 10 reps, 3 sets    Sit to  Stand Details (indicate cue type and reason) cues for forward lean   Stand to Sit 5: Supervision   Ambulation/Gait   Ambulation/Gait Yes   Ambulation/Gait Assistance 4: Min guard   Ambulation Distance (Feet) 600 Feet   Assistive device Straight cane   Gait Pattern Decreased step length - right;Decreased step length - left;Decreased dorsiflexion - right;Festinating;Narrow base of support;Poor foot clearance - left;Poor foot clearance - right  Uses cane inconsistently   Ambulation Surface Level;Indoor   High Level Balance   High Level Balance Activities Side stepping;Backward walking;Marching turns  side step turns at counter, cues for foot clearance, pacing   High Level Balance Comments Lateral weightshifting 3 sets x 10 reps; four square step activity with min guard assistance and cues for widened BOS, multiple reps for improved direction changes and quick stops                  PT Short Term Goals - 03/16/14 1813    PT SHORT TERM GOAL #1   Title Pt will be independent with HEP for improved transfers, balance, gait.   Time 4   Period Weeks   Status New   PT SHORT TERM GOAL #2   Title Pt will improve 5x sit<>stand to less than or  equal to 14.5 seconds with no posterior lean for improved transfer efficiency and safety.   Time 4   Period Weeks   Status New   PT SHORT TERM GOAL #3   Title Pt will improve Functional Gait Assessment score to at least 15/30 for decreased fall risk.   Time 4   Period Weeks   Status New   PT SHORT TERM GOAL #4   Title Pt will improve Timed UP and Go score to less than or equal to 16 seconds for decreased fall risk.   Time 4   Period Weeks   Status New   PT SHORT TERM GOAL #5   Title Pt will verbalize/demonstrate tips to reduce freezing episodes with gait.   Time 4   Period Weeks   Status New           PT Long Term Goals - 03/16/14 1816    PT LONG TERM GOAL #1   Title Pt will verbalize/demonstrate understanding of fall prevention  techniques within home environment. (Target 05/15/14)   Time 8   Period Weeks   Status New   PT LONG TERM GOAL #2   Title perform at least 8 of 10 reps of sit<>stand transfers from <18 inch surfaces, with no posterior lean, for improved efficiency and safety with transfers.   Time 8   Period Weeks   Status New   PT LONG TERM GOAL #3   Title Pt will improve Functional Gait Assessment to at least 20/30 for decreased fall risk.   Time 8   Status New   PT LONG TERM GOAL #4   Title Pt will improve TUG score to less than or equal to 13.5 seconds for decreased fall risk.   Time 8   Period Weeks   Status New   PT LONG TERM GOAL #5   Title Pt will verbalize plans for continued community fitness upon D/C from PT.   Time 8   Period Weeks   Status New               Plan - 03/21/14 1730    Clinical Impression Statement Continues to have difficulty with sequencing gait with cane and with foot clearance with turns.  Pt will continue to benefit from further skilled PT to further address balance, gait and mobility.   Pt will benefit from skilled therapeutic intervention in order to improve on the following deficits Abnormal gait;Decreased balance;Decreased safety awareness;Decreased coordination;Decreased mobility;Decreased strength;Decreased knowledge of use of DME   Rehab Potential Good   PT Frequency 2x / week   PT Duration 8 weeks  wk 2 of 8   PT Treatment/Interventions ADLs/Self Care Home Management;DME Instruction;Stair training;Gait training;Functional mobility training;Therapeutic activities;Neuromuscular re-education;Balance training;Therapeutic exercise;Patient/family education   PT Next Visit Plan gait and transfers; PWR! Moves in standing, weightshifting, turns, change of directions   Consulted and Agree with Plan of Care Patient        Problem List Patient Active Problem List   Diagnosis Date Noted  . Discoloration of skin of foot 03/15/2014  . PD (Parkinson's disease)  03/15/2014  . 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 Essential hypertension 03/15/2014  . HLD (hyperlipidemia) 03/15/2014  . Cough   . LV (left ventricular) mural thrombus   . CVA (cerebral infarction) 01/18/2014  . Imbalance 01/18/2014  . Slurring of speech   . Abnormality of gait 03/12/2012  . Paralysis agitans 03/12/2012  . Acute cholecystitis 02/04/2012  . Hypertension 02/04/2012  .  Hyperlipidemia 02/04/2012    [facility]ares Ramthun W. 03/21/2014, 5:33 PM  Mady Haagensen, PT 03/21/2014 5:34 PM Phone: [phone] Fax: [facility] [address] [place], [place], [address] Phone: [phone]   Fax:  [phone]

## 2014-03-21 NOTE — Therapy (Signed)
[facility] [address] [place], [place], [address] Phone: [phone]   Fax:  [phone]  Speech Language Pathology Treatment  Patient Details  Name: Kyle Cameron MRN: [id number] Date of Birth: [birth year] Referring Provider:  Kandice Hams, MD  Encounter Date: 03/21/2014      End of Session - 03/21/14 1152    Visit Number 3   Number of Visits 16   Date for SLP Re-Evaluation 05/14/14   SLP Start Time 61   SLP Stop Time  1139   SLP Time Calculation (min) 34 min   Activity Tolerance Patient tolerated treatment well      Past Medical History  Diagnosis Date  . Hypertension   . High cholesterol   . Prostate atrophy   . GERD (gastroesophageal reflux disease)   . History of diverticulitis of colon   . Benign enlargement of prostate   . Hypothyroidism   . History of renal calculi   . Parkinson's disease   . Renal calculi   . CVA (cerebral infarction)     Past Surgical History  Procedure Laterality Date  . Cholecystectomy  02/04/2012    Procedure: LAPAROSCOPIC CHOLECYSTECTOMY WITH INTRAOPERATIVE CHOLANGIOGRAM;  Surgeon: Merrie Roof, MD;  Location: [facility];  Service: General;  Laterality: N/A;  . Lithotripsy      renal calculi  . Tonsillectomy    . Cataract extraction, bilateral      There were no vitals taken for this visit.  Visit Diagnosis: Expressive aphasia  Hypokinetic Parkinsonian dysphonia      Subjective Assessment - 03/21/14 1109    Symptoms "No no no pain."             ADULT SLP TREATMENT - 03/21/14 1110    General Information   Behavior/Cognition Alert;Cooperative;Pleasant mood   Treatment Provided   Treatment provided Cognitive-Linquistic   Pain Assessment   Pain Assessment No/denies pain   Cognitive-Linquistic Treatment   Treatment focused on Aphasia   Skilled Treatment Pt reports people cont to ask him to repeat, more frequently than premorbidly. Pt read  sentences with 60% success with reducing rate and improving speech intelligibility.Pt generated sentences with 40% success reducing rate by pausing between words. Homework provided. Pt's conversational speech was functional but labored with dysfluency and dysnomia.    Assessment / Recommendations / Plan   Plan Continue with current plan of care   Progression Toward Goals   Progression toward goals Progressing toward goals            SLP Short Term Goals - 03/21/14 1153    SLP SHORT TERM GOAL #1   Title pt will repeat sentences of 10 words 85% success with modified independence   Time 3   Period Weeks   Status On-going   SLP SHORT TERM GOAL #2   Title pt will use strategies for verbal expresion in simple conversation with occasional min-mod A   Time 3   Period Weeks   Status On-going   SLP SHORT TERM GOAL #3   Title pt will gnerate sentence responses 85% success with modified independence   Time 3   Period Weeks   Status On-going          SLP Long Term Goals - 03/21/14 1154    SLP LONG TERM GOAL #1   Title pt will increase loudness to average 70dB in 5 minutes simple conversation   Time 7   Period Weeks   Status On-going  SLP LONG TERM GOAL #2   Title pt will demo compensations for verbal expression in 10 minutes simple conversation with occasional min A   Time 7   Period Weeks   Status On-going   SLP LONG TERM GOAL #3   Title pt will maintain loud /a/ for average 80dB over 4 sessions   Time 7   Period Weeks   Status On-going          Plan - 03/21/14 1152    Clinical Impression Statement Pt's use of compensations was improved today in context of reading, however was more deficient at spontaneous speech levels.   Speech Therapy Frequency 2x / week   Duration --  7 weeks   Treatment/Interventions Language facilitation;Functional tasks;Cueing hierarchy;Patient/family education;SLP instruction and feedback;Compensatory techniques;Internal/external aids    Potential to Achieve Goals Good   Potential Considerations Co-morbidities        Problem List Patient Active Problem List   Diagnosis Date Noted  . Discoloration of skin of foot 03/15/2014  . PD (Parkinson's disease) 03/15/2014  . 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 Essential hypertension 03/15/2014  . HLD (hyperlipidemia) 03/15/2014  . Cough   . LV (left ventricular) mural thrombus   . CVA (cerebral infarction) 01/18/2014  . Imbalance 01/18/2014  . Slurring of speech   . Abnormality of gait 03/12/2012  . Paralysis agitans 03/12/2012  . Acute cholecystitis 02/04/2012  . Hypertension 02/04/2012  . Hyperlipidemia 02/04/2012    Garald Balding, SLP 03/21/2014, 11:55 AM  [facility] [address] [place], [place], [address] Phone: [phone]   Fax:  [phone]

## 2014-03-21 NOTE — Therapy (Signed)
[facility] [address] [place], [place], [address] Phone: [phone]   Fax:  [phone]  Occupational Therapy Evaluation  Patient Details  Name: Kyle Cameron MRN: [id number] Date of Birth: 1926-03-27 Referring Provider:  Kandice Hams, MD  Encounter Date: 03/21/2014      OT End of Session - 03/21/14 1040    Visit Number 1   Number of Visits 17   Date for OT Re-Evaluation 05/19/14   Authorization Type 1Medicare, 2AARP, G-code needed   Authorization - Visit Number 1   Authorization - Number of Visits 10   OT Start Time 0933   OT Stop Time 1020   OT Time Calculation (min) 47 min   Activity Tolerance Patient tolerated treatment well      Past Medical History  Diagnosis Date  . Hypertension   . High cholesterol   . Prostate atrophy   . GERD (gastroesophageal reflux disease)   . History of diverticulitis of colon   . Benign enlargement of prostate   . Hypothyroidism   . History of renal calculi   . Parkinson's disease   . Renal calculi   . CVA (cerebral infarction)     Past Surgical History  Procedure Laterality Date  . Cholecystectomy  02/04/2012    Procedure: LAPAROSCOPIC CHOLECYSTECTOMY WITH INTRAOPERATIVE CHOLANGIOGRAM;  Surgeon: Merrie Roof, MD;  Location: [facility];  Service: General;  Laterality: N/A;  . Lithotripsy      renal calculi  . Tonsillectomy    . Cataract extraction, bilateral      There were no vitals taken for this visit.  Visit Diagnosis:  Lack of coordination  Hypokinesia  Decreased functional mobility and endurance  Generalized muscle weakness  Abnormal sensation of upper extremity      Subjective Assessment - 03/21/14 0942    Symptoms Pt reports PD worse after CVA.  Pt reports increased difficulty with speech, R hand, and walking.   Pertinent History Pt with PD (diagnosed 3 years ago) with 2 CVAs 12/15 followed by home health therapies.   Patient Stated Goals  improve walking, R hand use, speech   Currently in Pain? No/denies          [facility] OT Assessment - 03/21/14 0001    Assessment   Diagnosis Parkinson's disease, CVA   Onset Date --  12/15 x2 CVA   Prior Therapy 12/16 OT, PT, ST   Precautions   Precautions Fall   Balance Screen   Has the patient fallen in the past 6 months Yes   How many times? 2   Rossie expects to be discharged to: Private residence   Lives With Friend(s)   Prior Function   Level of Independence Independent with basic ADLs;Independent with homemaking with ambulation   Leisure travel, cooking, has returned to gym with personal trainer   ADL   Eating/Feeding Needs assist with cutting food  difficulty dropping fork, difficulty opening packages    Grooming --  electric razor min difficulty, electric toothbrush   Upper Body Bathing Modified independent   Lower Body Bathing Modified independent   Upper Body Dressing Increased time  for fasteners, tying   Lower Body Dressing Increased time;Needs assist for fasteners  occasional assist   Toilet Tranfer Modified independent   Tub/Shower Transfer Modified independent   Equipment Used --  tub seat built in, grab bars   Transfers/Ambulation Related to ADL's occasional difficulty with low, soft chair   IADL  Prior Level of Function Shopping pt performed   Shopping Needs to be accompanied on any shopping trip   Prior Level of Function Light Housekeeping maid   Light Housekeeping Performs light daily tasks such as dishwashing, bed making  has maid   Prior Level of Function Meal Prep cooked a lot and enjoyed it   Meal Prep Able to complete simple cold meal and snack prep  fatigues quickly   Prior Level of Function Community Mobility drove independently   [place] on family or friends for transportation  currently, has not driven since CVA   Medication Management Is responsible for taking medication in correct dosages at  correct time   Prior Level of Function Financial Management pt performed   Financial Management --  can not write checks   Mobility   Mobility Status Freezing;History of falls  with single point cane now   Mobility Status Comments froze multiple times with walking (interruptions, initiation, doorway)   Written Expression   Dominant Hand Right   Handwriting Mild micrographia;Increased time;25% legible   Vision - History   Visual History Cataracts   Additional Comments WFL per pt   Vision Assessment   Vision Assessment Vision not tested   Activity Tolerance   Activity Tolerance --  endurance limits activity   Cognition   Overall Cognitive Status Within Functional Limits for tasks assessed  word finding difficulties now   Observation/Other Assessments   Physical Performance Test   Yes   Simulated Eating Time (seconds) 40.41   Donning Doffing Jacket Time (seconds) 26.71   Sensation   Stereognosis Impaired by gross assessment  per pt    Coordination   9 Hole Peg Test Right;Left   Right 9 Hole Peg Test 140.93sec with multiple drops   Left 9 Hole Peg Test 32.88   Box and Blocks R-39blocks, L-39 blocks   Tremors none   Coordination impulsive with movement/timing difficulties noted   Perception   Perception --  pt report R hearing change   Tone   Assessment Location Right Upper Extremity;Left Upper Extremity   ROM / Strength   AROM / PROM / Strength Strength;AROM   AROM   Overall AROM  Within functional limits for tasks performed   Overall AROM Comments except only approx 110 degrees shoulder flexion bilaterally   Strength   Overall Strength Comments Grossly WNL except R shoulder strength grossly 4/5, L grossly 4+/5   Hand Function   Right Hand Grip (lbs) 45   Left Hand Grip (lbs) 30   RUE Tone   RUE Tone Moderate   LUE Tone   LUE Tone Moderate                         OT Short Term Goals - 03/21/14 1322    OT SHORT TERM GOAL #1   Title Pt will be  independent with updated HEP.--due 04/18/14   Time 4   Period Weeks   Status New   OT SHORT TERM GOAL #2   Title Pt will improve coordination as shown by completing 9-hole peg test in 61min or less with dominant R hand.--due 04/18/14   Baseline 140.93sec   Time 4   Period Weeks   Status New   OT SHORT TERM GOAL #3   Title Pt will be able to write at least 3 sentences with at least 50% legibility.--due 04/18/14   Baseline 25%   Time 4   Period Weeks  Status New   OT SHORT TERM GOAL #4   Title Pt will improve ease with eating as shown by completing PPT#2 in less than 32sec--due 04/18/14   Baseline 40.41   Time 4   Period Weeks   Status New   OT SHORT TERM GOAL #5   Title Pt will improve dressing ability as shown by improving time on PPT#4 by at least 5sec.--due 04/18/14   Baseline 26.71   Time 4   Period Weeks   Status New           OT Long Term Goals - 2014-03-26 1333    OT LONG TERM GOAL #1   Title Pt will verbalize understanidng of adaptive strategies for ADLs/IADLs prn to increase ease/safety.--due 05/19/14   Time 8   Period Weeks   Status New   OT LONG TERM GOAL #2   Title Pt will improve coordination as shown by completing 9-hole peg test in 90sec or less with dominant R hand.--due 05/19/14   Time 8   Period Weeks   Status New   OT LONG TERM GOAL #3   Title Pt will be able to write at least 3 sentences with at least 75% legibility.--due 05/19/14   Baseline 25%   Time 8   Period Weeks   Status New   OT LONG TERM GOAL #4   Title Pt will improve ease with eating as shown by completing PPT#2 in less than 25sec--due 05/19/14   Baseline 40.41   Time 8   Period Weeks   Status New   OT LONG TERM GOAL #5   Title Pt will improve BUE functional reaching/coordination for ADLs as shown by improving score on box and blocks test by at least 5.--due 05/19/14   Baseline 39 bilaterally   Time 8   Period Weeks   Status New   OT LONG TERM GOAL #6   Title Pt will perform simple-mod  complex meal prep mod I.--due 05/19/14   Time 8   Period Weeks   Status New               Plan - 2014/03/26 1044    Clinical Impression Statement Pt with hx of PD and recent CVAs x2 (12/16) affecting RUE functional use, gait/balance, and speech.  Pt reports PD symptoms now worse overall.  Pt presents with decreased strength/activity tolerance, decreased ROM, deccreased coordination, hypokinesia, timing deficits with movement/freezing, decreased functional mobility.   Pt will benefit from skilled therapeutic intervention in order to improve on the following deficits (Retired) Decreased balance;Decreased knowledge of use of DME;Impaired UE functional use;Impaired perceived functional ability;Decreased strength;Decreased mobility;Decreased activity tolerance;Impaired tone;Impaired sensation;Decreased safety awareness;Decreased endurance;Decreased coordination   Rehab Potential Good   OT Frequency 2x / week  +eval   OT Duration 8 weeks   OT Treatment/Interventions Self-care/ADL training;Moist Heat;DME and/or AE instruction;Fluidtherapy;Splinting;Patient/family education;Balance training;Therapeutic exercises;Therapeutic exercise;Therapeutic activities;Functional Mobility Training;Neuromuscular education;Cryotherapy;Energy conservation;Manual Therapy;Electrical Stimulation   Plan updated coordination HEP   Consulted and Agree with Plan of Care Patient          G-Codes - 2014/03/26 1340    Functional Assessment Tool Used PPT#2 40.41sec, 9-hole peg test:  R-140.93sec, 25% writing legibility   Functional Limitation Carrying, moving and handling objects   Carrying, Moving and Handling Objects Current Status [phone]) At least 80 percent but less than 100 percent impaired, limited or restricted   Carrying, Moving and Handling Objects Goal Status ([id number]) At least 20 percent but less than 40 percent impaired, limited or  restricted      Problem List Patient Active Problem List   Diagnosis Date  Noted  . Discoloration of skin of foot 03/15/2014  . PD (Parkinson's disease) 03/15/2014  . 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 Essential hypertension 03/15/2014  . HLD (hyperlipidemia) 03/15/2014  . Cough   . LV (left ventricular) mural thrombus   . CVA (cerebral infarction) 01/18/2014  . Imbalance 01/18/2014  . Slurring of speech   . Abnormality of gait 03/12/2012  . Paralysis agitans 03/12/2012  . Acute cholecystitis 02/04/2012  . Hypertension 02/04/2012  . Hyperlipidemia 02/04/2012    [facility] 03/21/2014, 1:42 PM  [facility] [address] [place], [place], [address] Phone: [phone]   Fax:  [place], OTR/L 03/21/2014 1:43 PM

## 2014-03-22 ENCOUNTER — Ambulatory Visit: Payer: Medicare Other | Attending: Neurology | Admitting: Speech Pathology

## 2014-03-22 DIAGNOSIS — G20 Parkinson's disease: Secondary | ICD-10-CM | POA: Diagnosis not present

## 2014-03-22 DIAGNOSIS — Z5189 Encounter for other specified aftercare: Secondary | ICD-10-CM | POA: Diagnosis present

## 2014-03-22 DIAGNOSIS — R279 Unspecified lack of coordination: Secondary | ICD-10-CM | POA: Insufficient documentation

## 2014-03-22 DIAGNOSIS — R269 Unspecified abnormalities of gait and mobility: Secondary | ICD-10-CM | POA: Diagnosis not present

## 2014-03-22 DIAGNOSIS — R4701 Aphasia: Secondary | ICD-10-CM | POA: Insufficient documentation

## 2014-03-22 NOTE — Patient Instructions (Signed)
   SLOW LOUD OVER-ENNUNCIATE PAUSE  PA TA KA  PATA TAKA KAPA PATAKA  BUTTERCUP  CATERPILLAR  [place]  SLOW AND BIG - EXAGGERATE YOUR MOUTH, MAKE EACH CONSONANT  Read aloud, use external reminders to remind you to be slow and loud Speech exercises - do 5x each, x2-3/day SLOW BIG  SAY THE FOLLOWING- make every sound! Red leather, yellow leather  Big grocery buggy    Purple baby carriage    [facility] Proper copper coffee pot Ripe purple cabbage Three free throws [employer], Blue Bulb Flash Message Dave dipped the dessert  [employer] [place] An Chief Financial Officer for [employer] Five valve levers Six Thick Thistles Stick Double Bubble Gum Fat cows give milk [employer] Gophers Fat frogs flip freely Kohl's into bed Get that game to [employer] Fish Cinnamon aluminum linoleum Black bugs blood Lovely lemon linament Buckle that Education officer, museum Takes Time A Shifty Salt Shaker   The gospel of Mark Shirts shrink, shells shouldn't [place] 49ers Take the tackle box Give me five flapjacks Fundamental relatives Call the cat "Buttercup" A calendar of [place] Four floors to cover Yellow oil ointment Fellow lovers of felines Catastrophe in [place]' plums The church's chimes chimed Telling time until eleven Unique [place] A Three Toed Tree Toad Knapsack Strap Snap [place]

## 2014-03-22 NOTE — Therapy (Signed)
[facility] [address] [place], [place], [address] Phone: [phone]   Fax:  [phone]  Speech Language Pathology Treatment  Patient Details  Name: Kyle Cameron MRN: [id number] Date of Birth: May 29, 1926 Referring Provider:  Kandice Hams, MD  Encounter Date: 03/22/2014      End of Session - 03/22/14 1016    Visit Number 4   Number of Visits 16   Date for SLP Re-Evaluation 05/14/14   SLP Start Time 0933   SLP Stop Time  1015   SLP Time Calculation (min) 42 min   Activity Tolerance Patient tolerated treatment well      Past Medical History  Diagnosis Date  . Hypertension   . High cholesterol   . Prostate atrophy   . GERD (gastroesophageal reflux disease)   . History of diverticulitis of colon   . Benign enlargement of prostate   . Hypothyroidism   . History of renal calculi   . Parkinson's disease   . Renal calculi   . CVA (cerebral infarction)     Past Surgical History  Procedure Laterality Date  . Cholecystectomy  02/04/2012    Procedure: LAPAROSCOPIC CHOLECYSTECTOMY WITH INTRAOPERATIVE CHOLANGIOGRAM;  Surgeon: Merrie Roof, MD;  Location: [facility];  Service: General;  Laterality: N/A;  . Lithotripsy      renal calculi  . Tonsillectomy    . Cataract extraction, bilateral      There were no vitals taken for this visit.  Visit Diagnosis: Expressive aphasia      Subjective Assessment - 03/22/14 0941    Symptoms "He doesn't understand what I say             ADULT SLP TREATMENT - 03/22/14 0944    General Information   Behavior/Cognition Alert;Cooperative;Pleasant mood   Treatment Provided   Treatment provided Cognitive-Linquistic   Pain Assessment   Pain Assessment No/denies pain   Cognitive-Linquistic Treatment   Treatment focused on Aphasia   Skilled Treatment Slow, overarticulated speech - with multisyllabic words/phrases with occassional minimal assistance,, 80%  intelligible in quiet structured environment.  Conversation  with compensations  with usual min to mod A cues and modeling , 70% intellgibile. S/s of verbal apraxia with episodes of groping, halting and stammering.   Assessment / Recommendations / Plan   Plan Continue with current plan of care   Progression Toward Goals   Progression toward goals Progressing toward goals          SLP Education - 03/22/14 1015    Education provided Yes   Education Details Compensations for aphasia   Person(s) Educated Patient   Methods Explanation;Demonstration   Comprehension Verbalized understanding;Returned demonstration;Verbal cues required          SLP Short Term Goals - 03/22/14 1018    SLP SHORT TERM GOAL #1   Title pt will repeat sentences of 10 words 85% success with modified independence   Time 3   Period Weeks   Status On-going   SLP SHORT TERM GOAL #2   Title pt will use strategies for verbal expresion in simple conversation with occasional min-mod A   Time 3   Period Weeks   Status On-going   SLP SHORT TERM GOAL #3   Title pt will gnerate sentence responses 85% success with modified independence   Time 3   Period Weeks   Status On-going          SLP Long Term Goals - 03/22/14 1018  SLP LONG TERM GOAL #1   Title pt will increase loudness to average 70dB in 5 minutes simple conversation   Time 7   Period Weeks   Status On-going   SLP LONG TERM GOAL #2   Title pt will demo compensations for verbal expression in 10 minutes simple conversation with occasional min A   Time 7   Period Weeks   Status On-going   SLP LONG TERM GOAL #3   Title pt will maintain loud /a/ for average 80dB over 4 sessions   Time 7   Period Weeks   Status On-going          Plan - 03/22/14 1017    Clinical Impression Statement Usual to occassional min cues to use compensations in structured speech tasks. Conversation required mod A to carryover compensations. recommend continue skilled ST  to maximize verbal expression and intelligibility.   Speech Therapy Frequency 2x / week   Treatment/Interventions Language facilitation;Functional tasks;Cueing hierarchy;Patient/family education;SLP instruction and feedback;Compensatory techniques;Internal/external aids   Potential to Achieve Goals Good   Potential Considerations Co-morbidities   Consulted and Agree with Plan of Care Patient        Problem List Patient Active Problem List   Diagnosis Date Noted  . Discoloration of skin of foot 03/15/2014  . PD (Parkinson's disease) 03/15/2014  . 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 Essential hypertension 03/15/2014  . HLD (hyperlipidemia) 03/15/2014  . Cough   . LV (left ventricular) mural thrombus   . CVA (cerebral infarction) 01/18/2014  . Imbalance 01/18/2014  . Slurring of speech   . Abnormality of gait 03/12/2012  . Paralysis agitans 03/12/2012  . Acute cholecystitis 02/04/2012  . Hypertension 02/04/2012  . Hyperlipidemia 02/04/2012    Lovv[place], Annye Rusk, SLP 03/22/2014, 10:19 AM  [facility] [address] [place], [place], [address] Phone: [phone]   Fax:  [phone]

## 2014-03-28 ENCOUNTER — Ambulatory Visit (HOSPITAL_COMMUNITY)
Admission: RE | Admit: 2014-03-28 | Discharge: 2014-03-28 | Disposition: A | Discharge status: Home / Self Care | Payer: Medicare Other | Source: Ambulatory Visit | Attending: Surgery | Admitting: Surgery

## 2014-03-28 ENCOUNTER — Ambulatory Visit (INDEPENDENT_AMBULATORY_CARE_PROVIDER_SITE_OTHER)
Admission: RE | Admit: 2014-03-28 | Discharge: 2014-03-28 | Disposition: A | Discharge status: Home / Self Care | Payer: Medicare Other | Source: Ambulatory Visit | Attending: Surgery | Admitting: Surgery

## 2014-03-28 ENCOUNTER — Other Ambulatory Visit: Payer: Self-pay | Admitting: Neurology

## 2014-03-28 DIAGNOSIS — L819 Disorder of pigmentation, unspecified: Secondary | ICD-10-CM

## 2014-03-29 ENCOUNTER — Ambulatory Visit: Payer: Medicare Other | Admitting: Occupational Therapy

## 2014-03-29 ENCOUNTER — Ambulatory Visit: Payer: Medicare Other | Admitting: Physical Therapy

## 2014-03-29 ENCOUNTER — Encounter: Payer: Self-pay | Admitting: Occupational Therapy

## 2014-03-29 DIAGNOSIS — R29898 Other symptoms and signs involving the musculoskeletal system: Secondary | ICD-10-CM

## 2014-03-29 DIAGNOSIS — R269 Unspecified abnormalities of gait and mobility: Secondary | ICD-10-CM

## 2014-03-29 DIAGNOSIS — Z5189 Encounter for other specified aftercare: Secondary | ICD-10-CM | POA: Diagnosis not present

## 2014-03-29 DIAGNOSIS — R6889 Other general symptoms and signs: Secondary | ICD-10-CM

## 2014-03-29 DIAGNOSIS — R279 Unspecified lack of coordination: Secondary | ICD-10-CM

## 2014-03-29 NOTE — Therapy (Signed)
[facility] [address] [place], [place], [address] Phone: [phone]   Fax:  [phone]  Physical Therapy Treatment  Patient Details  Name: Kyle Cameron MRN: [id number] Date of Birth: 03/05/1926 Referring Provider:  Seward Carol, MD  Encounter Date: 03/29/2014      PT End of Session - 03/29/14 1417    Visit Number 4   Number of Visits 17   Date for PT Re-Evaluation 05/15/14   Authorization Type Medicare G-code every 10th visit   PT Start Time 1320   PT Stop Time 1400   PT Time Calculation (min) 40 min   Equipment Utilized During Treatment Gait belt   Activity Tolerance Patient tolerated treatment well   Behavior During Therapy [facility] for tasks assessed/performed      Past Medical History  Diagnosis Date  . Hypertension   . High cholesterol   . Prostate atrophy   . GERD (gastroesophageal reflux disease)   . History of diverticulitis of colon   . Benign enlargement of prostate   . Hypothyroidism   . History of renal calculi   . Parkinson's disease   . Renal calculi   . CVA (cerebral infarction)     Past Surgical History  Procedure Laterality Date  . Cholecystectomy  02/04/2012    Procedure: LAPAROSCOPIC CHOLECYSTECTOMY WITH INTRAOPERATIVE CHOLANGIOGRAM;  Surgeon: Merrie Roof, MD;  Location: [facility];  Service: General;  Laterality: N/A;  . Lithotripsy      renal calculi  . Tonsillectomy    . Cataract extraction, bilateral      There were no vitals taken for this visit.  Visit Diagnosis:  Abnormality of gait  Rigidity      Subjective Assessment - 03/29/14 1320    Symptoms Had ultrasound on foot yesterday-everything is fine in the foot with blood flow; no falls   Currently in Pain? No/denies                    [facility] Adult PT Treatment/Exercise - 03/29/14 0001    Ambulation/Gait   Ambulation/Gait Yes   Ambulation/Gait Assistance 4: Min guard   Ambulation Distance (Feet) 600  Feet  then 400 ft with turns, start/stops and conversation tasks   Assistive device Straight cane   Gait Pattern Decreased step length - right;Decreased step length - left;Narrow base of support;Poor foot clearance - left;Poor foot clearance - right   Ambulation Surface Level;Indoor   Gait Comments progression of gait activities with quick changes of direction and start/stops, then conversation  tasks with gait and cane.  Pt needs frequent cues for gait/cane sequence   High Level Balance   High Level Balance Activities Side stepping;Backward walking;Figure 8 turns;Marching turns;Weight-shifting turns  turns using balance discs   High Level Balance Comments varied direction turning and weight shifting with min guard assistance           [facility]) - 03/29/14 1400    PWR! exercises Moves in standing   PWR! Up 20   PWR! Rock 20  with and without UE coordinated reaches   PWR! Twist x 10 reps with tactile and verbal cues  trunk rotation and diagonal reaches in corner x 10   PWR Step 20  with UE support   Comments cues for deliberate movement patterns and slowed, paced movement               PT Short Term Goals - 03/16/14 1813    PT SHORT TERM GOAL #  1   Title Pt will be independent with HEP for improved transfers, balance, gait.   Time 4   Period Weeks   Status New   PT SHORT TERM GOAL #2   Title Pt will improve 5x sit<>stand to less than or equal to 14.5 seconds with no posterior lean for improved transfer efficiency and safety.   Time 4   Period Weeks   Status New   PT SHORT TERM GOAL #3   Title Pt will improve Functional Gait Assessment score to at least 15/30 for decreased fall risk.   Time 4   Period Weeks   Status New   PT SHORT TERM GOAL #4   Title Pt will improve Timed UP and Go score to less than or equal to 16 seconds for decreased fall risk.   Time 4   Period Weeks   Status New   PT SHORT TERM GOAL #5   Title Pt will verbalize/demonstrate tips to reduce  freezing episodes with gait.   Time 4   Period Weeks   Status New           PT Long Term Goals - 03/16/14 1816    PT LONG TERM GOAL #1   Title Pt will verbalize/demonstrate understanding of fall prevention techniques within home environment. (Target 05/15/14)   Time 8   Period Weeks   Status New   PT LONG TERM GOAL #2   Title perform at least 8 of 10 reps of sit<>stand transfers from <18 inch surfaces, with no posterior lean, for improved efficiency and safety with transfers.   Time 8   Period Weeks   Status New   PT LONG TERM GOAL #3   Title Pt will improve Functional Gait Assessment to at least 20/30 for decreased fall risk.   Time 8   Status New   PT LONG TERM GOAL #4   Title Pt will improve TUG score to less than or equal to 13.5 seconds for decreased fall risk.   Time 8   Period Weeks   Status New   PT LONG TERM GOAL #5   Title Pt will verbalize plans for continued community fitness upon D/C from PT.   Time 8   Period Weeks   Status New               Plan - 03/29/14 1418    Clinical Impression Statement Needs cues to slow pace with gait and balance activities; increased difficulty sequencing cane with conversation tasks with gait.  Pt will continue to benefit from further skilled PT to address balance, giat and mobility.   Pt will benefit from skilled therapeutic intervention in order to improve on the following deficits Abnormal gait;Decreased balance;Decreased safety awareness;Decreased coordination;Decreased mobility;Decreased strength;Decreased knowledge of use of DME   Rehab Potential Good   PT Frequency 2x / week   PT Duration 8 weeks  wk 3 of 8   PT Treatment/Interventions ADLs/Self Care Home Management;DME Instruction;Stair training;Gait training;Functional mobility training;Therapeutic activities;Neuromuscular re-education;Balance training;Therapeutic exercise;Patient/family education   PT Next Visit Plan gait and transfers; PWR! Moves in standing,  weightshifting, turns, change of directions; try to initiate PWR! Moves HEP   Consulted and Agree with Plan of Care Patient        Problem List Patient Active Problem List   Diagnosis Date Noted  . Discoloration of skin of foot 03/15/2014  . PD (Parkinson's disease) 03/15/2014  . 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 Essential  hypertension 03/15/2014  . HLD (hyperlipidemia) 03/15/2014  . Cough   . LV (left ventricular) mural thrombus   . CVA (cerebral infarction) 01/18/2014  . Imbalance 01/18/2014  . Slurring of speech   . Abnormality of gait 03/12/2012  . Paralysis agitans 03/12/2012  . Acute cholecystitis 02/04/2012  . Hypertension 02/04/2012  . Hyperlipidemia 02/04/2012    Breunna Nordmann W. 03/29/2014, 2:21 PM Ismelda Weatherman Gerrit Friends, PT 03/29/2014 2:22 PM Phone: [phone] Fax: [facility] [address] [place], [place], [address] Phone: [phone]   Fax:  [phone]

## 2014-03-29 NOTE — Patient Instructions (Signed)
Coordination Exercises  Perform the following exercises for 20 minutes 1 times per day. Perform with right hand(s). Perform using big movements.   Perform "Flicks"/hand stretches (PWR! Hands): Close hands then flick out your fingers with focus on opening hands, pulling wrists back, and extending elbows like you are pushing.  Flipping Cards: Place deck of cards on the table. Flip cards over by opening your hand big to grasp and then turn your palm up big.  TURN DECK TO THE SIDE AND GET ALL 4 FINGERS ON IT.  PULL IT OFF EDGE OF TABLE.  Deal cards: Hold 1/2 or whole deck in your hand. Use thumb to push card off top of deck with one big push.  TRY TO DO 10 IN A ROW BIG (THEN INCREASE TO 15)  Toss ball in the air and catch with the same hand: Toss big/high.  Pick up coins and place in coin bank or container: Pick up with big, intentional movements. Do not drag coin to the edge.  Practice writing: Slow down, write big, and focus on forming each letter.  Practice ZIG ZAGS, CIRCLES, VERTICAL LINES ON LINED PAPER FOCUS ON TOUCHING TOP AND BOTTOM OF LINE WITH BIG MOVEMENTS  Pick up various small objects that are different shapes/sizes and place in container. (paperclips, buttons, keys, dried beans/pasta, coins, screws, nuts/bolts, washers, board game pieces, etc.)  Fasten nuts/bolts or put on bottle caps: Turn as much/as big as you can with each turn.

## 2014-03-29 NOTE — Therapy (Signed)
[facility] [address] [place], [place], [address] Phone: [phone]   Fax:  [phone]  Occupational Therapy Treatment  Patient Details  Name: Kyle Cameron MRN: [id number] Date of Birth: 1926-02-09 Referring Provider:  Seward Carol, MD  Encounter Date: 03/29/2014      OT End of Session - 03/29/14 1456    Visit Number 2   Number of Visits 17   Date for OT Re-Evaluation 05/19/14   Authorization Type 1Medicare, 2AARP, G-code needed   Authorization - Visit Number 2   Authorization - Number of Visits 10   OT Start Time 1406   OT Stop Time 1446   OT Time Calculation (min) 40 min   Activity Tolerance Patient tolerated treatment well      Past Medical History  Diagnosis Date  . Hypertension   . High cholesterol   . Prostate atrophy   . GERD (gastroesophageal reflux disease)   . History of diverticulitis of colon   . Benign enlargement of prostate   . Hypothyroidism   . History of renal calculi   . Parkinson's disease   . Renal calculi   . CVA (cerebral infarction)     Past Surgical History  Procedure Laterality Date  . Cholecystectomy  02/04/2012    Procedure: LAPAROSCOPIC CHOLECYSTECTOMY WITH INTRAOPERATIVE CHOLANGIOGRAM;  Surgeon: Merrie Roof, MD;  Location: [facility];  Service: General;  Laterality: N/A;  . Lithotripsy      renal calculi  . Tonsillectomy    . Cataract extraction, bilateral      There were no vitals taken for this visit.  Visit Diagnosis:  Hypokinesia  Lack of coordination  Rigidity      Subjective Assessment - 03/29/14 1454    Symptoms "I'm tired."  "It just won't work"  "I knocked over a drink at lunch"   Pertinent History Pt with PD (diagnosed 3 years ago) with 2 CVAs 12/15 followed by home health therapies.  Pt reports PD worse after CVA.  Pt reports increased difficulty with speech, R hand, and walking.   Currently in Pain? No/denies                         OT Education - 03/29/14 1453    Education Details Coordination HEP   Person(s) Educated Patient   Methods Explanation;Demonstration;Verbal cues;Tactile cues;Handout   Comprehension Verbal cues required;Tactile cues required;Returned demonstration;Verbalized understanding  for big movements          OT Short Term Goals - 03/21/14 1322    OT SHORT TERM GOAL #1   Title Pt will be independent with updated HEP.--due 04/18/14   Time 4   Period Weeks   Status New   OT SHORT TERM GOAL #2   Title Pt will improve coordination as shown by completing 9-hole peg test in 74min or less with dominant R hand.--due 04/18/14   Baseline 140.93sec   Time 4   Period Weeks   Status New   OT SHORT TERM GOAL #3   Title Pt will be able to write at least 3 sentences with at least 50% legibility.--due 04/18/14   Baseline 25%   Time 4   Period Weeks   Status New   OT SHORT TERM GOAL #4   Title Pt will improve ease with eating as shown by completing PPT#2 in less than 32sec--due 04/18/14   Baseline 40.41   Time 4   Period Weeks   Status New  OT SHORT TERM GOAL #5   Title Pt will improve dressing ability as shown by improving time on PPT#4 by at least 5sec.--due 04/18/14   Baseline 26.71   Time 4   Period Weeks   Status New           OT Long Term Goals - 03/21/14 1333    OT LONG TERM GOAL #1   Title Pt will verbalize understanidng of adaptive strategies for ADLs/IADLs prn to increase ease/safety.--due 05/19/14   Time 8   Period Weeks   Status New   OT LONG TERM GOAL #2   Title Pt will improve coordination as shown by completing 9-hole peg test in 90sec or less with dominant R hand.--due 05/19/14   Time 8   Period Weeks   Status New   OT LONG TERM GOAL #3   Title Pt will be able to write at least 3 sentences with at least 75% legibility.--due 05/19/14   Baseline 25%   Time 8   Period Weeks   Status New   OT LONG TERM GOAL #4   Title Pt will improve  ease with eating as shown by completing PPT#2 in less than 25sec--due 05/19/14   Baseline 40.41   Time 8   Period Weeks   Status New   OT LONG TERM GOAL #5   Title Pt will improve BUE functional reaching/coordination for ADLs as shown by improving score on box and blocks test by at least 5.--due 05/19/14   Baseline 39 bilaterally   Time 8   Period Weeks   Status New   OT LONG TERM GOAL #6   Title Pt will perform simple-mod complex meal prep mod I.--due 05/19/14   Time 8   Period Weeks   Status New               Plan - 03/29/14 1457    Clinical Impression Statement Pt needs verbal and tactile cueing for timing and movement amplitude which is impacing RUE functional use/coordination.   Plan coordination, big movements        Problem List Patient Active Problem List   Diagnosis Date Noted  . Discoloration of skin of foot 03/15/2014  . PD (Parkinson's disease) 03/15/2014  . 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 Essential hypertension 03/15/2014  . HLD (hyperlipidemia) 03/15/2014  . Cough   . LV (left ventricular) mural thrombus   . CVA (cerebral infarction) 01/18/2014  . Imbalance 01/18/2014  . Slurring of speech   . Abnormality of gait 03/12/2012  . Paralysis agitans 03/12/2012  . Acute cholecystitis 02/04/2012  . Hypertension 02/04/2012  . Hyperlipidemia 02/04/2012    [facility] 03/29/2014, 3:01 PM  [facility] [address] [place], [place], [address] Phone: [phone]   Fax:  [place], OTR/L 03/29/2014 3:01 PM

## 2014-03-31 ENCOUNTER — Ambulatory Visit: Payer: Medicare Other | Admitting: Physical Therapy

## 2014-03-31 ENCOUNTER — Ambulatory Visit: Payer: Medicare Other | Admitting: Occupational Therapy

## 2014-03-31 ENCOUNTER — Encounter: Payer: Self-pay | Admitting: Physical Therapy

## 2014-03-31 ENCOUNTER — Encounter: Payer: Self-pay | Admitting: Occupational Therapy

## 2014-03-31 DIAGNOSIS — R29898 Other symptoms and signs involving the musculoskeletal system: Secondary | ICD-10-CM

## 2014-03-31 DIAGNOSIS — Z5189 Encounter for other specified aftercare: Secondary | ICD-10-CM | POA: Diagnosis not present

## 2014-03-31 DIAGNOSIS — R6889 Other general symptoms and signs: Secondary | ICD-10-CM

## 2014-03-31 DIAGNOSIS — R279 Unspecified lack of coordination: Secondary | ICD-10-CM

## 2014-03-31 DIAGNOSIS — Z7409 Other reduced mobility: Secondary | ICD-10-CM

## 2014-03-31 DIAGNOSIS — R269 Unspecified abnormalities of gait and mobility: Secondary | ICD-10-CM

## 2014-03-31 NOTE — Therapy (Signed)
[name] [address] [place], [place], [address] Phone: [phone]   Fax:  [phone]  Occupational Therapy Treatment  Patient Details  Name: Kyle Cameron MRN: [id number] Date of Birth: 1926-04-07 Referring Provider:  Seward Carol, MD  Encounter Date: 03/31/2014      OT End of Session - 03/31/14 1507    Visit Number 3   Number of Visits 17   Date for OT Re-Evaluation 05/19/14   Authorization Type 1Medicare, 2AARP, G-code needed   Authorization - Visit Number 2   Authorization - Number of Visits 10   OT Start Time 1447   OT Stop Time 1530   OT Time Calculation (min) 43 min   Activity Tolerance Patient tolerated treatment well   Behavior During Therapy Impulsive  pt easily frustrated      Past Medical History  Diagnosis Date  . Hypertension   . High cholesterol   . Prostate atrophy   . GERD (gastroesophageal reflux disease)   . History of diverticulitis of colon   . Benign enlargement of prostate   . Hypothyroidism   . History of renal calculi   . Parkinson's disease   . Renal calculi   . CVA (cerebral infarction)     Past Surgical History  Procedure Laterality Date  . Cholecystectomy  02/04/2012    Procedure: LAPAROSCOPIC CHOLECYSTECTOMY WITH INTRAOPERATIVE CHOLANGIOGRAM;  Surgeon: Merrie Roof, MD;  Location: [facility];  Service: General;  Laterality: N/A;  . Lithotripsy      renal calculi  . Tonsillectomy    . Cataract extraction, bilateral      There were no vitals filed for this visit.  Visit Diagnosis:  Hypokinesia  Rigidity  Lack of coordination      Subjective Assessment - 03/31/14 1453    Symptoms Pt reports that he has not done coordination HEP (except PWR! hands)   Pertinent History Pt with PD (diagnosed 3 years ago) with 2 CVAs 12/15 followed by home health therapies.  Pt reports PD worse after CVA.  Pt reports increased difficulty with speech, R hand, and walking.   Currently in Pain? No/denies                    OT Treatments/Exercises (OP) - 03/31/14 1543    ADLs   Writing Practiced drawing simple shapes in prep for writing with focus on size and hitting visual target of line.  Then progressed with writing name with min-mod decrease in size for name.  Good size for shapes.                OT Education - 03/31/14 1454    Education Details Reviewed PWR! hand; emphasized that repetition of big amplitude movements necessary for change   Person(s) Educated Patient   Methods Explanation;Demonstration   Comprehension Verbalized understanding;Returned demonstration;Verbal cues required  for big movements/timing with movements          OT Short Term Goals - 03/21/14 1322    OT SHORT TERM GOAL #1   Title Pt will be independent with updated HEP.--due 04/18/14   Time 4   Period Weeks   Status New   OT SHORT TERM GOAL #2   Title Pt will improve coordination as shown by completing 9-hole peg test in 21min or less with dominant R hand.--due 04/18/14   Baseline 140.93sec   Time 4   Period Weeks   Status New   OT SHORT TERM GOAL #3  Title Pt will be able to write at least 3 sentences with at least 50% legibility.--due 04/18/14   Baseline 25%   Time 4   Period Weeks   Status New   OT SHORT TERM GOAL #4   Title Pt will improve ease with eating as shown by completing PPT#2 in less than 32sec--due 04/18/14   Baseline 40.41   Time 4   Period Weeks   Status New   OT SHORT TERM GOAL #5   Title Pt will improve dressing ability as shown by improving time on PPT#4 by at least 5sec.--due 04/18/14   Baseline 26.71   Time 4   Period Weeks   Status New           OT Long Term Goals - 03/21/14 1333    OT LONG TERM GOAL #1   Title Pt will verbalize understanidng of adaptive strategies for ADLs/IADLs prn to increase ease/safety.--due 05/19/14   Time 8   Period Weeks   Status New   OT LONG TERM GOAL #2   Title Pt will improve  coordination as shown by completing 9-hole peg test in 90sec or less with dominant R hand.--due 05/19/14   Time 8   Period Weeks   Status New   OT LONG TERM GOAL #3   Title Pt will be able to write at least 3 sentences with at least 75% legibility.--due 05/19/14   Baseline 25%   Time 8   Period Weeks   Status New   OT LONG TERM GOAL #4   Title Pt will improve ease with eating as shown by completing PPT#2 in less than 25sec--due 05/19/14   Baseline 40.41   Time 8   Period Weeks   Status New   OT LONG TERM GOAL #5   Title Pt will improve BUE functional reaching/coordination for ADLs as shown by improving score on box and blocks test by at least 5.--due 05/19/14   Baseline 39 bilaterally   Time 8   Period Weeks   Status New   OT LONG TERM GOAL #6   Title Pt will perform simple-mod complex meal prep mod I.--due 05/19/14   Time 8   Period Weeks   Status New               Plan - 03/31/14 1521    Clinical Impression Statement Pt easily frustrated and needed prompts to continue and cues for big movements and timing which worsened with frustration.   Plan coordination, big movements with with slow, deliberate effort         Problem List Patient Active Problem List   Diagnosis Date Noted  . Discoloration of skin of foot 03/15/2014  . PD (Parkinson's disease) 03/15/2014  . 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 Essential hypertension 03/15/2014  . HLD (hyperlipidemia) 03/15/2014  . Cough   . LV (left ventricular) mural thrombus   . CVA (cerebral infarction) 01/18/2014  . Imbalance 01/18/2014  . Slurring of speech   . Abnormality of gait 03/12/2012  . Paralysis agitans 03/12/2012  . Acute cholecystitis 02/04/2012  . Hypertension 02/04/2012  . Hyperlipidemia 02/04/2012    [facility] 03/31/2014, 4:15 PM  [facility] [address] [place], [place], [address] Phone:  [phone]   Fax:  [place], OTR/L 03/31/2014 4:15 PM

## 2014-03-31 NOTE — Therapy (Signed)
[facility] [address] [place], [place], [address] Phone: [phone]   Fax:  [phone]  Physical Therapy Treatment  Patient Details  Name: Kyle Cameron MRN: [id number] Date of Birth: [birth year] Referring Provider:  Seward Carol, MD  Encounter Date: 03/31/2014      PT End of Session - 03/31/14 1542    Visit Number 5   Number of Visits 17   Date for PT Re-Evaluation 05/15/14   Authorization Type Medicare G-code every 10th visit   PT Start Time 1404   PT Stop Time 1448   PT Time Calculation (min) 44 min   Equipment Utilized During Treatment Gait belt   Activity Tolerance Patient tolerated treatment well   Behavior During Therapy Impulsive  pt easily frustrated      Past Medical History  Diagnosis Date  . Hypertension   . High cholesterol   . Prostate atrophy   . GERD (gastroesophageal reflux disease)   . History of diverticulitis of colon   . Benign enlargement of prostate   . Hypothyroidism   . History of renal calculi   . Parkinson's disease   . Renal calculi   . CVA (cerebral infarction)     Past Surgical History  Procedure Laterality Date  . Cholecystectomy  02/04/2012    Procedure: LAPAROSCOPIC CHOLECYSTECTOMY WITH INTRAOPERATIVE CHOLANGIOGRAM;  Surgeon: Merrie Roof, MD;  Location: [facility];  Service: General;  Laterality: N/A;  . Lithotripsy      renal calculi  . Tonsillectomy    . Cataract extraction, bilateral      There were no vitals filed for this visit.  Visit Diagnosis:  Decreased functional mobility and endurance  Abnormality of gait      Subjective Assessment - 03/31/14 1410    Symptoms Denies falls or changes since last visit.   Currently in Pain? No/denies                       [facility] Adult PT Treatment/Exercise - 03/31/14 0001    Transfers   Transfers Sit to Stand;Stand to Sit   Sit to Stand 7: Independent;With upper extremity assist;With  armrests;From chair/3-in-1   Stand to Sit 7: Independent   Knee/Hip Exercises: Aerobic   Stationary Bike Scifit level 1.5 all 4 extremities x 6 minutes.           [facility]) - 03/31/14 1536    PWR! exercises Moves in sitting;Moves in standing   PWR! Up 20   PWR! Rock 20   PWR! Twist 20   PWR Step 20   Comments cues for deliberate movement and large amplitude   PWR! Up 20   PWR! Rock 20   PWR! Twist 20   PWR! Step 20   Basic 4 Flow 20   Comments cues for deliberate, large amplitude          Balance Exercises - 03/31/14 1538    Balance Exercises: Standing   Turning 5 reps;Other (comment)  Sit to stand and ambulating around obstacles then sit   Other Standing Exercises 360 degree turns with chairs/counter as targets.  Cues to complete in as few steps as possible to decrease freezing.   Overall Comments Other (comment)  pt continues with freezing episodes when turning           PT Education - 03/31/14 1541    Education provided Yes   Education Details Seated and Standing PWR!    Person(s) Educated Patient  Methods Explanation;Demonstration;Handout   Comprehension Verbalized understanding;Verbal cues required          PT Short Term Goals - 03/16/14 1813    PT SHORT TERM GOAL #1   Title Pt will be independent with HEP for improved transfers, balance, gait.   Time 4   Period Weeks   Status New   PT SHORT TERM GOAL #2   Title Pt will improve 5x sit<>stand to less than or equal to 14.5 seconds with no posterior lean for improved transfer efficiency and safety.   Time 4   Period Weeks   Status New   PT SHORT TERM GOAL #3   Title Pt will improve Functional Gait Assessment score to at least 15/30 for decreased fall risk.   Time 4   Period Weeks   Status New   PT SHORT TERM GOAL #4   Title Pt will improve Timed UP and Go score to less than or equal to 16 seconds for decreased fall risk.   Time 4   Period Weeks   Status New   PT SHORT TERM GOAL #5   Title  Pt will verbalize/demonstrate tips to reduce freezing episodes with gait.   Time 4   Period Weeks   Status New           PT Long Term Goals - 03/16/14 1816    PT LONG TERM GOAL #1   Title Pt will verbalize/demonstrate understanding of fall prevention techniques within home environment. (Target 05/15/14)   Time 8   Period Weeks   Status New   PT LONG TERM GOAL #2   Title perform at least 8 of 10 reps of sit<>stand transfers from <18 inch surfaces, with no posterior lean, for improved efficiency and safety with transfers.   Time 8   Period Weeks   Status New   PT LONG TERM GOAL #3   Title Pt will improve Functional Gait Assessment to at least 20/30 for decreased fall risk.   Time 8   Status New   PT LONG TERM GOAL #4   Title Pt will improve TUG score to less than or equal to 13.5 seconds for decreased fall risk.   Time 8   Period Weeks   Status New   PT LONG TERM GOAL #5   Title Pt will verbalize plans for continued community fitness upon D/C from PT.   Time 8   Period Weeks   Status New               Plan - 03/31/14 1543    Clinical Impression Statement Continues to need cues for slower pace and large amplitude.  Continue PT per POC.   Pt will benefit from skilled therapeutic intervention in order to improve on the following deficits Abnormal gait;Decreased balance;Decreased safety awareness;Decreased coordination;Decreased mobility;Decreased strength;Decreased knowledge of use of DME   Rehab Potential Good   PT Frequency 2x / week   PT Duration 8 weeks   PT Treatment/Interventions ADLs/Self Care Home Management;DME Instruction;Stair training;Gait training;Functional mobility training;Therapeutic activities;Neuromuscular re-education;Balance training;Therapeutic exercise;Patient/family education   PT Next Visit Plan Continue weightshifting, turns, change of directions.   Consulted and Agree with Plan of Care Patient        Problem List Patient Active Problem  List   Diagnosis Date Noted  . Discoloration of skin of foot 03/15/2014  . PD (Parkinson's disease) 03/15/2014  . 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 Essential hypertension 03/15/2014  . HLD (  hyperlipidemia) 03/15/2014  . Cough   . LV (left ventricular) mural thrombus   . CVA (cerebral infarction) 01/18/2014  . Imbalance 01/18/2014  . Slurring of speech   . Abnormality of gait 03/12/2012  . Paralysis agitans 03/12/2012  . Acute cholecystitis 02/04/2012  . Hypertension 02/04/2012  . Hyperlipidemia 02/04/2012    [name] 03/31/2014, 3:50 PM  [facility] [address] [place], [place], [address] Phone: [phone]   Fax:  [place], [place] 03/31/2014 3:50 PM Phone: [phone] Fax: [phone]

## 2014-04-05 ENCOUNTER — Ambulatory Visit: Payer: Medicare Other

## 2014-04-05 ENCOUNTER — Encounter: Payer: Self-pay | Admitting: Physical Therapy

## 2014-04-05 ENCOUNTER — Ambulatory Visit: Payer: Medicare Other | Admitting: Physical Therapy

## 2014-04-05 ENCOUNTER — Ambulatory Visit: Payer: Medicare Other | Admitting: Occupational Therapy

## 2014-04-05 DIAGNOSIS — R29898 Other symptoms and signs involving the musculoskeletal system: Secondary | ICD-10-CM

## 2014-04-05 DIAGNOSIS — R6889 Other general symptoms and signs: Secondary | ICD-10-CM

## 2014-04-05 DIAGNOSIS — M6281 Muscle weakness (generalized): Secondary | ICD-10-CM

## 2014-04-05 DIAGNOSIS — R279 Unspecified lack of coordination: Secondary | ICD-10-CM

## 2014-04-05 DIAGNOSIS — R490 Dysphonia: Secondary | ICD-10-CM

## 2014-04-05 DIAGNOSIS — R4701 Aphasia: Secondary | ICD-10-CM

## 2014-04-05 DIAGNOSIS — R269 Unspecified abnormalities of gait and mobility: Secondary | ICD-10-CM

## 2014-04-05 DIAGNOSIS — G20 Parkinson's disease: Secondary | ICD-10-CM

## 2014-04-05 DIAGNOSIS — Z5189 Encounter for other specified aftercare: Secondary | ICD-10-CM | POA: Diagnosis not present

## 2014-04-05 NOTE — Therapy (Signed)
[facility] [address] [place], [place], [address] Phone: [phone]   Fax:  [phone]  Physical Therapy Treatment  Patient Details  Name: Kyle Cameron MRN: [id number] Date of Birth: 01/14/1927 Referring Provider:  Seward Carol, MD  Encounter Date: 04/05/2014      PT End of Session - 04/05/14 2147    Visit Number 6   Number of Visits 17   Date for PT Re-Evaluation 05/15/14   Authorization Type Medicare G-code every 10th visit   PT Start Time 1106   PT Stop Time 1145   PT Time Calculation (min) 39 min   Equipment Utilized During Treatment Gait belt   Activity Tolerance Patient tolerated treatment well   Behavior During Therapy Impulsive      Past Medical History  Diagnosis Date  . Hypertension   . High cholesterol   . Prostate atrophy   . GERD (gastroesophageal reflux disease)   . History of diverticulitis of colon   . Benign enlargement of prostate   . Hypothyroidism   . History of renal calculi   . Parkinson's disease   . Renal calculi   . CVA (cerebral infarction)     Past Surgical History  Procedure Laterality Date  . Cholecystectomy  02/04/2012    Procedure: LAPAROSCOPIC CHOLECYSTECTOMY WITH INTRAOPERATIVE CHOLANGIOGRAM;  Surgeon: Merrie Roof, MD;  Location: [facility];  Service: General;  Laterality: N/A;  . Lithotripsy      renal calculi  . Tonsillectomy    . Cataract extraction, bilateral      There were no vitals filed for this visit.  Visit Diagnosis:  Abnormality of gait      Subjective Assessment - 04/05/14 1109    Symptoms Denies falls or changes.     Currently in Pain? No/denies                       [facility] Adult PT Treatment/Exercise - 04/05/14 2142    Transfers   Transfers Sit to Stand;Stand to Sit   Sit to Stand 7: Independent;With upper extremity assist;With armrests;From chair/3-in-1   Sit to Stand Details (indicate cue type and reason) repeated x 5  x 3 sets working on forward lean   Stand to Sit 7: Independent   Ambulation/Gait   Ambulation/Gait Yes   Ambulation/Gait Assistance 5: Supervision;4: Min guard   Ambulation/Gait Assistance Details Significant freezing espisodes with initiating gait and turns;not as significant with stopping and starting on command   Ambulation Distance (Feet) 400 Feet  three times   Assistive device Straight cane   Gait Pattern Decreased step length - right;Decreased step length - left;Narrow base of support;Poor foot clearance - left;Poor foot clearance - right   Ambulation Surface Level;Indoor   High Level Balance   High Level Balance Activities Side stepping;Backward walking;Direction changes;Turns;Sudden stops   High Level Balance Comments forward, backward, sideways and step thru weight shifts/stepping in parallel bars focusing on step length   Knee/Hip Exercises: Aerobic   Tread Mill x 9 minutes total working on heel strike, speed, step length.  Kicking blue therapy ball.  Pt unable to significantly change step length, speed or heel strike today despite cues and various techniques                  PT Short Term Goals - 03/16/14 1813    PT SHORT TERM GOAL #1   Title Pt will be independent with HEP for improved transfers, balance, gait.  Time 4   Period Weeks   Status New   PT SHORT TERM GOAL #2   Title Pt will improve 5x sit<>stand to less than or equal to 14.5 seconds with no posterior lean for improved transfer efficiency and safety.   Time 4   Period Weeks   Status New   PT SHORT TERM GOAL #3   Title Pt will improve Functional Gait Assessment score to at least 15/30 for decreased fall risk.   Time 4   Period Weeks   Status New   PT SHORT TERM GOAL #4   Title Pt will improve Timed UP and Go score to less than or equal to 16 seconds for decreased fall risk.   Time 4   Period Weeks   Status New   PT SHORT TERM GOAL #5   Title Pt will verbalize/demonstrate tips to reduce  freezing episodes with gait.   Time 4   Period Weeks   Status New           PT Long Term Goals - 03/16/14 1816    PT LONG TERM GOAL #1   Title Pt will verbalize/demonstrate understanding of fall prevention techniques within home environment. (Target 05/15/14)   Time 8   Period Weeks   Status New   PT LONG TERM GOAL #2   Title perform at least 8 of 10 reps of sit<>stand transfers from <18 inch surfaces, with no posterior lean, for improved efficiency and safety with transfers.   Time 8   Period Weeks   Status New   PT LONG TERM GOAL #3   Title Pt will improve Functional Gait Assessment to at least 20/30 for decreased fall risk.   Time 8   Status New   PT LONG TERM GOAL #4   Title Pt will improve TUG score to less than or equal to 13.5 seconds for decreased fall risk.   Time 8   Period Weeks   Status New   PT LONG TERM GOAL #5   Title Pt will verbalize plans for continued community fitness upon D/C from PT.   Time 8   Period Weeks   Status New               Plan - 04/05/14 2148    Clinical Impression Statement Pt continues with significant freezing episodes.  Little change in gait pattern today despite cues.  Impulsive at times during session.  Continue PT per POC.   Pt will benefit from skilled therapeutic intervention in order to improve on the following deficits Abnormal gait;Decreased balance;Decreased safety awareness;Decreased coordination;Decreased mobility;Decreased strength;Decreased knowledge of use of DME   Rehab Potential Good   PT Frequency 2x / week   PT Duration 8 weeks   PT Treatment/Interventions ADLs/Self Care Home Management;DME Instruction;Stair training;Gait training;Functional mobility training;Therapeutic activities;Neuromuscular re-education;Balance training;Therapeutic exercise;Patient/family education   PT Next Visit Plan Continue gait working on decreasing freezing, bilateral heel strike and stride length.   Consulted and Agree with Plan of  Care Patient        Problem List Patient Active Problem List   Diagnosis Date Noted  . Discoloration of skin of foot 03/15/2014  . PD (Parkinson's disease) 03/15/2014  . 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 Essential hypertension 03/15/2014  . HLD (hyperlipidemia) 03/15/2014  . Cough   . LV (left ventricular) mural thrombus   . CVA (cerebral infarction) 01/18/2014  . Imbalance 01/18/2014  . Slurring of speech   .  Abnormality of gait 03/12/2012  . Paralysis agitans 03/12/2012  . Acute cholecystitis 02/04/2012  . Hypertension 02/04/2012  . Hyperlipidemia 02/04/2012    [name] 04/05/2014, 9:50 PM  [facility] [address] [place], [place], [address] Phone: [phone]   Fax:  [place], [place] 04/05/2014 9:50 PM Phone: [phone] Fax: [phone]

## 2014-04-05 NOTE — Therapy (Signed)
[facility] [address] [place], [place], [address] Phone: [phone]   Fax:  [phone]  Speech Language Pathology Treatment  Patient Details  Name: Kyle Cameron MRN: [id number] Date of Birth: 07-Mar-1926 Referring Provider:  Seward Carol, MD  Encounter Date: 04/05/2014      End of Session - 04/05/14 1247    Visit Number 5   Number of Visits 16   Date for SLP Re-Evaluation 05/14/14   SLP Start Time 101   SLP Stop Time  1229   SLP Time Calculation (min) 39 min   Activity Tolerance Patient tolerated treatment well      Past Medical History  Diagnosis Date  . Hypertension   . High cholesterol   . Prostate atrophy   . GERD (gastroesophageal reflux disease)   . History of diverticulitis of colon   . Benign enlargement of prostate   . Hypothyroidism   . History of renal calculi   . Parkinson's disease   . Renal calculi   . CVA (cerebral infarction)     Past Surgical History  Procedure Laterality Date  . Cholecystectomy  02/04/2012    Procedure: LAPAROSCOPIC CHOLECYSTECTOMY WITH INTRAOPERATIVE CHOLANGIOGRAM;  Surgeon: Merrie Roof, MD;  Location: [facility];  Service: General;  Laterality: N/A;  . Lithotripsy      renal calculi  . Tonsillectomy    . Cataract extraction, bilateral      There were no vitals filed for this visit.  Visit Diagnosis: Expressive aphasia  Hypokinetic Parkinsonian dysphonia      Subjective Assessment - 04/05/14 1152    Symptoms "I still don't have the volume."               ADULT SLP TREATMENT - 04/05/14 1152    General Information   Behavior/Cognition Alert;Cooperative;Pleasant mood   Treatment Provided   Treatment provided Cognitive-Linquistic   Pain Assessment   Pain Assessment No/denies pain   Cognitive-Linquistic Treatment   Treatment focused on Aphasia   Skilled Treatment Pt read crossword clues with usual min-mod A for reduced speech rate and  overarticulation - 25% success. Rare semantic paraphasias in answers, and occasional perseveration noted in pt's reading of clues. Pt with linguistic additions as well ("of", "the") rarely when reading clues. These linguistic errors went largely unnoticed by pt. Hesitations due to aphasia/apraxia were minimized greatly in structured tasks (sentences) with usual SLP cue to "put a pause between each of your words"   Assessment / Recommendations / [place] with current plan of care   Progression Toward Goals   Progression toward goals Progressing toward goals          SLP Education - 04/05/14 1246    Education provided Yes   Education Details compensations for speech errors ("put a pause between your words")   Person(s) Educated Patient   Methods Explanation;Demonstration;Verbal cues   Comprehension Verbalized understanding;Returned demonstration;Need further instruction          SLP Short Term Goals - 04/05/14 1248    SLP SHORT TERM GOAL #1   Title pt will repeat sentences of 10 words 85% success with modified independence   Time 2   Period Weeks   Status On-going   SLP SHORT TERM GOAL #2   Title pt will use strategies for verbal expresion in simple conversation with occasional min-mod A   Time 2   Period Weeks   Status On-going   SLP SHORT TERM GOAL #3  Title pt will gnerate sentence responses 85% success with modified independence   Time 2   Period Weeks   Status On-going          SLP Long Term Goals - 04/05/14 1248    SLP LONG TERM GOAL #1   Title pt will increase loudness to average 70dB in 5 minutes simple conversation   Time 6   Period Weeks   Status On-going   SLP LONG TERM GOAL #2   Title pt will demo compensations for verbal expression in 10 minutes simple conversation with occasional min A   Time 6   Period Weeks   Status On-going   SLP LONG TERM GOAL #3   Title pt will maintain loud /a/ for average 80dB over 4 sessions   Time 6   Period  Weeks   Status On-going          Plan - 04/05/14 1247    Clinical Impression Statement Cues remain necessary from SLP for pt success with reducing rate in structured tasks.    Speech Therapy Frequency 2x / week   Duration --  6 weeks   Treatment/Interventions Language facilitation;Functional tasks;Cueing hierarchy;Patient/family education;SLP instruction and feedback;Compensatory techniques;Internal/external aids   Potential to Achieve Goals Good   Potential Considerations Co-morbidities        Problem List Patient Active Problem List   Diagnosis Date Noted  . Discoloration of skin of foot 03/15/2014  . PD (Parkinson's disease) 03/15/2014  . 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 Essential hypertension 03/15/2014  . HLD (hyperlipidemia) 03/15/2014  . Cough   . LV (left ventricular) mural thrombus   . CVA (cerebral infarction) 01/18/2014  . Imbalance 01/18/2014  . Slurring of speech   . Abnormality of gait 03/12/2012  . Paralysis agitans 03/12/2012  . Acute cholecystitis 02/04/2012  . Hypertension 02/04/2012  . Hyperlipidemia 02/04/2012    Quinette Hentges,SLP 04/05/2014, 12:49 PM  [facility] [address] [place], [place], [address] Phone: [phone]   Fax:  [phone]

## 2014-04-05 NOTE — Therapy (Signed)
[facility] [address] [place], [place], [address] Phone: [phone]   Fax:  [phone]  Occupational Therapy Treatment  Patient Details  Name: Kyle Cameron MRN: [id number] Date of Birth: 1926/08/28 Referring Provider:  Seward Carol, MD  Encounter Date: 04/05/2014      OT End of Session - 04/05/14 1028    Visit Number 4   Number of Visits 17   Date for OT Re-Evaluation 05/19/14   Authorization Type 1Medicare, 2AARP, G-code needed   Authorization - Visit Number 4   Authorization - Number of Visits 10   OT Start Time 1019   OT Stop Time 1100   OT Time Calculation (min) 41 min   Activity Tolerance Patient tolerated treatment well   Behavior During Therapy Impulsive      Past Medical History  Diagnosis Date  . Hypertension   . High cholesterol   . Prostate atrophy   . GERD (gastroesophageal reflux disease)   . History of diverticulitis of colon   . Benign enlargement of prostate   . Hypothyroidism   . History of renal calculi   . Parkinson's disease   . Renal calculi   . CVA (cerebral infarction)     Past Surgical History  Procedure Laterality Date  . Cholecystectomy  02/04/2012    Procedure: LAPAROSCOPIC CHOLECYSTECTOMY WITH INTRAOPERATIVE CHOLANGIOGRAM;  Surgeon: Merrie Roof, MD;  Location: [facility];  Service: General;  Laterality: N/A;  . Lithotripsy      renal calculi  . Tonsillectomy    . Cataract extraction, bilateral      There were no vitals filed for this visit.  Visit Diagnosis:  Lack of coordination  Hypokinesia  Rigidity  Generalized muscle weakness      Subjective Assessment - 04/05/14 1022    Symptoms Pt reports doing PWR! hands   Pertinent History Pt with PD (diagnosed 3 years ago) with 2 CVAs 12/15 followed by home health therapies.  Pt reports PD worse after CVA.  Pt reports increased difficulty with speech, R hand, and walking.   Patient Stated Goals improve  walking, R hand use, speech   Currently in Pain? No/denies                    OT Treatments/Exercises (OP) - 04/05/14 0001    Fine Motor Coordination   Fine Motor Coordination Flipping cards;Dealing card with thumb;Picking up coins  rotating ball   Flipping cards bilateral Ue's min v.c., for RUE   Dealing card with thumb mod difficulty with RUE, min v.c., performed bilaterally   Picking up coins mod difficulty with RUE, min v.c., performed bilaterally   Other Fine Motor Exercises rotating ball in hand,min difficulty with RUE, performed bilaterally.   Other Fine Motor Exercises copying small peg design, seated at tabletop, mod/ max  difficulty with RUE, and min v.c.,occasional min difficulty with LUE   Neurological Re-education Exercises   Reciprocal Movements Arm bike x 6 mins level 1 for conditioning, pt able to maintain greater than 40 RPM   Other Exercises 1 standing at tabletop, functional reaching with right then left UE's with trunk rotation and min v.c. for large amplitude movements, slowing down, minguard for balance                  OT Short Term Goals - 03/21/14 1322    OT SHORT TERM GOAL #1   Title Pt will be independent with updated HEP.--due 04/18/14   Time  4   Period Weeks   Status New   OT SHORT TERM GOAL #2   Title Pt will improve coordination as shown by completing 9-hole peg test in 33min or less with dominant R hand.--due 04/18/14   Baseline 140.93sec   Time 4   Period Weeks   Status New   OT SHORT TERM GOAL #3   Title Pt will be able to write at least 3 sentences with at least 50% legibility.--due 04/18/14   Baseline 25%   Time 4   Period Weeks   Status New   OT SHORT TERM GOAL #4   Title Pt will improve ease with eating as shown by completing PPT#2 in less than 32sec--due 04/18/14   Baseline 40.41   Time 4   Period Weeks   Status New   OT SHORT TERM GOAL #5   Title Pt will improve dressing ability as shown by improving time on PPT#4  by at least 5sec.--due 04/18/14   Baseline 26.71   Time 4   Period Weeks   Status New           OT Long Term Goals - 03/21/14 1333    OT LONG TERM GOAL #1   Title Pt will verbalize understanidng of adaptive strategies for ADLs/IADLs prn to increase ease/safety.--due 05/19/14   Time 8   Period Weeks   Status New   OT LONG TERM GOAL #2   Title Pt will improve coordination as shown by completing 9-hole peg test in 90sec or less with dominant R hand.--due 05/19/14   Time 8   Period Weeks   Status New   OT LONG TERM GOAL #3   Title Pt will be able to write at least 3 sentences with at least 75% legibility.--due 05/19/14   Baseline 25%   Time 8   Period Weeks   Status New   OT LONG TERM GOAL #4   Title Pt will improve ease with eating as shown by completing PPT#2 in less than 25sec--due 05/19/14   Baseline 40.41   Time 8   Period Weeks   Status New   OT LONG TERM GOAL #5   Title Pt will improve BUE functional reaching/coordination for ADLs as shown by improving score on box and blocks test by at least 5.--due 05/19/14   Baseline 39 bilaterally   Time 8   Period Weeks   Status New   OT LONG TERM GOAL #6   Title Pt will perform simple-mod complex meal prep mod I.--due 05/19/14   Time 8   Period Weeks   Status New               Plan - 04/05/14 1059    Clinical Impression Statement Pt was very easily frustrated with fine motor tasks today. Pt requires v.c. to slow down due to impulsiveness.   Clinical Impairments Affecting Rehab Potential speed and timing.   Plan coordiantion, big movementes with slow deliberate effort.   OT Home Exercise Plan reinforce coordiantion HEP   Consulted and Agree with Plan of Care Patient        Problem List Patient Active Problem List   Diagnosis Date Noted  . Discoloration of skin of foot 03/15/2014  . PD (Parkinson's disease) 03/15/2014  . 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 Essential hypertension  03/15/2014  . HLD (hyperlipidemia) 03/15/2014  . Cough   . LV (left ventricular) mural thrombus   . CVA (cerebral infarction) 01/18/2014  .  Imbalance 01/18/2014  . Slurring of speech   . Abnormality of gait 03/12/2012  . Paralysis agitans 03/12/2012  . Acute cholecystitis 02/04/2012  . Hypertension 02/04/2012  . Hyperlipidemia 02/04/2012    [place] 04/05/2014, 11:02 AM Theone Murdoch, OTR/L [phone] Phone: [phone] 11:02 AM 04/05/2014 [facility] [address] [place], [place], [address] Phone: [phone]   Fax:  [phone]

## 2014-04-07 ENCOUNTER — Ambulatory Visit: Payer: Medicare Other | Admitting: Occupational Therapy

## 2014-04-07 ENCOUNTER — Ambulatory Visit: Payer: Medicare Other

## 2014-04-07 ENCOUNTER — Ambulatory Visit: Payer: Medicare Other | Admitting: Physical Therapy

## 2014-04-07 DIAGNOSIS — R4701 Aphasia: Secondary | ICD-10-CM

## 2014-04-07 DIAGNOSIS — R269 Unspecified abnormalities of gait and mobility: Secondary | ICD-10-CM

## 2014-04-07 DIAGNOSIS — Z5189 Encounter for other specified aftercare: Secondary | ICD-10-CM | POA: Diagnosis not present

## 2014-04-07 DIAGNOSIS — R29898 Other symptoms and signs involving the musculoskeletal system: Secondary | ICD-10-CM

## 2014-04-07 DIAGNOSIS — R490 Dysphonia: Secondary | ICD-10-CM

## 2014-04-07 NOTE — Therapy (Signed)
[facility] [address] [place], [place], [address] Phone: [phone]   Fax:  [phone]  Speech Language Pathology Treatment  Patient Details  Name: Kyle Cameron MRN: [id number] Date of Birth: [birth year] Referring Provider:  Seward Carol, MD  Encounter Date: 04/07/2014      End of Session - 04/07/14 1441    Visit Number 6   Number of Visits 16   Date for SLP Re-Evaluation 05/14/14   SLP Start Time 97   SLP Stop Time  1444   SLP Time Calculation (min) 39 min   Activity Tolerance Patient tolerated treatment well      Past Medical History  Diagnosis Date  . Hypertension   . High cholesterol   . Prostate atrophy   . GERD (gastroesophageal reflux disease)   . History of diverticulitis of colon   . Benign enlargement of prostate   . Hypothyroidism   . History of renal calculi   . Parkinson's disease   . Renal calculi   . CVA (cerebral infarction)     Past Surgical History  Procedure Laterality Date  . Cholecystectomy  02/04/2012    Procedure: LAPAROSCOPIC CHOLECYSTECTOMY WITH INTRAOPERATIVE CHOLANGIOGRAM;  Surgeon: Merrie Roof, MD;  Location: [facility];  Service: General;  Laterality: N/A;  . Lithotripsy      renal calculi  . Tonsillectomy    . Cataract extraction, bilateral      There were no vitals filed for this visit.  Visit Diagnosis: Expressive aphasia  Hypokinetic Parkinsonian dysphonia      Subjective Assessment - 04/07/14 1414    Symptoms Pt's simple conversation upon entry to [address] room is functional but disjointed.               ADULT SLP TREATMENT - 04/07/14 1415    General Information   Behavior/Cognition Pleasant mood;Alert;Cooperative   Treatment Provided   Treatment provided Cognitive-Linquistic   Pain Assessment   Pain Assessment No/denies pain   Cognitive-Linquistic Treatment   Treatment focused on Aphasia;Dysarthria   Skilled Treatment Pt read sentences  initially with occasional min-mod A for reduced speech rate and overarticulation, but after task progressed, usual mod cues needed - pt with 30% success by task end. After pt read sentence pt responded with a spontaneous sentence with usual mod A for pausing between words. Semantic paraphasias noted rarely in sentence responses. Rare semantic paraphasias in answers. Pt with linguistic additions as well ("of", "the") rarely when reading clues. These linguistic errors went largely unnoticed by pt.    Assessment / Recommendations / Plan   Plan Continue with current plan of care   Progression Toward Goals   Progression toward goals --  Pt succeess much like last session            SLP Short Term Goals - 04/07/14 1445    SLP SHORT TERM GOAL #1   Title pt will repeat sentences of 10 words 85% success with modified independence   Time 2   Period Weeks   Status On-going   SLP SHORT TERM GOAL #2   Title pt will use strategies for verbal expresion in simple conversation with occasional min-mod A   Time 2   Period Weeks   Status On-going   SLP SHORT TERM GOAL #3   Title pt will gnerate sentence responses 85% success with modified independence   Time 2   Period Weeks   Status On-going  SLP Long Term Goals - 04/07/14 1446    SLP LONG TERM GOAL #1   Title pt will increase loudness to average 70dB in 5 minutes simple conversation   Time 6   Period Weeks   Status On-going   SLP LONG TERM GOAL #2   Title pt will demo compensations for verbal expression in 10 minutes simple conversation with occasional min A   Time 6   Period Weeks   Status On-going   SLP LONG TERM GOAL #3   Title pt will maintain loud /a/ for average 80dB over 4 sessions   Time 6   Period Weeks   Status On-going          Plan - 04/07/14 1442    Clinical Impression Statement Pt's Parkinson's Disease is making pt progress challenging at this time. Cues remain necessary at reading levels for pt to incr  speech intelligibility.   Speech Therapy Frequency 2x / week   Duration --  6 weeks    Treatment/Interventions Language facilitation;Functional tasks;Cueing hierarchy;Patient/family education;SLP instruction and feedback;Compensatory techniques;Internal/external aids   Potential to Achieve Goals Fair   Potential Considerations Co-morbidities        Problem List Patient Active Problem List   Diagnosis Date Noted  . Discoloration of skin of foot 03/15/2014  . PD (Parkinson's disease) 03/15/2014  . 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 Essential hypertension 03/15/2014  . HLD (hyperlipidemia) 03/15/2014  . Cough   . LV (left ventricular) mural thrombus   . CVA (cerebral infarction) 01/18/2014  . Imbalance 01/18/2014  . Slurring of speech   . Abnormality of gait 03/12/2012  . Paralysis agitans 03/12/2012  . Acute cholecystitis 02/04/2012  . Hypertension 02/04/2012  . Hyperlipidemia 02/04/2012    [facility], SLP 04/07/2014, 2:46 PM  [facility] [address] [place], [place], [address] Phone: [phone]   Fax:  [phone]

## 2014-04-08 NOTE — Therapy (Signed)
[facility] [address] [place], [place], [address] Phone: [phone]   Fax:  [phone]  Physical Therapy Treatment  Patient Details  Name: Kyle Cameron MRN: [id number] Date of Birth: 01-Feb-1926 Referring Provider:  Seward Carol, MD  Encounter Date: 04/07/2014      PT End of Session - 04/08/14 1240    Visit Number 7   Number of Visits 17   Date for PT Re-Evaluation 05/15/14   Authorization Type Medicare G-code every 10th visit   PT Start Time 1318   PT Stop Time 1400   PT Time Calculation (min) 42 min   Equipment Utilized During Treatment Gait belt   Activity Tolerance Patient tolerated treatment well      Past Medical History  Diagnosis Date  . Hypertension   . High cholesterol   . Prostate atrophy   . GERD (gastroesophageal reflux disease)   . History of diverticulitis of colon   . Benign enlargement of prostate   . Hypothyroidism   . History of renal calculi   . Parkinson's disease   . Renal calculi   . CVA (cerebral infarction)     Past Surgical History  Procedure Laterality Date  . Cholecystectomy  02/04/2012    Procedure: LAPAROSCOPIC CHOLECYSTECTOMY WITH INTRAOPERATIVE CHOLANGIOGRAM;  Surgeon: Merrie Roof, MD;  Location: [facility];  Service: General;  Laterality: N/A;  . Lithotripsy      renal calculi  . Tonsillectomy    . Cataract extraction, bilateral      There were no vitals filed for this visit.  Visit Diagnosis:  Abnormality of gait  Rigidity      Subjective Assessment - 04/07/14 1315    Symptoms No changes, no falls   Currently in Pain? No/denies                       [facility] Adult PT Treatment/Exercise - 04/08/14 0001    Ambulation/Gait   Ambulation/Gait Yes   Ambulation/Gait Assistance 5: Supervision;4: Min guard   Ambulation/Gait Assistance Details Cues for initiating gait with large amplitude step, cues for initiating turns with outer foot; pt  continues to experience freezing episodes   Ambulation Distance (Feet) 200 Feet  x 2   Assistive device Straight cane   Gait Pattern Decreased step length - right;Decreased step length - left;Narrow base of support;Poor foot clearance - left;Poor foot clearance - right   Ambulation Surface Level;Indoor   High Level Balance   High Level Balance Comments forward step and weightshift, back step and weightshift, side step and weightshift x 10 reps each at counter    Gait activities with tight turns around obstacles, with sidestepping/forward stepping with change of directions around hurdles (zig-zag change of directions).  With quick start/stops,pt requires verbal and visual cues for leading foot to change directions.         Balance Exercises - 04/08/14 1238    Balance Exercises: Standing   Step Over Hurdles / Cones 15 ft, x 3 reps with cane, forward and sidestepping over cones; needs cues for increased step length in side direction   Other Standing Exercises 4-square step activity, 10 reps with cane>without cane, with min assist>min guard with cues for increased step length and height and to regain balance with widened BOS upon stopping; step up-up/down-down x 15 reps at 6"step, then at ramp x 15 reps each leg leading with mini assistance  PT Short Term Goals - 03/16/14 1813    PT SHORT TERM GOAL #1   Title Pt will be independent with HEP for improved transfers, balance, gait.   Time 4   Period Weeks   Status New   PT SHORT TERM GOAL #2   Title Pt will improve 5x sit<>stand to less than or equal to 14.5 seconds with no posterior lean for improved transfer efficiency and safety.   Time 4   Period Weeks   Status New   PT SHORT TERM GOAL #3   Title Pt will improve Functional Gait Assessment score to at least 15/30 for decreased fall risk.   Time 4   Period Weeks   Status New   PT SHORT TERM GOAL #4   Title Pt will improve Timed UP and Go score to less than or equal  to 16 seconds for decreased fall risk.   Time 4   Period Weeks   Status New   PT SHORT TERM GOAL #5   Title Pt will verbalize/demonstrate tips to reduce freezing episodes with gait.   Time 4   Period Weeks   Status New           PT Long Term Goals - 03/16/14 1816    PT LONG TERM GOAL #1   Title Pt will verbalize/demonstrate understanding of fall prevention techniques within home environment. (Target 05/15/14)   Time 8   Period Weeks   Status New   PT LONG TERM GOAL #2   Title perform at least 8 of 10 reps of sit<>stand transfers from <18 inch surfaces, with no posterior lean, for improved efficiency and safety with transfers.   Time 8   Period Weeks   Status New   PT LONG TERM GOAL #3   Title Pt will improve Functional Gait Assessment to at least 20/30 for decreased fall risk.   Time 8   Status New   PT LONG TERM GOAL #4   Title Pt will improve TUG score to less than or equal to 13.5 seconds for decreased fall risk.   Time 8   Period Weeks   Status New   PT LONG TERM GOAL #5   Title Pt will verbalize plans for continued community fitness upon D/C from PT.   Time 8   Period Weeks   Status New               Plan - 04/08/14 1241    Clinical Impression Statement Has strong posterior lean today with step activity off and on ramp and has narrowed base of support/near LOB with 4-square step activity.  Improves during activity, but does not necessarily translate into decreased freezing episodes with gait.   Pt will benefit from skilled therapeutic intervention in order to improve on the following deficits Abnormal gait;Decreased balance;Decreased safety awareness;Decreased coordination;Decreased mobility;Decreased strength;Decreased knowledge of use of DME   Rehab Potential Good   PT Frequency 2x / week   PT Duration 8 weeks   PT Treatment/Interventions ADLs/Self Care Home Management;DME Instruction;Stair training;Gait training;Functional mobility training;Therapeutic  activities;Neuromuscular re-education;Balance training;Therapeutic exercise;Patient/family education   PT Next Visit Plan Continue gait working on decreasing freezing, bilateral heel strike and stride length.   Consulted and Agree with Plan of Care Patient        Problem List Patient Active Problem List   Diagnosis Date Noted  . Discoloration of skin of foot 03/15/2014  . PD (Parkinson's disease) 03/15/2014  . Cerebral infarction due to embolism of  cerebral artery 03/15/2014  . Essential hypertension 03/15/2014  . HLD (hyperlipidemia) 03/15/2014  . Cough   . LV (left ventricular) mural thrombus   . CVA (cerebral infarction) 01/18/2014  . Imbalance 01/18/2014  . Slurring of speech   . Abnormality of gait 03/12/2012  . Paralysis agitans 03/12/2012  . Acute cholecystitis 02/04/2012  . Hypertension 02/04/2012  . Hyperlipidemia 02/04/2012    Mannix Kroeker W. 04/08/2014, 12:43 PM  Mady Haagensen, PT 04/08/2014 12:45 PM Phone: [phone] Fax: [facility] [address] [place], [place], [address] Phone: [phone]   Fax:  [phone]

## 2014-04-12 ENCOUNTER — Encounter: Payer: Self-pay | Admitting: Occupational Therapy

## 2014-04-12 ENCOUNTER — Ambulatory Visit: Payer: Medicare Other | Admitting: Occupational Therapy

## 2014-04-12 ENCOUNTER — Ambulatory Visit: Payer: Medicare Other

## 2014-04-12 ENCOUNTER — Ambulatory Visit: Payer: Medicare Other | Admitting: Physical Therapy

## 2014-04-12 DIAGNOSIS — R490 Dysphonia: Secondary | ICD-10-CM

## 2014-04-12 DIAGNOSIS — R269 Unspecified abnormalities of gait and mobility: Secondary | ICD-10-CM

## 2014-04-12 DIAGNOSIS — R279 Unspecified lack of coordination: Secondary | ICD-10-CM

## 2014-04-12 DIAGNOSIS — Z5189 Encounter for other specified aftercare: Secondary | ICD-10-CM | POA: Diagnosis not present

## 2014-04-12 DIAGNOSIS — R4701 Aphasia: Secondary | ICD-10-CM

## 2014-04-12 DIAGNOSIS — R6889 Other general symptoms and signs: Secondary | ICD-10-CM

## 2014-04-12 DIAGNOSIS — R29898 Other symptoms and signs involving the musculoskeletal system: Secondary | ICD-10-CM

## 2014-04-12 NOTE — Therapy (Signed)
[facility] [address] [place], [place], [address] Phone: [phone]   Fax:  [phone]  Speech Language Pathology Treatment  Patient Details  Name: Kyle Cameron MRN: [id number] Date of Birth: 03-Mar-1926 Referring Provider:  Seward Carol, MD  Encounter Date: 04/12/2014      End of Session - 04/12/14 1012    Visit Number 7   Number of Visits 16   Date for SLP Re-Evaluation 05/14/14   SLP Start Time 0932   SLP Stop Time  1015   SLP Time Calculation (min) 43 min   Activity Tolerance Patient tolerated treatment well      Past Medical History  Diagnosis Date  . Hypertension   . High cholesterol   . Prostate atrophy   . GERD (gastroesophageal reflux disease)   . History of diverticulitis of colon   . Benign enlargement of prostate   . Hypothyroidism   . History of renal calculi   . Parkinson's disease   . Renal calculi   . CVA (cerebral infarction)     Past Surgical History  Procedure Laterality Date  . Cholecystectomy  02/04/2012    Procedure: LAPAROSCOPIC CHOLECYSTECTOMY WITH INTRAOPERATIVE CHOLANGIOGRAM;  Surgeon: Merrie Roof, MD;  Location: [facility];  Service: General;  Laterality: N/A;  . Lithotripsy      renal calculi  . Tonsillectomy    . Cataract extraction, bilateral      There were no vitals filed for this visit.  Visit Diagnosis: Expressive aphasia  Hypokinetic Parkinsonian dysphonia      Subjective Assessment - 04/12/14 0937    Symptoms Pt with fall ("minor") since last visit. SLP to notify PT.               ADULT SLP TREATMENT - 04/12/14 0937    General Information   Behavior/Cognition Alert;Cooperative;Pleasant mood   Treatment Provided   Treatment provided Cognitive-Linquistic   Pain Assessment   Pain Assessment No/denies pain   Cognitive-Linquistic Treatment   Treatment focused on Aphasia;Dysarthria   Skilled Treatment Conversation re: news stories req'd SLP  min-mod A usually for reduced rate. Pt read sentences with min A rarely for reduced rate. Paraphasias noted, as well as minimal groping behavior.  Sentence responses conveyed with occasional min A for rate reduction; second attempt was improved 50% of the time. By task end pt with 45% success -pt with fatigue likely.     Assessment / Recommendations / Plan   Plan Continue with current plan of care   Progression Toward Goals   Progression toward goals --  pt progressing like last week            SLP Short Term Goals - 04/12/14 1013    SLP SHORT TERM GOAL #1   Title pt will repeat sentences of 10 words 80% success with rare min A   Time 1   Period Weeks   Status Revised   SLP SHORT TERM GOAL #2   Title pt will use strategies for verbal expresion in simple conversation with occasional min-mod A   Time 1   Period Weeks   Status On-going   SLP SHORT TERM GOAL #3   Title pt will generate sentence responses 80% success with rare min A   Time 1   Period Weeks   Status Revised          SLP Long Term Goals - 04/12/14 1014    SLP LONG TERM GOAL #1   Title pt  will increase loudness to average 70dB in 5 minutes simple conversation   Time 5   Period Weeks   Status On-going   SLP LONG TERM GOAL #2   Title pt will demo compensations for verbal expression in 10 minutes simple conversation with occasional min A   Time 5   Period Weeks   Status On-going   SLP LONG TERM GOAL #3   Title pt will maintain loud /a/ for average 80dB over 4 sessions   Time 5   Period Weeks   Status On-going          Plan - 04/12/14 1013    Clinical Impression Statement Pt's Parkinson's Disease is making pt progress challenging at this time. Cues remain necessary at reading levels for pt to incr speech intelligibility.   Speech Therapy Frequency 2x / week   Duration --  5 weeks   Treatment/Interventions Language facilitation;Functional tasks;Cueing hierarchy;Patient/family education;SLP instruction  and feedback;Compensatory techniques;Internal/external aids   Potential to Achieve Goals Fair   Potential Considerations Co-morbidities;Severity of impairments        Problem List Patient Active Problem List   Diagnosis Date Noted  . Discoloration of skin of foot 03/15/2014  . PD (Parkinson's disease) 03/15/2014  . 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 Essential hypertension 03/15/2014  . HLD (hyperlipidemia) 03/15/2014  . Cough   . LV (left ventricular) mural thrombus   . CVA (cerebral infarction) 01/18/2014  . Imbalance 01/18/2014  . Slurring of speech   . Abnormality of gait 03/12/2012  . Paralysis agitans 03/12/2012  . Acute cholecystitis 02/04/2012  . Hypertension 02/04/2012  . Hyperlipidemia 02/04/2012    Kayah Hecker, slp 04/12/2014, 10:15 AM  [facility] [address] [place], [place], [address] Phone: [phone]   Fax:  [phone]

## 2014-04-12 NOTE — Patient Instructions (Signed)
  Please complete the assigned speech therapy homework prior to your next session.  

## 2014-04-12 NOTE — Therapy (Signed)
[facility] [address] [place], [place], [address] Phone: [phone]   Fax:  [phone]  Occupational Therapy Treatment  Patient Details  Name: Kyle Cameron MRN: [id number] Date of Birth: 1926-07-27 Referring Provider:  Seward Carol, MD  Encounter Date: 04/12/2014      OT End of Session - 04/12/14 1023    Visit Number 5   Number of Visits 17   Date for OT Re-Evaluation 05/19/14   Authorization Type 1Medicare, 2[employer], G-code needed   Authorization - Visit Number 5   Authorization - Number of Visits 10   OT Start Time 1019   OT Stop Time 1100   OT Time Calculation (min) 41 min   Activity Tolerance Patient tolerated treatment well   Behavior During Therapy Impulsive      Past Medical History  Diagnosis Date  . Hypertension   . High cholesterol   . Prostate atrophy   . GERD (gastroesophageal reflux disease)   . History of diverticulitis of colon   . Benign enlargement of prostate   . Hypothyroidism   . History of renal calculi   . Parkinson's disease   . Renal calculi   . CVA (cerebral infarction)     Past Surgical History  Procedure Laterality Date  . Cholecystectomy  02/04/2012    Procedure: LAPAROSCOPIC CHOLECYSTECTOMY WITH INTRAOPERATIVE CHOLANGIOGRAM;  Surgeon: Merrie Roof, MD;  Location: [facility];  Service: General;  Laterality: N/A;  . Lithotripsy      renal calculi  . Tonsillectomy    . Cataract extraction, bilateral      There were no vitals filed for this visit.  Visit Diagnosis:  Lack of coordination  Hypokinesia  Rigidity      Subjective Assessment - 04/12/14 1030    Symptoms Pt reports making bananna bread (fist thing that he cooked) and that it took significantly increased time.  Pt reports that he drove yesterday as well.   Pertinent History Pt with PD (diagnosed 3 years ago) with 2 CVAs 12/15 followed by home health therapies.  Pt reports PD worse after CVA.  Pt reports  increased difficulty with speech, R hand, and walking.   Patient Stated Goals improve walking, R hand use, speech   Currently in Pain? No/denies                    OT Treatments/Exercises (OP) - 04/12/14 0001    ADLs   Eating Using foam grip on spoon for simulated eating with min drops and min v.c. to slow down (incr drops with speed)   Fine Motor Coordination   Fine Motor Coordination Small Pegboard   Small Pegboard min-mod difficulty to copy design, increased difficulty when frustrated.  Removing using PWR! hands with mod-max cueing due to timing and impulsivity with RUE   Flipping cards --  min-mod verbal/tactile for big movements and timing R hand   Other Fine Motor Exercises with R hand picking up various small objects of different size/shape with min-mod difficulty   Functional Reaching Activities   Mid Level to place washers on poles with opening fingers around outside for larger washers with min v.c. and then 2point pinch for smaller objects with min-mod difficulty/drops                  OT Short Term Goals - 03/21/14 1322    OT SHORT TERM GOAL #1   Title Pt will be independent with updated HEP.--due 04/18/14   Time  4   Period Weeks   Status New   OT SHORT TERM GOAL #2   Title Pt will improve coordination as shown by completing 9-hole peg test in 76min or less with dominant R hand.--due 04/18/14   Baseline 140.93sec   Time 4   Period Weeks   Status New   OT SHORT TERM GOAL #3   Title Pt will be able to write at least 3 sentences with at least 50% legibility.--due 04/18/14   Baseline 25%   Time 4   Period Weeks   Status New   OT SHORT TERM GOAL #4   Title Pt will improve ease with eating as shown by completing PPT#2 in less than 32sec--due 04/18/14   Baseline 40.41   Time 4   Period Weeks   Status New   OT SHORT TERM GOAL #5   Title Pt will improve dressing ability as shown by improving time on PPT#4 by at least 5sec.--due 04/18/14   Baseline 26.71    Time 4   Period Weeks   Status New           OT Long Term Goals - 03/21/14 1333    OT LONG TERM GOAL #1   Title Pt will verbalize understanidng of adaptive strategies for ADLs/IADLs prn to increase ease/safety.--due 05/19/14   Time 8   Period Weeks   Status New   OT LONG TERM GOAL #2   Title Pt will improve coordination as shown by completing 9-hole peg test in 90sec or less with dominant R hand.--due 05/19/14   Time 8   Period Weeks   Status New   OT LONG TERM GOAL #3   Title Pt will be able to write at least 3 sentences with at least 75% legibility.--due 05/19/14   Baseline 25%   Time 8   Period Weeks   Status New   OT LONG TERM GOAL #4   Title Pt will improve ease with eating as shown by completing PPT#2 in less than 25sec--due 05/19/14   Baseline 40.41   Time 8   Period Weeks   Status New   OT LONG TERM GOAL #5   Title Pt will improve BUE functional reaching/coordination for ADLs as shown by improving score on box and blocks test by at least 5.--due 05/19/14   Baseline 39 bilaterally   Time 8   Period Weeks   Status New   OT LONG TERM GOAL #6   Title Pt will perform simple-mod complex meal prep mod I.--due 05/19/14   Time 8   Period Weeks   Status New               Plan - 04/12/14 1030    Clinical Impression Statement Difficulty with timing and frustration affects coordination and R hand functional use.   Plan check STGs next week, coordination, big movements with slow deliberate effort   OT Home Exercise Plan HEP issued:  coordination        Problem List Patient Active Problem List   Diagnosis Date Noted  . Discoloration of skin of foot 03/15/2014  . PD (Parkinson's disease) 03/15/2014  . 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 Essential hypertension 03/15/2014  . HLD (hyperlipidemia) 03/15/2014  . Cough   . LV (left ventricular) mural thrombus   . CVA (cerebral infarction) 01/18/2014  . Imbalance 01/18/2014  .  Slurring of speech   . Abnormality of gait 03/12/2012  . Paralysis agitans 03/12/2012  .  Acute cholecystitis 02/04/2012  . Hypertension 02/04/2012  . Hyperlipidemia 02/04/2012    [facility] 04/12/2014, 1:27 PM  [facility] [address] [place], [place], [address] Phone: [phone]   Fax:  [place], OTR/L 04/12/2014 1:27 PM

## 2014-04-13 ENCOUNTER — Ambulatory Visit: Payer: Medicare Other

## 2014-04-13 ENCOUNTER — Encounter: Payer: Self-pay | Admitting: Physical Therapy

## 2014-04-13 ENCOUNTER — Ambulatory Visit: Payer: Medicare Other | Admitting: Occupational Therapy

## 2014-04-13 DIAGNOSIS — Z7409 Other reduced mobility: Secondary | ICD-10-CM

## 2014-04-13 DIAGNOSIS — G20 Parkinson's disease: Secondary | ICD-10-CM

## 2014-04-13 DIAGNOSIS — R269 Unspecified abnormalities of gait and mobility: Secondary | ICD-10-CM

## 2014-04-13 DIAGNOSIS — Z5189 Encounter for other specified aftercare: Secondary | ICD-10-CM | POA: Diagnosis not present

## 2014-04-13 DIAGNOSIS — R4701 Aphasia: Secondary | ICD-10-CM

## 2014-04-13 DIAGNOSIS — R29898 Other symptoms and signs involving the musculoskeletal system: Secondary | ICD-10-CM

## 2014-04-13 DIAGNOSIS — R279 Unspecified lack of coordination: Secondary | ICD-10-CM

## 2014-04-13 DIAGNOSIS — M6281 Muscle weakness (generalized): Secondary | ICD-10-CM

## 2014-04-13 DIAGNOSIS — R490 Dysphonia: Secondary | ICD-10-CM

## 2014-04-13 NOTE — Therapy (Signed)
[facility] [address] [place], [place], [address] Phone: [phone]   Fax:  [phone]  Speech Language Pathology Treatment  Patient Details  Name: Kyle Cameron MRN: [id number] Date of Birth: [birth year] Referring Provider:  Seward Carol, MD  Encounter Date: 04/13/2014      End of Session - 04/13/14 1439    SLP Start Time 1404   SLP Stop Time  1950   SLP Time Calculation (min) 32 min   Activity Tolerance Patient limited by fatigue      Past Medical History  Diagnosis Date  . Hypertension   . High cholesterol   . Prostate atrophy   . GERD (gastroesophageal reflux disease)   . History of diverticulitis of colon   . Benign enlargement of prostate   . Hypothyroidism   . History of renal calculi   . Parkinson's disease   . Renal calculi   . CVA (cerebral infarction)     Past Surgical History  Procedure Laterality Date  . Cholecystectomy  02/04/2012    Procedure: LAPAROSCOPIC CHOLECYSTECTOMY WITH INTRAOPERATIVE CHOLANGIOGRAM;  Surgeon: Merrie Roof, MD;  Location: [facility];  Service: General;  Laterality: N/A;  . Lithotripsy      renal calculi  . Tonsillectomy    . Cataract extraction, bilateral      There were no vitals filed for this visit.  Visit Diagnosis: Expressive aphasia  Hypokinetic Parkinsonian dysphonia      Subjective Assessment - 04/13/14 1410    Symptoms "I'm exhausted."               ADULT SLP TREATMENT - 04/13/14 1412    General Information   Behavior/Cognition Alert;Cooperative;Pleasant mood   Treatment Provided   Treatment provided Cognitive-Linquistic   Pain Assessment   Pain Assessment No/denies pain   Cognitive-Linquistic Treatment   Treatment focused on Aphasia;Dysarthria   Skilled Treatment Pt read paragraphs with external cues (red lines) with more success than without, however after 20 minutes of this activity pt's success remained unchanged.   Assessment / Recommendations / Plan   Plan Continue with current plan of care   Progression Toward Goals   Progression toward goals --  progress unchanged from previous session            SLP Short Term Goals - 04/13/14 1429    SLP SHORT TERM GOAL #1   Title pt will repeat sentences of 10 words 80% success with rare min A   Time 1   Period Weeks   Status Revised   SLP SHORT TERM GOAL #2   Title pt will use strategies for verbal expresion in simple conversation with occasional min-mod A   Time 1   Period Weeks   Status On-going   SLP SHORT TERM GOAL #3   Title pt will generate sentence responses 80% success with rare min A   Time 1   Period Weeks   Status Revised          SLP Long Term Goals - 04/12/14 1014    SLP LONG TERM GOAL #1   Title pt will increase loudness to average 70dB in 5 minutes simple conversation   Time 5   Period Weeks   Status On-going   SLP LONG TERM GOAL #2   Title pt will demo compensations for verbal expression in 10 minutes simple conversation with occasional min A   Time 5   Period Weeks   Status On-going   SLP LONG TERM  GOAL #3   Title pt will maintain loud /a/ for average 80dB over 4 sessions   Time 5   Period Weeks   Status On-going          Plan - 04/13/14 1434    Clinical Impression Statement Pt's Parkinson's Disease is making pt progress challenging at this time. Cues remain necessary at reading levels for pt to incr speech intelligibility. Pt complained of incr'd fatigue today.   Speech Therapy Frequency 2x / week   Duration --  5 weeks   Treatment/Interventions Language facilitation;Functional tasks;Cueing hierarchy;Patient/family education;SLP instruction and feedback;Compensatory techniques;Internal/external aids   Potential to Achieve Goals Fair   Potential Considerations Co-morbidities;Severity of impairments        Problem List Patient Active Problem List   Diagnosis Date Noted  . Discoloration of skin of foot  03/15/2014  . PD (Parkinson's disease) 03/15/2014  . 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 Essential hypertension 03/15/2014  . HLD (hyperlipidemia) 03/15/2014  . Cough   . LV (left ventricular) mural thrombus   . CVA (cerebral infarction) 01/18/2014  . Imbalance 01/18/2014  . Slurring of speech   . Abnormality of gait 03/12/2012  . Paralysis agitans 03/12/2012  . Acute cholecystitis 02/04/2012  . Hypertension 02/04/2012  . Hyperlipidemia 02/04/2012    Garald Balding, SLP 04/13/2014, 2:40 PM  [facility] [address] [place], [place], [address] Phone: [phone]   Fax:  [phone]

## 2014-04-13 NOTE — Therapy (Signed)
[facility] [address] [place], [place], [address] Phone: [phone]   Fax:  [phone]  Occupational Therapy Treatment  Patient Details  Name: Kyle Cameron MRN: [id number] Date of Birth: [birth year] Referring Provider:  Seward Carol, MD  Encounter Date: 04/13/2014      OT End of Session - 04/13/14 1330    Visit Number 6   Number of Visits 17   Date for OT Re-Evaluation 05/19/14   Authorization Type 1Medicare, 2AARP, G-code needed   Authorization - Visit Number 6   Authorization - Number of Visits 10   OT Start Time 1320   OT Stop Time 1400   OT Time Calculation (min) 40 min   Activity Tolerance Patient tolerated treatment well   Behavior During Therapy [facility] for tasks assessed/performed      Past Medical History  Diagnosis Date  . Hypertension   . High cholesterol   . Prostate atrophy   . GERD (gastroesophageal reflux disease)   . History of diverticulitis of colon   . Benign enlargement of prostate   . Hypothyroidism   . History of renal calculi   . Parkinson's disease   . Renal calculi   . CVA (cerebral infarction)     Past Surgical History  Procedure Laterality Date  . Cholecystectomy  02/04/2012    Procedure: LAPAROSCOPIC CHOLECYSTECTOMY WITH INTRAOPERATIVE CHOLANGIOGRAM;  Surgeon: Merrie Roof, MD;  Location: [facility];  Service: General;  Laterality: N/A;  . Lithotripsy      renal calculi  . Tonsillectomy    . Cataract extraction, bilateral      There were no vitals filed for this visit.  Visit Diagnosis:  Lack of coordination  Generalized muscle weakness  Decreased functional mobility and endurance  Rigidity      Subjective Assessment - 04/13/14 1324    Pertinent History Pt with PD (diagnosed 3 years ago) with 2 CVAs 12/15 followed by home health therapies.  Pt reports PD worse after CVA.  Pt reports increased difficulty with speech, R hand, and walking.   Patient Stated Goals  improve walking, R hand use, speech   Currently in Pain? No/denies        Treatment: Tracing and handwriting activities with mod v.c. For slow deliberate and larger handwriting. Pt continues to demonstrate micrographia at the end of sentences. He also demonstrates difficulty with spelling, likely due to aphasia. Standing for dynamic reaching and fine motor task to place large pegs in vertical pegboard, min-mod difficulty and several LOB. Pt. requires v.c to slow down during activities, and  he demonstrates difficulty with maintaining balance while focusing on a fine motor task.                      OT Short Term Goals - 03/21/14 1322    OT SHORT TERM GOAL #1   Title Pt will be independent with updated HEP.--due 04/18/14   Time 4   Period Weeks   Status New   OT SHORT TERM GOAL #2   Title Pt will improve coordination as shown by completing 9-hole peg test in 28min or less with dominant R hand.--due 04/18/14   Baseline 140.93sec   Time 4   Period Weeks   Status New   OT SHORT TERM GOAL #3   Title Pt will be able to write at least 3 sentences with at least 50% legibility.--due 04/18/14   Baseline 25%   Time 4   Period Weeks  Status New   OT SHORT TERM GOAL #4   Title Pt will improve ease with eating as shown by completing PPT#2 in less than 32sec--due 04/18/14   Baseline 40.41   Time 4   Period Weeks   Status New   OT SHORT TERM GOAL #5   Title Pt will improve dressing ability as shown by improving time on PPT#4 by at least 5sec.--due 04/18/14   Baseline 26.71   Time 4   Period Weeks   Status New           OT Long Term Goals - 03/21/14 1333    OT LONG TERM GOAL #1   Title Pt will verbalize understanidng of adaptive strategies for ADLs/IADLs prn to increase ease/safety.--due 05/19/14   Time 8   Period Weeks   Status New   OT LONG TERM GOAL #2   Title Pt will improve coordination as shown by completing 9-hole peg test in 90sec or less with dominant R  hand.--due 05/19/14   Time 8   Period Weeks   Status New   OT LONG TERM GOAL #3   Title Pt will be able to write at least 3 sentences with at least 75% legibility.--due 05/19/14   Baseline 25%   Time 8   Period Weeks   Status New   OT LONG TERM GOAL #4   Title Pt will improve ease with eating as shown by completing PPT#2 in less than 25sec--due 05/19/14   Baseline 40.41   Time 8   Period Weeks   Status New   OT LONG TERM GOAL #5   Title Pt will improve BUE functional reaching/coordination for ADLs as shown by improving score on box and blocks test by at least 5.--due 05/19/14   Baseline 39 bilaterally   Time 8   Period Weeks   Status New   OT LONG TERM GOAL #6   Title Pt will perform simple-mod complex meal prep mod I.--due 05/19/14   Time 8   Period Weeks   Status New               Plan - 04/13/14 1330    Clinical Impression Statement Pt reports driving himself today.Therapist discussed concerns regarding safety and recommendation that pt pursue MD clearance for driving.   Pt will benefit from skilled therapeutic intervention in order to improve on the following deficits (Retired) Decreased balance;Decreased knowledge of use of DME;Impaired UE functional use;Impaired perceived functional ability;Decreased strength;Decreased mobility;Decreased activity tolerance;Impaired tone;Impaired sensation;Decreased safety awareness;Decreased endurance;Decreased coordination   Clinical Impairments Affecting Rehab Potential speed and timing.   OT Frequency 2x / week   OT Duration 8 weeks   OT Treatment/Interventions Self-care/ADL training;Moist Heat;DME and/or AE instruction;Fluidtherapy;Splinting;Patient/family education;Balance training;Therapeutic exercises;Therapeutic exercise;Therapeutic activities;Functional Mobility Training;Neuromuscular education;Cryotherapy;Energy conservation;Manual Therapy;Electrical Stimulation   Plan check STG's next week, coordination, big movements with slow  effort.   OT Home Exercise Plan HEP issued:  coordination   Consulted and Agree with Plan of Care Patient        Problem List Patient Active Problem List   Diagnosis Date Noted  . Discoloration of skin of foot 03/15/2014  . PD (Parkinson's disease) 03/15/2014  . 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 Essential hypertension 03/15/2014  . HLD (hyperlipidemia) 03/15/2014  . Cough   . LV (left ventricular) mural thrombus   . CVA (cerebral infarction) 01/18/2014  . Imbalance 01/18/2014  . Slurring of speech   . Abnormality of gait 03/12/2012  .  Paralysis agitans 03/12/2012  . Acute cholecystitis 02/04/2012  . Hypertension 02/04/2012  . Hyperlipidemia 02/04/2012    RINE,KATHRYN 04/13/2014, 1:35 PM Theone Murdoch, OTR/L [phone] Phone: [phone] 1:38 PM 03/23/2016[facility] [address] [place], [place], [address] Phone: [phone]   Fax:  [phone]

## 2014-04-13 NOTE — Therapy (Signed)
[facility] [address] [place], [place], [address] Phone: [phone]   Fax:  [phone]  Physical Therapy Treatment  Patient Details  Name: Kyle Cameron MRN: [id number] Date of Birth: 1927-01-18 Referring Provider:  Renford Dills, MD  Encounter Date: 04/12/2014      PT End of Session - 04/13/14 1207    Visit Number 8   Number of Visits 17   Date for PT Re-Evaluation 05/15/14   Authorization Type Medicare G-code every 10th visit   PT Start Time 1103   PT Stop Time 1144   PT Time Calculation (min) 41 min   Equipment Utilized During Treatment Gait belt   Activity Tolerance Patient tolerated treatment well   Behavior During Therapy Impulsive      Past Medical History  Diagnosis Date  . Hypertension   . High cholesterol   . Prostate atrophy   . GERD (gastroesophageal reflux disease)   . History of diverticulitis of colon   . Benign enlargement of prostate   . Hypothyroidism   . History of renal calculi   . Parkinson's disease   . Renal calculi   . CVA (cerebral infarction)     Past Surgical History  Procedure Laterality Date  . Cholecystectomy  02/04/2012    Procedure: LAPAROSCOPIC CHOLECYSTECTOMY WITH INTRAOPERATIVE CHOLANGIOGRAM;  Surgeon: Robyne Askew, MD;  Location: [facility] OR;  Service: General;  Laterality: N/A;  . Lithotripsy      renal calculi  . Tonsillectomy    . Cataract extraction, bilateral      There were no vitals filed for this visit.  Visit Diagnosis:  Abnormality of gait      Subjective Assessment - 04/13/14 1200    Symptoms Denies medical changes.  Did fall forward while stooping to pick up an object but caught self with hands.  Reports it took him 1 hour to make bread which would have taken him 10 minutes prior.   Pertinent History CVA 12/26/13 and 01/18/14, Parkinson's disease   Patient Stated Goals Pt's goal for therapy is to help with the initiation of walking.   Currently in Pain? No/denies            [facility]    Functional Gait  Assessment   Gait assessed  Yes   Gait Level Surface Walks 20 ft in less than 7 sec but greater than 5.5 sec, uses assistive device, slower speed, mild gait deviations, or deviates 6-10 in outside of the 12 in walkway width.   Change in Gait Speed Able to change speed, demonstrates mild gait deviations, deviates 6-10 in outside of the 12 in walkway width, or no gait deviations, unable to achieve a major change in velocity, or uses a change in velocity, or uses an assistive device.   Gait with Horizontal Head Turns Performs head turns smoothly with no change in gait. Deviates no more than 6 in outside 12 in walkway width   Gait with Vertical Head Turns Performs head turns with no change in gait. Deviates no more than 6 in outside 12 in walkway width.   Gait and Pivot Turn Cannot turn safely, requires assistance to turn and stop.   Step Over Obstacle Is able to step over one shoe box (4.5 in total height) but must slow down and adjust steps to clear box safely. May require verbal cueing.   Gait with Narrow Base of Support Ambulates less than 4 steps heel to toe or cannot perform without assistance.  Gait with Eyes Closed Walks 20 ft, slow speed, abnormal gait pattern, evidence for imbalance, deviates 10-15 in outside 12 in walkway width. Requires more than 9 sec to ambulate 20 ft.   Ambulating Backwards Cannot walk 20 ft without assistance, severe gait deviations or imbalance, deviates greater than 15 in outside 12 in walkway width or will not attempt task.   Steps Alternating feet, must use rail.   Total Score 14                   [facility] Adult PT Treatment/Exercise - 04/13/14 0001    Transfers   Sit to Stand Five times sit to stand  12.84 seconds without posterior lean   Five time sit to stand comments  without posterior lean   Ambulation/Gait   Ambulation/Gait Yes   Ambulation/Gait Assistance 5: Supervision    Ambulation/Gait Assistance Details significant freezing/festinating with initiating gait and turns; cues to stop and reinitiate with big step   Ambulation Distance (Feet) 220 Feet  three times   Assistive device Straight cane   Gait Pattern Decreased step length - right;Decreased step length - left;Narrow base of support;Poor foot clearance - left;Poor foot clearance - right   Ambulation Surface Level;Indoor   Timed Up and Go Test   TUG Normal TUG   Normal TUG (seconds) 15.07  no device;freezes with turns                  PT Short Term Goals - 04/13/14 1214    PT SHORT TERM GOAL #1   Title Pt will be independent with HEP for improved transfers, balance, gait.   Time 4   Period Weeks   Status New   PT SHORT TERM GOAL #2   Title Pt will improve 5x sit<>stand to less than or equal to 14.5 seconds with no posterior lean for improved transfer efficiency and safety.   Status Achieved   PT SHORT TERM GOAL #3   Title Pt will improve Functional Gait Assessment score to at least 15/30 for decreased fall risk.   Status Not Met   PT SHORT TERM GOAL #4   Title Pt will improve Timed UP and Go score to less than or equal to 16 seconds for decreased fall risk.   Status Achieved   PT SHORT TERM GOAL #5   Title Pt will verbalize/demonstrate tips to reduce freezing episodes with gait.   Time 4   Period Weeks   Status New           PT Long Term Goals - 03/16/14 1816    PT LONG TERM GOAL #1   Title Pt will verbalize/demonstrate understanding of fall prevention techniques within home environment. (Target 05/15/14)   Time 8   Period Weeks   Status New   PT LONG TERM GOAL #2   Title perform at least 8 of 10 reps of sit<>stand transfers from <18 inch surfaces, with no posterior lean, for improved efficiency and safety with transfers.   Time 8   Period Weeks   Status New   PT LONG TERM GOAL #3   Title Pt will improve Functional Gait Assessment to at least 20/30 for decreased fall  risk.   Time 8   Status New   PT LONG TERM GOAL #4   Title Pt will improve TUG score to less than or equal to 13.5 seconds for decreased fall risk.   Time 8   Period Weeks   Status New   PT  LONG TERM GOAL #5   Title Pt will verbalize plans for continued community fitness upon D/C from PT.   Time 8   Period Weeks   Status New               Plan - 04/13/14 1208    Clinical Impression Statement Pt had significant difficulty with cognitive tasks-perseverating on his schedule and printed calendar pages.  Continues with freezing episodes with inititaion and turns.  Pt met STG 2 & 4.  STG 3 unmet (scored 14/30).   Pt will benefit from skilled therapeutic intervention in order to improve on the following deficits Abnormal gait;Decreased balance;Decreased safety awareness;Decreased coordination;Decreased mobility;Decreased strength;Decreased knowledge of use of DME   Rehab Potential Good   PT Frequency 2x / week   PT Duration 8 weeks   PT Treatment/Interventions ADLs/Self Care Home Management;DME Instruction;Stair training;Gait training;Functional mobility training;Therapeutic activities;Neuromuscular re-education;Balance training;Therapeutic exercise;Patient/family education   PT Next Visit Plan Finish checking STG's.  Continue gait working on decreasing freezing, bilateral heel strike and stride length.        Problem List Patient Active Problem List   Diagnosis Date Noted  . Discoloration of skin of foot 03/15/2014  . PD (Parkinson's disease) 03/15/2014  . 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 Essential hypertension 03/15/2014  . HLD (hyperlipidemia) 03/15/2014  . Cough   . LV (left ventricular) mural thrombus   . CVA (cerebral infarction) 01/18/2014  . Imbalance 01/18/2014  . Slurring of speech   . Abnormality of gait 03/12/2012  . Paralysis agitans 03/12/2012  . Acute cholecystitis 02/04/2012  . Hypertension 02/04/2012  . Hyperlipidemia  02/04/2012    [name] 04/13/2014, 12:15 PM  [facility] [address] [place], [place], [address] Phone: [phone]   Fax:  [place], [place] 04/13/2014 12:15 PM Phone: [phone] Fax: [phone]

## 2014-04-13 NOTE — Therapy (Signed)
[facility] [address] [place], [place], [address] Phone: [phone]   Fax:  [phone]  Physical Therapy Treatment  Patient Details  Name: Kyle Cameron MRN: [id number] Date of Birth: 03/23/1926 Referring Provider:  Seward Carol, MD  Encounter Date: 04/13/2014      PT End of Session - 04/13/14 1311    Visit Number 9   Number of Visits 17   Date for PT Re-Evaluation 05/15/14   Authorization Type Medicare G-code every 10th visit   PT Start Time 1232   PT Stop Time 1315   PT Time Calculation (min) 43 min      Past Medical History  Diagnosis Date  . Hypertension   . High cholesterol   . Prostate atrophy   . GERD (gastroesophageal reflux disease)   . History of diverticulitis of colon   . Benign enlargement of prostate   . Hypothyroidism   . History of renal calculi   . Parkinson's disease   . Renal calculi   . CVA (cerebral infarction)     Past Surgical History  Procedure Laterality Date  . Cholecystectomy  02/04/2012    Procedure: LAPAROSCOPIC CHOLECYSTECTOMY WITH INTRAOPERATIVE CHOLANGIOGRAM;  Surgeon: Merrie Roof, MD;  Location: [facility];  Service: General;  Laterality: N/A;  . Lithotripsy      renal calculi  . Tonsillectomy    . Cataract extraction, bilateral      There were no vitals filed for this visit.  Visit Diagnosis:  Lack of coordination  Abnormality of gait  Generalized muscle weakness  Decreased functional mobility and endurance      Subjective Assessment - 04/13/14 1235    Symptoms Pt reports he drove for the first time sinc ehis stroke today.   Currently in Pain? No/denies     Therapist recommended that pt seek physician clearance before returning to driving.  Neuro re-ed: Pt has not been performing his home exercise program, and requires verbal cues and demonstration to correctly perform PWR sitting and standing exercises.   PWR! Basic 4 moves in sitting:  Performed multiple incorrect repetitions due to impulsivity but once pt was corrected, he was able to perform 10x to each side of each of the following: PWR! Up for improved posture PWR! Rock for weight shift PWR! Twist for trunk rotaiton PWR! Step for transition   Also performed PWR! Basice 4 moves in standing in the same manner as the sitting moves, with verbal cues and demo required. 10x for each of the four exercises, at the counter with UE support.   Gait training >600' with single point cane with emphasis on heel strike, increased arm swing, and turning and stopping, then turning and continuing walking on command.   Therex SCI Fit all extremities x6 minutes level 2.5 with goal to push to 76 steps per minute-this was 10% greater than his self selected pace. Performed to improve endurance.              PT Short Term Goals - 04/13/14 1236    PT SHORT TERM GOAL #1   Title Pt will be independent with HEP for improved transfers, balance, gait.   Time 4   Period Weeks   Status Not Met   PT SHORT TERM GOAL #2   Title Pt will improve 5x sit<>stand to less than or equal to 14.5 seconds with no posterior lean for improved transfer efficiency and safety.   Status Achieved   PT SHORT TERM GOAL #3  Title Pt will improve Functional Gait Assessment score to at least 15/30 for decreased fall risk.   Status Not Met   PT SHORT TERM GOAL #4   Title Pt will improve Timed UP and Go score to less than or equal to 16 seconds for decreased fall risk.   Status Achieved   PT SHORT TERM GOAL #5   Title Pt will verbalize/demonstrate tips to reduce freezing episodes with gait.   Time 4   Period Weeks   Status Partially Met  verbalizes these but doesn't demonstrate them           PT Long Term Goals - 03/16/14 1816    PT LONG TERM GOAL #1   Title Pt will verbalize/demonstrate understanding of fall prevention techniques within home environment. (Target 05/15/14)   Time 8   Period Weeks    Status New   PT LONG TERM GOAL #2   Title perform at least 8 of 10 reps of sit<>stand transfers from <18 inch surfaces, with no posterior lean, for improved efficiency and safety with transfers.   Time 8   Period Weeks   Status New   PT LONG TERM GOAL #3   Title Pt will improve Functional Gait Assessment to at least 20/30 for decreased fall risk.   Time 8   Status New   PT LONG TERM GOAL #4   Title Pt will improve TUG score to less than or equal to 13.5 seconds for decreased fall risk.   Time 8   Period Weeks   Status New   PT LONG TERM GOAL #5   Title Pt will verbalize plans for continued community fitness upon D/C from PT.   Time 8   Period Weeks   Status New               Plan - 04/13/14 1311    Clinical Impression Statement Pt demonstrated good heel strike for 95% of gait training today. Continues to have freezing episodes, and while he is able to verbalize freezing strategies, pt reports that they "don't work" however, he doesn't appear to attempt them when freezing episodes occur and requires verbal cues to stop, stand tall, weight shift and take a large step. This is an effective strategy when he is cued to perform it.    PT Next Visit Plan *G code* Continue with weight shifting and large amplitude activities and working on decreasing freezing        Problem List Patient Active Problem List   Diagnosis Date Noted  . Discoloration of skin of foot 03/15/2014  . PD (Parkinson's disease) 03/15/2014  . 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 Essential hypertension 03/15/2014  . HLD (hyperlipidemia) 03/15/2014  . Cough   . LV (left ventricular) mural thrombus   . CVA (cerebral infarction) 01/18/2014  . Imbalance 01/18/2014  . Slurring of speech   . Abnormality of gait 03/12/2012  . Paralysis agitans 03/12/2012  . Acute cholecystitis 02/04/2012  . Hypertension 02/04/2012  . Hyperlipidemia 02/04/2012   Delrae Sawyers,  PT,DPT,NCS 04/13/2014 1:35 PM Phone [phone] FAX [phone]         [facility] [address] [place], [place], [address] Phone: [phone]   Fax:  [phone]

## 2014-04-19 ENCOUNTER — Encounter: Payer: Self-pay | Admitting: Occupational Therapy

## 2014-04-19 ENCOUNTER — Ambulatory Visit: Payer: Medicare Other | Admitting: Occupational Therapy

## 2014-04-19 ENCOUNTER — Ambulatory Visit: Payer: Medicare Other

## 2014-04-19 ENCOUNTER — Ambulatory Visit: Payer: Medicare Other | Admitting: Physical Therapy

## 2014-04-19 DIAGNOSIS — R6889 Other general symptoms and signs: Secondary | ICD-10-CM

## 2014-04-19 DIAGNOSIS — R29898 Other symptoms and signs involving the musculoskeletal system: Secondary | ICD-10-CM

## 2014-04-19 DIAGNOSIS — R269 Unspecified abnormalities of gait and mobility: Secondary | ICD-10-CM

## 2014-04-19 DIAGNOSIS — Z5189 Encounter for other specified aftercare: Secondary | ICD-10-CM | POA: Diagnosis not present

## 2014-04-19 DIAGNOSIS — R279 Unspecified lack of coordination: Secondary | ICD-10-CM

## 2014-04-19 DIAGNOSIS — R4701 Aphasia: Secondary | ICD-10-CM

## 2014-04-19 DIAGNOSIS — R490 Dysphonia: Secondary | ICD-10-CM

## 2014-04-19 DIAGNOSIS — M6281 Muscle weakness (generalized): Secondary | ICD-10-CM

## 2014-04-19 NOTE — Therapy (Signed)
[facility] [address] [place], [place], [address] Phone: [phone]   Fax:  [phone]  Speech Language Pathology Treatment  Patient Details  Name: Kyle Cameron MRN: [id number] Date of Birth: [birth year] Referring Provider:  Seward Carol, MD  Encounter Date: 04/19/2014      End of Session - 04/19/14 1145    Visit Number 9   Number of Visits 16   Date for SLP Re-Evaluation 05/14/14   SLP Start Time 1103   SLP Stop Time  1143   SLP Time Calculation (min) 40 min   Activity Tolerance Patient tolerated treatment well      Past Medical History  Diagnosis Date  . Hypertension   . High cholesterol   . Prostate atrophy   . GERD (gastroesophageal reflux disease)   . History of diverticulitis of colon   . Benign enlargement of prostate   . Hypothyroidism   . History of renal calculi   . Parkinson's disease   . Renal calculi   . CVA (cerebral infarction)     Past Surgical History  Procedure Laterality Date  . Cholecystectomy  02/04/2012    Procedure: LAPAROSCOPIC CHOLECYSTECTOMY WITH INTRAOPERATIVE CHOLANGIOGRAM;  Surgeon: Merrie Roof, MD;  Location: [facility];  Service: General;  Laterality: N/A;  . Lithotripsy      renal calculi  . Tonsillectomy    . Cataract extraction, bilateral      There were no vitals filed for this visit.  Visit Diagnosis: Expressive aphasia  Hypokinetic Parkinsonian dysphonia      Subjective Assessment - 04/19/14 1107    Symptoms Pt entered ST room with fast rate of speech requiring SLP to ask pt to reduce rate for 85% of utterances in order for SLP to understand pt.               ADULT SLP TREATMENT - 04/19/14 1108    General Information   Behavior/Cognition Alert;Cooperative;Pleasant mood   Treatment Provided   Treatment provided Cognitive-Linquistic   Pain Assessment   Pain Assessment No/denies pain   Cognitive-Linquistic Treatment   Treatment focused on  Aphasia;Dysarthria   Skilled Treatment Pt described pictures with reduced rate upon entry to ST room; 25% success. Pt read 3 sentence selections,with cue to "talk like you think you are talking too slowly." SLP with 90% comprehension using this compensation. In spontaneous sentence tasks using this compensation, pt was intelligible 85% of the time.   Assessment / Recommendations / Plan   Plan Continue with current plan of care   Progression Toward Goals   Progression toward goals Progressing toward goals  "Speak like you are talking annoyingly slow"          SLP Education - 04/19/14 1145    Education provided Yes   Education Details compensations for speech intelligibility ("Talk like you are speaking annoyingly slow.")   Person(s) Educated Patient   Methods Explanation;Demonstration;Verbal cues   Comprehension Verbalized understanding;Returned demonstration;Verbal cues required;Need further instruction          SLP Short Term Goals - 04/19/14 1148    SLP SHORT TERM GOAL #1   Title pt will repeat sentences of 10 words 80% success with rare min A   Time 1   Period Weeks   Status Not Met   SLP SHORT TERM GOAL #2   Title pt will use strategies for verbal expresion in simple conversation with occasional min-mod A   Time 1   Period Weeks  Status Not Met   SLP SHORT TERM GOAL #3   Title pt will generate sentence responses 80% success with rare min A   Time 1   Period Weeks   Status Not Met          SLP Long Term Goals - 04/19/14 1148    SLP LONG TERM GOAL #1   Title pt will increase intelligibility to 95% in 3 minutes simple conversation with rare min A   Time 4   Period Weeks   Status Revised   SLP LONG TERM GOAL #2   Title pt will demo compensations for verbal expression in 7 minutes simple conversation with occasional min A   Time 4   Period Weeks   Status Revised   SLP LONG TERM GOAL #3   Title pt will maintain loud /a/ for average 75dB over 4 sessions   Time 5    Period Weeks   Status Revised          Plan - 04/19/14 1147    Clinical Impression Statement Pt's focus on speaking at such a rate that seems too slow is the most helpful strategy to date. Pt had best intelligibility thus far in treatment today with reading and sentence tasks.   Speech Therapy Frequency 2x / week   Duration 4 weeks   Treatment/Interventions Language facilitation;Functional tasks;Cueing hierarchy;Patient/family education;SLP instruction and feedback;Compensatory techniques;Internal/external aids   Potential to Achieve Goals Fair   Potential Considerations Co-morbidities;Severity of impairments        Problem List Patient Active Problem List   Diagnosis Date Noted  . Discoloration of skin of foot 03/15/2014  . PD (Parkinson's disease) 03/15/2014  . 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 Essential hypertension 03/15/2014  . HLD (hyperlipidemia) 03/15/2014  . Cough   . LV (left ventricular) mural thrombus   . CVA (cerebral infarction) 01/18/2014  . Imbalance 01/18/2014  . Slurring of speech   . Abnormality of gait 03/12/2012  . Paralysis agitans 03/12/2012  . Acute cholecystitis 02/04/2012  . Hypertension 02/04/2012  . Hyperlipidemia 02/04/2012    Garald Balding, SLP 04/19/2014, 11:50 AM  [facility] [address] [place], [place], [address] Phone: [phone]   Fax:  [phone]

## 2014-04-19 NOTE — Patient Instructions (Signed)
  Please complete the assigned speech therapy homework prior to your next session.  

## 2014-04-19 NOTE — Therapy (Signed)
[facility] [address] [place], [place], [address] Phone: [phone]   Fax:  [phone]  Occupational Therapy Treatment  Patient Details  Name: Kyle Cameron MRN: [id number] Date of Birth: 1926/05/23 Referring Provider:  Seward Carol, MD  Encounter Date: 04/19/2014      OT End of Session - 04/19/14 0952    Visit Number 7   Number of Visits 17   Date for OT Re-Evaluation 05/19/14   Authorization Type 1Medicare, 2AARP, G-code needed   Authorization - Visit Number 7   Authorization - Number of Visits 10   OT Start Time 0933   OT Stop Time 1015   OT Time Calculation (min) 42 min   Activity Tolerance Patient tolerated treatment well   Behavior During Therapy [facility] for tasks assessed/performed      Past Medical History  Diagnosis Date  . Hypertension   . High cholesterol   . Prostate atrophy   . GERD (gastroesophageal reflux disease)   . History of diverticulitis of colon   . Benign enlargement of prostate   . Hypothyroidism   . History of renal calculi   . Parkinson's disease   . Renal calculi   . CVA (cerebral infarction)     Past Surgical History  Procedure Laterality Date  . Cholecystectomy  02/04/2012    Procedure: LAPAROSCOPIC CHOLECYSTECTOMY WITH INTRAOPERATIVE CHOLANGIOGRAM;  Surgeon: Merrie Roof, MD;  Location: [facility];  Service: General;  Laterality: N/A;  . Lithotripsy      renal calculi  . Tonsillectomy    . Cataract extraction, bilateral      There were no vitals filed for this visit.  Visit Diagnosis:  Lack of coordination  Generalized muscle weakness  Hypokinesia  Rigidity      Subjective Assessment - 04/19/14 0939    Symptoms "Went to the beach this weekend"  [place] while on his knees 2x.  No injuries.  Pt reports that PCP told him not to drive or fly  (next visit in a month).   Pertinent History Pt with PD (diagnosed 3 years ago) with 2 CVAs 12/15 followed by home health  therapies.  Pt reports PD worse after CVA.  Pt reports increased difficulty with speech, R hand, and walking.   Patient Stated Goals improve walking, R hand use, speech   Currently in Pain? No/denies                    OT Treatments/Exercises (OP) - 04/19/14 0001    Fine Motor Coordination   Small Pegboard mod difficulty to copy design with R hand    Grooved pegs unable   Neurological Re-education Exercises   Reciprocal Movements Arm bike x6 min with min cues to maintain >40rpms, pt maintained 44-46rpms   Functional Reaching Activities   Mid Level in sitting/satnding, to place clothespins with 1-8lb resistance on vertical pole for increased strength/coordination, 2-3 drops           [facility]) - 04/19/14 1046    PWR! exercises Moves in quadraped   Comments modified quadraped basic 4 x10 each with min cues               OT Short Term Goals - 04/19/14 0957    OT SHORT TERM GOAL #1   Title Pt will be independent with updated HEP.--due 04/18/14   Time 4   Period Weeks   Status On-going  04/19/14:  continues to need cues for timing and needs  updates   OT SHORT TERM GOAL #2   Title Pt will improve coordination as shown by completing 9-hole peg test in 35min or less with dominant R hand.--due 04/18/14   Baseline 140.93sec   Time 4   Period Weeks   Status Achieved  04/19/14:  95.34 sec with multiple drops   OT SHORT TERM GOAL #3   Title Pt will be able to write at least 3 sentences with at least 50% legibility.--due 04/18/14   Baseline 25%   Time 4   Period Weeks   Status New   OT SHORT TERM GOAL #4   Title Pt will improve ease with eating as shown by completing PPT#2 in less than 32sec--due 04/18/14   Baseline 40.41   Time 4   Period Weeks   Status Achieved  04/19/14:  14.84sec   OT SHORT TERM GOAL #5   Title Pt will improve dressing ability as shown by improving time on PPT#4 by at least 5sec.--due 04/18/14   Baseline 26.71   Time 4   Period Weeks    Status New           OT Long Term Goals - 03/21/14 1333    OT LONG TERM GOAL #1   Title Pt will verbalize understanidng of adaptive strategies for ADLs/IADLs prn to increase ease/safety.--due 05/19/14   Time 8   Period Weeks   Status New   OT LONG TERM GOAL #2   Title Pt will improve coordination as shown by completing 9-hole peg test in 90sec or less with dominant R hand.--due 05/19/14   Time 8   Period Weeks   Status New   OT LONG TERM GOAL #3   Title Pt will be able to write at least 3 sentences with at least 75% legibility.--due 05/19/14   Baseline 25%   Time 8   Period Weeks   Status New   OT LONG TERM GOAL #4   Title Pt will improve ease with eating as shown by completing PPT#2 in less than 25sec--due 05/19/14   Baseline 40.41   Time 8   Period Weeks   Status New   OT LONG TERM GOAL #5   Title Pt will improve BUE functional reaching/coordination for ADLs as shown by improving score on box and blocks test by at least 5.--due 05/19/14   Baseline 39 bilaterally   Time 8   Period Weeks   Status New   OT LONG TERM GOAL #6   Title Pt will perform simple-mod complex meal prep mod I.--due 05/19/14   Time 8   Period Weeks   Status New               Plan - 04/19/14 0955    Clinical Impression Statement Pt reports that MD does not want him to drive at this time.  Pt demo increased difficulty today with timing of movement, coordination, freezing.  Pt with significant difficulty with follow through for freezing strategies and  timing difficulties for coordination.   Plan check remaining STGs, PWR! moves in modified quadraped and issue for HEP   OT Home Exercise Plan HEP issued:  coordination        Problem List Patient Active Problem List   Diagnosis Date Noted  . Discoloration of skin of foot 03/15/2014  . PD (Parkinson's disease) 03/15/2014  . 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 Essential hypertension 03/15/2014  . HLD  (hyperlipidemia) 03/15/2014  . Cough   .  LV (left ventricular) mural thrombus   . CVA (cerebral infarction) 01/18/2014  . Imbalance 01/18/2014  . Slurring of speech   . Abnormality of gait 03/12/2012  . Paralysis agitans 03/12/2012  . Acute cholecystitis 02/04/2012  . Hypertension 02/04/2012  . Hyperlipidemia 02/04/2012    [facility] 04/19/2014, 12:35 PM  [facility] [address] [place], [place], [address] Phone: [phone]   Fax:  [place], OTR/L 04/19/2014 12:35 PM

## 2014-04-20 NOTE — Therapy (Signed)
[facility] [address] [place], [place], [address] Phone: [phone]   Fax:  [phone]  Physical Therapy Treatment  Patient Details  Name: Kyle Cameron MRN: [id number] Date of Birth: November 14, 1926 Referring Provider:  Seward Carol, MD  Encounter Date: 04/19/2014      PT End of Session - 04/20/14 1700    Visit Number 10   Number of Visits 17   Date for PT Re-Evaluation 05/15/14   Authorization Type Medicare G-code every 10th visit   PT Start Time 1021   PT Stop Time 1100   PT Time Calculation (min) 39 min   Equipment Utilized During Treatment Gait belt   Activity Tolerance Patient tolerated treatment well   Behavior During Therapy [facility] for tasks assessed/performed      Past Medical History  Diagnosis Date  . Hypertension   . High cholesterol   . Prostate atrophy   . GERD (gastroesophageal reflux disease)   . History of diverticulitis of colon   . Benign enlargement of prostate   . Hypothyroidism   . History of renal calculi   . Parkinson's disease   . Renal calculi   . CVA (cerebral infarction)     Past Surgical History  Procedure Laterality Date  . Cholecystectomy  02/04/2012    Procedure: LAPAROSCOPIC CHOLECYSTECTOMY WITH INTRAOPERATIVE CHOLANGIOGRAM;  Surgeon: Merrie Roof, MD;  Location: [facility];  Service: General;  Laterality: N/A;  . Lithotripsy      renal calculi  . Tonsillectomy    . Cataract extraction, bilateral      There were no vitals filed for this visit.  Visit Diagnosis:  Rigidity  Abnormality of gait  Lack of coordination      Subjective Assessment - 04/19/14 1022    Symptoms Had a fall to my knees when trying to pick up something-didn't get hurt   Currently in Pain? No/denies              High level balance activities performed at counter for UE support.   Gait:  Gait activities at initiation of session x 400 ft using single point cane, with cues for  initial posture, step length and foot clearance.  Dynamic gait activities negotiating over and around obstacles (riverstones, balance disks, foam) using cane, multiple reps, with supervison and cues for increased step length and height.  Pt tends to take smaller steps, with circumduction around the obstacles.   Multiple reps of weightshifting turn practice during obstacle negotiation activity-pt requires max verbal cues and tactile cues to slow down and weightshift to turn to decrease freezing and festinating during turns.         [facility] Adult PT Treatment/Exercise - 04/19/14 1028    High Level Balance   High Level Balance Activities Side stepping;Backward walking;Marching forwards;Marching backwards;Figure 8 turns  Marching in place x 10; U-turns with cane   High Level Balance Comments Four square step activity for directional changes, with cues for large step length/height and widened BOS   Knee/Hip Exercises: Aerobic   Stationary Bike SciFit, Level 2.5 all 4 extremities x 8 minutes, with cues to keep RPM >76-80 for improved intensity                  PT Short Term Goals - 04/13/14 1236    PT SHORT TERM GOAL #1   Title Pt will be independent with HEP for improved transfers, balance, gait.   Time 4   Period Weeks   Status  Not Met   PT SHORT TERM GOAL #2   Title Pt will improve 5x sit<>stand to less than or equal to 14.5 seconds with no posterior lean for improved transfer efficiency and safety.   Status Achieved   PT SHORT TERM GOAL #3   Title Pt will improve Functional Gait Assessment score to at least 15/30 for decreased fall risk.   Status Not Met   PT SHORT TERM GOAL #4   Title Pt will improve Timed UP and Go score to less than or equal to 16 seconds for decreased fall risk.   Status Achieved   PT SHORT TERM GOAL #5   Title Pt will verbalize/demonstrate tips to reduce freezing episodes with gait.   Time 4   Period Weeks   Status Partially Met  verbalizes these but  doesn't demonstrate them           PT Long Term Goals - 03/16/14 1816    PT LONG TERM GOAL #1   Title Pt will verbalize/demonstrate understanding of fall prevention techniques within home environment. ([place] 05/15/14)   Time 8   Period Weeks   Status New   PT LONG TERM GOAL #2   Title perform at least 8 of 10 reps of sit<>stand transfers from <18 inch surfaces, with no posterior lean, for improved efficiency and safety with transfers.   Time 8   Period Weeks   Status New   PT LONG TERM GOAL #3   Title Pt will improve Functional Gait Assessment to at least 20/30 for decreased fall risk.   Time 8   Status New   PT LONG TERM GOAL #4   Title Pt will improve TUG score to less than or equal to 13.5 seconds for decreased fall risk.   Time 8   Period Weeks   Status New   PT LONG TERM GOAL #5   Title Pt will verbalize plans for continued community fitness upon D/C from PT.   Time 8   Period Weeks   Status New               Plan - May 01, 2014 1700    Clinical Impression Statement Pt has a difficult time with pacing of movements and tends to speed up lower extrmeity movement during turns and obstacle negotiation, leading to freezing episodes despite verbal and demo cues from therapist.  Pt will continue to benefit from further skilled PT to address balance and giat.   Pt will benefit from skilled therapeutic intervention in order to improve on the following deficits Abnormal gait;Decreased balance;Decreased safety awareness;Decreased coordination;Decreased mobility;Decreased strength;Decreased knowledge of use of DME   Rehab Potential Good   PT Frequency 2x / week   PT Duration 8 weeks   PT Treatment/Interventions ADLs/Self Care Home Management;DME Instruction;Stair training;Gait training;Functional mobility training;Therapeutic activities;Neuromuscular re-education;Balance training;Therapeutic exercise;Patient/family education   PT Next Visit Plan weightshifting and large  amplitude activities to decrease freezing   Consulted and Agree with Plan of Care Patient          G-Codes - 2014-05-01 1702    Functional Assessment Tool Used Functional Gait Assessment 14/30; 5x sit<>stand 12.84 seocnds; TUG 15.07 seconds   Functional Limitation Mobility: Walking and moving around   Mobility: Walking and Moving Around Current Status (H2122) At least 40 percent but less than 60 percent impaired, limited or restricted   Mobility: Walking and Moving Around Goal Status (Q8250) At least 20 percent but less than 40 percent impaired, limited or restricted  Problem List Patient Active Problem List   Diagnosis Date Noted  . Discoloration of skin of foot 03/15/2014  . PD (Parkinson's disease) 03/15/2014  . 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 Essential hypertension 03/15/2014  . HLD (hyperlipidemia) 03/15/2014  . Cough   . LV (left ventricular) mural thrombus   . CVA (cerebral infarction) 01/18/2014  . Imbalance 01/18/2014  . Slurring of speech   . Abnormality of gait 03/12/2012  . Paralysis agitans 03/12/2012  . Acute cholecystitis 02/04/2012  . Hypertension 02/04/2012  . Hyperlipidemia 02/04/2012    Cynthie Garmon W. 04/20/2014, 5:05 PM  Mady Haagensen, PT 04/20/2014 5:06 PM Phone: [phone] Fax: [facility] [address] [place], [place], [address] Phone: [phone]   Fax:  [phone]

## 2014-04-21 ENCOUNTER — Ambulatory Visit: Payer: Medicare Other | Admitting: Physical Therapy

## 2014-04-21 ENCOUNTER — Ambulatory Visit: Payer: Medicare Other | Admitting: Occupational Therapy

## 2014-04-21 ENCOUNTER — Ambulatory Visit: Payer: Medicare Other

## 2014-04-21 DIAGNOSIS — R279 Unspecified lack of coordination: Secondary | ICD-10-CM

## 2014-04-21 DIAGNOSIS — Z5189 Encounter for other specified aftercare: Secondary | ICD-10-CM | POA: Diagnosis not present

## 2014-04-21 DIAGNOSIS — R6889 Other general symptoms and signs: Secondary | ICD-10-CM

## 2014-04-21 DIAGNOSIS — R4701 Aphasia: Secondary | ICD-10-CM

## 2014-04-21 DIAGNOSIS — G20 Parkinson's disease: Secondary | ICD-10-CM

## 2014-04-21 DIAGNOSIS — M6281 Muscle weakness (generalized): Secondary | ICD-10-CM

## 2014-04-21 DIAGNOSIS — Z7409 Other reduced mobility: Secondary | ICD-10-CM

## 2014-04-21 DIAGNOSIS — R29898 Other symptoms and signs involving the musculoskeletal system: Secondary | ICD-10-CM

## 2014-04-21 DIAGNOSIS — R490 Dysphonia: Secondary | ICD-10-CM

## 2014-04-21 DIAGNOSIS — R269 Unspecified abnormalities of gait and mobility: Secondary | ICD-10-CM

## 2014-04-21 NOTE — Therapy (Signed)
[facility] [address] [place], [place], [address] Phone: [phone]   Fax:  [phone]  Speech Language Pathology Treatment  Patient Details  Name: Kyle Cameron MRN: [id number] Date of Birth: 10-03-1926 Referring Provider:  Seward Carol, MD  Encounter Date: 04/21/2014      End of Session - 04/21/14 1127    Visit Number 10   Number of Visits 16   Date for SLP Re-Evaluation 05/14/14   SLP Start Time 1105   SLP Stop Time  1145   SLP Time Calculation (min) 40 min   Activity Tolerance Patient tolerated treatment well      Past Medical History  Diagnosis Date  . Hypertension   . High cholesterol   . Prostate atrophy   . GERD (gastroesophageal reflux disease)   . History of diverticulitis of colon   . Benign enlargement of prostate   . Hypothyroidism   . History of renal calculi   . Parkinson's disease   . Renal calculi   . CVA (cerebral infarction)     Past Surgical History  Procedure Laterality Date  . Cholecystectomy  02/04/2012    Procedure: LAPAROSCOPIC CHOLECYSTECTOMY WITH INTRAOPERATIVE CHOLANGIOGRAM;  Surgeon: Merrie Roof, MD;  Location: [facility];  Service: General;  Laterality: N/A;  . Lithotripsy      renal calculi  . Tonsillectomy    . Cataract extraction, bilateral      There were no vitals filed for this visit.  Visit Diagnosis: Expressive aphasia  Hypokinetic Parkinsonian dysphonia      Subjective Assessment - 04/21/14 1110    Symptoms Pt rec'd call just prior to ST that a friend had died. Upon return call, pt was approx 85% intelligible.               ADULT SLP TREATMENT - 04/21/14 1106    General Information   Behavior/Cognition Alert;Cooperative;Pleasant mood   Treatment Provided   Treatment provided Cognitive-Linquistic   Pain Assessment   Pain Assessment No/denies pain   Cognitive-Linquistic Treatment   Treatment focused on Aphasia;Dysarthria   Skilled  Treatment Pt entered ST room and reviewed homework without compensations. SLP needed to usually cue pt for reduced rate. SLP reieterated to pt that speaking at a fast rate is actually not the most efficient way for pt to talk, but instead, REDUCES efficiency.  After this re-education, SLP cont to need to cue pt usually to reduce rate.   Assessment / Recommendations / Plan   Plan Continue with current plan of care   Progression Toward Goals   Progression toward goals --  Pt with usual mod cues necessary in simple sentence tasks.          SLP Education - 04/21/14 1140    Education provided Yes   Education Details compensations for increasing speech intelligibility (same as Tuesday 04-19-14)   Person(s) Educated Patient   Methods Explanation;Demonstration;Tactile cues   Comprehension Verbalized understanding;Returned demonstration;Verbal cues required          SLP Short Term Goals - 04/21/14 1143    SLP SHORT TERM GOAL #1   Title pt will repeat sentences of 10 words 80% success with rare min A   Status Not Met   SLP SHORT TERM GOAL #2   Title pt will use strategies for verbal expresion in simple conversation with occasional min-mod A   Status Not Met   SLP SHORT TERM GOAL #3   Title pt will generate sentence responses 80%  success with rare min A   Status Not Met          SLP Long Term Goals - 04-27-14 1143    SLP LONG TERM GOAL #1   Title pt will increase intelligibility to 95% in 3 minutes simple conversation with rare min A   Time 4   Period Weeks   Status Revised   SLP LONG TERM GOAL #2   Title pt will demo compensations for verbal expression in 7 minutes simple conversation with occasional min A   Time 4   Period Weeks   Status Revised   SLP LONG TERM GOAL #3   Title pt will maintain loud /a/ for average 75dB over 4 sessions   Time 4   Period Weeks   Status Revised          Plan - 04/27/2014 1200    Clinical Impression Statement Pt did not generate same level  of success that he did yesterday in emerging independence with slower rate of speech to improve intelligibility. Pt to continue for approx 2 more weeks and if little/no progress, d/c very likely.   Speech Therapy Frequency 2x / week   Duration 4 weeks   Treatment/Interventions Language facilitation;Functional tasks;Cueing hierarchy;Patient/family education;SLP instruction and feedback;Compensatory techniques;Internal/external aids   Potential to Achieve Goals Fair   Potential Considerations Co-morbidities;Severity of impairments          G-Codes - April 27, 2014 1141    Functional Assessment Tool Used noms 5   Functional Limitations Spoken language expressive   Spoken Language Expression Current Status [phone]) At least 20 percent but less than 40 percent impaired, limited or restricted   Spoken Language Expression Goal Status (N2258) At least 20 percent but less than 40 percent impaired, limited or restricted      Problem List Patient Active Problem List   Diagnosis Date Noted  . Discoloration of skin of foot 03/15/2014  . PD (Parkinson's disease) 03/15/2014  . 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 Essential hypertension 03/15/2014  . HLD (hyperlipidemia) 03/15/2014  . Cough   . LV (left ventricular) mural thrombus   . CVA (cerebral infarction) 01/18/2014  . Imbalance 01/18/2014  . Slurring of speech   . Abnormality of gait 03/12/2012  . Paralysis agitans 03/12/2012  . Acute cholecystitis 02/04/2012  . Hypertension 02/04/2012  . Hyperlipidemia 02/04/2012    Garald Balding, SLP 04-27-2014, 12:03 PM  [facility] [address] [place], [place], [address] Phone: [phone]   Fax:  [phone]

## 2014-04-21 NOTE — Therapy (Signed)
[facility] [address] [place], [place], [address] Phone: [phone]   Fax:  [phone]  Occupational Therapy Treatment  Patient Details  Name: Kyle Cameron MRN: [id number] Date of Birth: 1926-02-09 Referring Provider:  Seward Carol, MD  Encounter Date: 04/21/2014      OT End of Session - 04/21/14 1101    Visit Number 8   Number of Visits 17   Date for OT Re-Evaluation 05/19/14   Authorization Type 1Medicare, 2AARP, G-code needed   Authorization - Visit Number 8   Authorization - Number of Visits 10   OT Start Time 1019   OT Stop Time 1100   OT Time Calculation (min) 41 min      Past Medical History  Diagnosis Date  . Hypertension   . High cholesterol   . Prostate atrophy   . GERD (gastroesophageal reflux disease)   . History of diverticulitis of colon   . Benign enlargement of prostate   . Hypothyroidism   . History of renal calculi   . Parkinson's disease   . Renal calculi   . CVA (cerebral infarction)     Past Surgical History  Procedure Laterality Date  . Cholecystectomy  02/04/2012    Procedure: LAPAROSCOPIC CHOLECYSTECTOMY WITH INTRAOPERATIVE CHOLANGIOGRAM;  Surgeon: Merrie Roof, MD;  Location: [facility];  Service: General;  Laterality: N/A;  . Lithotripsy      renal calculi  . Tonsillectomy    . Cataract extraction, bilateral      There were no vitals filed for this visit.  Visit Diagnosis:  Lack of coordination  Generalized muscle weakness  Hypokinesia  Decreased functional mobility and endurance  Rigidity      Subjective Assessment - 04/21/14 1101    Pertinent History Pt with PD (diagnosed 3 years ago) with 2 CVAs 12/15 followed by home health therapies.  Pt reports PD worse after CVA.  Pt reports increased difficulty with speech, R hand, and walking.   Patient Stated Goals improve walking, R hand use, speech   Currently in Pain? No/denies                     OT Treatments/Exercises (OP) - 04/21/14 0001    Neurological Re-education Exercises   Reciprocal Movements Arm bike x6 min with min cues to maintain >40rpms, pt maintained 44-46rpms   Functional Reaching Activities   Mid Level  standing to place washers on rings with 2 pt pinch, miin-mod difficulty, min v.c for posture and PWR! hands           [facility]) - 04/21/14 1114    PWR! exercises Moves in [place]! Up 10   PWR! Rock 10   PWR! Twist 10   PWR! Step 10   Comments min v.c. intially, then pt returned demonstration, issued as HEP     Therapist checked progress towards remaining goals. Therapist reviewed hand writing strategies and techniques for donning jacket. Pt returned difficulty of technique for donning gown with min v.c. Pt wrote 2-3 sentences with 25-50% legibility.          OT Short Term Goals - 04/21/14 1110    OT SHORT TERM GOAL #1   Title Pt will be independent with updated HEP.--due 04/18/14   Time 4   Period Weeks   Status On-going  04/19/14:  continues to need cues for timing and needs updates   OT SHORT TERM GOAL #2   Title Pt will  improve coordination as shown by completing 9-hole peg test in 44min or less with dominant R hand.--due 04/18/14   Baseline 140.93sec   Time 4   Period Weeks   Status Achieved  04/19/14:  95.34 sec with multiple drops   OT SHORT TERM GOAL #3   Title Pt will be able to write at least 3 sentences with at least 50% legibility.--due 04/18/14   Baseline 25%   Time 4   Period Weeks   Status On-going   OT SHORT TERM GOAL #4   Title Pt will improve ease with eating as shown by completing PPT#2 in less than 32sec--due 04/18/14   Baseline 40.41   Time 4   Period Weeks   Status Achieved  04/19/14:  14.84sec   OT SHORT TERM GOAL #5   Title Pt will improve dressing ability as shown by improving time on PPT#4 by at least 5sec.--due 04/18/14   Baseline 04/21/14-26.52 secs, not met   Time 4   Period Weeks   Status On-going            OT Long Term Goals - 03/21/14 1333    OT LONG TERM GOAL #1   Title Pt will verbalize understanidng of adaptive strategies for ADLs/IADLs prn to increase ease/safety.--due 05/19/14   Time 8   Period Weeks   Status New   OT LONG TERM GOAL #2   Title Pt will improve coordination as shown by completing 9-hole peg test in 90sec or less with dominant R hand.--due 05/19/14   Time 8   Period Weeks   Status New   OT LONG TERM GOAL #3   Title Pt will be able to write at least 3 sentences with at least 75% legibility.--due 05/19/14   Baseline 25%   Time 8   Period Weeks   Status New   OT LONG TERM GOAL #4   Title Pt will improve ease with eating as shown by completing PPT#2 in less than 25sec--due 05/19/14   Baseline 40.41   Time 8   Period Weeks   Status New   OT LONG TERM GOAL #5   Title Pt will improve BUE functional reaching/coordination for ADLs as shown by improving score on box and blocks test by at least 5.--due 05/19/14   Baseline 39 bilaterally   Time 8   Period Weeks   Status New   OT LONG TERM GOAL #6   Title Pt will perform simple-mod complex meal prep mod I.--due 05/19/14   Time 8   Period Weeks   Status New               Plan - 04/21/14 1112    Clinical Impression Statement Pt is progressing towards goals yet is limited by difficulty with timing of movment, freezing and coordination.   Plan Reinforce PWR! moves modified quadraped   OT Home Exercise Plan HEP issued:  coordination, PWR! modified quadraped issued 04/21/14   Consulted and Agree with Plan of Care Patient        Problem List Patient Active Problem List   Diagnosis Date Noted  . Discoloration of skin of foot 03/15/2014  . PD (Parkinson's disease) 03/15/2014  . 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 Essential hypertension 03/15/2014  . HLD (hyperlipidemia) 03/15/2014  . Cough   . LV (left ventricular) mural thrombus   . CVA (cerebral infarction)  01/18/2014  . Imbalance 01/18/2014  . Slurring of speech   . Abnormality of gait  03/12/2012  . Paralysis agitans 03/12/2012  . Acute cholecystitis 02/04/2012  . Hypertension 02/04/2012  . Hyperlipidemia 02/04/2012    Bonny Egger 04/21/2014, 11:19 AM Theone Murdoch, OTR/L [phone] Phone: [phone] 11:19 AM 03/31/2016[facility] [address] [place], [place], [address] Phone: [phone]   Fax:  [phone]

## 2014-04-21 NOTE — Patient Instructions (Signed)
  Complete homework as directed in order afford best opportunity for making new habit of reduced rate of speech.

## 2014-04-21 NOTE — Therapy (Signed)
[facility] [address] [place], [place], [address] Phone: [phone]   Fax:  [phone]  Physical Therapy Treatment  Patient Details  Name: Kyle Cameron MRN: [id number] Date of Birth: 1926-01-31 Referring Provider:  Seward Carol, MD  Encounter Date: 04/21/2014      PT End of Session - 04/21/14 1433    Visit Number 11   Number of Visits 17   Date for PT Re-Evaluation 05/15/14   Authorization Type Medicare G-code every 10th visit   PT Start Time 0935   PT Stop Time 1018   PT Time Calculation (min) 43 min   Activity Tolerance Patient tolerated treatment well   Behavior During Therapy [facility] for tasks assessed/performed      Past Medical History  Diagnosis Date  . Hypertension   . High cholesterol   . Prostate atrophy   . GERD (gastroesophageal reflux disease)   . History of diverticulitis of colon   . Benign enlargement of prostate   . Hypothyroidism   . History of renal calculi   . Parkinson's disease   . Renal calculi   . CVA (cerebral infarction)     Past Surgical History  Procedure Laterality Date  . Cholecystectomy  02/04/2012    Procedure: LAPAROSCOPIC CHOLECYSTECTOMY WITH INTRAOPERATIVE CHOLANGIOGRAM;  Surgeon: Merrie Roof, MD;  Location: [facility];  Service: General;  Laterality: N/A;  . Lithotripsy      renal calculi  . Tonsillectomy    . Cataract extraction, bilateral      There were no vitals filed for this visit.  Visit Diagnosis:  Abnormality of gait  Rigidity      Subjective Assessment - 04/21/14 0937    Symptoms No pain, no falls; feel rushed today and feel like I'm less steady on my feet   Currently in Pain? No/denies                       [facility] Adult PT Treatment/Exercise - 04/21/14 0944    High Level Balance   High Level Balance Comments Forward, backward, side step and weightshift over obstacles 2 sets x 10 reps  In parallel bars with bilateral UE  support       Progression of activities to address increased step length, improved foot clearance and improved turns:  In parallel bars>on solid ground using floor ladder>cues for marching or weightshifting turns; followed by use of riverstone to increase BOS for improved slow, deliberate marching with turning, multiple reps of practice.  Marching in place with riverstone between feet, 3 sets x 10.  Gradually removed floor ladder, then riverstones, to practice gait and turns without props, with minimal verbal cues to visualize props for improved slow, deliberate gait and decreased freezing with turns.  Sit<>stand with short distance gait x 4 reps, simulating getting up quickly to get telephone in home; cues provided for widened base of support and large amplitude initial step length.         PT Education - 04/21/14 1433    Education provided Yes   Education Details Pacing, slow, deliberate movement patterns with visualizing riverstone between feet for widened BOS; marching turns   Person(s) Educated Patient   Methods Explanation;Demonstration;Tactile cues   Comprehension Verbalized understanding;Returned demonstration;Verbal cues required          PT Short Term Goals - 04/13/14 1236    PT SHORT TERM GOAL #1   Title Pt will be independent with HEP for improved transfers,  balance, gait.   Time 4   Period Weeks   Status Not Met   PT SHORT TERM GOAL #2   Title Pt will improve 5x sit<>stand to less than or equal to 14.5 seconds with no posterior lean for improved transfer efficiency and safety.   Status Achieved   PT SHORT TERM GOAL #3   Title Pt will improve Functional Gait Assessment score to at least 15/30 for decreased fall risk.   Status Not Met   PT SHORT TERM GOAL #4   Title Pt will improve Timed UP and Go score to less than or equal to 16 seconds for decreased fall risk.   Status Achieved   PT SHORT TERM GOAL #5   Title Pt will verbalize/demonstrate tips to reduce freezing  episodes with gait.   Time 4   Period Weeks   Status Partially Met  verbalizes these but doesn't demonstrate them           PT Long Term Goals - 03/16/14 1816    PT LONG TERM GOAL #1   Title Pt will verbalize/demonstrate understanding of fall prevention techniques within home environment. (Target 05/15/14)   Time 8   Period Weeks   Status New   PT LONG TERM GOAL #2   Title perform at least 8 of 10 reps of sit<>stand transfers from <18 inch surfaces, with no posterior lean, for improved efficiency and safety with transfers.   Time 8   Period Weeks   Status New   PT LONG TERM GOAL #3   Title Pt will improve Functional Gait Assessment to at least 20/30 for decreased fall risk.   Time 8   Status New   PT LONG TERM GOAL #4   Title Pt will improve TUG score to less than or equal to 13.5 seconds for decreased fall risk.   Time 8   Period Weeks   Status New   PT LONG TERM GOAL #5   Title Pt will verbalize plans for continued community fitness upon D/C from PT.   Time 8   Period Weeks   Status New               Plan - 04/21/14 1434    Clinical Impression Statement Use of props, then visualization of props once they are removed in therapy session seems to help with slowed, deliberate pace and widened BOS.  Pt seems to be more engaged in therapy activities knowing they are tied to functional activity at home (getting up quickly to go to get phone)   Pt will benefit from skilled therapeutic intervention in order to improve on the following deficits Abnormal gait;Decreased balance;Decreased safety awareness;Decreased coordination;Decreased mobility;Decreased strength;Decreased knowledge of use of DME   Rehab Potential Good   PT Frequency 2x / week   PT Duration 8 weeks   PT Treatment/Interventions ADLs/Self Care Home Management;DME Instruction;Stair training;Gait training;Functional mobility training;Therapeutic activities;Neuromuscular re-education;Balance training;Therapeutic  exercise;Patient/family education   PT Next Visit Plan weightshifting and large amplitude activities to decrease freezing-floor ladder/riverstones, coupled with functional home simulated activity   Consulted and Agree with Plan of Care Patient        Problem List Patient Active Problem List   Diagnosis Date Noted  . Discoloration of skin of foot 03/15/2014  . PD (Parkinson's disease) 03/15/2014  . 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 Essential hypertension 03/15/2014  . HLD (hyperlipidemia) 03/15/2014  . Cough   . LV (left ventricular) mural thrombus   .  CVA (cerebral infarction) 01/18/2014  . Imbalance 01/18/2014  . Slurring of speech   . Abnormality of gait 03/12/2012  . Paralysis agitans 03/12/2012  . Acute cholecystitis 02/04/2012  . Hypertension 02/04/2012  . Hyperlipidemia 02/04/2012    Chia Rock W. 04/21/2014, 2:37 PM  Mady Haagensen, PT 04/21/2014 2:37 PM Phone: [phone] Fax: [facility] [address] [place], [place], [address] Phone: [phone]   Fax:  [phone]

## 2014-04-25 ENCOUNTER — Ambulatory Visit: Payer: Medicare Other

## 2014-04-25 ENCOUNTER — Ambulatory Visit: Payer: Medicare Other | Attending: Neurology | Admitting: Rehabilitative and Restorative Service Providers"

## 2014-04-25 ENCOUNTER — Ambulatory Visit: Payer: Medicare Other | Admitting: Occupational Therapy

## 2014-04-25 ENCOUNTER — Telehealth: Payer: Self-pay

## 2014-04-25 ENCOUNTER — Encounter: Payer: Self-pay | Admitting: Occupational Therapy

## 2014-04-25 DIAGNOSIS — R279 Unspecified lack of coordination: Secondary | ICD-10-CM

## 2014-04-25 DIAGNOSIS — Z7409 Other reduced mobility: Secondary | ICD-10-CM

## 2014-04-25 DIAGNOSIS — R6889 Other general symptoms and signs: Secondary | ICD-10-CM

## 2014-04-25 DIAGNOSIS — R4701 Aphasia: Secondary | ICD-10-CM | POA: Insufficient documentation

## 2014-04-25 DIAGNOSIS — G20 Parkinson's disease: Secondary | ICD-10-CM | POA: Insufficient documentation

## 2014-04-25 DIAGNOSIS — Z5189 Encounter for other specified aftercare: Secondary | ICD-10-CM | POA: Insufficient documentation

## 2014-04-25 DIAGNOSIS — R29898 Other symptoms and signs involving the musculoskeletal system: Secondary | ICD-10-CM

## 2014-04-25 DIAGNOSIS — R490 Dysphonia: Secondary | ICD-10-CM

## 2014-04-25 DIAGNOSIS — R269 Unspecified abnormalities of gait and mobility: Secondary | ICD-10-CM | POA: Insufficient documentation

## 2014-04-25 NOTE — Patient Instructions (Signed)
  Please complete the assigned speech therapy homework prior to your next session.  

## 2014-04-25 NOTE — Therapy (Signed)
[facility] [address] [place], [place], [address] Phone: [phone]   Fax:  [phone]  Occupational Therapy Treatment  Patient Details  Name: Kyle Cameron MRN: [id number] Date of Birth: 1926-10-09 Referring Provider:  Seward Carol, MD  Encounter Date: 04/25/2014      OT End of Session - 04/25/14 1059    Visit Number 9   Number of Visits 17   Date for OT Re-Evaluation 05/19/14   Authorization Type 1Medicare, 2AARP, G-code needed   Authorization - Visit Number 9   Authorization - Number of Visits 10   OT Start Time 1104   OT Stop Time 1145   OT Time Calculation (min) 41 min   Activity Tolerance Patient tolerated treatment well      Past Medical History  Diagnosis Date  . Hypertension   . High cholesterol   . Prostate atrophy   . GERD (gastroesophageal reflux disease)   . History of diverticulitis of colon   . Benign enlargement of prostate   . Hypothyroidism   . History of renal calculi   . Parkinson's disease   . Renal calculi   . CVA (cerebral infarction)     Past Surgical History  Procedure Laterality Date  . Cholecystectomy  02/04/2012    Procedure: LAPAROSCOPIC CHOLECYSTECTOMY WITH INTRAOPERATIVE CHOLANGIOGRAM;  Surgeon: Merrie Roof, MD;  Location: [facility];  Service: General;  Laterality: N/A;  . Lithotripsy      renal calculi  . Tonsillectomy    . Cataract extraction, bilateral      There were no vitals filed for this visit.  Visit Diagnosis:  Lack of coordination  Hypokinesia  Rigidity                  OT Treatments/Exercises (OP) - 04/25/14 0001    ADLs   Writing writing with 25% legibility initially.  Pt instructed/reviewed PWR! hands and then performed modified quadraped PWR! moves.  Then, tracing simple shapes and capital letters with focus on slowing down and control with min decreased accuracy and min v.c.  Then progressed to writing shopping list with  approx 95% legibility.   Neurological Re-education Exercises   Reciprocal Movements Arm bike x6 min with min cues to maintain >50rpms, pt maintained 46-52rpms           [facility]) - 04/25/14 1124    PWR! exercises Moves in [place]! Up 10   PWR! Rock 10   PWR! Twist 10   PWR! Step 10   Comments modified quadraped with min v.c. for big movements               OT Short Term Goals - 04/25/14 1111    OT SHORT TERM GOAL #1   Title Pt will be independent with updated HEP.--due 04/18/14   Time 4   Period Weeks   Status On-going  04/19/14:  continues to need cues for timing and needs updates   OT SHORT TERM GOAL #2   Title Pt will improve coordination as shown by completing 9-hole peg test in 23mn or less with dominant R hand.--due 04/18/14   Baseline 140.93sec   Time 4   Period Weeks   Status Achieved  04/19/14:  95.34 sec with multiple drops   OT SHORT TERM GOAL #3   Title Pt will be able to write at least 3 sentences with at least 50% legibility.--due 04/18/14   Baseline 25%   Time 4  Period Weeks   Status On-going  25-50%   OT SHORT TERM GOAL #4   Title Pt will improve ease with eating as shown by completing PPT#2 in less than 32sec--due 04/18/14   Baseline 40.41   Time 4   Period Weeks   Status Achieved  04/19/14:  14.84sec   OT SHORT TERM GOAL #5   Title Pt will improve dressing ability as shown by improving time on PPT#4 by at least 5sec.--due 04/18/14   Baseline 04/21/14-26.52 secs, not met   Time 4   Period Weeks   Status On-going           OT Long Term Goals - 03/21/14 1333    OT LONG TERM GOAL #1   Title Pt will verbalize understanidng of adaptive strategies for ADLs/IADLs prn to increase ease/safety.--due 05/19/14   Time 8   Period Weeks   Status New   OT LONG TERM GOAL #2   Title Pt will improve coordination as shown by completing 9-hole peg test in 90sec or less with dominant R hand.--due 05/19/14   Time 8   Period Weeks   Status New    OT LONG TERM GOAL #3   Title Pt will be able to write at least 3 sentences with at least 75% legibility.--due 05/19/14   Baseline 25%   Time 8   Period Weeks   Status New   OT LONG TERM GOAL #4   Title Pt will improve ease with eating as shown by completing PPT#2 in less than 25sec--due 05/19/14   Baseline 40.41   Time 8   Period Weeks   Status New   OT LONG TERM GOAL #5   Title Pt will improve BUE functional reaching/coordination for ADLs as shown by improving score on box and blocks test by at least 5.--due 05/19/14   Baseline 39 bilaterally   Time 8   Period Weeks   Status New   OT LONG TERM GOAL #6   Title Pt will perform simple-mod complex meal prep mod I.--due 05/19/14   Time 8   Period Weeks   Status New               Plan - 04/25/14 1136    Clinical Impression Statement Pt demo improved writing after PWR! moves and focus on tracing/letter formation today.   Plan continue with functional coordination activities (break down to movements as able), timing of movement        Problem List Patient Active Problem List   Diagnosis Date Noted  . Discoloration of skin of foot 03/15/2014  . PD (Parkinson's disease) 03/15/2014  . 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 Essential hypertension 03/15/2014  . HLD (hyperlipidemia) 03/15/2014  . Cough   . LV (left ventricular) mural thrombus   . CVA (cerebral infarction) 01/18/2014  . Imbalance 01/18/2014  . Slurring of speech   . Abnormality of gait 03/12/2012  . Paralysis agitans 03/12/2012  . Acute cholecystitis 02/04/2012  . Hypertension 02/04/2012  . Hyperlipidemia 02/04/2012    [facility] 04/25/2014, 11:45 AM  [facility] [address] [place], [place], [address] Phone: [phone]   Fax:  [place], OTR/L 04/25/2014 11:45 AM

## 2014-04-25 NOTE — Therapy (Signed)
[facility] [address] [place], [place], [address] Phone: [phone]   Fax:  [phone]  Physical Therapy Treatment  Patient Details  Name: Kyle Cameron MRN: [id number] Date of Birth: 09-21-1926 Referring Provider:  Seward Carol, MD  Encounter Date: 04/25/2014      PT End of Session - 04/25/14 2241    Visit Number 12   Number of Visits 17   Date for PT Re-Evaluation 05/15/14   Authorization Type Medicare G-code every 10th visit   PT Start Time 1150   PT Stop Time 1230   PT Time Calculation (min) 40 min   Activity Tolerance Patient tolerated treatment well   Behavior During Therapy [facility] for tasks assessed/performed      Past Medical History  Diagnosis Date  . Hypertension   . High cholesterol   . Prostate atrophy   . GERD (gastroesophageal reflux disease)   . History of diverticulitis of colon   . Benign enlargement of prostate   . Hypothyroidism   . History of renal calculi   . Parkinson's disease   . Renal calculi   . CVA (cerebral infarction)     Past Surgical History  Procedure Laterality Date  . Cholecystectomy  02/04/2012    Procedure: LAPAROSCOPIC CHOLECYSTECTOMY WITH INTRAOPERATIVE CHOLANGIOGRAM;  Surgeon: Merrie Roof, MD;  Location: [facility];  Service: General;  Laterality: N/A;  . Lithotripsy      renal calculi  . Tonsillectomy    . Cataract extraction, bilateral      There were no vitals filed for this visit.  Visit Diagnosis:  Abnormality of gait  Lack of coordination  Decreased functional mobility and endurance      Subjective Assessment - 04/25/14 1218    Subjective Patient reports not doing HEP regularly because of going to gym to work with trainer and coming to therapy.   Currently in Pain? No/denies      NEUROMUSCULAR RE-EDUCATION: Standing lateral weight shifting with wide base of support Stepping over 6-8" obstacles in tight space adding turns and start/stops  with verbal cues on "stop, weight shift, then go" during festination 180 degree turns with cues on lateral weight shift to initiate movements Sidestepping and lateral direction changes with cues to decrease festination Standing trunk rotation activities with emphasis on returning to midline with UEs abducted for postural awareness  Alternating LE foot taps to cones with 360 degree turns and CGA for safety.  Gait: Functional gait activities transitioning sit>stand, walking 5',reaching to touch low surface, 180 degree turn, and then return to seat x 4 repetitions with verbal cues on "stop, weight shift, then go" to decrease festinating gait pattern  Gait with multidirectional changes including gait>R sidestepping>turns>L sidestepping>backwards marching>heel walking>toe walking  *During PT session, the patient responds well to tips to reduce festination/freezing episodes.  He reports having greater difficulty implementing these strategies in his home environment.  PT recommended he stop first and then perform weight shifting laterally.  His tendency is to continue to try to walk through freezing episodes.        PT Short Term Goals - 04/13/14 1236    PT SHORT TERM GOAL #1   Title Pt will be independent with HEP for improved transfers, balance, gait.   Time 4   Period Weeks   Status Not Met   PT SHORT TERM GOAL #2   Title Pt will improve 5x sit<>stand to less than or equal to 14.5 seconds with no posterior lean for  improved transfer efficiency and safety.   Status Achieved   PT SHORT TERM GOAL #3   Title Pt will improve Functional Gait Assessment score to at least 15/30 for decreased fall risk.   Status Not Met   PT SHORT TERM GOAL #4   Title Pt will improve Timed UP and Go score to less than or equal to 16 seconds for decreased fall risk.   Status Achieved   PT SHORT TERM GOAL #5   Title Pt will verbalize/demonstrate tips to reduce freezing episodes with gait.   Time 4   Period Weeks    Status Partially Met  verbalizes these but doesn't demonstrate them           PT Long Term Goals - 03/16/14 1816    PT LONG TERM GOAL #1   Title Pt will verbalize/demonstrate understanding of fall prevention techniques within home environment. (Target 05/15/14)   Time 8   Period Weeks   Status New   PT LONG TERM GOAL #2   Title perform at least 8 of 10 reps of sit<>stand transfers from <18 inch surfaces, with no posterior lean, for improved efficiency and safety with transfers.   Time 8   Period Weeks   Status New   PT LONG TERM GOAL #3   Title Pt will improve Functional Gait Assessment to at least 20/30 for decreased fall risk.   Time 8   Status New   PT LONG TERM GOAL #4   Title Pt will improve TUG score to less than or equal to 13.5 seconds for decreased fall risk.   Time 8   Period Weeks   Status New   PT LONG TERM GOAL #5   Title Pt will verbalize plans for continued community fitness upon D/C from PT.   Time 8   Period Weeks   Status New               Plan - 04/25/14 2242    Clinical Impression Statement The patient is responding well to cues in therapy, however reports difficulty carrying over cues to home environment.  PT and patient discussed strategies for further sucess in home environment, as well as recommendation to perform therapy related HEP in addition to gym routine.   PT Next Visit Plan weightshifting and large amplitude activities to decrease freezing-floor ladder/riverstones, coupled with functional home simulated activity   Consulted and Agree with Plan of Care Patient        Problem List Patient Active Problem List   Diagnosis Date Noted  . Discoloration of skin of foot 03/15/2014  . PD (Parkinson's disease) 03/15/2014  . 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 Essential hypertension 03/15/2014  . HLD (hyperlipidemia) 03/15/2014  . Cough   . LV (left ventricular) mural thrombus   . CVA (cerebral  infarction) 01/18/2014  . Imbalance 01/18/2014  . Slurring of speech   . Abnormality of gait 03/12/2012  . Paralysis agitans 03/12/2012  . Acute cholecystitis 02/04/2012  . Hypertension 02/04/2012  . Hyperlipidemia 02/04/2012    Ladonte Verstraete, PT 04/25/2014, 10:45 PM  [facility] [address] [place], [place], [address] Phone: [phone]   Fax:  [phone]

## 2014-04-25 NOTE — Therapy (Signed)
[facility] [address] [place], [place], [address] Phone: [phone]   Fax:  [phone]  Speech Language Pathology Treatment  Patient Details  Name: Kyle Cameron MRN: [id number] Date of Birth: Jul 01, 1926 Referring Provider:  Seward Carol, MD  Encounter Date: 04/25/2014      End of Session - 04/25/14 1221    Visit Number 11   Number of Visits 16   Date for SLP Re-Evaluation 05/14/14   SLP Start Time 23   SLP Stop Time  1059   SLP Time Calculation (min) 40 min   Activity Tolerance Patient tolerated treatment well      Past Medical History  Diagnosis Date  . Hypertension   . High cholesterol   . Prostate atrophy   . GERD (gastroesophageal reflux disease)   . History of diverticulitis of colon   . Benign enlargement of prostate   . Hypothyroidism   . History of renal calculi   . Parkinson's disease   . Renal calculi   . CVA (cerebral infarction)     Past Surgical History  Procedure Laterality Date  . Cholecystectomy  02/04/2012    Procedure: LAPAROSCOPIC CHOLECYSTECTOMY WITH INTRAOPERATIVE CHOLANGIOGRAM;  Surgeon: Merrie Roof, MD;  Location: [facility];  Service: General;  Laterality: N/A;  . Lithotripsy      renal calculi  . Tonsillectomy    . Cataract extraction, bilateral      There were no vitals filed for this visit.  Visit Diagnosis: Expressive aphasia  Hypokinetic Parkinsonian dysphonia             ADULT SLP TREATMENT - 04/25/14 1030    General Information   Behavior/Cognition Alert;Cooperative;Pleasant mood   Treatment Provided   Treatment provided Cognitive-Linquistic   Pain Assessment   Pain Assessment No/denies pain   Cognitive-Linquistic Treatment   Treatment focused on Aphasia;Dysarthria   Skilled Treatment Entered ST room without compensations for speech intelligibility. Definitions provided (1-2 sentence) with consistent mod-max A needed for pt to use  compensations. Pt routinely slowed speech for 3-4 words after SLP cue but then immediately sped up again. Pt showed signs of verbal apraxia as well as aphasia. Off topic remarks without success for compensations - mod A needed consistently. Pt's success improved dramatically when saying in unison with SLP.    Assessment / Recommendations / Plan   Plan Continue with current plan of care   Progression Toward Goals   Progression toward goals --  usual mod cues cont needed in sentence tasks            SLP Short Term Goals - 04/21/14 1143    SLP SHORT TERM GOAL #1   Title pt will repeat sentences of 10 words 80% success with rare min A   Status Not Met   SLP SHORT TERM GOAL #2   Title pt will use strategies for verbal expresion in simple conversation with occasional min-mod A   Status Not Met   SLP SHORT TERM GOAL #3   Title pt will generate sentence responses 80% success with rare min A   Status Not Met          SLP Long Term Goals - 04/25/14 1033    SLP LONG TERM GOAL #1   Title pt will increase intelligibility to 95% in 3 minutes simple conversation with rare min A   Time 3   Period Weeks   Status Revised   SLP LONG TERM GOAL #2   Title  pt will demo compensations for verbal expression in 5 minutes simple conversation with usual min A   Time 3   Period Weeks   Status Revised   SLP LONG TERM GOAL #3   Title pt will maintain loud /a/ for average 75dB over 4 sessions   Time 3   Period Weeks   Status Revised          Plan - 04/25/14 1222    Clinical Impression Statement Pt with same level of success as previously. Hindrance to therapy is primarily severity of deficits.  Skilled ST remains necessary to assess performance with longer therapy course (1-2 weeks). If limited progress after that time, d/c likely.   Speech Therapy Frequency 2x / week   Duration --  3 weeks   Treatment/Interventions Language facilitation;Functional tasks;Cueing hierarchy;Patient/family  education;SLP instruction and feedback;Compensatory techniques;Internal/external aids   Potential to Achieve Goals Fair   Potential Considerations Co-morbidities;Severity of impairments        Problem List Patient Active Problem List   Diagnosis Date Noted  . Discoloration of skin of foot 03/15/2014  . PD (Parkinson's disease) 03/15/2014  . 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 Essential hypertension 03/15/2014  . HLD (hyperlipidemia) 03/15/2014  . Cough   . LV (left ventricular) mural thrombus   . CVA (cerebral infarction) 01/18/2014  . Imbalance 01/18/2014  . Slurring of speech   . Abnormality of gait 03/12/2012  . Paralysis agitans 03/12/2012  . Acute cholecystitis 02/04/2012  . Hypertension 02/04/2012  . Hyperlipidemia 02/04/2012     [facility], SLP 04/25/2014, 12:25 PM  [facility] [address] [place], [place], [address] Phone: [phone]   Fax:  [phone]

## 2014-04-25 NOTE — Telephone Encounter (Signed)
Left message for patient to call back to reschedule 5/31 appointment.

## 2014-04-26 NOTE — Telephone Encounter (Signed)
Appointment made for 06/23/14.

## 2014-04-27 ENCOUNTER — Ambulatory Visit: Payer: Medicare Other | Admitting: Physical Therapy

## 2014-04-27 ENCOUNTER — Encounter: Payer: Self-pay | Admitting: Physical Therapy

## 2014-04-27 ENCOUNTER — Ambulatory Visit: Payer: Medicare Other | Admitting: Occupational Therapy

## 2014-04-27 ENCOUNTER — Ambulatory Visit: Payer: Medicare Other

## 2014-04-27 DIAGNOSIS — R498 Other voice and resonance disorders: Secondary | ICD-10-CM

## 2014-04-27 DIAGNOSIS — R269 Unspecified abnormalities of gait and mobility: Secondary | ICD-10-CM

## 2014-04-27 DIAGNOSIS — Z5189 Encounter for other specified aftercare: Secondary | ICD-10-CM | POA: Diagnosis not present

## 2014-04-27 DIAGNOSIS — R29898 Other symptoms and signs involving the musculoskeletal system: Secondary | ICD-10-CM

## 2014-04-27 DIAGNOSIS — R4701 Aphasia: Secondary | ICD-10-CM

## 2014-04-27 DIAGNOSIS — R279 Unspecified lack of coordination: Secondary | ICD-10-CM

## 2014-04-27 DIAGNOSIS — R6889 Other general symptoms and signs: Secondary | ICD-10-CM

## 2014-04-27 DIAGNOSIS — M6281 Muscle weakness (generalized): Secondary | ICD-10-CM

## 2014-04-27 NOTE — Therapy (Signed)
[facility] [address] [place], [place], [address] Phone: [phone]   Fax:  [phone]  Occupational Therapy Treatment  Patient Details  Name: Kyle Cameron MRN: [id number] Date of Birth: [birth year] Referring Provider:  Seward Carol, MD  Encounter Date: 04/27/2014      OT End of Session - 04/27/14 1030    Visit Number 10   Number of Visits 17   Date for OT Re-Evaluation 05/19/14   Authorization Type 1Medicare, 2[employer], G-code needed   OT Start Time 1020   OT Stop Time 1100   OT Time Calculation (min) 40 min      Past Medical History  Diagnosis Date  . Hypertension   . High cholesterol   . Prostate atrophy   . GERD (gastroesophageal reflux disease)   . History of diverticulitis of colon   . Benign enlargement of prostate   . Hypothyroidism   . History of renal calculi   . Parkinson's disease   . Renal calculi   . CVA (cerebral infarction)     Past Surgical History  Procedure Laterality Date  . Cholecystectomy  02/04/2012    Procedure: LAPAROSCOPIC CHOLECYSTECTOMY WITH INTRAOPERATIVE CHOLANGIOGRAM;  Surgeon: Merrie Roof, MD;  Location: [facility];  Service: General;  Laterality: N/A;  . Lithotripsy      renal calculi  . Tonsillectomy    . Cataract extraction, bilateral      There were no vitals filed for this visit.  Visit Diagnosis:  Hypokinesia  Rigidity  Lack of coordination  Abnormality of gait  Generalized muscle weakness      Subjective Assessment - 04/27/14 1019    Subjective  Pt reports he went to the opera this weekekend   Pertinent History Pt with PD (diagnosed 3 years ago) with 2 CVAs 12/15 followed by home health therapies.  Pt reports PD worse after CVA.  Pt reports increased difficulty with speech, R hand, and walking.   Patient Stated Goals improve walking, R hand use, speech   Currently in Pain? No/denies                    OT Treatments/Exercises (OP) -  04/27/14 0001    Fine Motor Coordination   Small Pegboard min difficulty to copy design with R hand , improved performance today  PWR! hands prior to task   Neurological Re-education Exercises   Reciprocal Movements Arm bike x6 min with min cues to maintain >50rpms, pt maintained 46-52rpms           P[facility]) - 04/27/14 1052    PWR! Up 10   PWR! Rock 10   PWR! Twist 10   PWR! Step 10   Comments modified quadraped at tabletop, min v.c.     G-code completed.          OT Short Term Goals - 04/25/14 1111    OT SHORT TERM GOAL #1   Title Pt will be independent with updated HEP.--due 04/18/14   Time 4   Period Weeks   Status On-going  04/19/14:  continues to need cues for timing and needs updates   OT SHORT TERM GOAL #2   Title Pt will improve coordination as shown by completing 9-hole peg test in 66mn or less with dominant R hand.--due 04/18/14   Baseline 140.93sec   Time 4   Period Weeks   Status Achieved  04/19/14:  95.34 sec with multiple drops   OT SHORT TERM GOAL #3  Title Pt will be able to write at least 3 sentences with at least 50% legibility.--due 04/18/14   Baseline 25%   Time 4   Period Weeks   Status On-going  25-50%   OT SHORT TERM GOAL #4   Title Pt will improve ease with eating as shown by completing PPT#2 in less than 32sec--due 04/18/14   Baseline 40.41   Time 4   Period Weeks   Status Achieved  04/19/14:  14.84sec   OT SHORT TERM GOAL #5   Title Pt will improve dressing ability as shown by improving time on PPT#4 by at least 5sec.--due 04/18/14   Baseline 04/21/14-26.52 secs, not met   Time 4   Period Weeks   Status On-going           OT Long Term Goals - 03/21/14 1333    OT LONG TERM GOAL #1   Title Pt will verbalize understanidng of adaptive strategies for ADLs/IADLs prn to increase ease/safety.--due 05/19/14   Time 8   Period Weeks   Status New   OT LONG TERM GOAL #2   Title Pt will improve coordination as shown by completing 9-hole  peg test in 90sec or less with dominant R hand.--due 05/19/14   Time 8   Period Weeks   Status New   OT LONG TERM GOAL #3   Title Pt will be able to write at least 3 sentences with at least 75% legibility.--due 05/19/14   Baseline 25%   Time 8   Period Weeks   Status New   OT LONG TERM GOAL #4   Title Pt will improve ease with eating as shown by completing PPT#2 in less than 25sec--due 05/19/14   Baseline 40.41   Time 8   Period Weeks   Status New   OT LONG TERM GOAL #5   Title Pt will improve BUE functional reaching/coordination for ADLs as shown by improving score on box and blocks test by at least 5.--due 05/19/14   Baseline 39 bilaterally   Time 8   Period Weeks   Status New   OT LONG TERM GOAL #6   Title Pt will perform simple-mod complex meal prep mod I.--due 05/19/14   Time 8   Period Weeks   Status New               Plan - 27-May-2014 1314    Clinical Impression Statement Pt is progressing towards goals with improved 9 hole peg test and PPT #2.   Rehab Potential Good   Clinical Impairments Affecting Rehab Potential speed and timing.          G-Codes - 05-27-14 1033    Functional Assessment Tool Used PPT#2 14.28sec, 9-hole peg test:  R-55.93 secs   Functional Limitation Carrying, moving and handling objects   Carrying, Moving and Handling Objects Current Status [phone]) At least 40 percent but less than 60 percent impaired, limited or restricted   Carrying, Moving and Handling Objects Goal Status (D6644) At least 20 percent but less than 40 percent impaired, limited or restricted      Problem List Patient Active Problem List   Diagnosis Date Noted  . Discoloration of skin of foot 03/15/2014  . PD (Parkinson's disease) 03/15/2014  . 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 Essential hypertension 03/15/2014  . HLD (hyperlipidemia) 03/15/2014  . Cough   . LV (left ventricular) mural thrombus   . CVA (cerebral infarction)  01/18/2014  . Imbalance 01/18/2014  .  Slurring of speech   . Abnormality of gait 03/12/2012  . Paralysis agitans 03/12/2012  . Acute cholecystitis 02/04/2012  . Hypertension 02/04/2012  . Hyperlipidemia 02/04/2012    [facility] Favor 04/27/2014, 1:17 PM Theone Murdoch, OTR/L [phone] Phone: [phone] 1:18 PM 04/27/2014 [facility] [address] [place], [place], [address] Phone: [phone]   Fax:  [phone]

## 2014-04-27 NOTE — Patient Instructions (Signed)
  Please complete the assigned speech therapy homework prior to your next session.  

## 2014-04-27 NOTE — Therapy (Signed)
[facility] [address] [place], [place], [address] Phone: [phone]   Fax:  [phone]  Speech Language Pathology Treatment  Patient Details  Name: Kyle Cameron MRN: [id number] Date of Birth: Jul 06, 1926 Referring Provider:  Seward Carol, MD  Encounter Date: 04/27/2014      End of Session - 04/27/14 1143    Visit Number 12   Number of Visits 16   Date for SLP Re-Evaluation 05/14/14   SLP Start Time 34   SLP Stop Time  1146   SLP Time Calculation (min) 40 min   Activity Tolerance Patient tolerated treatment well      Past Medical History  Diagnosis Date  . Hypertension   . High cholesterol   . Prostate atrophy   . GERD (gastroesophageal reflux disease)   . History of diverticulitis of colon   . Benign enlargement of prostate   . Hypothyroidism   . History of renal calculi   . Parkinson's disease   . Renal calculi   . CVA (cerebral infarction)     Past Surgical History  Procedure Laterality Date  . Cholecystectomy  02/04/2012    Procedure: LAPAROSCOPIC CHOLECYSTECTOMY WITH INTRAOPERATIVE CHOLANGIOGRAM;  Surgeon: Merrie Roof, MD;  Location: [facility];  Service: General;  Laterality: N/A;  . Lithotripsy      renal calculi  . Tonsillectomy    . Cataract extraction, bilateral      There were no vitals filed for this visit.  Visit Diagnosis: Expressive aphasia  Hypophonia      Subjective Assessment - 04/27/14 1107    Subjective Pt's speech rate fast and message was unclear at times, upon entering tx room. He did not attempt at improving message clarity.   Patient is accompained by: --  alone               ADULT SLP TREATMENT - 04/27/14 1109    General Information   Behavior/Cognition Alert;Cooperative;Pleasant mood   Treatment Provided   Treatment provided Cognitive-Linquistic   Pain Assessment   Pain Assessment No/denies pain   Cognitive-Linquistic Treatment   Treatment  focused on Aphasia;Dysarthria   Skilled Treatment Cont to enter ST room without notable change in speech rate. Pt read sentences with occasional min-mod A for slower rate, then provided answer (one-two sentence responses) with consistentent mod cues (including written reminder), faded to usual min-mod A by task end. SLP provided examples to pt of reduced speech rate, and reiterated that these rates are within the normal limits for rate of speech.      Assessment / Recommendations / Plan   Plan Continue with current plan of care   Progression Toward Goals   Progression toward goals Progressing toward goals          SLP Education - 04/27/14 1142    Education provided Yes   Education Details slow speech rate   Methods Explanation;Demonstration;Verbal cues   Comprehension Verbalized understanding;Returned demonstration;Need further instruction;Verbal cues required          SLP Short Term Goals - 04/21/14 1143    SLP SHORT TERM GOAL #1   Title pt will repeat sentences of 10 words 80% success with rare min A   Status Not Met   SLP SHORT TERM GOAL #2   Title pt will use strategies for verbal expresion in simple conversation with occasional min-mod A   Status Not Met   SLP SHORT TERM GOAL #3   Title pt will generate sentence responses  80% success with rare min A   Status Not Met          SLP Long Term Goals - 04/27/14 1145    SLP LONG TERM GOAL #1   Title pt will increase intelligibility to 95% in 3 minutes simple conversation with rare min A   Time 3   Period Weeks   Status Revised   SLP LONG TERM GOAL #2   Title pt will demo compensations for verbal expression in 5 minutes simple conversation with usual min A   Time 3   Period Weeks   Status Revised   SLP LONG TERM GOAL #3   Title pt will maintain loud /a/ for average 75dB over 4 sessions   Time 3   Period Weeks   Status Revised          Plan - 04/27/14 1143    Clinical Impression Statement Pt with slightly better  success with compensatory strategies today than last session. Still cont to require SLP A for reducing rate. Diagnosis of Parkinson's hinders progress.    Speech Therapy Frequency 2x / week   Duration --  3 weeks   Treatment/Interventions Language facilitation;Functional tasks;Cueing hierarchy;Patient/family education;SLP instruction and feedback;Compensatory techniques;Internal/external aids   Potential to Achieve Goals Fair   Potential Considerations Co-morbidities;Severity of impairments   Consulted and Agree with Plan of Care Patient        Problem List Patient Active Problem List   Diagnosis Date Noted  . Discoloration of skin of foot 03/15/2014  . PD (Parkinson's disease) 03/15/2014  . 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 Essential hypertension 03/15/2014  . HLD (hyperlipidemia) 03/15/2014  . Cough   . LV (left ventricular) mural thrombus   . CVA (cerebral infarction) 01/18/2014  . Imbalance 01/18/2014  . Slurring of speech   . Abnormality of gait 03/12/2012  . Paralysis agitans 03/12/2012  . Acute cholecystitis 02/04/2012  . Hypertension 02/04/2012  . Hyperlipidemia 02/04/2012    Garald Balding, SLP 04/27/2014, 11:51 AM  [facility] [address] [place], [place], [address] Phone: [phone]   Fax:  [phone]

## 2014-04-28 NOTE — Therapy (Signed)
[facility] [address] [place], [place], [address] Phone: [phone]   Fax:  [phone]  Physical Therapy Treatment  Patient Details  Name: Kyle Cameron MRN: [id number] Date of Birth: [birth year] Referring Provider:  Seward Carol, MD  Encounter Date: 04/27/2014      PT End of Session - 04/28/14 1307    Visit Number 13   Number of Visits 17   Date for PT Re-Evaluation 05/15/14   Authorization Type Medicare G-code every 10th visit   PT Start Time 1150   PT Stop Time 1233   PT Time Calculation (min) 43 min   Equipment Utilized During Treatment Gait belt   Activity Tolerance Patient tolerated treatment well   Behavior During Therapy [facility] for tasks assessed/performed      Past Medical History  Diagnosis Date  . Hypertension   . High cholesterol   . Prostate atrophy   . GERD (gastroesophageal reflux disease)   . History of diverticulitis of colon   . Benign enlargement of prostate   . Hypothyroidism   . History of renal calculi   . Parkinson's disease   . Renal calculi   . CVA (cerebral infarction)     Past Surgical History  Procedure Laterality Date  . Cholecystectomy  02/04/2012    Procedure: LAPAROSCOPIC CHOLECYSTECTOMY WITH INTRAOPERATIVE CHOLANGIOGRAM;  Surgeon: Merrie Roof, MD;  Location: [facility];  Service: General;  Laterality: N/A;  . Lithotripsy      renal calculi  . Tonsillectomy    . Cataract extraction, bilateral      There were no vitals filed for this visit.  Visit Diagnosis:  Abnormality of gait      Subjective Assessment - 04/27/14 1149    Subjective Denies falls or changes.  Trying to do the exercises.   Pertinent History CVA 12/26/13 and 01/18/14, Parkinson's disease   Patient Stated Goals Pt's goal for therapy is to help with the initiation of walking.   Currently in Pain? No/denies                       [facility] Adult PT Treatment/Exercise - 04/28/14 1304    Ambulation/Gait   Ambulation/Gait Yes   Ambulation/Gait Assistance 5: Supervision   Ambulation/Gait Assistance Details Significant freezing/festinating with initiation and turns   Ambulation Distance (Feet) 300 Feet  three times   Assistive device Straight cane   Gait Pattern Decreased step length - right;Decreased step length - left;Narrow base of support;Poor foot clearance - left;Poor foot clearance - right   Ambulation Surface Level;Indoor                                        LSVT [facility]) - 04/28/14 1255    Step and Reach Forward Other reps (comment)  20   Step and Reach Backward Other reps (comment)  20   Step and Reach Sideways Other reps (comment)  20   Other Tasks 1 stand from chair, walking around cones/obstacles and return to sitting-repeated x 8 times.  Pt with frequent freezing episodes, especially with closer distance between objects.   Other Tasks 2 multi directional movements of forward/backward and side stepping while reading instructions from board.         Balance Exercises - 04/28/14 1305    Balance Exercises: Standing   Other Standing Exercises floor ladder with min guard assist  and cane;single, double and tipping/uprighting cones with min assist             PT Short Term Goals - 04/13/14 1236    PT SHORT TERM GOAL #1   Title Pt will be independent with HEP for improved transfers, balance, gait.   Time 4   Period Weeks   Status Not Met   PT SHORT TERM GOAL #2   Title Pt will improve 5x sit<>stand to less than or equal to 14.5 seconds with no posterior lean for improved transfer efficiency and safety.   Status Achieved   PT SHORT TERM GOAL #3   Title Pt will improve Functional Gait Assessment score to at least 15/30 for decreased fall risk.   Status Not Met   PT SHORT TERM GOAL #4   Title Pt will improve Timed UP and Go score to less than or equal to 16 seconds for decreased fall risk.   Status Achieved   PT SHORT TERM GOAL #5    Title Pt will verbalize/demonstrate tips to reduce freezing episodes with gait.   Time 4   Period Weeks   Status Partially Met  verbalizes these but doesn't demonstrate them           PT Long Term Goals - 03/16/14 1816    PT LONG TERM GOAL #1   Title Pt will verbalize/demonstrate understanding of fall prevention techniques within home environment. (Target 05/15/14)   Time 8   Period Weeks   Status New   PT LONG TERM GOAL #2   Title perform at least 8 of 10 reps of sit<>stand transfers from <18 inch surfaces, with no posterior lean, for improved efficiency and safety with transfers.   Time 8   Period Weeks   Status New   PT LONG TERM GOAL #3   Title Pt will improve Functional Gait Assessment to at least 20/30 for decreased fall risk.   Time 8   Status New   PT LONG TERM GOAL #4   Title Pt will improve TUG score to less than or equal to 13.5 seconds for decreased fall risk.   Time 8   Period Weeks   Status New   PT LONG TERM GOAL #5   Title Pt will verbalize plans for continued community fitness upon D/C from PT.   Time 8   Period Weeks   Status New               Plan - 04/28/14 1308    Clinical Impression Statement Pt continues with difficulty initiating and freezing.  Continue PT per POC.   Pt will benefit from skilled therapeutic intervention in order to improve on the following deficits Abnormal gait;Decreased balance;Decreased safety awareness;Decreased coordination;Decreased mobility;Decreased strength;Decreased knowledge of use of DME   Rehab Potential Good   PT Frequency 2x / week   PT Duration 8 weeks   PT Treatment/Interventions ADLs/Self Care Home Management;DME Instruction;Stair training;Gait training;Functional mobility training;Therapeutic activities;Neuromuscular re-education;Balance training;Therapeutic exercise;Patient/family education   PT Next Visit Plan weightshifting and large amplitude activities to decrease freezing-floor ladder/riverstones,  coupled with functional home simulated activity   Consulted and Agree with Plan of Care Patient        Problem List Patient Active Problem List   Diagnosis Date Noted  . Discoloration of skin of foot 03/15/2014  . PD (Parkinson's disease) 03/15/2014  . 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 Essential hypertension 03/15/2014  . HLD (hyperlipidemia) 03/15/2014  . Cough   .  LV (left ventricular) mural thrombus   . CVA (cerebral infarction) 01/18/2014  . Imbalance 01/18/2014  . Slurring of speech   . Abnormality of gait 03/12/2012  . Paralysis agitans 03/12/2012  . Acute cholecystitis 02/04/2012  . Hypertension 02/04/2012  . Hyperlipidemia 02/04/2012    [name] 04/28/2014, 1:10 PM  [facility] [address] [place], [place], [address] Phone: [phone]   Fax:  [place], [place] 04/28/2014 1:10 PM Phone: [phone] Fax: [phone]

## 2014-05-03 ENCOUNTER — Ambulatory Visit: Payer: Medicare Other | Admitting: Occupational Therapy

## 2014-05-03 ENCOUNTER — Ambulatory Visit: Payer: Medicare Other

## 2014-05-03 ENCOUNTER — Encounter: Payer: Self-pay | Admitting: Occupational Therapy

## 2014-05-03 DIAGNOSIS — R29898 Other symptoms and signs involving the musculoskeletal system: Secondary | ICD-10-CM

## 2014-05-03 DIAGNOSIS — G20 Parkinson's disease: Secondary | ICD-10-CM

## 2014-05-03 DIAGNOSIS — Z5189 Encounter for other specified aftercare: Secondary | ICD-10-CM | POA: Diagnosis not present

## 2014-05-03 DIAGNOSIS — R4701 Aphasia: Secondary | ICD-10-CM

## 2014-05-03 DIAGNOSIS — Z7409 Other reduced mobility: Secondary | ICD-10-CM

## 2014-05-03 DIAGNOSIS — R279 Unspecified lack of coordination: Secondary | ICD-10-CM

## 2014-05-03 DIAGNOSIS — R490 Dysphonia: Secondary | ICD-10-CM

## 2014-05-03 DIAGNOSIS — R6889 Other general symptoms and signs: Secondary | ICD-10-CM

## 2014-05-03 NOTE — Patient Instructions (Signed)
Make sure and practice every day with slower speech - put in pauses and talk like you are talking abnormally slow.

## 2014-05-03 NOTE — Therapy (Signed)
[facility] [address] [place], [place], [address] Phone: [phone]   Fax:  [phone]  Occupational Therapy Treatment  Patient Details  Name: Kyle Cameron MRN: [id number] Date of Birth: 07/19/1926 Referring Provider:  Seward Carol, MD  Encounter Date: 05/03/2014      OT End of Session - 05/03/14 0940    Visit Number 11   Number of Visits 17   Date for OT Re-Evaluation 05/19/14   Authorization Type 1Medicare, 2[employer], G-code needed   Authorization - Visit Number 11   Authorization - Number of Visits 20   OT Start Time 0934   OT Stop Time 1015   OT Time Calculation (min) 41 min   Activity Tolerance Patient tolerated treatment well      Past Medical History  Diagnosis Date  . Hypertension   . High cholesterol   . Prostate atrophy   . GERD (gastroesophageal reflux disease)   . History of diverticulitis of colon   . Benign enlargement of prostate   . Hypothyroidism   . History of renal calculi   . Parkinson's disease   . Renal calculi   . CVA (cerebral infarction)     Past Surgical History  Procedure Laterality Date  . Cholecystectomy  02/04/2012    Procedure: LAPAROSCOPIC CHOLECYSTECTOMY WITH INTRAOPERATIVE CHOLANGIOGRAM;  Surgeon: Merrie Roof, MD;  Location: [facility];  Service: General;  Laterality: N/A;  . Lithotripsy      renal calculi  . Tonsillectomy    . Cataract extraction, bilateral      There were no vitals filed for this visit.  Visit Diagnosis:  Lack of coordination  Hypokinesia  Rigidity  Decreased functional mobility and endurance      Subjective Assessment - 05/03/14 0938    Subjective  Pt reports that he fell this weekend when bending over--fell backwards with no injuries.  Pt reports that he is cooking every night, but is not able to clean up after due to fatigue.  I just can't feel my hand.                                            Pertinent History Pt with PD  (diagnosed 3 years ago) with 2 CVAs 12/15 followed by home health therapies.  Pt reports PD worse after CVA.  Pt reports increased difficulty with speech, R hand, and walking.   Patient Stated Goals improve walking, R hand use, speech   Currently in Pain? No/denies                    OT Treatments/Exercises (OP) - 05/03/14 0001    Fine Motor Coordination   In Hand Manipulation Training Rotating relaxation balls in R hand with mod difficulty/drops and min-mod v.c.   Grooved pegs placing pegs in with max difficulty with R hand, min v.c.   Neurological Re-education Exercises   Reciprocal Movements Arm bike x6 min (forward/backward) with min cues to maintain >50rpms, pt maintained >50 rpms forward, but 39-42rpms backwards   Functional Reaching Activities   High Level Functional reaching to place medium pegs in vertical pegboard for increased coordination with RUE and set-up/targets for big movements.  Pt with mod difficulty/drops.                  OT Short Term Goals - 05/03/14 6384  OT SHORT TERM GOAL #1   Title Pt will be independent with updated HEP.--due 04/18/14   Time 4   Period Weeks   Status On-going  04/19/14:  continues to need cues for timing and needs updates   OT SHORT TERM GOAL #2   Title Pt will improve coordination as shown by completing 9-hole peg test in 8min or less with dominant R hand.--due 04/18/14   Baseline 140.93sec   Time 4   Period Weeks   Status Achieved  04/19/14:  95.34 sec with multiple drops   OT SHORT TERM GOAL #3   Title Pt will be able to write at least 3 sentences with at least 50% legibility.--due 04/18/14   Baseline 25%   Time 4   Period Weeks   Status On-going  25-50%   OT SHORT TERM GOAL #4   Title Pt will improve ease with eating as shown by completing PPT#2 in less than 32sec--due 04/18/14   Baseline 40.41   Time 4   Period Weeks   Status Achieved  04/19/14:  14.84sec   OT SHORT TERM GOAL #5   Title Pt will improve  dressing ability as shown by improving time on PPT#4 by at least 5sec.--due 04/18/14   Baseline 04/21/14-26.52 secs, not met   Time 4   Period Weeks   Status On-going           OT Long Term Goals - 05/03/14 0942    OT LONG TERM GOAL #1   Title Pt will verbalize understanidng of adaptive strategies for ADLs/IADLs prn to increase ease/safety.--due 05/19/14   Time 8   Period Weeks   Status New   OT LONG TERM GOAL #2   Title Pt will improve coordination as shown by completing 9-hole peg test in 45 sec or less with dominant R hand.--due 05/19/14   Time 8   Period Weeks   Status Revised  Met 04/27/14:  55.93   OT LONG TERM GOAL #3   Title Pt will be able to write at least 3 sentences with at least 75% legibility.--due 05/19/14   Baseline 25%   Time 8   Period Weeks   Status New   OT LONG TERM GOAL #4   Title Pt will improve ease with eating as shown by completing PPT#2 in less than 25sec--due 05/19/14   Baseline 40.41   Time 8   Period Weeks   Status Achieved  14.18sec on 04/19/14   OT LONG TERM GOAL #5   Title Pt will improve BUE functional reaching/coordination for ADLs as shown by improving score on box and blocks test by at least 5.--due 05/19/14   Baseline 39 bilaterally   Time 8   Period Weeks   Status New   OT LONG TERM GOAL #6   Title Pt will perform simple-mod complex meal prep mod I.--due 05/19/14   Time 8   Period Weeks   Status Achieved  per pt report 05/03/14               Plan - 05/03/14 0947    Clinical Impression Statement Pt progressing well with improved coordination and RUE functional use.  LTG#6 met per pt report.  LTG#2 updated as pt met 04/27/14.   Plan continue with functional coordination activites, timing of movement   OT Home Exercise Plan HEP issued:  coordination, PWR! modified quadraped issued 04/21/14   Consulted and Agree with Plan of Care Patient        Problem List  Patient Active Problem List   Diagnosis Date Noted  . Discoloration of  skin of foot 03/15/2014  . PD (Parkinson's disease) 03/15/2014  . 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 Essential hypertension 03/15/2014  . HLD (hyperlipidemia) 03/15/2014  . Cough   . LV (left ventricular) mural thrombus   . CVA (cerebral infarction) 01/18/2014  . Imbalance 01/18/2014  . Slurring of speech   . Abnormality of gait 03/12/2012  . Paralysis agitans 03/12/2012  . Acute cholecystitis 02/04/2012  . Hypertension 02/04/2012  . Hyperlipidemia 02/04/2012    [facility] 05/03/2014, 10:28 AM  [facility] [address] [place], [place], [address]   Berkley Wrightsman, OTR/L 05/03/2014 10:28 AM

## 2014-05-03 NOTE — Therapy (Signed)
[facility] [address] [place], [place], [address] Phone: [phone]   Fax:  [phone]  Speech Language Pathology Treatment  Patient Details  Name: Kyle Cameron MRN: [id number] Date of Birth: [birth year] Referring Provider:  Seward Carol, MD  Encounter Date: 05/03/2014      End of Session - 05/03/14 0936    Visit Number 13   Number of Visits 16   Date for SLP Re-Evaluation 05/14/14   SLP Start Time 0847   SLP Stop Time  0930   SLP Time Calculation (min) 43 min   Activity Tolerance Patient tolerated treatment well      Past Medical History  Diagnosis Date  . Hypertension   . High cholesterol   . Prostate atrophy   . GERD (gastroesophageal reflux disease)   . History of diverticulitis of colon   . Benign enlargement of prostate   . Hypothyroidism   . History of renal calculi   . Parkinson's disease   . Renal calculi   . CVA (cerebral infarction)     Past Surgical History  Procedure Laterality Date  . Cholecystectomy  02/04/2012    Procedure: LAPAROSCOPIC CHOLECYSTECTOMY WITH INTRAOPERATIVE CHOLANGIOGRAM;  Surgeon: Merrie Roof, MD;  Location: [facility];  Service: General;  Laterality: N/A;  . Lithotripsy      renal calculi  . Tonsillectomy    . Cataract extraction, bilateral      There were no vitals filed for this visit.  Visit Diagnosis: Expressive aphasia  Hypokinetic Parkinsonian dysphonia      Subjective Assessment - 05/03/14 0856    Subjective Pt fell Sunday night. Reports no lingering damage/pain today.               ADULT SLP TREATMENT - 05/03/14 0857    General Information   Behavior/Cognition Alert;Cooperative;Pleasant mood   Treatment Provided   Treatment provided Cognitive-Linquistic   Pain Assessment   Pain Assessment No/denies pain   Cognitive-Linquistic Treatment   Treatment focused on Aphasia;Dysarthria   Skilled Treatment Entered ST room with fast rate of  speech, abandoning utterances. Read sentences with slowed rate with 25% success, requiring SLP mod A usually, including simultaneous speech. Pt went to PCP yesterday and asked him about driving. The PCP told the patient he could drive but to be careful. In conversation, pt demonstrated multiple aphasic errors complicated by motor speech deficits from Parkinson's Disease.    Assessment / Recommendations / Plan   Plan Continue with current plan of care   Progression Toward Goals   Progression toward goals Not progressing toward goals (comment)  If therapy ended today,would not cont d/t limited progress          SLP Education - 05/03/14 0935    Education provided Yes   Education Details slow rate, progress thus far in therapy   Person(s) Educated Patient   Methods Explanation   Comprehension Verbalized understanding  demo'd understanding that if therapy ended today it would not be cont due to limited progress'          SLP Short Term Goals - 04/21/14 1143    SLP SHORT TERM GOAL #1   Title pt will repeat sentences of 10 words 80% success with rare min A   Status Not Met   SLP SHORT TERM GOAL #2   Title pt will use strategies for verbal expresion in simple conversation with occasional min-mod A   Status Not Met   SLP SHORT TERM GOAL #3  Title pt will generate sentence responses 80% success with rare min A   Status Not Met          SLP Long Term Goals - 05/03/14 0939    SLP LONG TERM GOAL #1   Title pt will increase intelligibility to 95% in 3 minutes simple conversation with rare min A   Time 2   Period Weeks   Status Revised   SLP LONG TERM GOAL #2   Title pt will demo compensations for verbal expression in 5 minutes simple conversation with usual min A   Time 2   Period Weeks   Status Revised   SLP LONG TERM GOAL #3   Title pt will maintain loud /a/ for average 75dB over 4 sessions   Time 2   Period Weeks   Status Revised          Plan - 05/03/14 0936     Clinical Impression Statement Limited success today - consistent cues necessary.   Speech Therapy Frequency 2x / week   Duration 2 weeks   Treatment/Interventions Language facilitation;Functional tasks;Cueing hierarchy;Patient/family education;SLP instruction and feedback;Compensatory techniques;Internal/external aids   Potential to Achieve Goals Fair   Potential Considerations Co-morbidities;Severity of impairments        Problem List Patient Active Problem List   Diagnosis Date Noted  . Discoloration of skin of foot 03/15/2014  . PD (Parkinson's disease) 03/15/2014  . 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 Essential hypertension 03/15/2014  . HLD (hyperlipidemia) 03/15/2014  . Cough   . LV (left ventricular) mural thrombus   . CVA (cerebral infarction) 01/18/2014  . Imbalance 01/18/2014  . Slurring of speech   . Abnormality of gait 03/12/2012  . Paralysis agitans 03/12/2012  . Acute cholecystitis 02/04/2012  . Hypertension 02/04/2012  . Hyperlipidemia 02/04/2012    [facility], SLP 05/03/2014, 9:40 AM  [facility] [address] [place], [place], [address] Phone: [phone]   Fax:  [phone]

## 2014-05-04 ENCOUNTER — Ambulatory Visit: Payer: Medicare Other | Admitting: Occupational Therapy

## 2014-05-04 ENCOUNTER — Ambulatory Visit: Payer: Medicare Other

## 2014-05-04 ENCOUNTER — Ambulatory Visit: Payer: Medicare Other | Admitting: Physical Therapy

## 2014-05-04 DIAGNOSIS — R4701 Aphasia: Secondary | ICD-10-CM

## 2014-05-04 DIAGNOSIS — Z7409 Other reduced mobility: Secondary | ICD-10-CM

## 2014-05-04 DIAGNOSIS — R490 Dysphonia: Secondary | ICD-10-CM

## 2014-05-04 DIAGNOSIS — R29898 Other symptoms and signs involving the musculoskeletal system: Secondary | ICD-10-CM

## 2014-05-04 DIAGNOSIS — R279 Unspecified lack of coordination: Secondary | ICD-10-CM

## 2014-05-04 DIAGNOSIS — M6281 Muscle weakness (generalized): Secondary | ICD-10-CM

## 2014-05-04 DIAGNOSIS — Z5189 Encounter for other specified aftercare: Secondary | ICD-10-CM | POA: Diagnosis not present

## 2014-05-04 DIAGNOSIS — R269 Unspecified abnormalities of gait and mobility: Secondary | ICD-10-CM

## 2014-05-04 NOTE — Patient Instructions (Signed)
It is important to avoid accidents which may result in broken bones.  Here are a few ideas on how to make your home safer so you will be less likely to trip or fall.   Use nonskid mats or non slip strips in your shower or tub, on your bathroom floor and around sinks.  If you know that you have spilled water, wipe it up!  In the bathroom, it is important to have properly installed grab bars on the walls or on the edge of the tub.  Towel racks are NOT strong enough for you to hold onto or to pull on for support.  Stairs and hallways should have enough light.  Add lamps or night lights if you need ore light.  It is good to have handrails on both sides of the stairs if possible.  Always fix broken handrails right away.  It is important to see the edges of steps.  Paint the edges of outdoor steps white so you can see them better.  Put colored tape on the edge of inside steps.  Throw-rugs are dangerous because they can slide.  Removing the rugs is the best idea, but if they must stay, add adhesive carpet tape to prevent slipping.  Do not keep things on stairs or in the halls.  Remove small furniture that blocks the halls as it may cause you to trip.  Keep telephone and electrical cords out of the way where you walk.  Always were sturdy, rubber-soled shoes for good support.  Never wear just socks, especially on the stairs.  Socks may cause you to slip or fall.  Do not wear full-length housecoats as you can easily trip on the bottom.   Place the things you use the most on the shelves that are the easiest to reach.  If you use a stepstool, make sure it is in good condition.  If you feel unsteady, DO NOT climb, ask for help.  If a health professional advises you to use a cane or walker, do not be ashamed.  These items can keep you from falling and breaking your bones.  Tips to reduce freezing episodes with standing or walking:   Stand tall with your feet wide, so that you can rock and weight shift  through your hips.  Don't try to fight the freeze: if you begin taking slower, faster, smaller steps, STOP, get your posture tall, and RESET your posture and balance.  Take a deep breath before taking the BIG step to start again.  March in place, with high knee stepping, to get started walking again.  Use auditory cues:  Count out loud, think of a familiar tune or song or cadence, use pocket metronome, to use rhythm to get started walking again.  Use visual cues:  Use a line to step over, use laser pointer line to step over, (using BIG steps) to start walking again.  Use visual targets to keep your posture tall (look ahead and focus on an object or target at eye level).  As you approach where your destination with walking, count your steps out loud and/or focus on your target with your eyes until you are fully there.  Use appropriate assistive device, as advised by your physical therapist to assist with taking longer, consistent steps.   Fall Prevention and Home Safety Falls cause injuries and can affect all age groups. It is possible to use preventive measures to significantly decrease the likelihood of falls. There are many simple measures which  can make your home safer and prevent falls. OUTDOORS  Repair cracks and edges of walkways and driveways.  Remove high doorway thresholds.  Trim shrubbery on the main path into your home.  Have good outside lighting.  Clear walkways of tools, rocks, debris, and clutter.  Check that handrails are not broken and are securely fastened. Both sides of steps should have handrails.  Have leaves, snow, and ice cleared regularly.  Use sand or salt on walkways during winter months.  In the garage, clean up grease or oil spills. BATHROOM  Install night lights.  Install grab bars by the toilet and in the tub and shower.  Use non-skid mats or decals in the tub or shower.  Place a plastic non-slip stool in the shower to sit on, if  needed.  Keep floors dry and clean up all water on the floor immediately.  Remove soap buildup in the tub or shower on a regular basis.  Secure bath mats with non-slip, double-sided rug tape.  Remove throw rugs and tripping hazards from the floors. BEDROOMS  Install night lights.  Make sure a bedside light is easy to reach.  Do not use oversized bedding.  Keep a telephone by your bedside.  Have a firm chair with side arms to use for getting dressed.  Remove throw rugs and tripping hazards from the floor. KITCHEN  Keep handles on pots and pans turned toward the center of the stove. Use back burners when possible.  Clean up spills quickly and allow time for drying.  Avoid walking on wet floors.  Avoid hot utensils and knives.  Position shelves so they are not too high or low.  Place commonly used objects within easy reach.  If necessary, use a sturdy step stool with a grab bar when reaching.  Keep electrical cables out of the way.  Do not use floor polish or wax that makes floors slippery. If you must use wax, use non-skid floor wax.  Remove throw rugs and tripping hazards from the floor. STAIRWAYS  Never leave objects on stairs.  Place handrails on both sides of stairways and use them. Fix any loose handrails. Make sure handrails on both sides of the stairways are as long as the stairs.  Check carpeting to make sure it is firmly attached along stairs. Make repairs to worn or loose carpet promptly.  Avoid placing throw rugs at the top or bottom of stairways, or properly secure the rug with carpet tape to prevent slippage. Get rid of throw rugs, if possible.  Have an electrician put in a light switch at the top and bottom of the stairs. OTHER FALL PREVENTION TIPS  Wear low-heel or rubber-soled shoes that are supportive and fit well. Wear closed toe shoes.  When using a stepladder, make sure it is fully opened and both spreaders are firmly locked. Do not climb a  closed stepladder.  Add color or contrast paint or tape to grab bars and handrails in your home. Place contrasting color strips on first and last steps.  Learn and use mobility aids as needed. Install an electrical emergency response system.  Turn on lights to avoid dark areas. Replace light bulbs that burn out immediately. Get light switches that glow.  Arrange furniture to create clear pathways. Keep furniture in the same place.  Firmly attach carpet with non-skid or double-sided tape.  Eliminate uneven floor surfaces.  Select a carpet pattern that does not visually hide the edge of steps.  Be aware of all  pets. OTHER HOME SAFETY TIPS  Set the water temperature for 120 F (48.8 C).  Keep emergency numbers on or near the telephone.  Keep smoke detectors on every level of the home and near sleeping areas. Document Released: 12/28/2001 Document Revised: 07/09/2011 Document Reviewed: 03/29/2011 [facility] Patient Information 2015 [place], [place]. This information is not intended to replace advice given to you by your health care provider. Make sure you discuss any questions you have with your health care provider.     Continue with personal trainer and cardio at gym.   Do exercises from therapy daily. Ambulate at least 5 times a week working on your best posture and intensity of movement.

## 2014-05-04 NOTE — Therapy (Signed)
[facility] [address] [place], [place], [address] Phone: [phone]   Fax:  [phone]  Speech Language Pathology Treatment  Patient Details  Name: Kyle Cameron MRN: [id number] Date of Birth: 1926-05-27 Referring Provider:  Seward Carol, MD  Encounter Date: 05/04/2014      End of Session - 05/04/14 0819    Visit Number 14   Number of Visits 16   Date for SLP Re-Evaluation 05/14/14   SLP Start Time 0806   SLP Stop Time  0846   SLP Time Calculation (min) 40 min   Activity Tolerance Patient tolerated treatment well      Past Medical History  Diagnosis Date  . Hypertension   . High cholesterol   . Prostate atrophy   . GERD (gastroesophageal reflux disease)   . History of diverticulitis of colon   . Benign enlargement of prostate   . Hypothyroidism   . History of renal calculi   . Parkinson's disease   . Renal calculi   . CVA (cerebral infarction)     Past Surgical History  Procedure Laterality Date  . Cholecystectomy  02/04/2012    Procedure: LAPAROSCOPIC CHOLECYSTECTOMY WITH INTRAOPERATIVE CHOLANGIOGRAM;  Surgeon: Merrie Roof, MD;  Location: [facility];  Service: General;  Laterality: N/A;  . Lithotripsy      renal calculi  . Tonsillectomy    . Cataract extraction, bilateral      There were no vitals filed for this visit.  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      Subjective Assessment - 05/03/14 0856    Subjective Pt fell Sunday night. Reports no lingering damage/pain today.               ADULT SLP TREATMENT - 05/04/14 0812    General Information   Behavior/Cognition Alert;Cooperative;Pleasant mood   Treatment Provided   Treatment provided Cognitive-Linquistic   Pain Assessment   Pain Assessment No/denies pain   Cognitive-Linquistic Treatment   Treatment focused on Aphasia;Dysarthria   Skilled Treatment Entered room with fast rate,  misarticulations and aphasic speech. Pt's overall intelligibility was approx 85-90% given these errors. SLP provided usual min-mod cues during this time. In structured speech tasks pt req'd min-mod A occasionally.     Assessment / Recommendations / Plan   Plan Continue with current plan of care   Progression Toward Goals   Progression toward goals Progressing toward goals          SLP Education - 05/03/14 0935    Education provided Yes   Education Details slow rate, progress thus far in therapy   Person(s) Educated Patient   Methods Explanation   Comprehension Verbalized understanding  demo'd understanding that if therapy ended today it would not be cont due to limited progress'          SLP Short Term Goals - 04/21/14 1143    SLP SHORT TERM GOAL #1   Title pt will repeat sentences of 10 words 80% success with rare min A   Status Not Met   SLP SHORT TERM GOAL #2   Title pt will use strategies for verbal expresion in simple conversation with occasional min-mod A   Status Not Met   SLP SHORT TERM GOAL #3   Title pt will generate sentence responses 80% success with rare min A   Status Not Met          SLP Long Term Goals - 05/04/14 0845    SLP  LONG TERM GOAL #1   Title pt will increase intelligibility to 95% in 3 minutes simple conversation with rare min A   Time 2   Period Weeks   Status Revised   SLP LONG TERM GOAL #2   Title pt will demo compensations for verbal expression in 5 minutes simple conversation with usual min A   Time 2   Period Weeks   Status Revised   SLP LONG TERM GOAL #3   Title pt will maintain loud /a/ for average 75dB over 4 sessions   Time 2   Period Weeks   Status Revised          Plan - 05/04/14 0844    Clinical Impression Statement occasional to usual cues needed today. SLP quesitons whether or not fatigue plays a role in pt's performance, as pt did better today and ST was the first of three therapies.   Speech Therapy Frequency 2x /  week   Duration 2 weeks   Treatment/Interventions Language facilitation;Functional tasks;Cueing hierarchy;Patient/family education;SLP instruction and feedback;Compensatory techniques;Internal/external aids   Potential to Achieve Goals Fair   Potential Considerations Co-morbidities;Severity of impairments        Problem List Patient Active Problem List   Diagnosis Date Noted  . Discoloration of skin of foot 03/15/2014  . PD (Parkinson's disease) 03/15/2014  . 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 Essential hypertension 03/15/2014  . HLD (hyperlipidemia) 03/15/2014  . Cough   . LV (left ventricular) mural thrombus   . CVA (cerebral infarction) 01/18/2014  . Imbalance 01/18/2014  . Slurring of speech   . Abnormality of gait 03/12/2012  . Paralysis agitans 03/12/2012  . Acute cholecystitis 02/04/2012  . Hypertension 02/04/2012  . Hyperlipidemia 02/04/2012    [facility], SLP 05/04/2014, 8:47 AM  [facility] [address] [place], [place], [address] Phone: [phone]   Fax:  [phone]

## 2014-05-04 NOTE — Therapy (Signed)
[facility] [address] [place], [place], [address] Phone: [phone]   Fax:  [phone]  Occupational Therapy Treatment  Patient Details  Name: Kyle Cameron MRN: [id number] Date of Birth: 1926-03-23 Referring Provider:  Seward Carol, MD  Encounter Date: 05/04/2014      OT End of Session - 05/04/14 0853    Visit Number 12   Number of Visits 17   Authorization Type 1Medicare, 2AARP, G-code needed   Authorization - Visit Number 12   Authorization - Number of Visits 20   OT Start Time 0849   OT Stop Time 0930   OT Time Calculation (min) 41 min      Past Medical History  Diagnosis Date  . Hypertension   . High cholesterol   . Prostate atrophy   . GERD (gastroesophageal reflux disease)   . History of diverticulitis of colon   . Benign enlargement of prostate   . Hypothyroidism   . History of renal calculi   . Parkinson's disease   . Renal calculi   . CVA (cerebral infarction)     Past Surgical History  Procedure Laterality Date  . Cholecystectomy  02/04/2012    Procedure: LAPAROSCOPIC CHOLECYSTECTOMY WITH INTRAOPERATIVE CHOLANGIOGRAM;  Surgeon: Merrie Roof, MD;  Location: [facility];  Service: General;  Laterality: N/A;  . Lithotripsy      renal calculi  . Tonsillectomy    . Cataract extraction, bilateral      There were no vitals filed for this visit.  Visit Diagnosis:  Lack of coordination  Rigidity  Decreased functional mobility and endurance  Generalized muscle weakness      Subjective Assessment - 05/04/14 0852    Pertinent History Pt with PD (diagnosed 3 years ago) with 2 CVAs 12/15 followed by home health therapies.  Pt reports PD worse after CVA.  Pt reports increased difficulty with speech, R hand, and walking.   Patient Stated Goals improve walking, R hand use, speech   Currently in Pain? No/denies   Multiple Pain Sites No      Treatment: Arm bike x 6 mins level 1 for  conditioning, pt was able to maintain 35-40 + RPM. Dynamic step and reach to place coins in targets with PWR! Hands and placing various sized pegs in semi-circle with min v.c./ difficulty with dynamic standing/ reaching, close supervision. (Mod difficulty with smaller pegs and coins, min v.c to slow down)                        OT Short Term Goals - 05/03/14 0947    OT SHORT TERM GOAL #1   Title Pt will be independent with updated HEP.--due 04/18/14   Time 4   Period Weeks   Status On-going  04/19/14:  continues to need cues for timing and needs updates   OT SHORT TERM GOAL #2   Title Pt will improve coordination as shown by completing 9-hole peg test in 54min or less with dominant R hand.--due 04/18/14   Baseline 140.93sec   Time 4   Period Weeks   Status Achieved  04/19/14:  95.34 sec with multiple drops   OT SHORT TERM GOAL #3   Title Pt will be able to write at least 3 sentences with at least 50% legibility.--due 04/18/14   Baseline 25%   Time 4   Period Weeks   Status On-going  25-50%   OT SHORT TERM GOAL #4   Title  Pt will improve ease with eating as shown by completing PPT#2 in less than 32sec--due 04/18/14   Baseline 40.41   Time 4   Period Weeks   Status Achieved  04/19/14:  14.84sec   OT SHORT TERM GOAL #5   Title Pt will improve dressing ability as shown by improving time on PPT#4 by at least 5sec.--due 04/18/14   Baseline 04/21/14-26.52 secs, not met   Time 4   Period Weeks   Status On-going           OT Long Term Goals - 05/03/14 0942    OT LONG TERM GOAL #1   Title Pt will verbalize understanidng of adaptive strategies for ADLs/IADLs prn to increase ease/safety.--due 05/19/14   Time 8   Period Weeks   Status New   OT LONG TERM GOAL #2   Title Pt will improve coordination as shown by completing 9-hole peg test in 45 sec or less with dominant R hand.--due 05/19/14   Time 8   Period Weeks   Status Revised  Met 04/27/14:  55.93   OT LONG TERM  GOAL #3   Title Pt will be able to write at least 3 sentences with at least 75% legibility.--due 05/19/14   Baseline 25%   Time 8   Period Weeks   Status New   OT LONG TERM GOAL #4   Title Pt will improve ease with eating as shown by completing PPT#2 in less than 25sec--due 05/19/14   Baseline 40.41   Time 8   Period Weeks   Status Achieved  14.18sec on 04/19/14   OT LONG TERM GOAL #5   Title Pt will improve BUE functional reaching/coordination for ADLs as shown by improving score on box and blocks test by at least 5.--due 05/19/14   Baseline 39 bilaterally   Time 8   Period Weeks   Status New   OT LONG TERM GOAL #6   Title Pt will perform simple-mod complex meal prep mod I.--due 05/19/14   Time 8   Period Weeks   Status Achieved  per pt report 05/03/14               Plan - 05/04/14 0853    Clinical Impression Statement Pt is progressing towards goals for coordination and RUE functional use.   Plan continue with functional coordination and timing    OT Home Exercise Plan HEP issued:  coordination, PWR! modified quadraped issued 04/21/14   Consulted and Agree with Plan of Care Patient        Problem List Patient Active Problem List   Diagnosis Date Noted  . Discoloration of skin of foot 03/15/2014  . PD (Parkinson's disease) 03/15/2014  . 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 Essential hypertension 03/15/2014  . HLD (hyperlipidemia) 03/15/2014  . Cough   . LV (left ventricular) mural thrombus   . CVA (cerebral infarction) 01/18/2014  . Imbalance 01/18/2014  . Slurring of speech   . Abnormality of gait 03/12/2012  . Paralysis agitans 03/12/2012  . Acute cholecystitis 02/04/2012  . Hypertension 02/04/2012  . Hyperlipidemia 02/04/2012    RINE,KATHRYN 05/04/2014, 8:56 AM Theone Murdoch, OTR/L [phone] Phone: [phone] 8:57 AM 05/04/2014 [facility] [address] [place], [place], [address] Phone: [phone]   Fax:  [phone]

## 2014-05-05 NOTE — Therapy (Signed)
[facility] [address] [place], [place], [address] Phone: [phone]   Fax:  [phone]  Physical Therapy Treatment  Patient Details  Name: Kyle Cameron MRN: [id number] Date of Birth: Apr 11, 1926 Referring Provider:  Seward Carol, MD  Encounter Date: 05/04/2014      PT End of Session - 05/05/14 1054    Visit Number 14   Number of Visits 17   Date for PT Re-Evaluation 05/15/14   Authorization Type Medicare G-code every 10th visit   PT Start Time 0934   PT Stop Time 1015   PT Time Calculation (min) 41 min   Equipment Utilized During Treatment Gait belt   Activity Tolerance Patient tolerated treatment well   Behavior During Therapy [facility] for tasks assessed/performed      Past Medical History  Diagnosis Date  . Hypertension   . High cholesterol   . Prostate atrophy   . GERD (gastroesophageal reflux disease)   . History of diverticulitis of colon   . Benign enlargement of prostate   . Hypothyroidism   . History of renal calculi   . Parkinson's disease   . Renal calculi   . CVA (cerebral infarction)     Past Surgical History  Procedure Laterality Date  . Cholecystectomy  02/04/2012    Procedure: LAPAROSCOPIC CHOLECYSTECTOMY WITH INTRAOPERATIVE CHOLANGIOGRAM;  Surgeon: Merrie Roof, MD;  Location: [facility];  Service: General;  Laterality: N/A;  . Lithotripsy      renal calculi  . Tonsillectomy    . Cataract extraction, bilateral      There were no vitals filed for this visit.  Visit Diagnosis:  Abnormality of gait  Decreased functional mobility and endurance      Subjective Assessment - 05/05/14 1048    Subjective Had one fall making in bedroom pulling covers up.  Denies injury.  Still going to trainer and will continue 3x/week.  Feels he is ready to d/c from PT.   Pertinent History CVA 12/26/13 and 01/18/14, Parkinson's disease   Patient Stated Goals Pt's goal for therapy is to help with the  initiation of walking.   Currently in Pain? No/denies            [facility] PT Assessment - 05/05/14 1053    Functional Gait  Assessment   Gait assessed  Yes   Gait Level Surface Walks 20 ft in less than 7 sec but greater than 5.5 sec, uses assistive device, slower speed, mild gait deviations, or deviates 6-10 in outside of the 12 in walkway width.   Change in Gait Speed Able to smoothly change walking speed without loss of balance or gait deviation. Deviate no more than 6 in outside of the 12 in walkway width.   Gait with Horizontal Head Turns Performs head turns smoothly with no change in gait. Deviates no more than 6 in outside 12 in walkway width   Gait with Vertical Head Turns Performs head turns with no change in gait. Deviates no more than 6 in outside 12 in walkway width.   Gait and Pivot Turn Turns slowly, requires verbal cueing, or requires several small steps to catch balance following turn and stop   Step Over Obstacle Is able to step over one shoe box (4.5 in total height) without changing gait speed. No evidence of imbalance.   Gait with Narrow Base of Support Ambulates less than 4 steps heel to toe or cannot perform without assistance.   Gait with Eyes Closed Cannot  walk 20 ft without assistance, severe gait deviations or imbalance, deviates greater than 15 in outside 12 in walkway width or will not attempt task.   Ambulating Backwards Cannot walk 20 ft without assistance, severe gait deviations or imbalance, deviates greater than 15 in outside 12 in walkway width or will not attempt task.   Steps Alternating feet, must use rail.   Total Score 16                   [facility] Adult PT Treatment/Exercise - 05/05/14 1049    Transfers   Transfers Sit to Stand;Stand to Sit   Sit to Stand Five times sit to stand  12.12 seconds without posterior lean   Sit to Stand Details (indicate cue type and reason) repeated x 10 without posterior lean;cues initially for technique   Stand  to Sit 7: Independent   Five time sit to stand comments  without posterior lean   Ambulation/Gait   Ambulation/Gait Yes   Ambulation/Gait Assistance 6: Modified independent (Device/Increase time)   Ambulation/Gait Assistance Details Festinating/freezing with initiating gait and turns   Ambulation Distance (Feet) 120 Feet  multiple times during session   Assistive device Straight cane   Gait Pattern Decreased step length - right;Decreased step length - left;Narrow base of support;Poor foot clearance - left;Poor foot clearance - right   Ambulation Surface Level;Indoor   Timed Up and Go Test   TUG Normal TUG   Normal TUG (seconds) 12.87  took 23.34 seconds on first attempt secondary to freezing                PT Education - 05/05/14 1053    Education provided Yes   Education Details Fall prevention, Community fitness and optimal fitness, goals met/unmet   Person(s) Educated Patient   Methods Explanation;Handout   Comprehension Verbalized understanding          PT Short Term Goals - 04/13/14 1236    PT SHORT TERM GOAL #1   Title Pt will be independent with HEP for improved transfers, balance, gait.   Time 4   Period Weeks   Status Not Met   PT SHORT TERM GOAL #2   Title Pt will improve 5x sit<>stand to less than or equal to 14.5 seconds with no posterior lean for improved transfer efficiency and safety.   Status Achieved   PT SHORT TERM GOAL #3   Title Pt will improve Functional Gait Assessment score to at least 15/30 for decreased fall risk.   Status Not Met   PT SHORT TERM GOAL #4   Title Pt will improve Timed UP and Go score to less than or equal to 16 seconds for decreased fall risk.   Status Achieved   PT SHORT TERM GOAL #5   Title Pt will verbalize/demonstrate tips to reduce freezing episodes with gait.   Time 4   Period Weeks   Status Partially Met  verbalizes these but doesn't demonstrate them           PT Long Term Goals - 05/05/14 1056    PT LONG  TERM GOAL #1   Title Pt will verbalize/demonstrate understanding of fall prevention techniques within home environment. (Target 05/15/14)   Status Achieved   PT LONG TERM GOAL #2   Title perform at least 8 of 10 reps of sit<>stand transfers from <18 inch surfaces, with no posterior lean, for improved efficiency and safety with transfers.   Status Achieved   PT LONG TERM GOAL #3  Title Pt will improve Functional Gait Assessment to at least 20/30 for decreased fall risk.   Status Not Met   PT LONG TERM GOAL #4   Title Pt will improve TUG score to less than or equal to 13.5 seconds for decreased fall risk.   Status Achieved   PT LONG TERM GOAL #5   Title Pt will verbalize plans for continued community fitness upon D/C from PT.   Status Achieved               Plan - 06/01/2014 1054    Clinical Impression Statement Pt feels he is ready for d/c.  Pt met goals 1,2,4,5.  Goal 3 unmet.   Pt will benefit from skilled therapeutic intervention in order to improve on the following deficits Abnormal gait;Decreased balance;Decreased safety awareness;Decreased coordination;Decreased mobility;Decreased strength;Decreased knowledge of use of DME   PT Next Visit Plan Discharge today from PT.   Consulted and Agree with Plan of Care Patient          G-Codes - 06-01-2014 1057    Functional Assessment Tool Used Functional Gait Assessment 16/30;5x sit<>stand 12.12 seconds;TUG 12.87 seconds      Problem List Patient Active Problem List   Diagnosis Date Noted  . Discoloration of skin of foot 03/15/2014  . PD (Parkinson's disease) 03/15/2014  . 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 Essential hypertension 03/15/2014  . HLD (hyperlipidemia) 03/15/2014  . Cough   . LV (left ventricular) mural thrombus   . CVA (cerebral infarction) 01/18/2014  . Imbalance 01/18/2014  . Slurring of speech   . Abnormality of gait 03/12/2012  . Paralysis agitans 03/12/2012  . Acute  cholecystitis 02/04/2012  . Hypertension 02/04/2012  . Hyperlipidemia 02/04/2012    [name] 2014/06/01, 10:59 AM  [facility] [address] [place], [place], [address] Phone: [phone]   Fax:  [place], [place] Jun 01, 2014 10:59 AM Phone: [phone] Fax: [phone]

## 2014-05-09 ENCOUNTER — Ambulatory Visit: Payer: Medicare Other | Admitting: Occupational Therapy

## 2014-05-09 ENCOUNTER — Other Ambulatory Visit: Payer: Self-pay | Admitting: Neurology

## 2014-05-09 ENCOUNTER — Ambulatory Visit: Payer: Medicare Other

## 2014-05-09 DIAGNOSIS — G20 Parkinson's disease: Secondary | ICD-10-CM

## 2014-05-09 DIAGNOSIS — M6281 Muscle weakness (generalized): Secondary | ICD-10-CM

## 2014-05-09 DIAGNOSIS — R279 Unspecified lack of coordination: Secondary | ICD-10-CM

## 2014-05-09 DIAGNOSIS — Z5189 Encounter for other specified aftercare: Secondary | ICD-10-CM | POA: Diagnosis not present

## 2014-05-09 DIAGNOSIS — R490 Dysphonia: Secondary | ICD-10-CM

## 2014-05-09 DIAGNOSIS — R4701 Aphasia: Secondary | ICD-10-CM

## 2014-05-09 DIAGNOSIS — R29898 Other symptoms and signs involving the musculoskeletal system: Secondary | ICD-10-CM

## 2014-05-09 DIAGNOSIS — R6889 Other general symptoms and signs: Secondary | ICD-10-CM

## 2014-05-09 DIAGNOSIS — Z7409 Other reduced mobility: Secondary | ICD-10-CM

## 2014-05-09 NOTE — Patient Instructions (Signed)
  Please complete the assigned speech therapy homework prior to your next session.  

## 2014-05-09 NOTE — Therapy (Signed)
[facility] [address] [place], [place], [address] Phone: [phone]   Fax:  [phone]  Occupational Therapy Treatment  Patient Details  Name: Kyle Cameron MRN: [id number] Date of Birth: [birth year] Referring Provider:  Seward Carol, MD  Encounter Date: 05/09/2014      OT End of Session - 05/09/14 0941    Visit Number 13   Number of Visits 17   Date for OT Re-Evaluation 05/19/14   Authorization Type 1Medicare, 2[employer]ARP, G-code needed   Authorization - Visit Number 13   Authorization - Number of Visits 20   OT Start Time 7124   OT Stop Time 1015   OT Time Calculation (min) 40 min   Activity Tolerance Patient tolerated treatment well      Past Medical History  Diagnosis Date  . Hypertension   . High cholesterol   . Prostate atrophy   . GERD (gastroesophageal reflux disease)   . History of diverticulitis of colon   . Benign enlargement of prostate   . Hypothyroidism   . History of renal calculi   . Parkinson's disease   . Renal calculi   . CVA (cerebral infarction)     Past Surgical History  Procedure Laterality Date  . Cholecystectomy  02/04/2012    Procedure: LAPAROSCOPIC CHOLECYSTECTOMY WITH INTRAOPERATIVE CHOLANGIOGRAM;  Surgeon: Merrie Roof, MD;  Location: [facility];  Service: General;  Laterality: N/A;  . Lithotripsy      renal calculi  . Tonsillectomy    . Cataract extraction, bilateral      There were no vitals filed for this visit.  Visit Diagnosis:  Lack of coordination  Hypokinesia  Rigidity  Decreased functional mobility and endurance  Generalized muscle weakness      Subjective Assessment - 05/09/14 0939    Subjective  was unsteady this weekend   Pertinent History Pt with PD (diagnosed 3 years ago) with 2 CVAs 12/15 followed by home health therapies.  Pt reports PD worse after CVA.  Pt reports increased difficulty with speech, R hand, and walking.   Currently in Pain?  No/denies                    OT Treatments/Exercises (OP) - 05/09/14 0001    ADLs   Writing Practiced writing letters in blocks with focus on filling space to write big with good success, but omitted letters, wrote one backwards and needed mod cueing for generating alphabet (?due to apraxia/aphasia).  Then copying sentences with focus/cueing for big size and to slow down with good size and 95% legibility.   Neurological Re-education Exercises   Other Exercises 1 In sitting, tossing/catching scarves with each hand with movements in various place including trunk rotation,reaching across body and laterally with wt. shift and min-mod cues for big movements/PWR! hands.   Other Exercises 2 In standing, "writing" letters in air with RUE to focus big movements and incorporate wt. shift/balance with CGA and min-mod cues.  Mod difficulty with generating letters when going in alphabetical order   Reciprocal Movements Arm bike x6 min (forward/backward) with min cues to maintain >50rpms, pt maintained 45-50 rpms forward, but 40-46rpms backwards   Functional Reaching Activities   High Level Functional reaching to place medium pegs in vertical pegboard with RUE for increased coordination and set-up/targets for big movements with min difficulty/drops                  OT Short Term Goals - 05/03/14 5809  OT SHORT TERM GOAL #1   Title Pt will be independent with updated HEP.--due 04/18/14   Time 4   Period Weeks   Status On-going  04/19/14:  continues to need cues for timing and needs updates   OT SHORT TERM GOAL #2   Title Pt will improve coordination as shown by completing 9-hole peg test in or less with dominant R hand.--due 04/18/14   Baseline 140.93sec   Time 4   Period Weeks   Status Achieved  04/19/14:  95.34 sec with multiple drops   OT SHORT TERM GOAL #3   Title Pt will be able to write at least 3 sentences with at least 50% legibility.--due 04/18/14   Baseline 25%    Time 4   Period Weeks   Status On-going  25-50%   OT SHORT TERM GOAL #4   Title Pt will improve ease with eating as shown by completing PPT#2 in less than 32sec--due 04/18/14   Baseline 40.41   Time 4   Period Weeks   Status Achieved  04/19/14:  14.84sec   OT SHORT TERM GOAL #5   Title Pt will improve dressing ability as shown by improving time on PPT#4 by at least 5sec.--due 04/18/14   Baseline 04/21/14-26.52 secs, not met   Time 4   Period Weeks   Status On-going           OT Long Term Goals - 05/03/14 0942    OT LONG TERM GOAL #1   Title Pt will verbalize understanidng of adaptive strategies for ADLs/IADLs prn to increase ease/safety.--due 05/19/14   Time 8   Period Weeks   Status New   OT LONG TERM GOAL #2   Title Pt will improve coordination as shown by completing 9-hole peg test in 45 sec or less with dominant R hand.--due 05/19/14   Time 8   Period Weeks   Status Revised  Met 04/27/14:  55.93   OT LONG TERM GOAL #3   Title Pt will be able to write at least 3 sentences with at least 75% legibility.--due 05/19/14   Baseline 25%   Time 8   Period Weeks   Status New   OT LONG TERM GOAL #4   Title Pt will improve ease with eating as shown by completing PPT#2 in less than 25sec--due 05/19/14   Baseline 40.41   Time 8   Period Weeks   Status Achieved  14.18sec on 04/19/14   OT LONG TERM GOAL #5   Title Pt will improve BUE functional reaching/coordination for ADLs as shown by improving score on box and blocks test by at least 5.--due 05/19/14   Baseline 39 bilaterally   Time 8   Period Weeks   Status New   OT LONG TERM GOAL #6   Title Pt will perform simple-mod complex meal prep mod I.--due 05/19/14   Time 8   Period Weeks   Status Achieved  per pt report 05/03/14               Plan - 05/09/14 1034    Clinical Impression Statement Pt is progressing towards goals with improved writing size/legibility today and improve coordination with functional reaching.    Plan strategies/big movements for ADLs, discuss ?d/c next week   OT Home Exercise Plan HEP issued:  coordination, PWR! modified quadraped issued 04/21/14   Consulted and Agree with Plan of Care Patient        Problem List Patient Active Problem List  Diagnosis Date Noted  . Discoloration of skin of foot 03/15/2014  . PD (Parkinson's disease) 03/15/2014  . 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 Essential hypertension 03/15/2014  . HLD (hyperlipidemia) 03/15/2014  . Cough   . LV (left ventricular) mural thrombus   . CVA (cerebral infarction) 01/18/2014  . Imbalance 01/18/2014  . Slurring of speech   . Abnormality of gait 03/12/2012  . Paralysis agitans 03/12/2012  . Acute cholecystitis 02/04/2012  . Hypertension 02/04/2012  . Hyperlipidemia 02/04/2012    [facility] 05/09/2014, 10:37 AM  [facility] [address] [place], [place], [address] Phone: [phone]   Fax:  [facility], OTR/L 05/09/2014 10:37 AM

## 2014-05-09 NOTE — Therapy (Signed)
[facility] [address] [place], [place], [address] Phone: [phone]   Fax:  [phone]  Speech Language Pathology Treatment  Patient Details  Name: Kyle Cameron MRN: [id number] Date of Birth: February 09, 1926 Referring Provider:  Seward Carol, MD  Encounter Date: 05/09/2014      End of Session - 05/09/14 0930    Visit Number 15   Number of Visits 16   Date for SLP Re-Evaluation 05/14/14   SLP Start Time 0847   SLP Stop Time  0931   SLP Time Calculation (min) 44 min   Activity Tolerance Patient tolerated treatment well      Past Medical History  Diagnosis Date  . Hypertension   . High cholesterol   . Prostate atrophy   . GERD (gastroesophageal reflux disease)   . History of diverticulitis of colon   . Benign enlargement of prostate   . Hypothyroidism   . History of renal calculi   . Parkinson's disease   . Renal calculi   . CVA (cerebral infarction)     Past Surgical History  Procedure Laterality Date  . Cholecystectomy  02/04/2012    Procedure: LAPAROSCOPIC CHOLECYSTECTOMY WITH INTRAOPERATIVE CHOLANGIOGRAM;  Surgeon: Merrie Roof, MD;  Location: [facility];  Service: General;  Laterality: N/A;  . Lithotripsy      renal calculi  . Tonsillectomy    . Cataract extraction, bilateral      There were no vitals filed for this visit.  Visit Diagnosis: Expressive aphasia  Hypokinetic Parkinsonian dysphonia      Subjective Assessment - 05/09/14 0857    Subjective Pt went to party last night - reports "getting stuck" at the end of the buffet. Pt reports people understood what he had to say. With fast rate and abandonments in speech entering therapy room.               ADULT SLP TREATMENT - 05/09/14 0856    General Information   Behavior/Cognition Alert;Cooperative;Pleasant mood   Treatment Provided   Treatment provided Cognitive-Linquistic   Pain Assessment   Pain Assessment No/denies pain    Cognitive-Linquistic Treatment   Treatment focused on Aphasia;Dysarthria   Skilled Treatment Entered room with fast rate, misarticulations and aphasic speech today, not unlike last week. Pt's overall intelligibility was approx 80-85% given these errors. Sentence level structured tasks today (dual meanings, solutions to problems) completed requiring mod A usually from SLP, faded to min-mod A usually. Intelligibility with these structured tasks 85-90%. In a third task (dual meanings to sentences) pt with mod A usually needed. SLP/pt discussed options following next session - whether to d/c or have pt cont to complete tasks on his own at home.    Assessment / Recommendations / Plan   Plan Continue with current plan of care   Progression Toward Goals   Progression toward goals Not progressing toward goals (comment)  If status essentially unchanged next session, d/c. Pt aware.          SLP Education - 05/09/14 0930    Education provided Yes   Education Details plan for after next session (Wednesday, according to pt)   Person(s) Educated Patient   Methods Explanation   Comprehension Verbalized understanding          SLP Short Term Goals - 04/21/14 1143    SLP SHORT TERM GOAL #1   Title pt will repeat sentences of 10 words 80% success with rare min A   Status Not Met  SLP SHORT TERM GOAL #2   Title pt will use strategies for verbal expresion in simple conversation with occasional min-mod A   Status Not Met   SLP SHORT TERM GOAL #3   Title pt will generate sentence responses 80% success with rare min A   Status Not Met          SLP Long Term Goals - 05/09/14 0933    SLP LONG TERM GOAL #1   Title pt will increase intelligibility to 95% in 3 minutes simple conversation with rare min A   Time 1   Period Weeks   Status Revised   SLP LONG TERM GOAL #2   Title pt will demo compensations for verbal expression in 5 minutes simple conversation with usual min A   Time 1   Period  Weeks   Status Revised   SLP LONG TERM GOAL #3   Title pt will maintain loud /a/ for average 75dB over 4 sessions   Time 1   Period Weeks   Status Deferred          Plan - 05/09/14 0931    Clinical Impression Statement Pt performed much like previous weeks in therapy today, and ST was first therapy session scheduled. If pt does not show measuraable progress next session, discharge very likely.   Speech Therapy Frequency 2x / week   Duration 1 week   Treatment/Interventions Language facilitation;Functional tasks;Cueing hierarchy;Patient/family education;SLP instruction and feedback;Compensatory techniques;Internal/external aids   Potential to Achieve Goals Fair   Potential Considerations Co-morbidities;Severity of impairments        Problem List Patient Active Problem List   Diagnosis Date Noted  . Discoloration of skin of foot 03/15/2014  . PD (Parkinson's disease) 03/15/2014  . 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 Essential hypertension 03/15/2014  . HLD (hyperlipidemia) 03/15/2014  . Cough   . LV (left ventricular) mural thrombus   . CVA (cerebral infarction) 01/18/2014  . Imbalance 01/18/2014  . Slurring of speech   . Abnormality of gait 03/12/2012  . Paralysis agitans 03/12/2012  . Acute cholecystitis 02/04/2012  . Hypertension 02/04/2012  . Hyperlipidemia 02/04/2012    [facility], SLP 05/09/2014, 9:34 AM  [facility] [address] [place], [place], [address] Phone: [phone]   Fax:  [phone]

## 2014-05-11 ENCOUNTER — Ambulatory Visit: Payer: Medicare Other

## 2014-05-11 ENCOUNTER — Ambulatory Visit: Payer: Medicare Other | Admitting: Occupational Therapy

## 2014-05-11 DIAGNOSIS — Z7409 Other reduced mobility: Secondary | ICD-10-CM

## 2014-05-11 DIAGNOSIS — R4701 Aphasia: Secondary | ICD-10-CM

## 2014-05-11 DIAGNOSIS — R279 Unspecified lack of coordination: Secondary | ICD-10-CM

## 2014-05-11 DIAGNOSIS — Z5189 Encounter for other specified aftercare: Secondary | ICD-10-CM | POA: Diagnosis not present

## 2014-05-11 DIAGNOSIS — R490 Dysphonia: Secondary | ICD-10-CM

## 2014-05-11 DIAGNOSIS — R6889 Other general symptoms and signs: Secondary | ICD-10-CM

## 2014-05-11 DIAGNOSIS — G20 Parkinson's disease: Secondary | ICD-10-CM

## 2014-05-11 DIAGNOSIS — R29898 Other symptoms and signs involving the musculoskeletal system: Secondary | ICD-10-CM

## 2014-05-11 DIAGNOSIS — M6281 Muscle weakness (generalized): Secondary | ICD-10-CM

## 2014-05-11 NOTE — Addendum Note (Signed)
Addended by: Garald Balding B on: 05/11/2014 01:58 PM   Modules accepted: Orders

## 2014-05-11 NOTE — Therapy (Addendum)
[facility] [address] [place], [place], [address] Phone: [phone]   Fax:  [phone]  Speech Language Pathology Treatment  Patient Details  Name: Kyle Cameron MRN: [id number] Date of Birth: 10-06-1926 Referring Provider:  Seward Carol, MD  Encounter Date: 05/11/2014      End of Session - 05/11/14 0957    Visit Number 16   Number of Visits 16   Date for SLP Re-Evaluation 05/14/14   SLP Start Time 0848   SLP Stop Time  0934   SLP Time Calculation (min) 46 min   Activity Tolerance Patient tolerated treatment well      Past Medical History  Diagnosis Date  . Hypertension   . High cholesterol   . Prostate atrophy   . GERD (gastroesophageal reflux disease)   . History of diverticulitis of colon   . Benign enlargement of prostate   . Hypothyroidism   . History of renal calculi   . Parkinson's disease   . Renal calculi   . CVA (cerebral infarction)     Past Surgical History  Procedure Laterality Date  . Cholecystectomy  02/04/2012    Procedure: LAPAROSCOPIC CHOLECYSTECTOMY WITH INTRAOPERATIVE CHOLANGIOGRAM;  Surgeon: Merrie Roof, MD;  Location: [facility];  Service: General;  Laterality: N/A;  . Lithotripsy      renal calculi  . Tonsillectomy    . Cataract extraction, bilateral      There were no vitals filed for this visit.  Visit Diagnosis: Expressive aphasia  Hypokinetic Parkinsonian dysphonia      Subjective Assessment - 05/11/14 0851    Subjective Pt reports no pain today, no falls.   Patient is accompained by: --  [name], good friend   Currently in Pain? No/denies               ADULT SLP TREATMENT - 05/11/14 0921    General Information   Behavior/Cognition Alert;Cooperative;Pleasant mood   Treatment Provided   Treatment provided Cognitive-Linquistic   Cognitive-Linquistic Treatment   Treatment focused on Aphasia;Dysarthria   Skilled Treatment Structured tasks at  sentence level completed with mod A, usually. Pt cont with this cueing level over all sentence tasks today. Conversation req'd usual mod A for rate reduction/compensations.    Assessment / Recommendations / Plan   Plan Continue with current plan of care;Discharge SLP treatment due to (comment)  reached max potential at this time   Progression Toward Goals   Progression toward goals Not progressing toward goals (comment)            SLP Short Term Goals - 04/21/14 1143    SLP SHORT TERM GOAL #1   Title pt will repeat sentences of 10 words 80% success with rare min A   Status Not Met   SLP SHORT TERM GOAL #2   Title pt will use strategies for verbal expresion in simple conversation with occasional min-mod A   Status Not Met   SLP SHORT TERM GOAL #3   Title pt will generate sentence responses 80% success with rare min A   Status Not Met          SLP Long Term Goals - 05/11/14 1000    SLP LONG TERM GOAL #1   Title pt will increase intelligibility to 95% in 3 minutes simple conversation with rare min A   Time 1   Period Weeks   Status Not Met   SLP LONG TERM GOAL #2   Title pt will demo  compensations for verbal expression in 5 minutes simple conversation with usual min A   Time 1   Period Weeks   Status Not Met   SLP LONG TERM GOAL #3   Title pt will maintain loud /a/ for average 75dB over 4 sessions   Time 1   Period Weeks   Status Deferred  due to pt not meeting speech intelligibility goals          Plan - 05/16/14 1008    Clinical Impression Statement At this time, pt progress has been limited to the point that SLP believes pt will not see measurable progress if therapy were to continue for four more weeks. Discharge appropriate.   Treatment/Interventions Language facilitation;Functional tasks;Cueing hierarchy;Patient/family education;SLP instruction and feedback;Compensatory techniques;Internal/external aids   Potential Considerations Co-morbidities;Severity of  impairments          G-Codes - May 16, 2014 1028    Functional Assessment Tool Used clincal observation   Functional Limitations Spoken language expressive   Spoken Language Expression Goal Status [phone]) At least 20 percent but less than 40 percent impaired, limited or restricted   Spoken Language Expression Discharge Status [phone]) At least 20 percent but less than 40 percent impaired, limited or restricted      [place]  Visits from Start of Care: 16  Current functional level related to goals / functional outcomes: Pt saw minimal progress in speech-language therapy for aphasia (and possible apraxia of speech). He met 0/3 STGs in the expected time period of four weeks from onset of therapy, and 0/2 LTGs in the expected time frame of 8 weeks. A third goal of loud /a/ was not addressed due to lack of consistent, marked progress in speech intelligibility. Pt told SLP he was diligent in completing speech and language tasks at home between sessions. The pt was unable to maintain marked, consistent progress over 8 weeks of therapy.His Parkinson's Disease is very suspect as a contributing factor or the primary factor in pt's lack of progress.    Remaining deficits: Aphasia/apraxia, dysarthria.   Education / Equipment: Compensations for aphasia (and possible apraxia), specifically reduced speaking rate. Plan: Patient agrees to discharge.  Patient goals were not met. Patient is being discharged due to lack of progress.  ?????SLP provided pt with material to continue to practice at home.   A screening of pt's skills is recommended in 3-6 months. If decline is seen in pt's skills, he may benefit from therapy at that time.    Problem List Patient Active Problem List   Diagnosis Date Noted  . Discoloration of skin of foot 03/15/2014  . PD (Parkinson's disease) 03/15/2014  . 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 Essential hypertension  03/15/2014  . HLD (hyperlipidemia) 03/15/2014  . Cough   . LV (left ventricular) mural thrombus   . CVA (cerebral infarction) 01/18/2014  . Imbalance 01/18/2014  . Slurring of speech   . Abnormality of gait 03/12/2012  . Paralysis agitans 03/12/2012  . Acute cholecystitis 02/04/2012  . Hypertension 02/04/2012  . Hyperlipidemia 02/04/2012    Garald Balding, SLP 05/16/2014, 10:30 AM  [facility] [address] [place], [place], [address] Phone: [phone]   Fax:  [phone]

## 2014-05-11 NOTE — Patient Instructions (Addendum)
The totality of progress to this point (8 weeks worth of therapy) leads me to believe we will not see enough measurable success with slowing down your speech in the near future to continue in formal speech therapy.  I have given you some activities to do at home, in the meantime. We will have you return in 3-6 months for a screening of your abilities in OT, PT, and speech. If you have declined, we will likely request another course of therapy.  Another option for speech therapy at this point may be available at [facility]. Their phone number is [phone].

## 2014-05-12 NOTE — Therapy (Signed)
[facility] [address] [place], [place], [address] Phone: [phone]   Fax:  [phone]  Occupational Therapy Treatment  Patient Details  Name: Kyle Cameron MRN: [id number] Date of Birth: Sep 18, 1926 Referring Provider:  Seward Carol, MD  Encounter Date: 05/11/2014      OT End of Session - 05/12/14 1031    Visit Number 14   Number of Visits 17   Date for OT Re-Evaluation 05/19/14   Authorization Type 1Medicare, 2AARP, G-code needed   Authorization - Visit Number 14   Authorization - Number of Visits 20   OT Start Time 0940   OT Stop Time 1015   OT Time Calculation (min) 35 min   Activity Tolerance Patient tolerated treatment well   Behavior During Therapy [facility] for tasks assessed/performed      Past Medical History  Diagnosis Date  . Hypertension   . High cholesterol   . Prostate atrophy   . GERD (gastroesophageal reflux disease)   . History of diverticulitis of colon   . Benign enlargement of prostate   . Hypothyroidism   . History of renal calculi   . Parkinson's disease   . Renal calculi   . CVA (cerebral infarction)     Past Surgical History  Procedure Laterality Date  . Cholecystectomy  02/04/2012    Procedure: LAPAROSCOPIC CHOLECYSTECTOMY WITH INTRAOPERATIVE CHOLANGIOGRAM;  Surgeon: Merrie Roof, MD;  Location: [facility];  Service: General;  Laterality: N/A;  . Lithotripsy      renal calculi  . Tonsillectomy    . Cataract extraction, bilateral      There were no vitals filed for this visit.  Visit Diagnosis:  Lack of coordination  Rigidity  Hypokinesia  Generalized muscle weakness  Decreased functional mobility and endurance      Subjective Assessment - 05/12/14 0810    Pertinent History Pt with PD (diagnosed 3 years ago) with 2 CVAs 12/15 followed by home health therapies.  Pt reports PD worse after CVA.  Pt reports increased difficulty with speech, R hand, and walking.          Treatment: Pt's friend attended with pt today in anticipation of d/c. Therapist discussed importance of large amplitude movements and pt slowing down. Therapist instructed pt in use of button hook, he returned demonstration and was provided with information for purchase of buttonhook. Pt practiced donning/ doffing jacket with adpted technique/ large amplitude movements. Pt returned demonstration, and his friend verbalized understanding. Handwriting activity using foam grip. Pt demonstrates improved legibility and size when he slows down. Flipping playing cards with emphasis on big movements.                PWR [facility]) - 05/11/14 1013    PWR! exercises Moves in [place]! Up 20   PWR! Rock 20   PWR! Twist 20   PWR! Step 20   Comments modified quadraped, min v.c. for speed               OT Short Term Goals - 05/03/14 0947    OT SHORT TERM GOAL #1   Title Pt will be independent with updated HEP.--due 04/18/14   Time 4   Period Weeks   Status On-going  04/19/14:  continues to need cues for timing and needs updates   OT SHORT TERM GOAL #2   Title Pt will improve coordination as shown by completing 9-hole peg test in 58min or less with dominant R hand.--due  04/18/14   Baseline 140.93sec   Time 4   Period Weeks   Status Achieved  04/19/14:  95.34 sec with multiple drops   OT SHORT TERM GOAL #3   Title Pt will be able to write at least 3 sentences with at least 50% legibility.--due 04/18/14   Baseline 25%   Time 4   Period Weeks   Status On-going  25-50%   OT SHORT TERM GOAL #4   Title Pt will improve ease with eating as shown by completing PPT#2 in less than 32sec--due 04/18/14   Baseline 40.41   Time 4   Period Weeks   Status Achieved  04/19/14:  14.84sec   OT SHORT TERM GOAL #5   Title Pt will improve dressing ability as shown by improving time on PPT#4 by at least 5sec.--due 04/18/14   Baseline 04/21/14-26.52 secs, not met   Time 4   Period Weeks    Status On-going           OT Long Term Goals - 05/03/14 0942    OT LONG TERM GOAL #1   Title Pt will verbalize understanidng of adaptive strategies for ADLs/IADLs prn to increase ease/safety.--due 05/19/14   Time 8   Period Weeks   Status New   OT LONG TERM GOAL #2   Title Pt will improve coordination as shown by completing 9-hole peg test in 45 sec or less with dominant R hand.--due 05/19/14   Time 8   Period Weeks   Status Revised  Met 04/27/14:  55.93   OT LONG TERM GOAL #3   Title Pt will be able to write at least 3 sentences with at least 75% legibility.--due 05/19/14   Baseline 25%   Time 8   Period Weeks   Status New   OT LONG TERM GOAL #4   Title Pt will improve ease with eating as shown by completing PPT#2 in less than 25sec--due 05/19/14   Baseline 40.41   Time 8   Period Weeks   Status Achieved  14.18sec on 04/19/14   OT LONG TERM GOAL #5   Title Pt will improve BUE functional reaching/coordination for ADLs as shown by improving score on box and blocks test by at least 5.--due 05/19/14   Baseline 39 bilaterally   Time 8   Period Weeks   Status New   OT LONG TERM GOAL #6   Title Pt will perform simple-mod complex meal prep mod I.--due 05/19/14   Time 8   Period Weeks   Status Achieved  per pt report 05/03/14               Plan - 05/11/14 0943    Clinical Impression Statement Pt is progressing towards goals. Pt agrees with plans to discharge next week.   Plan reinforce large amplitude movments,check goals, anticipate d/c next week   G code!   OT Home Exercise Plan HEP issued:  coordination, PWR! modified quadraped issued 04/21/14        Problem List Patient Active Problem List   Diagnosis Date Noted  . Discoloration of skin of foot 03/15/2014  . PD (Parkinson's disease) 03/15/2014  . 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 Essential hypertension 03/15/2014  . HLD (hyperlipidemia) 03/15/2014  . Cough   . LV (left  ventricular) mural thrombus   . CVA (cerebral infarction) 01/18/2014  . Imbalance 01/18/2014  . Slurring of speech   . Abnormality of gait 03/12/2012  . Paralysis agitans 03/12/2012  .  Acute cholecystitis 02/04/2012  . Hypertension 02/04/2012  . Hyperlipidemia 02/04/2012    Lanisa Ishler 05/12/2014, 8:23 AM Theone Murdoch, OTR/L [phone] Phone: [phone] 8:23 AM 05/12/2014 [facility] [address] [place], [place], [address] Phone: [phone]   Fax:  [phone]

## 2014-05-16 ENCOUNTER — Ambulatory Visit: Payer: Medicare Other | Admitting: Occupational Therapy

## 2014-05-16 ENCOUNTER — Encounter: Payer: Self-pay | Admitting: Occupational Therapy

## 2014-05-16 DIAGNOSIS — R279 Unspecified lack of coordination: Secondary | ICD-10-CM

## 2014-05-16 DIAGNOSIS — R29898 Other symptoms and signs involving the musculoskeletal system: Secondary | ICD-10-CM

## 2014-05-16 DIAGNOSIS — R6889 Other general symptoms and signs: Secondary | ICD-10-CM

## 2014-05-16 DIAGNOSIS — Z5189 Encounter for other specified aftercare: Secondary | ICD-10-CM | POA: Diagnosis not present

## 2014-05-16 NOTE — Therapy (Signed)
[facility] [address] [place], [place], [address] Phone: [phone]   Fax:  [phone]  Occupational Therapy Treatment  Patient Details  Name: Kyle Cameron MRN: [id number] Date of Birth: 15-Feb-1926 Referring Provider:  Seward Carol, MD  Encounter Date: 05/16/2014      OT End of Session - 05/16/14 0946    Visit Number 15   Number of Visits 17   Date for OT Re-Evaluation 05/19/14   Authorization Type 1Medicare, 2AARP, G-code needed   Authorization - Visit Number 15   Authorization - Number of Visits 20   OT Start Time 0933   OT Stop Time 1015   OT Time Calculation (min) 42 min   Activity Tolerance Patient tolerated treatment well   Behavior During Therapy [facility] for tasks assessed/performed      Past Medical History  Diagnosis Date  . Hypertension   . High cholesterol   . Prostate atrophy   . GERD (gastroesophageal reflux disease)   . History of diverticulitis of colon   . Benign enlargement of prostate   . Hypothyroidism   . History of renal calculi   . Parkinson's disease   . Renal calculi   . CVA (cerebral infarction)     Past Surgical History  Procedure Laterality Date  . Cholecystectomy  02/04/2012    Procedure: LAPAROSCOPIC CHOLECYSTECTOMY WITH INTRAOPERATIVE CHOLANGIOGRAM;  Surgeon: Merrie Roof, MD;  Location: [facility];  Service: General;  Laterality: N/A;  . Lithotripsy      renal calculi  . Tonsillectomy    . Cataract extraction, bilateral      There were no vitals filed for this visit.  Visit Diagnosis:  Lack of coordination  Hypokinesia  Rigidity      Subjective Assessment - 05/16/14 0945    Subjective  Pt reports that he is using RUE when cooking, but just has difficulty moving around (balance/freezing)   Pertinent History Pt with PD (diagnosed 3 years ago) with 2 CVAs 12/15 followed by home health therapies.  Pt reports PD worse after CVA.  Pt reports increased difficulty  with speech, R hand, and walking.   Patient Stated Goals improve walking, R hand use, speech   Currently in Pain? No/denies                      OT Treatments/Exercises (OP) - 05/16/14 0001    ADLs   ADL Comments Began checking goals and discussing progress in prep for d/c.   Fine Motor Coordination   Grooved pegs placing pegs in with mod difficulty with R hand   Neurological Re-education Exercises   Other Exercises 1 Functional step and reach forward/backwards with CGA for safety/balance and min-mod cues for big movments to place large pegs in vertical pegboard overhead with mod difficulty with coordination and full wt. shift   Reciprocal Movements Arm bike x6 min (forward/backward) level 5 with min cues to maintain >40rpms, pt maintained 39-42rpms without rest           PWR [facility]) - 05/16/14 0946    PWR! exercises Hands   PWR! Up 20   PWR! Twist 10   Comments min cueing for big movements in prep for functional activity               OT Short Term Goals - 05/03/14 0947    OT SHORT TERM GOAL #1   Title Pt will be independent with updated HEP.--due 04/18/14   Time 4  Period Weeks   Status On-going  04/19/14:  continues to need cues for timing and needs updates   OT SHORT TERM GOAL #2   Title Pt will improve coordination as shown by completing 9-hole peg test in or less with dominant R hand.--due 04/18/14   Baseline 140.93sec   Time 4   Period Weeks   Status Achieved  04/19/14:  95.34 sec with multiple drops   OT SHORT TERM GOAL #3   Title Pt will be able to write at least 3 sentences with at least 50% legibility.--due 04/18/14   Baseline 25%   Time 4   Period Weeks   Status On-going  25-50%   OT SHORT TERM GOAL #4   Title Pt will improve ease with eating as shown by completing PPT#2 in less than 32sec--due 04/18/14   Baseline 40.41   Time 4   Period Weeks   Status Achieved  04/19/14:  14.84sec   OT SHORT TERM GOAL #5   Title Pt will improve  dressing ability as shown by improving time on PPT#4 by at least 5sec.--due 04/18/14   Baseline 04/21/14-26.52 secs, not met   Time 4   Period Weeks   Status On-going           OT Long Term Goals - 05/16/14 0940    OT LONG TERM GOAL #1   Title Pt will verbalize understanidng of adaptive strategies for ADLs/IADLs prn to increase ease/safety.--due 05/19/14   Time 8   Period Weeks   Status New   OT LONG TERM GOAL #2   Title Pt will improve coordination as shown by completing 9-hole peg test in 45 sec or less with dominant R hand.--due 05/19/14   Time 8   Period Weeks   Status Not Met  Met 04/27/14:  55.93, 05/16/14:  45.69sec.   OT LONG TERM GOAL #3   Title Pt will be able to write at least 3 sentences with at least 75% legibility.--due 05/19/14   Baseline 25%   Time 8   Period Weeks   Status New   OT LONG TERM GOAL #4   Title Pt will improve ease with eating as shown by completing PPT#2 in less than 25sec--due 05/19/14   Baseline 40.41   Time 8   Period Weeks   Status Achieved  14.18sec on 04/19/14   OT LONG TERM GOAL #5   Title Pt will improve BUE functional reaching/coordination for ADLs as shown by improving score on box and blocks test by at least 5.--due 05/19/14   Baseline 39 bilaterally   Time 8   Period Weeks   Status Partially Met  05/16/14:  R-40blocks, L-48blocks (met with LUE)   OT LONG TERM GOAL #6   Title Pt will perform simple-mod complex meal prep mod I.--due 05/19/14   Time 8   Period Weeks   Status Achieved  per pt report 05/03/14               Plan - 05/16/14 0943    Clinical Impression Statement Pt shows good progress with fine motor coordination and RUE functional use.  Partially met LTG #5 and approximating LTG #2.   Plan check remaining goals and d/c, g-code needed, schedule follow-up appts for screens/eval   OT Home Exercise Plan HEP issued:  coordination, PWR! modified quadraped issued 04/21/14   Consulted and Agree with Plan of Care Patient         Problem List Patient Active Problem List  Diagnosis Date Noted  . Discoloration of skin of foot 03/15/2014  . PD (Parkinson's disease) 03/15/2014  . 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 Essential hypertension 03/15/2014  . HLD (hyperlipidemia) 03/15/2014  . Cough   . LV (left ventricular) mural thrombus   . CVA (cerebral infarction) 01/18/2014  . Imbalance 01/18/2014  . Slurring of speech   . Abnormality of gait 03/12/2012  . Paralysis agitans 03/12/2012  . Acute cholecystitis 02/04/2012  . Hypertension 02/04/2012  . Hyperlipidemia 02/04/2012    [facility] 05/16/2014, 11:35 AM  [facility] [address] [place], [place], [address] Phone: [phone]   Fax:  [place], OTR/L 05/16/2014 11:35 AM

## 2014-05-18 ENCOUNTER — Ambulatory Visit: Payer: Medicare Other | Admitting: Occupational Therapy

## 2014-05-18 DIAGNOSIS — Z5189 Encounter for other specified aftercare: Secondary | ICD-10-CM | POA: Diagnosis not present

## 2014-05-18 DIAGNOSIS — R279 Unspecified lack of coordination: Secondary | ICD-10-CM

## 2014-05-18 DIAGNOSIS — R6889 Other general symptoms and signs: Secondary | ICD-10-CM

## 2014-05-18 DIAGNOSIS — M6281 Muscle weakness (generalized): Secondary | ICD-10-CM

## 2014-05-18 DIAGNOSIS — R29898 Other symptoms and signs involving the musculoskeletal system: Secondary | ICD-10-CM

## 2014-05-18 NOTE — Therapy (Addendum)
[facility] [address] [place], [place], [address] Phone: [phone]   Fax:  [phone]  Occupational Therapy Treatment  Patient Details  Name: Kyle Cameron MRN: [id number] Date of Birth: [birth year] Referring Provider:  Dr. Erlinda Hong Encounter Date: 05/18/2014      OT End of Session - 05/18/14 0852    Visit Number 16   Number of Visits 17   Date for OT Re-Evaluation 05/19/14   Authorization Type 1Medicare, 2AARP, G-code needed   Authorization - Visit Number 16   Authorization - Number of Visits 20   OT Start Time 0847   OT Stop Time 0930   OT Time Calculation (min) 43 min   Activity Tolerance Patient tolerated treatment well   Behavior During Therapy [facility] for tasks assessed/performed      Past Medical History  Diagnosis Date  . Hypertension   . High cholesterol   . Prostate atrophy   . GERD (gastroesophageal reflux disease)   . History of diverticulitis of colon   . Benign enlargement of prostate   . Hypothyroidism   . History of renal calculi   . Parkinson's disease   . Renal calculi   . CVA (cerebral infarction)     Past Surgical History  Procedure Laterality Date  . Cholecystectomy  02/04/2012    Procedure: LAPAROSCOPIC CHOLECYSTECTOMY WITH INTRAOPERATIVE CHOLANGIOGRAM;  Surgeon: Merrie Roof, MD;  Location: [facility];  Service: General;  Laterality: N/A;  . Lithotripsy      renal calculi  . Tonsillectomy    . Cataract extraction, bilateral      There were no vitals filed for this visit.  Visit Diagnosis:  Lack of coordination  Hypokinesia  Rigidity  Generalized muscle weakness      Subjective Assessment - 05/18/14 0848    Pertinent History Pt with PD (diagnosed 3 years ago) with 2 CVAs 12/15 followed by home health therapies.  Pt reports PD worse after CVA.  Pt reports increased difficulty with speech, R hand, and walking.   Patient Stated Goals improve walking, R hand use, speech    Currently in Pain? No/denies      Treatment: Therapist finished checking progress towards goals and discussed plans to schedule screens for 3-4 months from now. Arm bike x 6 mins level 1 for conditioning, min v.c. To maintain 40RPM. Dynamic functional step and reach with min v.c. For PWR! Hands.  Education regarding ways to prevent freezing  As pt was noted to have several freezing episodes and 1 LOB today.                   [facility]) - 05/18/14 0177    PWR! exercises Moves in [place]! Up 10   PWR! Rock 10   PWR! Twist 10   PWR! Step 10   Comments modified quadraped at table             OT Education - 05/18/14 0957    Education provided Yes   Education Details ways to prevent freezing   Person(s) Educated Patient   Methods Explanation;Handout   Comprehension Verbalized understanding          OT Short Term Goals - 05/03/14 0947    OT SHORT TERM GOAL #1   Title Pt will be independent with updated HEP.--due 04/18/14   Time 4   Period Weeks   Status On-going  04/19/14:  continues to need cues for timing and needs updates  OT SHORT TERM GOAL #2   Title Pt will improve coordination as shown by completing 9-hole peg test in 75mn or less with dominant R hand.--due 04/18/14   Baseline 140.93sec   Time 4   Period Weeks   Status Achieved  04/19/14:  95.34 sec with multiple drops   OT SHORT TERM GOAL #3   Title Pt will be able to write at least 3 sentences with at least 50% legibility.--due 04/18/14   Baseline 25%   Time 4   Period Weeks   Status On-going  25-50%   OT SHORT TERM GOAL #4   Title Pt will improve ease with eating as shown by completing PPT#2 in less than 32sec--due 04/18/14   Baseline 40.41   Time 4   Period Weeks   Status Achieved  04/19/14:  14.84sec   OT SHORT TERM GOAL #5   Title Pt will improve dressing ability as shown by improving time on PPT#4 by at least 5sec.--due 04/18/14   Baseline 04/21/14-26.52 secs, not met   Time 4    Period Weeks   Status On-going           OT Long Term Goals - 05/18/14 0849    OT LONG TERM GOAL #1   Title Pt will verbalize understanidng of adaptive strategies for ADLs/IADLs prn to increase ease/safety.--due 05/19/14   Baseline Pt verbalizes understanding of strategies   Time 8   Period Weeks   Status Achieved   OT LONG TERM GOAL #2   Title Pt will improve coordination as shown by completing 9-hole peg test in 45 sec or less with dominant R hand.--due 05/19/14   Time 8   Period Weeks   Status Not Met  Met 04/27/14:  55.93, 05/16/14:  45.69sec.   OT LONG TERM GOAL #3   Title Pt will be able to write at least 3 sentences with at least 75% legibility.--due 05/19/14   Baseline 75% legibilty and larger size   Time 8   Period Weeks   Status Achieved   OT LONG TERM GOAL #4   Title Pt will improve ease with eating as shown by completing PPT#2 in less than 25sec--due 05/19/14   Baseline 40.41   Time 8   Period Weeks   Status Achieved  14.18sec on 04/19/14   OT LONG TERM GOAL #5   Title Pt will improve BUE functional reaching/coordination for ADLs as shown by improving score on box and blocks test by at least 5.--due 05/19/14   Baseline 39 bilaterally   Time 8   Period Weeks   Status Partially Met  05/16/14:  R-40blocks, L-48blocks (met with LUE)   OT LONG TERM GOAL #6   Title Pt will perform simple-mod complex meal prep mod I.--due 05/19/14   Time 8   Period Weeks   Status Achieved  per pt report 05/03/14               Plan - 05/18/14 0903    Clinical Impression Statement Pt met handwriting goal with improved size and legibility today.   Pt will benefit from skilled therapeutic intervention in order to improve on the following deficits (Retired) Decreased balance;Decreased knowledge of use of DME;Impaired UE functional use;Impaired perceived functional ability;Decreased strength;Decreased mobility;Decreased activity tolerance;Impaired tone;Impaired sensation;Decreased safety  awareness;Decreased endurance;Decreased coordination   Rehab Potential Good   OT Frequency 2x / week   OT Duration 8 weeks   OT Treatment/Interventions Self-care/ADL training;Moist Heat;DME and/or AE instruction;Fluidtherapy;Splinting;Patient/family education;Balance training;Therapeutic exercises;Therapeutic exercise;Therapeutic activities;Functional  Mobility Training;Neuromuscular education;Cryotherapy;Energy conservation;Manual Therapy;Electrical Stimulation   Plan discharge OT   OT Home Exercise Plan HEP issued:  coordination, PWR! modified quadraped issued 04/21/14   Consulted and Agree with Plan of Care Patient          G-Codes - 05-25-2014 0944    Functional Assessment Tool Used PPT #2: 14.18  secs    9 hole peg test RUE 45.69, writes 3 sentences with 75% legibility   Functional Limitation Carrying, moving and handling objects   Carrying, Moving and Handling Objects Goal Status (M2111) At least 20 percent but less than 40 percent impaired, limited or restricted   Carrying, Moving and Handling Objects Discharge Status [phone]) At least 20 percent but less than 40 percent impaired, limited or restricted      Problem List Patient Active Problem List   Diagnosis Date Noted  . Discoloration of skin of foot 03/15/2014  . PD (Parkinson's disease) 03/15/2014  . 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 Essential hypertension 03/15/2014  . HLD (hyperlipidemia) 03/15/2014  . Cough   . LV (left ventricular) mural thrombus   . CVA (cerebral infarction) 01/18/2014  . Imbalance 01/18/2014  . Slurring of speech   . Abnormality of gait 03/12/2012  . Paralysis agitans 03/12/2012  . Acute cholecystitis 02/04/2012  . Hypertension 02/04/2012  . Hyperlipidemia 02/04/2012  OCCUPATIONAL THERAPY DISCHARGE SUMMARY  Current functional level related to goals / functional outcomes: Pt met 4/6 long term goals. He demonstrates overall progress.   Remaining  deficits: Decreased strength, decreased coordination, rigidity, hypokinesia, decreased balance   Education / Equipment: Pt was educated regarding the following: adapted strategies for ADLS, HEP and ways to prevent freezing. Pt verbalized understanding of all education. Pt can benefit from a screen in 3-4 months due to the progressive nature of Parkinson's disease.  Plan: Patient agrees to discharge.  Patient goals were not met. Patient is being discharged due to meeting the stated rehab goals.  ?????      RINE,KATHRYN 05-25-14, 9:58 AM Theone Murdoch, OTR/L [phone] Phone: [phone] 9:58 AM May 25, 2014 [facility] [address] [place], [place], [address] Phone: [phone]   Fax:  [phone]

## 2014-05-28 ENCOUNTER — Other Ambulatory Visit: Payer: Self-pay | Admitting: Neurology

## 2014-05-30 ENCOUNTER — Ambulatory Visit (INDEPENDENT_AMBULATORY_CARE_PROVIDER_SITE_OTHER): Payer: Medicare Other | Admitting: Neurology

## 2014-05-30 ENCOUNTER — Encounter: Payer: Self-pay | Admitting: Neurology

## 2014-05-30 VITALS — BP 118/70 | HR 84 | Ht 67.0 in | Wt 151.0 lb

## 2014-05-30 DIAGNOSIS — I6340 Cerebral infarction due to embolism of unspecified cerebral artery: Secondary | ICD-10-CM | POA: Diagnosis not present

## 2014-05-30 DIAGNOSIS — R269 Unspecified abnormalities of gait and mobility: Secondary | ICD-10-CM | POA: Diagnosis not present

## 2014-05-30 DIAGNOSIS — G20 Parkinson's disease: Secondary | ICD-10-CM

## 2014-05-30 MED ORDER — CARBIDOPA-LEVODOPA 25-100 MG PO TABS: 1.5000 | ORAL_TABLET | Freq: Three times a day (TID) | ORAL | Status: DC

## 2014-05-30 NOTE — Progress Notes (Signed)
Reason for visit: Parkinson's disease  Kyle Cameron is an 81 y.o. male  History of present illness:  Kyle Cameron is an 81 year old gentleman with a history of Parkinson's disease who sustained a recent cardioembolic stroke. The patient had a mural thrombus, he currently is on Coumadin. He has recently completed speech, physical, and occupational therapy. He is having increasing problems with freezing while walking over the last 2 months. The patient uses a cane outside the house, and a walker inside the house. He has difficulty initiating ambulation, and he has difficulty with turns. He has fallen on occasion, the last fall was 2 weeks ago when he was trying to put on a pair of pants while standing up. The patient has a low amplitude voice, with no reports of difficulty swallowing. He has some numbness on the right face and right hand. The patient returns for an evaluation.  Past Medical History  Diagnosis Date  . Hypertension   . High cholesterol   . Prostate atrophy   . GERD (gastroesophageal reflux disease)   . History of diverticulitis of colon   . Benign enlargement of prostate   . Hypothyroidism   . History of renal calculi   . Parkinson's disease   . Renal calculi   . CVA (cerebral infarction)     Past Surgical History  Procedure Laterality Date  . Cholecystectomy  02/04/2012    Procedure: LAPAROSCOPIC CHOLECYSTECTOMY WITH INTRAOPERATIVE CHOLANGIOGRAM;  Surgeon: Merrie Roof, MD;  Location: [facility];  Service: General;  Laterality: N/A;  . Lithotripsy      renal calculi  . Tonsillectomy    . Cataract extraction, bilateral      Family History  Problem Relation Age of Onset  . Parkinsonism Mother   . Heart disease Father   . Esophageal cancer Sister     Social history:  reports that he has quit smoking. He has never used smokeless tobacco. He reports that he does not drink alcohol or use illicit drugs.   No Known Allergies  Medications:  Prior to Admission  medications   Medication Sig Start Date End Date Taking? Authorizing Provider  acetaminophen (TYLENOL) 500 MG tablet Take 500 mg by mouth every 8 (eight) hours as needed for mild pain or moderate pain.   Yes Historical Provider, MD  carbidopa-levodopa (SINEMET IR) 25-100 MG per tablet Take 1 tablet by mouth 3 (three) times daily. 05/29/14  Yes Kathrynn Ducking, MD  cholecalciferol (VITAMIN D) 1000 UNITS tablet Take 1,000 Units by mouth daily.   Yes Historical Provider, MD  dutasteride (AVODART) 0.5 MG capsule Take 0.5 mg by mouth every other day.   Yes Historical Provider, MD  levothyroxine (SYNTHROID, LEVOTHROID) 50 MCG tablet Take 50 mcg by mouth daily before breakfast.   Yes Historical Provider, MD  losartan-hydrochlorothiazide (HYZAAR) 100-25 MG per tablet Take 1 tablet by mouth daily.   Yes Historical Provider, MD  selegiline (ELDEPRYL) 5 MG tablet TAKE ONE TABLET BY MOUTH TWICE DAILY with meals. take with breakfast and lunch 03/30/14  Yes Kathrynn Ducking, MD  warfarin (COUMADIN) 5 MG tablet Take 5 mg by mouth daily.    Yes Historical Provider, MD    ROS:  Out of a complete 14 system review of symptoms, the patient complains only of the following symptoms, and all other reviewed systems are negative.  Gait disturbance Numbness  Blood pressure 118/70, pulse 84, height 5\' 7"  (1.702 m), weight 151 lb (68.493 kg).  Physical Exam  General: The patient is alert and cooperative at the time of the examination.  Skin: No significant peripheral edema is noted.   Neurologic Exam  Mental status: The patient is alert and oriented x 3 at the time of the examination. The patient has apparent normal recent and remote memory, with an apparently normal attention span and concentration ability.   Cranial nerves: Facial symmetry is present. Speech is dysphonic, at times hesitant. Extraocular movements are full. Visual fields are full. Masking of the face is seen.  Motor: The patient has good  strength in all 4 extremities.  Sensory examination: Soft touch sensation is symmetric on the face, arms, and legs, with the exception of some decrease in soft touch on the right hand.  Coordination: The patient has good finger-nose-finger and heel-to-shin bilaterally.  Gait and station: The patient is able to arise from a seated position with arms crossed. Once up, he walks with a cane. He has some trouble with freezing with initiation of ambulation, and with turns. Romberg is negative. No drift is seen.  Reflexes: Deep tendon reflexes are symmetric.   Assessment/Plan:  1. Parkinson's disease  2. Gait disturbance  3. History of cardioembolic stroke  The patient has finished up with physical and occupational therapy. He is still having some issues with balance. He will be increased on the Sinemet taking 1.5 tablets of the 25/100 mg tablets 3 times daily. He will continue on the selegiline 5 mg twice daily. He will follow-up in about 4 months.  Jill Alexanders MD 05/30/2014 7:50 PM  [facility] [address] [place], [place] [address]  Phone [phone] Fax [phone]

## 2014-05-30 NOTE — Patient Instructions (Addendum)
With the Sinemet, begin taking 1-1/2 tablets in the morning, 1 tablet at midday, and 1 tablet in the evening for one week, then take 1-1/2 tablets in the morning and at midday, one tablet in the evening for one week, then begin taking 1-1/2 tablets 3 times daily  Fall Prevention and Home Safety Falls cause injuries and can affect all age groups. It is possible to use preventive measures to significantly decrease the likelihood of falls. There are many simple measures which can make your home safer and prevent falls. OUTDOORS  Repair cracks and edges of walkways and driveways.  Remove high doorway thresholds.  Trim shrubbery on the main path into your home.  Have good outside lighting.  Clear walkways of tools, rocks, debris, and clutter.  Check that handrails are not broken and are securely fastened. Both sides of steps should have handrails.  Have leaves, snow, and ice cleared regularly.  Use sand or salt on walkways during winter months.  In the garage, clean up grease or oil spills. BATHROOM  Install night lights.  Install grab bars by the toilet and in the tub and shower.  Use non-skid mats or decals in the tub or shower.  Place a plastic non-slip stool in the shower to sit on, if needed.  Keep floors dry and clean up all water on the floor immediately.  Remove soap buildup in the tub or shower on a regular basis.  Secure bath mats with non-slip, double-sided rug tape.  Remove throw rugs and tripping hazards from the floors. BEDROOMS  Install night lights.  Make sure a bedside light is easy to reach.  Do not use oversized bedding.  Keep a telephone by your bedside.  Have a firm chair with side arms to use for getting dressed.  Remove throw rugs and tripping hazards from the floor. KITCHEN  Keep handles on pots and pans turned toward the center of the stove. Use back burners when possible.  Clean up spills quickly and allow time for drying.  Avoid  walking on wet floors.  Avoid hot utensils and knives.  Position shelves so they are not too high or low.  Place commonly used objects within easy reach.  If necessary, use a sturdy step stool with a grab bar when reaching.  Keep electrical cables out of the way.  Do not use floor polish or wax that makes floors slippery. If you must use wax, use non-skid floor wax.  Remove throw rugs and tripping hazards from the floor. STAIRWAYS  Never leave objects on stairs.  Place handrails on both sides of stairways and use them. Fix any loose handrails. Make sure handrails on both sides of the stairways are as long as the stairs.  Check carpeting to make sure it is firmly attached along stairs. Make repairs to worn or loose carpet promptly.  Avoid placing throw rugs at the top or bottom of stairways, or properly secure the rug with carpet tape to prevent slippage. Get rid of throw rugs, if possible.  Have an electrician put in a light switch at the top and bottom of the stairs. OTHER FALL PREVENTION TIPS  Wear low-heel or rubber-soled shoes that are supportive and fit well. Wear closed toe shoes.  When using a stepladder, make sure it is fully opened and both spreaders are firmly locked. Do not climb a closed stepladder.  Add color or contrast paint or tape to grab bars and handrails in your home. Place contrasting color strips on  first and last steps.  Learn and use mobility aids as needed. Install an electrical emergency response system.  Turn on lights to avoid dark areas. Replace light bulbs that burn out immediately. Get light switches that glow.  Arrange furniture to create clear pathways. Keep furniture in the same place.  Firmly attach carpet with non-skid or double-sided tape.  Eliminate uneven floor surfaces.  Select a carpet pattern that does not visually hide the edge of steps.  Be aware of all pets. OTHER HOME SAFETY TIPS  Set the water temperature for 120 F  (48.8 C).  Keep emergency numbers on or near the telephone.  Keep smoke detectors on every level of the home and near sleeping areas. Document Released: 12/28/2001 Document Revised: 07/09/2011 Document Reviewed: 03/29/2011 [facility] Patient Information 2015 [place], [place]. This information is not intended to replace advice given to you by your health care provider. Make sure you discuss any questions you have with your health care provider.

## 2014-05-31 ENCOUNTER — Other Ambulatory Visit: Payer: Self-pay | Admitting: Neurology

## 2014-06-21 ENCOUNTER — Ambulatory Visit: Payer: Medicare Other | Admitting: Neurology

## 2014-06-22 ENCOUNTER — Encounter: Payer: Self-pay | Admitting: Neurology

## 2014-06-22 ENCOUNTER — Ambulatory Visit (INDEPENDENT_AMBULATORY_CARE_PROVIDER_SITE_OTHER): Payer: Medicare Other | Admitting: Neurology

## 2014-06-22 VITALS — BP 114/72 | HR 87 | Wt 151.0 lb

## 2014-06-22 DIAGNOSIS — I63412 Cerebral infarction due to embolism of left middle cerebral artery: Secondary | ICD-10-CM

## 2014-06-22 DIAGNOSIS — G20 Parkinson's disease: Secondary | ICD-10-CM

## 2014-06-22 DIAGNOSIS — R269 Unspecified abnormalities of gait and mobility: Secondary | ICD-10-CM

## 2014-06-22 DIAGNOSIS — I10 Essential (primary) hypertension: Secondary | ICD-10-CM

## 2014-06-22 DIAGNOSIS — I213 ST elevation (STEMI) myocardial infarction of unspecified site: Secondary | ICD-10-CM | POA: Diagnosis not present

## 2014-06-22 DIAGNOSIS — I6340 Cerebral infarction due to embolism of unspecified cerebral artery: Secondary | ICD-10-CM

## 2014-06-22 DIAGNOSIS — E785 Hyperlipidemia, unspecified: Secondary | ICD-10-CM

## 2014-06-22 DIAGNOSIS — I513 Intracardiac thrombosis, not elsewhere classified: Secondary | ICD-10-CM

## 2014-06-22 NOTE — Patient Instructions (Signed)
-   continue coumadin for stroke prevention for now. INR goal 2-3.  - recommend you to discuss with Dr. Delfina Redwood for referral to cardiology for evaluation of clot in your heart and the duration of anticoagulation.. - follow up with Dr. Jannifer Franklin for PD and may consider movement disorder referral if sinemet is not working for you. - check BP at home - continue sinemet and selegiline - Follow up with your primary care physician for stroke risk factor modification. Recommend maintain blood pressure goal <130/80, diabetes with hemoglobin A1c goal below 6.5% and lipids with LDL cholesterol goal below 70 mg/dL.  - follow up in 6 months.

## 2014-06-22 NOTE — Progress Notes (Signed)
STROKE NEUROLOGY FOLLOW UP NOTE  NAME: Kyle Cameron DOB: Aug 05, 1926  REASON FOR VISIT: stroke follow up HISTORY FROM: pt and chart  Today we had the pleasure of seeing Kyle Cameron in follow-up at our Neurology Clinic. Pt was accompanied by no one.   History Summary [name] is an 81 y.o. male with history of HTN, HLD, PD following up with Dr. Jannifer Franklin in our practice taking sinemet and selegiline. He was visiting in [place] around 12/26/2013 when he had acute onset right hemiparesis and expressive aphasia. He went to the emergency room, and was found to have a posterior left frontal stroke. He was found to have a LV apical thrombus by TTE, and was placed on Coumadin therapy. The patient returned to [facility] had repeat 2D echo ordered by PCP and no mural thrombus seen. However, he suffered a recurring left cerebellar stroke while on Coumadin on 01/18/2014 with dysarthria and ataxia. His INR was low at that time at 1.47, he was giving lovenox bridging and continued on coumadin.   Follow up 03/15/14 - the patient has been doing better. The patient is now back home with home health PT/OT/speech. He remains on Coumadin, last check INR was >3 about 2 weeks ago. He still reports some difficulty with word finding and shuffling more with his walking. He has not had any falls. He denies problems with swallowing. He continues to have hypophonia with his Parkinson's disease. He followed with Dr. Jannifer Franklin on 01/24/14 and increased sinemet dose for increasing problem with freezing during walking and with turns.  Today in clinic, he complained that his right foot becomes discolorated for about 2 months. His right leg was swollen before but now subsided, however the color in the right foot is always purple and different from the left. He denies any foot pain, pale, or numbness tingling. However the right toes are cold all the time.  Interval History During the interval time, the patient has been doing  the same. However, he complains that his voice become softer, lose balance a lot, has to use cane, more freezing episodes on walking. Continued with PT/OT. Saw Dr. Eugenie Birks for PD and increased sinemet dose to 1.5 Tab tid but no change at all. Afraid of crowd space and afraid of going out due to freezing on initiating of gait. He admits that if he missed sinemet dose, no change of symptoms. BP 114/72 today in clinic. Still on coumadin and INR last check 2.7 as per pt. Right foot discoloration resolved. Doppler right LE negative.   REVIEW OF SYSTEMS: Full 14 system review of systems performed and notable only for those listed below and in HPI above, all others are negative:  Constitutional:   Cardiovascular:  Ear/Nose/Throat:   Skin:  Eyes:   Respiratory:   Gastroitestinal:   Genitourinary:  Hematology/Lymphatic:   Endocrine:  Musculoskeletal:   Allergy/Immunology:   Neurological:  Walking difficulty Psychiatric:  Sleep:   The following represents the patient's updated allergies and side effects list: No Known Allergies  The neurologically relevant items on the patient's problem list were reviewed on today's visit.  Neurologic Examination  A problem focused neurological exam (12 or more points of the single system neurologic examination, vital signs counts as 1 point, cranial nerves count for 8 points) was performed.  Blood pressure 114/72, pulse 87, weight 151 lb (68.493 kg).  General - Well nourished, well developed, in no apparent distress.  Ophthalmologic - Sharp disc margins OU.  Cardiovascular - Regular rate and  rhythm with no murmur.   Mental Status -  Level of arousal and orientation to time, place, and person were intact. Mild word finding difficulties, however intact naming, repetition, and comprehension. Mild dysarthria.  Cranial Nerves II - XII - II - Visual field intact OU. III, IV, VI - Extraocular movements intact. V - Facial sensation intact bilaterally. VII -  Facial movement intact bilaterally.  VIII - Hearing & vestibular intact bilaterally. X - Palate elevates symmetrically, mild dysarthria, soft voice. XI - Chin turning & shoulder shrug intact bilaterally. XII - Tongue protrusion intact.  Motor Strength - The patient's strength was normal in all extremities and pronator drift was absent.  Bulk was normal and fasciculations were absent.   Motor Tone - Muscle tone was assessed at the neck and appendages and was normal. No rigidity.  Reflexes - The patient's reflexes were 1+ in all extremities and he had no pathological reflexes.  Sensory - Light touch, temperature/pinprick were assessed and were normal.    Coordination - The patient had normal movements in the hands with no ataxia or dysmetria. No resting tremor.  Gait and Station - able to rise from a seated position with arms crossed, with some tendency to lean backwards. He can walk without assistive device, has good stride, but he has difficulty with initiation of gait. Tandem gait was not attempted.  Data reviewed: I personally reviewed the images and agree with the radiology interpretations.  Dg Chest 2 View 01/18/2014  No active cardiopulmonary disease.   Ct Head Wo Contrast 01/18/2014  Acute to subacute left posterior frontal/anterior parietal infarct.   Mr Brain Wo Contrast 01/18/2014  1. Acute/subacute infarct in the posterior left frontal lobe is confirmed. There is evidence of cortical laminar necrosis without definite hemorrhage. 2. Acute/subacute infarct within the posterolateral left cerebellum. 3. More acute linear area of infarction within the anteromedial superior left cerebellum. 4. Other scattered white matter disease is present bilaterally and within the brainstem. 5. Mild sinus disease. 6. The report of a brain MRI 12/27/2013 in [place] does not document any infarcts in the cerebellum, suggesting these are new.   2D echo 01/06/14 - Left  ventricle: The cavity size was normal. Systolic function was normal. The estimated ejection fraction was in the range of 55% to 60%. Wall motion was normal; there were no regional wall motion abnormalities. - Mitral valve: There was trivial regurgitation. - Tricuspid valve: There was trivial regurgitation. - Pulmonary arteries: PA peak pressure: 37 mm Hg (S).  Impressions: - The right ventricular systolic pressure was increased consistent with mild pulmonary hypertension.   Component     Latest Ref Rng 01/18/2014 01/19/2014  Cholesterol     0 - 200 mg/dL  157  Triglycerides     <150 mg/dL  41  HDL     >39 mg/dL  57  Total CHOL/HDL Ratio       2.8  VLDL     0 - 40 mg/dL  8  LDL (calc)     0 - 99 mg/dL  92  Hemoglobin A1C     <5.7 % 6.0 (H)   Mean Plasma Glucose     <117 mg/dL 126 (H)   TSH     0.350 - 4.500 uIU/mL 1.530     Assessment: As you may recall, he is a 81 y.o. Caucasian male with PMH of HTN, HLD and PD was admitted in [place] on 12/29/13 for left frontal stroke and found to have left LV  thrombus put on coumadin. However, developed acute left cerebellar stroke on 01/18/14 due to low INR level. His repeat 2D echo on 01/06/14 did not see LV thrombus. He was bridged with lovenox and currently on coumadin with last check INR 2.7. He still has mild word finding difficultes and more freezing on gait initiation. Has been followed with Dr. Jannifer Franklin and increased sinemet dose to 1.5 tab tid but reported no significant effect. Recommend cardiology consult to determine the duration of anticoagulation use.    Plan:  - continue coumadin for stroke prevention for now. INR goal 2-3.  - recommend cardiology evaluation for the duration of anticoagulation. - follow up with Dr. Jannifer Franklin for abnormal gait and may consider movement disorder referral if sinemet no effect. - check BP at home - continue sinemet and selegiline - Follow up with your primary care physician for stroke risk  factor modification. Recommend maintain blood pressure goal <130/80, diabetes with hemoglobin A1c goal below 6.5% and lipids with LDL cholesterol goal below 70 mg/dL.  - RTC in 6 months.  I spent more than 25 minutes of face to face time with the patient. Greater than 50% of time was spent in counseling and coordination of care. We have discussed about future treatment plans.  No orders of the defined types were placed in this encounter.    No orders of the defined types were placed in this encounter.    Patient Instructions  - continue coumadin for stroke prevention for now. INR goal 2-3.  - recommend you to discuss with Dr. Delfina Redwood for referral to cardiology for evaluation of clot in your heart and the duration of anticoagulation.. - follow up with Dr. Jannifer Franklin for PD and may consider movement disorder referral if sinemet is not working for you. - check BP at home - continue sinemet and selegiline - Follow up with your primary care physician for stroke risk factor modification. Recommend maintain blood pressure goal <130/80, diabetes with hemoglobin A1c goal below 6.5% and lipids with LDL cholesterol goal below 70 mg/dL.  - follow up in 6 months.    Rosalin Hawking, MD PhD [facility] [address], [place], [place] [address] [phone]

## 2014-06-23 ENCOUNTER — Ambulatory Visit: Payer: Medicare Other | Admitting: Neurology

## 2014-06-30 ENCOUNTER — Ambulatory Visit: Payer: Medicare Other | Admitting: Speech Pathology

## 2014-06-30 ENCOUNTER — Ambulatory Visit: Payer: Medicare Other | Admitting: Physical Therapy

## 2014-06-30 ENCOUNTER — Ambulatory Visit: Payer: Medicare Other | Admitting: Occupational Therapy

## 2014-07-08 DIAGNOSIS — Z7901 Long term (current) use of anticoagulants: Secondary | ICD-10-CM | POA: Insufficient documentation

## 2014-07-18 ENCOUNTER — Ambulatory Visit: Payer: Medicare Other | Admitting: Cardiovascular Disease

## 2014-07-29 ENCOUNTER — Ambulatory Visit (INDEPENDENT_AMBULATORY_CARE_PROVIDER_SITE_OTHER): Payer: Medicare Other | Admitting: Cardiovascular Disease

## 2014-07-29 ENCOUNTER — Encounter: Payer: Self-pay | Admitting: Cardiovascular Disease

## 2014-07-29 VITALS — BP 112/64 | HR 86 | Ht 65.5 in | Wt 149.0 lb

## 2014-07-29 DIAGNOSIS — I6340 Cerebral infarction due to embolism of unspecified cerebral artery: Secondary | ICD-10-CM | POA: Diagnosis not present

## 2014-07-29 DIAGNOSIS — E785 Hyperlipidemia, unspecified: Secondary | ICD-10-CM

## 2014-07-29 DIAGNOSIS — I10 Essential (primary) hypertension: Secondary | ICD-10-CM

## 2014-07-29 NOTE — Assessment & Plan Note (Signed)
History of several CVAs back in December at which time he is placed on Coumadin anticoagulation. MRI showed strokes and several vascular territories. 2-D echo was entirely normal without embolic source although this was transthoracic. Patient also has a history of Parkinson's disease. He has some fine motor deficit in his right hand. He was inquiring about whether he could stop Coumadin. There is no documented history of PAF however one presumes that this was the case given the fact that he has strokes and several vascular territories. I told him it was high risk to discontinue anticoagulation in the setting.

## 2014-07-29 NOTE — Patient Instructions (Signed)
Dr Berry recommends that you follow-up with him as needed. 

## 2014-07-29 NOTE — Progress Notes (Signed)
07/29/2014 Evlyn Courier   Feb 26, 1926  [id number]  Primary Physician Kandice Hams, MD Primary Cardiologist: Lorretta Harp MD Renae Gloss   HPI:  Mr. Frett is an 81 year old gentleman cupping by a friend referred for evaluation of stroke thought to be cardiovascular embolic in nature. He does have a history of hypertension hyperlipidemia. He also has Parkinson's disease on Sinemet. He had several strokes back in December. MRI showed strokes in multiple vascular territories. 2-D echo showed normal LV function without embolic source. He was begun on Coumadin anticoagulation. There was never any documented PAF. His question is whether he can stop Coumadin. I suggested his stroke is probably cardioembolic and despite the fact that PAF was never documented I would be hard pressed to recommend discontinuing oral anticoagulation in this setting.   Current Outpatient Prescriptions  Medication Sig Dispense Refill  . acetaminophen (TYLENOL) 500 MG tablet Take 500 mg by mouth every 8 (eight) hours as needed for mild pain or moderate pain.    Marland Kitchen atorvastatin (LIPITOR) 20 MG tablet Take 20 mg by mouth daily.  6  . carbidopa-levodopa (SINEMET IR) 25-100 MG per tablet Take 1.5 tablets by mouth 3 (three) times daily. 180 tablet 6  . cholecalciferol (VITAMIN D) 1000 UNITS tablet Take 1,000 Units by mouth daily.    Marland Kitchen dutasteride (AVODART) 0.5 MG capsule Take 0.5 mg by mouth every other day.    . levothyroxine (SYNTHROID, LEVOTHROID) 50 MCG tablet Take 50 mcg by mouth daily before breakfast.    . losartan-hydrochlorothiazide (HYZAAR) 100-25 MG per tablet Take 1 tablet by mouth daily.    . selegiline (ELDEPRYL) 5 MG tablet TAKE ONE TABLET BY MOUTH TWICE DAILY with meals. take with breakfast and lunch 60 tablet 5  . warfarin (COUMADIN) 5 MG tablet Take 5 mg by mouth daily.      No current facility-administered medications for this visit.    Allergies  Allergen Reactions  . Ace  Inhibitors Other (See Comments)    History   Social History  . Marital Status: Single    Spouse Name: N/A  . Number of Children: 0  . Years of Education: college   Occupational History  . Retired    Social History Main Topics  . Smoking status: Former Research scientist (life sciences)  . Smokeless tobacco: Never Used  . Alcohol Use: No     Comment: Consumes 2 glasses of wine daily  . Drug Use: No  . Sexual Activity: Not on file   Other Topics Concern  . Not on file   Social History Narrative   Patient is right handed.   Patient drinks about 1 cup caffeine daily.     Review of Systems: General: negative for chills, fever, night sweats or weight changes.  Cardiovascular: negative for chest pain, dyspnea on exertion, edema, orthopnea, palpitations, paroxysmal nocturnal dyspnea or shortness of breath Dermatological: negative for rash Respiratory: negative for cough or wheezing Urologic: negative for hematuria Abdominal: negative for nausea, vomiting, diarrhea, bright red blood per rectum, melena, or hematemesis Neurologic: negative for visual changes, syncope, or dizziness All other systems reviewed and are otherwise negative except as noted above.    Blood pressure 112/64, pulse 86, height 5' 5.5" (1.664 m), weight 149 lb (67.586 kg).  General appearance: alert and no distress Neck: no adenopathy, no carotid bruit, no JVD, supple, symmetrical, trachea midline and thyroid not enlarged, symmetric, no tenderness/mass/nodules Lungs: clear to auscultation bilaterally Heart: regular rate and rhythm, S1, S2 normal, no murmur, click,  rub or gallop Extremities: extremities normal, atraumatic, no cyanosis or edema  EKG Nirmal/86 with low limb voltage and poor R-wave progression. I personally reviewed this EKG  ASSESSMENT AND PLAN:   Hypertension History of hypertension blood pressure measured at 112/64. He is on losartan and hydrochlorothiazide. Continue current meds at current  dosing  Hyperlipidemia His treatment of hyperlipidemia on atorvastatin followed by his PCP. His most recent lab work revealed a total cholesterol of 157, LDL of 92 HDL 57 performed 01/19/14.  CVA (cerebral infarction) History of several CVAs back in December at which time he is placed on Coumadin anticoagulation. MRI showed strokes and several vascular territories. 2-D echo was entirely normal without embolic source although this was transthoracic. Patient also has a history of Parkinson's disease. He has some fine motor deficit in his right hand. He was inquiring about whether he could stop Coumadin. There is no documented history of PAF however one presumes that this was the case given the fact that he has strokes and several vascular territories. I told him it was high risk to discontinue anticoagulation in the setting.      Lorretta Harp MD FACP,FACC,FAHA, [facility] 07/29/2014 3:29 PM

## 2014-07-29 NOTE — Assessment & Plan Note (Signed)
History of hypertension blood pressure measured at 112/64. He is on losartan and hydrochlorothiazide. Continue current meds at current dosing

## 2014-07-29 NOTE — Assessment & Plan Note (Signed)
His treatment of hyperlipidemia on atorvastatin followed by his PCP. His most recent lab work revealed a total cholesterol of 157, LDL of 92 HDL 57 performed 01/19/14.

## 2014-08-16 ENCOUNTER — Encounter: Payer: Self-pay | Admitting: Neurology

## 2014-08-16 ENCOUNTER — Ambulatory Visit (INDEPENDENT_AMBULATORY_CARE_PROVIDER_SITE_OTHER): Payer: Medicare Other | Admitting: Neurology

## 2014-08-16 VITALS — BP 118/66 | HR 84 | Ht 65.0 in | Wt 148.4 lb

## 2014-08-16 DIAGNOSIS — G20 Parkinson's disease: Secondary | ICD-10-CM | POA: Diagnosis not present

## 2014-08-16 DIAGNOSIS — G453 Amaurosis fugax: Secondary | ICD-10-CM | POA: Diagnosis not present

## 2014-08-16 DIAGNOSIS — I6340 Cerebral infarction due to embolism of unspecified cerebral artery: Secondary | ICD-10-CM

## 2014-08-16 HISTORY — DX: Amaurosis fugax: G45.3

## 2014-08-16 MED ORDER — PRAMIPEXOLE DIHYDROCHLORIDE 0.125 MG PO TABS: ORAL_TABLET | ORAL | Status: DC

## 2014-08-16 NOTE — Progress Notes (Signed)
Reason for visit: Parkinson's disease  Kyle Cameron is an 81 y.o. male  History of present illness:  Kyle Cameron is an 81 year old right-handed white male with a history of Parkinson's disease. The patient has dysphonic speech, and he has had increasing problems with freezing when initiating ambulation. The patient has fallen on at least 2 occasions since last seen. He has been increased on the Sinemet dose, taking 1.5 of the 25/100 mg tablets 3 times daily. The increase in the Sinemet has offered no benefit with his walking. Two or 3 weeks ago, he had an episode of transient visual loss involving the right eye. The patient has no reports of a carotid Doppler study done in the recent admission for stroke, but the son indicates that this may have been done in [place] before he came back to [place]. The patient denies any problems with swallowing or choking. He did not have any numbness or weakness with the transient visual loss that lasted 15-30 seconds. He remains on anticoagulation therapy.  Past Medical History  Diagnosis Date  . Hypertension   . High cholesterol   . Prostate atrophy   . GERD (gastroesophageal reflux disease)   . History of diverticulitis of colon   . Benign enlargement of prostate   . Hypothyroidism   . History of renal calculi   . Parkinson's disease   . Renal calculi   . CVA (cerebral infarction)   . Stroke   . Anticoagulated on Coumadin   . Headache   . Labyrinthitis   . Grover's disease   . Cataracts, bilateral   . Parkinson's disease   . Amaurosis fugax 08/16/2014    Right eye    Past Surgical History  Procedure Laterality Date  . Cholecystectomy  02/04/2012    Procedure: LAPAROSCOPIC CHOLECYSTECTOMY WITH INTRAOPERATIVE CHOLANGIOGRAM;  Surgeon: Merrie Roof, MD;  Location: [facility];  Service: General;  Laterality: N/A;  . Lithotripsy      renal calculi  . Tonsillectomy    . Cataract extraction, bilateral      Family History  Problem  Relation Age of Onset  . Parkinsonism Mother   . Heart disease Father   . Esophageal cancer Sister     Social history:  reports that he has quit smoking. He has never used smokeless tobacco. He reports that he drinks alcohol. He reports that he does not use illicit drugs.    Allergies  Allergen Reactions  . Ace Inhibitors Other (See Comments)    Medications:  Prior to Admission medications   Medication Sig Start Date End Date Taking? Authorizing Provider  acetaminophen (TYLENOL) 500 MG tablet Take 500 mg by mouth every 8 (eight) hours as needed for mild pain or moderate pain.   Yes Historical Provider, MD  atorvastatin (LIPITOR) 20 MG tablet Take 20 mg by mouth daily. 07/20/14  Yes Historical Provider, MD  carbidopa-levodopa (SINEMET IR) 25-100 MG per tablet Take 1.5 tablets by mouth 3 (three) times daily. 06/01/14  Yes Kathrynn Ducking, MD  cholecalciferol (VITAMIN D) 1000 UNITS tablet Take 1,000 Units by mouth daily.   Yes Historical Provider, MD  dutasteride (AVODART) 0.5 MG capsule Take 0.5 mg by mouth every other day.   Yes Historical Provider, MD  levothyroxine (SYNTHROID, LEVOTHROID) 50 MCG tablet Take 50 mcg by mouth daily before breakfast.   Yes Historical Provider, MD  losartan-hydrochlorothiazide (HYZAAR) 100-25 MG per tablet Take 1 tablet by mouth daily.   Yes Historical Provider, MD  selegiline (  ELDEPRYL) 5 MG tablet TAKE ONE TABLET BY MOUTH TWICE DAILY with meals. take with breakfast and lunch 03/30/14  Yes Kathrynn Ducking, MD  warfarin (COUMADIN) 5 MG tablet Take 5 mg by mouth daily.    Yes Historical Provider, MD  pramipexole (MIRAPEX) 0.125 MG tablet One tablet three times a day for 2 weeks, then take 2 tablets three times a day 08/16/14   Kathrynn Ducking, MD    ROS:  Out of a complete 14 system review of symptoms, the patient complains only of the following symptoms, and all other reviewed systems are negative.  Transit visual loss Gait disturbance  Blood pressure  118/66, pulse 84, height 5\' 5"  (1.651 m), weight 148 lb 6.4 oz (67.314 kg).  Physical Exam  General: The patient is alert and cooperative at the time of the examination.  Skin: No significant peripheral edema is noted.   Neurologic Exam  Mental status: The patient is alert and oriented x 3 at the time of the examination. The patient has apparent normal recent and remote memory, with an apparently normal attention span and concentration ability.   Cranial nerves: Facial symmetry is present. Speech is hypophonic. Extraocular movements are full. Visual fields are full.  Motor: The patient has good strength in all 4 extremities.  Sensory examination: Soft touch sensation is symmetric on the face, arms, and legs.  Coordination: The patient has good finger-nose-finger and heel-to-shin bilaterally.  Gait and station: The patient has the ability to arise from a seated position with arms crossed. Once up, the patient has difficulty initiating ambulation, once he begins walking, and he has good stride, arm swing is symmetric, slightly decreased. With turns, the patient freezes once again. Romberg is negative. No drift is seen.  Reflexes: Deep tendon reflexes are symmetric.   Assessment/Plan:  1. Parkinson's disease  2. Gait disorder  3. Cerebrovascular disease, cardioembolic strokes  4. Recent right amaurosis fugax  The patient will be set up for a carotid Doppler study. He will have blood work done to check a sedimentation rate and C-reactive protein. The patient will be placed on Mirapex taking 0.125 mg 3 times daily for 2 weeks, then go to 0.25 mg 3 times daily. He will follow-up to this office in 3-4 months.  Jill Alexanders MD 08/16/2014 7:26 PM  [facility] [address] [place], [place] [address]  Phone [phone] Fax [phone]

## 2014-08-16 NOTE — Patient Instructions (Addendum)
We will have an evaluation for the transient visual loss. Described, we will get blood work today, and a carotid Doppler study looking at the circulation to the brain. In terms of the Parkinson's disease, we will add a medication called Mirapex to help prevent some of the freezing episodes you are having. Please contact our office if you believe that you're having side effects on the medication. We will follow-up in about 3-4 months.   Amaurosis Fugax Amaurosis fugax is a condition in which a person loses sight in one eye. The loss of vision in the affected eye may be total or partial. It usually lasts just a few seconds or minutes. Then, it returns to normal. Occasionally, it may last for several hours. This is caused by interruption of blood flow to the artery that supplies blood to the retina (lining at the back of the eye, contains nerves needed for sight). The temporary loss of blood flow causes symptoms similar to a stroke. The family of symptoms that happen from a loss of blood flow is called a Transient Ischemic Attack (TIA, mini-stroke). In the case of amaurosis fugax, the eye is the organ that is involved. SYMPTOMS   Painless, sudden loss of vision in one eye.  Visual loss is often from the top down, appearing like a curtain being pulled down over the field of vision.  Rapid return of vision. Vision generally comes back in a few minutes to several hours. CAUSES  TIAs and amaurosis fugax are caused by a loss of blood flow. This can be due to a buildup of cholesterol and fats (plaque) in the arteries or the heart. If some of that plaque comes off the artery and gets into the bloodstream, it can flow to the artery that supplies blood to the retina, blocking the flow of blood to the retina. When that happens, vision is lost for as long as the blood flow is interrupted. Factors that make it more likely you will have amaurosis fugax at some point include:  Smoking.  Poorly controlled  diabetes.  High blood pressure.  High cholesterol levels. Medical conditions that may increase the risk of an attack of amaurosis fugax include:  Heart disease.  Diseases of the heart valves.  Certain diseases of the blood (sickle cell anemia, leukemia).  Blood clotting (coagulation) disorders.  Artery inflammation (temporal arteritis, giant cell arteritis). Since amaurosis fugax is an "incomplete stroke," in some people it can be a sign of an increased risk for an actual stroke. A stroke can result in permanent vision loss or loss of other body functions. As a result, caring for yourself after amaurosis fugax means taking many of the same steps you should take to prevent a stroke. HOME CARE INSTRUCTIONS   Only take over-the-counter or prescription medicines for pain, discomfort, or fever as directed by your caregiver.  Take any medicines that are prescribed for control of your blood pressure and cholesterol levels.  Keep diabetes under control as well as possible.  Stop smoking.  Follow diet instructions, if your caregiver has given them to you.  Try to get at least 30 minutes of moderate physical activity every day. If you have not been active, talk to your caregiver about how to get started. SEEK IMMEDIATE MEDICAL CARE IF:   You lose vision in one or both eyes again, even if only for a short period of time.  You lose vision in one eye and it does not recover within a very brief time (  less than 5-10 minutes). The sooner you see an eye specialist (ophthalmologist), the better the chance of regaining some vision, in the case of a central retinal artery occlusion (CRAO, blockage of central retinal artery). However, most cases of CRAO result in some degree of permanent visual loss, even with aggressive treatment.  You have symptoms of a stroke:  Weakness in one side of your body.  Difficulty speaking or thinking clearly.  Lack of coordination. Document Released: 10/17/2007  Document Revised: 04/01/2011 Document Reviewed: 10/17/2007 [facility] Patient Information 2015 [place], [place]. This information is not intended to replace advice given to you by your health care provider. Make sure you discuss any questions you have with your health care provider.

## 2014-08-17 ENCOUNTER — Telehealth: Payer: Self-pay

## 2014-08-17 LAB — SEDIMENTATION RATE: SED RATE: 2 mm/h (ref 0–30)

## 2014-08-17 LAB — C-REACTIVE PROTEIN: CRP: 0.6 mg/L (ref 0.0–4.9)

## 2014-08-17 NOTE — Telephone Encounter (Signed)
-----   Message from Kathrynn Ducking, MD sent at 08/17/2014  8:24 AM EDT -----  The blood work results are unremarkable. Please call the patient.  ----- Message -----    From: [facility] Lab Results In Interface    Sent: 08/17/2014   5:42 AM      To: Kathrynn Ducking, MD

## 2014-08-17 NOTE — Telephone Encounter (Signed)
I called the patient and relayed results. 

## 2014-08-26 NOTE — Patient Outreach (Signed)
[facility]) Care Management  08/26/2014  Kyle Cameron 1926-02-01 [id number]   Referral from [place] [facility], assigned Mariann Laster, RN to outreach.  [name]. [place], [place] Management [place] Assistant Phone: [phone] Fax: [phone]

## 2014-09-01 ENCOUNTER — Ambulatory Visit (INDEPENDENT_AMBULATORY_CARE_PROVIDER_SITE_OTHER): Payer: Medicare Other

## 2014-09-01 DIAGNOSIS — G453 Amaurosis fugax: Secondary | ICD-10-CM

## 2014-09-01 DIAGNOSIS — G20 Parkinson's disease: Secondary | ICD-10-CM

## 2014-09-08 ENCOUNTER — Ambulatory Visit: Payer: Medicare Other | Admitting: Cardiology

## 2014-09-09 ENCOUNTER — Telehealth: Payer: Self-pay | Admitting: Neurology

## 2014-09-09 NOTE — Telephone Encounter (Signed)
I called the patient. The carotid doppler study looks normal.

## 2014-09-12 ENCOUNTER — Other Ambulatory Visit: Payer: Self-pay

## 2014-09-12 NOTE — Patient Outreach (Signed)
[facility]) Care Management  09/12/2014  Kyle Cameron [birth year] [id number]  Telephonic Care Management Note:  (Triage)  Referral Date:  08/26/14 Referral Source:  MD Referral Issue:  CHF, CVA, Parkinson's with 2 admissions in past 12 months. PCP:  Dr. Deliah Boston MD, [facility] - last appt 08/16/14 Lorretta Harp MD, [place] - last appt 07/29/14 H/O Outpatient Rehab:  PT, OT, ST 04/2014  Outreach call #1 to patient.  Patient not reached at home #.  RN CM rescheduled for next outreach call within one week.   Mariann Laster, RN, BSN, [facility], CCM  [employer] Management Coordinator [phone] Direct [phone] Cell [phone] Office [phone] Fax

## 2014-09-14 ENCOUNTER — Other Ambulatory Visit: Payer: Self-pay

## 2014-09-14 NOTE — Patient Outreach (Addendum)
[facility]) Care Management  09/14/2014  Kyle Cameron 05-May-1926 [id number]   Telephonic Care Management Note: (Triage)  Referral Date: 08/26/14 Referral Source: MD Referral Issue: CHF, CVA, Parkinson's with 2 admissions in past 12 months. PCP: Dr. Seward Carol -appt due 08/2014 Neurologist: Jill Alexanders MD, [facility] - last appt 08/16/14 Cardiologist:  Lorretta Harp MD, [place] - last appt 07/29/14 H/O Outpatient Rehab: PT, OT, ST 04/2014  H/O voice message received back from [place] [phone] on 09/12/14. Outreach call #2 to patient. Patient and caregiver/Keith reached.   THN Condition: Patient verified he does not have CHF but confirms HTN, Stroke x 2 12/2013, Parkinson's, and 1 admission over the past year.   Social:  Patient lives in his home. Mobility complicated by Parkinson's. Falls:  Yes over the past year but with no injuries.  Caregiver/Friend:  [place].  Insurance:  Medicare and [place] supplemental policy as secondary.  States NO Part D / Medication coverage.  States patient would NOT be eligible for Medicaid and pays for meds out of pocket.   Medications: 10 medications;  Patient is able to afford medications at this time. Transportation:  yes   Consent:  Patient and caregiver report no needs.  States keeping all MD appt's, taking medications as ordered and no care coordination needs at this time.  Patient consents to screening call today but no further [facility] services.  RN CM advised should needs arise; patient may contact PCP for new referral to [facility].   Plan: RN CM notified [place] Assistant case closed - refused services  RN CM notified Primary MD via case closure letter.   Mariann Laster, RN, BSN, [facility], CCM  [employer] Management Coordinator [phone] Direct [phone] Cell [phone] Office [phone] Fax

## 2014-09-23 NOTE — Patient Outreach (Signed)
[facility]) Care Management  09/23/2014  Vander Kueker 05-17-1926 [id number]   Notification from Mariann Laster, RN to close case due to patient refused [place] Management services.  Thanks, [name]. [place], [place] Assistant Phone: [phone] Fax: [phone]

## 2014-09-30 ENCOUNTER — Encounter: Payer: Self-pay | Admitting: Neurology

## 2014-09-30 ENCOUNTER — Telehealth: Payer: Self-pay | Admitting: *Deleted

## 2014-09-30 ENCOUNTER — Ambulatory Visit (INDEPENDENT_AMBULATORY_CARE_PROVIDER_SITE_OTHER): Payer: Medicare Other | Admitting: Neurology

## 2014-09-30 ENCOUNTER — Ambulatory Visit: Payer: Medicare Other | Admitting: Neurology

## 2014-09-30 VITALS — BP 118/76 | HR 82 | Ht 65.0 in | Wt 145.0 lb

## 2014-09-30 DIAGNOSIS — I6340 Cerebral infarction due to embolism of unspecified cerebral artery: Secondary | ICD-10-CM

## 2014-09-30 DIAGNOSIS — R269 Unspecified abnormalities of gait and mobility: Secondary | ICD-10-CM | POA: Diagnosis not present

## 2014-09-30 DIAGNOSIS — G20 Parkinson's disease: Secondary | ICD-10-CM

## 2014-09-30 MED ORDER — CARBIDOPA-LEVODOPA 25-100 MG PO TABS: 2.0000 | ORAL_TABLET | Freq: Three times a day (TID) | ORAL | Status: DC

## 2014-09-30 NOTE — Patient Instructions (Addendum)
   Reduce the Mirapex taking 1 tablet 3 times daily. We will go up on the Sinemet tablet 25/100 mg taking 2 tablets 3 times daily. Contact me in 3 or 4 weeks if you are not doing well, we will consider the apomorphine injections.  Parkinson Disease Parkinson disease is a disorder of the central nervous system, which includes the brain and spinal cord. A person with this disease slowly loses the ability to completely control body movements. Within the brain, there is a group of nerve cells (basal ganglia) that help control movement. The basal ganglia are damaged and do not work properly in a person with Parkinson disease. In addition, the basal ganglia produce and use a brain chemical called dopamine. The dopamine chemical sends messages to other parts of the body to control and coordinate body movements. Dopamine levels are low in a person with Parkinson disease. If the dopamine levels are low, then the body does not receive the correct messages it needs to move normally.  CAUSES  The exact reason why the basal ganglia get damaged is not known. Some medical researchers have thought that infection, genes, environment, and certain medicines may contribute to the cause.  SYMPTOMS   An early symptom of Parkinson disease is often an uncontrolled shaking (tremor) of the hands. The tremor will often disappear when the affected hand is consciously used.  As the disease progresses, walking, talking, getting out of a chair, and new movements become more difficult.  Muscles get stiff and movements become slower.  Balance and coordination become harder.  Depression, trouble swallowing, urinary problems, constipation, and sleep problems can occur.  Later in the disease, memory and thought processes may deteriorate. DIAGNOSIS  There are no specific tests to diagnose Parkinson disease. You may be referred to a neurologist for evaluation. Your caregiver will ask about your medical history, symptoms, and perform  a physical exam. Blood tests and imaging tests of your brain may be performed to rule out other diseases. The imaging tests may include an MRI or a CT scan. TREATMENT  The goal of treatment is to relieve symptoms. Medicines may be prescribed once the symptoms become troublesome. Medicine will not stop the progression of the disease, but medicine can make movement and balance better and help control tremors. Speech and occupational therapy may also be prescribed. Sometimes, surgical treatment of the brain can be done in young people. HOME CARE INSTRUCTIONS  Get regular exercise and rest periods during the day to help prevent exhaustion and depression.  If getting dressed becomes difficult, replace buttons and zippers with Velcro and elastic on your clothing.  Take all medicine as directed by your caregiver.  Install grab bars or railings in your home to prevent falls.  Go to speech or occupational therapy as directed.  Keep all follow-up visits as directed by your caregiver. SEEK MEDICAL CARE IF:  Your symptoms are not controlled with your medicine.  You fall.  You have trouble swallowing or choke on your food. MAKE SURE YOU:  Understand these instructions.  Will watch your condition.  Will get help right away if you are not doing well or get worse. Document Released: 01/05/2000 Document Revised: 05/04/2012 Document Reviewed: 02/06/2011 [facility] Patient Information 2015 [place], [place]. This information is not intended to replace advice given to you by your health care provider. Make sure you discuss any questions you have with your health care provider.

## 2014-09-30 NOTE — Progress Notes (Signed)
Reason for visit: Parkinson's disease  Kyle Cameron is an 81 y.o. male  History of present illness:  Kyle Cameron is an 81 year old right-handed white male with a history of Parkinson's disease. The patient is on Sinemet taking the 25/100 mg tablets, 1.5 tablets 3 times daily. He is also taking Mirapex 0.25 mg 3 times daily and Eldepryl 5 mg twice daily. He has had some issues tolerating the Mirapex, he has had some confusion on the medication. The patient is having increasing problems with freezing to the point where he is essentially unable to ambulate. The freezing is not related to dosing of medication. He has not had any falls since last seen, he uses a cane for ambulation. He comes into the office today for an evaluation. He has not had any further events of visual loss in the right eye. A carotid Doppler study was unremarkable.  Past Medical History  Diagnosis Date  . Hypertension   . High cholesterol   . Prostate atrophy   . GERD (gastroesophageal reflux disease)   . History of diverticulitis of colon   . Benign enlargement of prostate   . Hypothyroidism   . History of renal calculi   . Parkinson's disease   . Renal calculi   . CVA (cerebral infarction)   . Stroke   . Anticoagulated on Coumadin   . Headache   . Labyrinthitis   . Grover's disease   . Cataracts, bilateral   . Parkinson's disease   . Amaurosis fugax 08/16/2014    Right eye    Past Surgical History  Procedure Laterality Date  . Cholecystectomy  02/04/2012    Procedure: LAPAROSCOPIC CHOLECYSTECTOMY WITH INTRAOPERATIVE CHOLANGIOGRAM;  Surgeon: Merrie Roof, MD;  Location: [facility];  Service: General;  Laterality: N/A;  . Lithotripsy      renal calculi  . Tonsillectomy    . Cataract extraction, bilateral      Family History  Problem Relation Age of Onset  . Parkinsonism Mother   . Heart disease Father   . Esophageal cancer Sister     Social history:  reports that he has quit smoking. He has  never used smokeless tobacco. He reports that he drinks alcohol. He reports that he does not use illicit drugs.    Allergies  Allergen Reactions  . Ace Inhibitors Other (See Comments)    Medications:  Prior to Admission medications   Medication Sig Start Date End Date Taking? Authorizing Provider  acetaminophen (TYLENOL) 500 MG tablet Take 500 mg by mouth every 8 (eight) hours as needed for mild pain or moderate pain.   Yes Historical Provider, MD  atorvastatin (LIPITOR) 20 MG tablet Take 20 mg by mouth daily. 07/20/14  Yes Historical Provider, MD  carbidopa-levodopa (SINEMET IR) 25-100 MG per tablet Take 2 tablets by mouth 3 (three) times daily. 09/30/14  Yes Kathrynn Ducking, MD  cholecalciferol (VITAMIN D) 1000 UNITS tablet Take 1,000 Units by mouth daily.   Yes Historical Provider, MD  dutasteride (AVODART) 0.5 MG capsule Take 0.5 mg by mouth every other day.   Yes Historical Provider, MD  levothyroxine (SYNTHROID, LEVOTHROID) 50 MCG tablet Take 50 mcg by mouth daily before breakfast.   Yes Historical Provider, MD  losartan-hydrochlorothiazide (HYZAAR) 100-25 MG per tablet Take 1 tablet by mouth daily.   Yes Historical Provider, MD  pramipexole (MIRAPEX) 0.125 MG tablet One tablet three times a day for 2 weeks, then take 2 tablets three times a day Patient  taking differently: Take 0.125 mg by mouth 3 (three) times daily.  08/16/14  Yes Kathrynn Ducking, MD  selegiline (ELDEPRYL) 5 MG tablet TAKE ONE TABLET BY MOUTH TWICE DAILY with meals. take with breakfast and lunch 03/30/14  Yes Kathrynn Ducking, MD  warfarin (COUMADIN) 5 MG tablet Take 5 mg by mouth daily. 5mg  Mon.-Fri, 7.5mg  Sat & Sun   Yes Historical Provider, MD    ROS:  Out of a complete 14 system review of symptoms, the patient complains only of the following symptoms, and all other reviewed systems are negative.  Walking problems, freezing  Blood pressure 118/76, pulse 82, height 5\' 5"  (1.651 m), weight 145 lb (65.772  kg).  Physical Exam  General: The patient is alert and cooperative at the time of the examination.  Skin: No significant peripheral edema is noted.   Neurologic Exam  Mental status: The patient is alert and oriented x 3 at the time of the examination. The patient has apparent normal recent and remote memory, with an apparently normal attention span and concentration ability.   Cranial nerves: Facial symmetry is present. Speech is hoarse, dysphonic. Extraocular movements are full. Visual fields are full.  Motor: The patient has good strength in all 4 extremities.  Sensory examination: Soft touch sensation is symmetric on the face, arms, and legs.  Coordination: The patient has good finger-nose-finger and heel-to-shin bilaterally.  Gait and station: The patient is able to stand from a seated position, but once up, he is effectively not able to walk given severe freezing issues.  Reflexes: Deep tendon reflexes are symmetric.   Assessment/Plan:  1. Parkinson's disease  2. Gait disturbance  The patient is having severe issues with freezing that are not dose related to medications. The patient is not tolerating Mirapex well, we will reduce the dose to 0.125 mg 3 times daily. The Sinemet will be increased taking 2 of the 25/100 mg tablets times daily, he will continue on the Eldepryl. The patient will follow-up in December 2016, if he is not improving over the next several weeks, he is to contact me. We may consider the use of apomorphine at that time.  Jill Alexanders MD 10/01/2014 8:37 AM  [facility] [address] [place], [place] [address]  Phone [phone] Fax [phone]

## 2014-10-01 ENCOUNTER — Encounter: Payer: Self-pay | Admitting: Neurology

## 2014-10-03 ENCOUNTER — Other Ambulatory Visit: Payer: Self-pay | Admitting: Neurology

## 2014-10-03 NOTE — Telephone Encounter (Signed)
Patient was seen in office by Dr. Jannifer Franklin on 09/30/14.

## 2014-10-14 ENCOUNTER — Telehealth: Payer: Self-pay | Admitting: Neurology

## 2014-10-14 MED ORDER — TRIMETHOBENZAMIDE HCL 300 MG PO CAPS: 300.0000 mg | ORAL_CAPSULE | Freq: Three times a day (TID) | ORAL | Status: DC

## 2014-10-14 MED ORDER — APOMORPHINE HCL 10 MG/ML ~~LOC~~ SOLN: 0.2000 mL | SUBCUTANEOUS | Status: DC | PRN

## 2014-10-14 NOTE — Telephone Encounter (Signed)
I have contacted the son. I will call in a prescription for the apomorphine and for Tigan. The patient will need to come in for the first dosing, premedicate with a Tigan. We'll started 0.2 mL injection in the office, then use this dose on a when necessary basis initially with Tigan pretreatment. The dose can be increased gradually over time, and eventually Tigan can be tapered off. The patient is to contact our office when he is able to obtain the medication, we'll set up a revisit for him.

## 2014-10-14 NOTE — Telephone Encounter (Signed)
Kyle Cameron [phone], called stating patient was seen recently by Dr. Jannifer Franklin. Feels that the recent recommendation by Dr. Jannifer Franklin is not working, Parkinson's is getting worse. Would like to start ASAP with injectable therapy.

## 2014-10-20 ENCOUNTER — Ambulatory Visit: Payer: Medicare Other | Admitting: Physical Therapy

## 2014-10-20 ENCOUNTER — Ambulatory Visit: Payer: Medicare Other | Attending: Internal Medicine | Admitting: Occupational Therapy

## 2014-10-20 ENCOUNTER — Other Ambulatory Visit: Payer: Self-pay | Admitting: Neurology

## 2014-10-20 ENCOUNTER — Ambulatory Visit: Payer: Medicare Other

## 2014-10-20 DIAGNOSIS — R269 Unspecified abnormalities of gait and mobility: Secondary | ICD-10-CM

## 2014-10-20 DIAGNOSIS — G20 Parkinson's disease: Secondary | ICD-10-CM

## 2014-10-20 DIAGNOSIS — R490 Dysphonia: Secondary | ICD-10-CM

## 2014-10-20 DIAGNOSIS — R258 Other abnormal involuntary movements: Secondary | ICD-10-CM | POA: Insufficient documentation

## 2014-10-20 DIAGNOSIS — R4701 Aphasia: Secondary | ICD-10-CM

## 2014-10-20 NOTE — Therapy (Signed)
[facility] [address] [place], [place], [address] Phone: [phone]   Fax:  [phone]  Patient Details  Name: Kyle Cameron MRN: [id number] Date of Birth: 07-25-1926 Referring Provider:  Seward Carol, MD  Encounter Date: 10/20/2014  Speech Therapy Parkinson's Disease Screen    Decibel Level today: 64dB  (WNL=70-72 dB) with sound level meter 30cm away from pt's mouth. Pt's verbal expression has improved since last treatment course, as his aphasia/apraxia has further resolved. Loudness in conversation was variable during the screen, from 59-67dB.  Pt would benefit from a speech-language eval for dysarthria. Pt said he would like to try 3-4 weeks of speech therapy to see if there is measurable change in his speech volume during that time.   [facility] , [place], [place]  10/20/2014, 10:23 AM  [facility] [address] [place], [place], [address] Phone: [phone]   Fax:  [phone]

## 2014-10-20 NOTE — Therapy (Signed)
[facility] [address] [place], [place], [address] Phone: [phone]   Fax:  [phone]  Patient Details  Name: Kyle Cameron MRN: [id number] Date of Birth: Jun 01, 1926 Referring Provider:  Seward Carol, MD  Encounter Date: 10/20/2014  Physical Therapy Parkinson's Disease Screen   Timed Up and Go test:Unable to complete, due to pt taking > 29minute for turn during TUG due to freezing episode.  10 meter walk test:14.55 sec (2.25 ft/sec)  5 time sit to stand test:13.16 sec with multiple posterior leans  Patient would benefit from Physical Therapy evaluation due to pt reported decline in function due to increase in severity and frequency of freezing episodes with gait.     MARRIOTT,AMY W. 10/20/2014, 10:47 AM  Frazier Butt., PT [facility] [address] [place], [place], [address] Phone: [phone]   Fax:  [phone]

## 2014-10-20 NOTE — Therapy (Signed)
[facility] [address] [place], [place], [address] Phone: [phone]   Fax:  [phone]  Patient Details  Name: Kyle Cameron MRN: [id number] Date of Birth: [birth year] Referring Provider:  Seward Carol, MD  Encounter Date: 10/20/2014  Occupational Therapy Parkinson's Disease Screen  Physical Performance Test item #2 (simulated eating):  14.59 but difficulty with bowl moving due to tremors  9-hole peg test:    RUE  75.18sec        LUE  36.38sec  Standing Functional Reach Test:   RUE  33 blocks        Change in ability to perform ADLs/IADLs:  Freezing has gotten worse.  Pt has had multiple falls but no significant injuries.  Pt reports incr time for ADLs and needs assist if in time constraints.  Pt reports incr difficulty for cooking tasks.  Pt would benefit from occupational therapy evaluation due to  Change in ADLs and decline in coordination.    [facility] 10/20/2014, 10:33 AM  [facility] [address] [place], [place], [address] Phone: [phone]   Fax:  [place], OTR/L 10/20/2014 10:58 AM

## 2014-10-25 NOTE — Telephone Encounter (Signed)
[name]/Caretaker called stating [facility] [phone] needs clarification on dosing for Apomorphine (injectable for Parkinson's)

## 2014-10-25 NOTE — Telephone Encounter (Signed)
I called back and spoke with Ebony Hail.  Verified Rx.  They will proceed with order as prescribed and will call us back if anything further is needed.

## 2014-10-26 ENCOUNTER — Telehealth: Payer: Self-pay | Admitting: Neurology

## 2014-10-26 NOTE — Telephone Encounter (Signed)
Pt needs APOMORPHINE HYDROCHLORIDE 10 MG/ML SOLN  Form to be sent to  [facility] speciality pharmacy. [name]-caregiver- says there is a form to be filled out on line and faxed to [phone]. Pt can not afford this medication and this will allow pt to get rx cheaper, right now medicatio is 5,300.00 with out insurance and 4,200.00 with insurance. Please call [name] at [phone]

## 2014-10-28 NOTE — Telephone Encounter (Signed)
[name]/Caretaker [phone] called to check status of APOMORPHINE HYDROCHLORIDE 10 MG/ML SOLN. Hasn't heard anything back since calling Wednesday.

## 2014-10-28 NOTE — Telephone Encounter (Signed)
I called back and spoke with [name].  The paperwork for completion was sent to the clinic yesterday.  Advised there is a potion that requires patient/caregiver auth.  He stated he was able to locate this form online, and they will turn that in unless the program allows him to do a verbal consent.  Explained we will follow up with the clinic and make sure everything is sent on our side.  He expressed understanding and appreciation.   (Unfortunately, I was not in the office on Wed when this message was taken)

## 2014-10-28 NOTE — Telephone Encounter (Signed)
Form has been completed and faxed to [facility] at [phone].

## 2014-11-01 NOTE — Telephone Encounter (Signed)
[name] , call and states that [facility] told him that the form was filled out wrong, the part where it say pt is taking and ongoing with the medication , it doesn't need to have that and it needs to be stated that it is a new Rx. Form may be found online. May call [name] ( caregiver) at [phone]

## 2014-11-01 NOTE — Telephone Encounter (Signed)
I contacted [facility], they asked that we refax the form already sent, which showed initial dose was marked.  This has been sent.  I called and spoke with [name].  He is aware, and also said they have provided the patient consent form to the program.

## 2014-11-02 NOTE — Telephone Encounter (Signed)
I called back and spoke with Christy Sartorius.  He verified they did receive the form from [place], but still need the patient authorization form.  Advised the patient's caretaker indicated he was sending that info to them.  He will note the file, and they will call us back if anything further is needed.

## 2014-11-02 NOTE — Telephone Encounter (Signed)
[facility] [phone] option 3, called regarding fax they received from our office. Ongoing order-box checked off, refills indicated. Per Alexia, cross through it and initial. Also, authorization not signed by patient. Alexia wants to know if we have POA on file. Please call.

## 2014-11-03 ENCOUNTER — Ambulatory Visit: Payer: Medicare Other | Admitting: Physical Therapy

## 2014-11-16 ENCOUNTER — Encounter: Payer: Self-pay | Admitting: Physical Therapy

## 2014-11-16 ENCOUNTER — Ambulatory Visit: Payer: Medicare Other

## 2014-11-16 ENCOUNTER — Ambulatory Visit: Payer: Medicare Other | Attending: Neurology | Admitting: Physical Therapy

## 2014-11-16 DIAGNOSIS — R29898 Other symptoms and signs involving the musculoskeletal system: Secondary | ICD-10-CM | POA: Insufficient documentation

## 2014-11-16 DIAGNOSIS — R269 Unspecified abnormalities of gait and mobility: Secondary | ICD-10-CM | POA: Diagnosis not present

## 2014-11-16 DIAGNOSIS — R258 Other abnormal involuntary movements: Secondary | ICD-10-CM | POA: Diagnosis present

## 2014-11-16 DIAGNOSIS — R490 Dysphonia: Secondary | ICD-10-CM

## 2014-11-16 DIAGNOSIS — R2689 Other abnormalities of gait and mobility: Secondary | ICD-10-CM | POA: Diagnosis present

## 2014-11-16 NOTE — Therapy (Signed)
[facility] [address] [place], [place], [address] Phone: [phone]   Fax:  [phone]  Patient Details  Name: Kyle Cameron MRN: [id number] Date of Birth: 1926-04-02 Referring Provider:  No ref. provider found  Encounter Date: 11/16/2014  PHYSICAL THERAPY DISCHARGE SUMMARY  Visits from Start of Care: 14 (03/16/14-05/04/14) Current Level of function:       PT Long Term Goals - 05/05/14 1056    PT LONG TERM GOAL #1   Title Pt will verbalize/demonstrate understanding of fall prevention techniques within home environment. (Target 05/15/14)   Status Achieved   PT LONG TERM GOAL #2   Title perform at least 8 of 10 reps of sit<>stand transfers from <18 inch surfaces, with no posterior lean, for improved efficiency and safety with transfers.   Status Achieved   PT LONG TERM GOAL #3   Title Pt will improve Functional Gait Assessment to at least 20/30 for decreased fall risk.   Status Not Met   PT LONG TERM GOAL #4   Title Pt will improve TUG score to less than or equal to 13.5 seconds for decreased fall risk.   Status Achieved   PT LONG TERM GOAL #5   Title Pt will verbalize plans for continued community fitness upon D/C from PT.   Status Achieved       Remaining deficits: Freezing of gait, balance   Education / Equipment: Fall prevention, HEP  Plan: Patient agrees to discharge.  Patient goals were partially met. Patient is being discharged due to being pleased with the current functional level.  ?????  Frazier Butt., PT   Kyle Thane W. 11/16/2014, 9:39 AM  [facility] [address] [place], [place], [address] Phone: [phone]   Fax:  [phone]

## 2014-11-16 NOTE — Therapy (Signed)
[facility] [address] [place], [place], [address] Phone: [phone]   Fax:  [phone]  Speech Language Pathology Evaluation  Patient Details  Name: Kyle Cameron MRN: [id number] Date of Birth: Jan 31, 1926 Referring Provider: Jannifer Franklin, Dr. Lala Lund.  Encounter Date: 11/16/2014      End of Session - 11/16/14 1007    Visit Number 1   Number of Visits 17   Date for SLP Re-Evaluation 01/15/15   SLP Start Time 0848   SLP Stop Time  0931   SLP Time Calculation (min) 43 min   Activity Tolerance Patient tolerated treatment well      Past Medical History  Diagnosis Date  . Hypertension   . High cholesterol   . Prostate atrophy   . GERD (gastroesophageal reflux disease)   . History of diverticulitis of colon   . Benign enlargement of prostate   . Hypothyroidism   . History of renal calculi   . Parkinson's disease   . Renal calculi   . CVA (cerebral infarction)   . Stroke   . Anticoagulated on Coumadin   . Headache   . Labyrinthitis   . Grover's disease   . Cataracts, bilateral   . Parkinson's disease   . Amaurosis fugax 08/16/2014    Right eye    Past Surgical History  Procedure Laterality Date  . Cholecystectomy  02/04/2012    Procedure: LAPAROSCOPIC CHOLECYSTECTOMY WITH INTRAOPERATIVE CHOLANGIOGRAM;  Surgeon: Merrie Roof, MD;  Location: [facility];  Service: General;  Laterality: N/A;  . Lithotripsy      renal calculi  . Tonsillectomy    . Cataract extraction, bilateral      There were no vitals filed for this visit.  Visit Diagnosis: Hypokinetic Parkinsonian dysphonia      Subjective Assessment - 11/16/14 0905    Subjective "I'm tired of all of this (to do with Parkinson's)."   Currently in Pain? No/denies            SLP Evaluation [facility] - 11/16/14 2297    SLP Visit Information   SLP Received On 11/16/14   Referring Provider Jannifer Franklin, Dr. Lala Lund.   Medical Diagnosis Parkinson's Disease    General Information   Other Pertinent Information Pt with PMH of CVA in Oct 2015   Prior Functional Status   Cognitive/Linguistic Baseline Baseline deficits   Cognition   Overall Cognitive Status History of cognitive impairments - at baseline   Auditory Comprehension   Overall Auditory Comprehension Appears within functional limits for tasks assessed   Verbal Expression   Overall Verbal Expression Impaired at baseline   Other Verbal Expression Comments Verbal expression skills (anomia) has decr'd since last discharge from ST    Oral Motor/Sensory Function   Overall Oral Motor/Sensory Function Impaired   Labial Strength Reduced Right   Labial Coordination Reduced   Lingual Symmetry Within Functional Limits   Lingual Strength Reduced Right   Lingual Coordination Reduced   Facial ROM Within Functional Limits   Facial Strength Within Functional Limits   Overall Oral Motor/Sensory Function Pt with mild decr'd strength on rt with labial, lingual structures   Motor Speech   Overall Motor Speech Impaired at baseline   Phonation Low vocal intensity;Breathy   Articulation Impaired   Level of Impairment Sentence   Intelligibility Intelligibility reduced   Sentence 75-100% accurate   Conversation 75-100% accurate  >95% quiet room, 90% mod noisy environment focused listening   Motor Planning Impaired   Level of  Impairment Education administrator;Inconsistent   Interfering Components Premorbid status   Effective Techniques Increased vocal intensity;Slow rate  Pt with much difficulty with rate reduction due to parkinson      Five minutes of conversational speech was reduced today, at average 64.5dB (WNL= average 70-72dB) with range of 60-69dB, when a sound level meter was placed 30 cm away from pt's mouth. Overall intelligibility for this listener in a quiet environment was approx 95%. Production of loud /a/ averaged 82dB (range of 77 to 84) and min-mod cues occasionally  needed for loudness.   In short question and answer tasks, pt was asked to use the same amount of effort as with loud /a/. Loudness average with this increased effort was 68dB (range of 64 to 71) with min A occasionally needed for loudness. Pt would benefit from skilled ST in order to improve speech intelligibility and pt's QOL.                    SLP Education - November 27, 2014 1006    Education provided Yes   Education Details loud "ah", WNL volume, environmental compensations for improving intelligibility   Person(s) Educated Patient   Methods Explanation   Comprehension Verbalized understanding;Need further instruction;Verbal cues required          SLP Short Term Goals - 11-27-14 1011    SLP SHORT TERM GOAL #1   Title pt will complete loud /a/ with average 82dB over 4 sessions with rare min A   Time 4   Period Weeks   Status New   SLP SHORT TERM GOAL #2   Title Pt will improve loudness in 5 minutes simple conversation to average 68dB over two sessions   Time 4   Period Weeks   Status New          SLP Long Term Goals - 11-27-2014 1336    SLP LONG TERM GOAL #1   Title pt will sustain loud /a/ across 5 reps at average 83dB over four sessions   Time 8   Period Weeks   Status New   SLP LONG TERM GOAL #2   Title pt will demo average 68dB in 8 minutes simple conversation over two sessions   Time 8   Period Weeks   Status New   SLP LONG TERM GOAL #3   Title pt will achieve conversational speech intelligibility of 95% in min noisy environment over three sessions   Time 8   Period Weeks   Status New          Plan - November 27, 2014 1008    Clinical Impression Statement Pt presents with cont reduced intelligibility due to changes in voice from Parkinson's disease. 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 Skilled ST req'd to improve pt's intelligibility and educate pt/caregiver re: environamental  compensations to improve intelligiblity.   Speech Therapy Frequency 2x / week   Duration --  8 weeks   Treatment/Interventions SLP instruction and feedback;Compensatory strategies;Internal/external aids;Patient/family education;Functional tasks;Cueing hierarchy   Potential to Achieve Goals Fair   Potential Considerations Severity of impairments;Previous level of function          G-Codes - 27-Nov-2014 1350    Functional Assessment Tool Used noms-4 (50%)   Functional Limitations Motor speech   Motor Speech Current Status [id number]) At least 40 percent but less than 60 percent impaired, limited or restricted   Motor Speech Goal Status (  G9186) At least 40 percent but less than 60 percent impaired, limited or restricted      Problem List Patient Active Problem List   Diagnosis Date Noted  . Amaurosis fugax 08/16/2014  . Cerebral infarction due to embolism of left middle cerebral artery ([facility]) 06/22/2014  . Discoloration of skin of foot 03/15/2014  . PD (Parkinson's disease) ([facility]) 03/15/2014  . Cerebral infarction due to embolism of cerebral artery ([place]) 03/15/2014  . Essential hypertension 03/15/2014  . HLD (hyperlipidemia) 03/15/2014  . Cough   . LV (left ventricular) mural thrombus ([facility])   . CVA (cerebral infarction) 01/18/2014  . Imbalance 01/18/2014  . Slurring of speech   . Abnormality of gait 03/12/2012  . Paralysis agitans ([place]) 03/12/2012  . Acute cholecystitis 02/04/2012  . Hypertension 02/04/2012  . Hyperlipidemia 02/04/2012    [facility] , [place], CCC-SLP    11/16/2014, 1:52 PM  [facility] [address] [place], [place], [address] Phone: [phone]   Fax:  [phone]  Name: Kyle Cameron MRN: [id number] Date of Birth: 1926/12/14

## 2014-11-16 NOTE — Patient Instructions (Signed)
Complete 6 loud "ah" twice per day. Think about shouting when you talk. You wont' be shouting, but your voice will be closer to normal volume.

## 2014-11-16 NOTE — Therapy (Signed)
[facility] [address] [place], [place], [address] Phone: [phone]   Fax:  [phone]  Physical Therapy Evaluation  Patient Details  Name: Kyle Cameron MRN: [id number] Date of Birth: [birth year] Referring Provider: Lenor Coffin  Encounter Date: 11/16/2014      PT End of Session - 11/16/14 1324    Visit Number 1  New PT episode starting 11/16/14   Number of Visits 17   Date for PT Re-Evaluation 01/15/15   Authorization Type Medicare G-code every 10th visit   PT Start Time 0944   PT Stop Time 1016   PT Time Calculation (min) 32 min   Equipment Utilized During Treatment Gait belt   Activity Tolerance Patient tolerated treatment well   Behavior During Therapy [facility] for tasks assessed/performed      Past Medical History  Diagnosis Date  . Hypertension   . High cholesterol   . Prostate atrophy   . GERD (gastroesophageal reflux disease)   . History of diverticulitis of colon   . Benign enlargement of prostate   . Hypothyroidism   . History of renal calculi   . Parkinson's disease   . Renal calculi   . CVA (cerebral infarction)   . Stroke   . Anticoagulated on Coumadin   . Headache   . Labyrinthitis   . Grover's disease   . Cataracts, bilateral   . Parkinson's disease   . Amaurosis fugax 08/16/2014    Right eye    Past Surgical History  Procedure Laterality Date  . Cholecystectomy  02/04/2012    Procedure: LAPAROSCOPIC CHOLECYSTECTOMY WITH INTRAOPERATIVE CHOLANGIOGRAM;  Surgeon: Merrie Roof, MD;  Location: [facility];  Service: General;  Laterality: N/A;  . Lithotripsy      renal calculi  . Tonsillectomy    . Cataract extraction, bilateral      There were no vitals filed for this visit.  Visit Diagnosis:  Abnormality of gait - Plan: PT plan of care cert/re-cert  Bradykinesia - Plan: PT plan of care cert/re-cert  Rigidity - Plan: PT plan of care cert/re-cert  Festinating gait -  Plan: PT plan of care cert/re-cert      Subjective Assessment - 11/16/14 0946    Subjective Have not yet gotten the apomorphine yet-finally approved, so I shoulde get that next week.  Walking is most difficult due to the "sticking".  I just can't get started.  He has had one fall into the shower.  He has a 4-wheeled RW and a cane.   Pertinent History CVA 12/26/13 and 01/18/14, Parkinson's disease   Patient Stated Goals Pt's goal for therapy is to have therapy in conjunction with Apomorphine to walk better.   Currently in Pain? No/denies            [facility] PT Assessment - 11/16/14 0949    Assessment   Medical Diagnosis Parkinson's disease  Freezing of gait   Referring Provider Lenor Coffin   Onset Date/Surgical Date --  screen date 10/20/14   Precautions   Precautions Fall  significant freezing of gait   Balance Screen   Has the patient fallen in the past 6 months Yes   How many times? 1   Has the patient had a decrease in activity level because of a fear of falling?  Yes   Is the patient reluctant to leave their home because of a fear of falling?  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   Posture/Postural Control Postural limitations   Postural Limitations Forward head;Rounded Shoulders   Strength   Overall Strength Comments Grossly tested at least 4/5  bilateral lower extremities   Transfers   Transfers Sit to Stand;Stand to Sit  Pt describes difficulty with low surfaces transfers   Sit to Stand 4: Min guard;With upper extremity assist;Without upper extremity assist;With armrests;From chair/3-in-1   Five time sit to stand comments  5x sit<>stand:  17.08 with posterior lean first 3 reps, needs UE support 4th rep   Stand to Sit 4: Min guard;With upper extremity assist;Without upper extremity assist;To chair/3-in-1   Ambulation/Gait   Ambulation/Gait Yes   Ambulation/Gait Assistance 4: Min guard   Ambulation/Gait Assistance Details Significant festinating/freezing of gait with initiation of gait and turns   Ambulation Distance (Feet) 80 Feet  x 2   Assistive device Straight cane  during eval; uses 4-wheeled RW to walk back into gym   Gait Pattern Decreased step length - right;Decreased step length - left;Decreased dorsiflexion - right;Decreased dorsiflexion - left;Festinating;Narrow base of support;Trunk flexed;Poor foot clearance - left;Poor foot clearance - right   Ambulation Surface Level;Indoor   Gait velocity 16.76 sec = 1.96 ft/sec   Timed Up and Go Test   TUG Normal TUG   Normal TUG (seconds) --  >40 sec to initiate TUG; stopped after 2 min d/t freezing                             PT Short Term Goals - 11/16/14 1331    PT SHORT TERM GOAL #1   Title Pt will perform HEP with family/caregiver supervision for improved transfers, balance and gait.  TARGET 12/16/14   Time 4   Period Weeks   Status New   PT SHORT TERM GOAL #2   Title Pt will improve 5x sit<>stand to less than or equal to 15 seconds with no posterior lean for improved transfer efficiency and safety.   Time 4   Period Weeks   Status New   PT SHORT TERM GOAL #3   Title Pt will verbalize/demonstrate understanding of techniques to reduce freezing episodes with gait.   Time 4   Period Weeks   Status New   PT SHORT TERM GOAL #4   Title Pt will  be able to initiate gait within less than 30 seconds upon standing    Time 4   Period Weeks   Status New           PT Long Term Goals - 11/16/14 1335    PT LONG TERM GOAL #1   Title Pt will verbalize/demonstrate understanding of fall prevention techniques within the home environment.  (TARGET 01/15/15)   Time 8   Period Weeks   Status New   PT LONG TERM GOAL #2   Title Pt will improve  gait velocity to at least 2.3 ft/sec for improved efficiency and safety with gait.   Time 8   Period Weeks   Status New   PT LONG TERM GOAL #3   Title Pt will be able to complete TUG test in less than 2 minutes, to demonstrate improved short distance functional mobility within the home.   Time 8   Period Weeks   Status New   PT LONG TERM GOAL #4   Title Further assessment for assistive device to be completed for additional safety within home and community.   Time 8   Period Weeks   Status New   PT LONG TERM GOAL #5   Title Pt will verbalize plans for continued community fitness upon D/C from PT.   Time 8   Period Weeks   Status New               Plan - 2014-12-05 1325    Clinical Impression Statement Pt is an 81 year old male who presents to OP PT with Parkinson's disease, with progressive difficulty with festination and freezing of gait.  Pt has difficulty with initiation of gait and turns.  He reports at least one fall in the past  months.  He has been going through the process over the past several months with Dr. Jannifer Franklin to get the Apomophine shot for dopamine rescue during off times.  Pt presents as fall risk per difficulty with TUG (unable to complete after 2 minutes due to freezing of gait during turn), pt has several losses of balance, requiring therapist assist to regain balance.  Pt has difficulty with transfers, with timing/coordination with gait and turns, decreased balance, postural instability.  Pt would benefit from skilled PT to address balance, gait, transfers for improved  functional mobility.   Pt will benefit from skilled therapeutic intervention in order to improve on the following deficits Abnormal gait;Decreased activity tolerance;Decreased balance;Decreased mobility;Decreased strength;Difficulty walking;Postural dysfunction   Rehab Potential Good   PT Frequency 2x / week   PT Duration 8 weeks  plus eval   PT Treatment/Interventions ADLs/Self Care Home Management;Therapeutic exercise;Therapeutic activities;Functional mobility training;Gait training;DME Instruction;Balance training;Neuromuscular re-education;Patient/family education   PT Next Visit Plan Discuss, practice with pt and [name], strategies to reduce freezing with gait; practice weigthshifting, dynamic single limb stance activities; transfers   Consulted and Agree with Plan of Care Patient          G-Codes - Dec 05, 2014 1342    Functional Assessment Tool Used 5x sit<>stand 17.08 seconds, TUG unable to complete-stopped at 2 minutes, >40 seconds to initiate gait upon standing; gait velocity 1.96 ft/sec   Functional Limitation Mobility: Walking and moving around   Mobility: Walking and Moving Around Current Status (K5993) At least 40 percent but less than 60 percent impaired, limited or restricted   Mobility: Walking and Moving Around Goal Status [phone]) At least 20 percent but less than 40 percent impaired, limited or restricted       Problem List Patient Active Problem List   Diagnosis Date Noted  . Amaurosis fugax 08/16/2014  . Cerebral infarction due to embolism of left middle cerebral artery ([place]) 06/22/2014  . Discoloration of skin of foot 03/15/2014  . PD (Parkinson's disease) ([place]) 03/15/2014  . Cerebral infarction due to embolism of cerebral artery ([place]) 03/15/2014  . Essential hypertension 03/15/2014  . HLD (hyperlipidemia) 03/15/2014  . Cough   . LV (left ventricular) mural thrombus ([place])   . CVA (cerebral infarction) 01/18/2014  . Imbalance 01/18/2014  .  Slurring of speech   .  Abnormality of gait 03/12/2012  . Paralysis agitans ([place]) 03/12/2012  . Acute cholecystitis 02/04/2012  . Hypertension 02/04/2012  . Hyperlipidemia 02/04/2012    Dezarai Prew W. 11/16/2014, 1:48 PM Frazier Butt., PT [facility] [address] [place], [place], [address] Phone: [phone]   Fax:  [phone]  Name: Kyle Cameron MRN: [id number] Date of Birth: Jun 18, 1926

## 2014-11-17 NOTE — Telephone Encounter (Signed)
Benefits Explanation form received from [place] at [facility].  This reiterates the message that was taken today.  States Pharmacy Benefit Carrier is [employer], Medication is covered with no Prior Auth needed.  Next step noted: [facility] will contact patient.  Says they are doing their best to ensure the treatment is affordable for the patient.  Nothing further is needed from [place] at this time.

## 2014-11-17 NOTE — Telephone Encounter (Signed)
Phone call from Rosalie [phone] called to advise insurance will cover [place]. Patient was approved to cover copay, she is working on copay card for patient. Patient doesn't qualify for assistance, can't use with commercial. Sharyn Lull will be sending over form.

## 2014-11-22 ENCOUNTER — Telehealth: Payer: Self-pay | Admitting: Neurology

## 2014-11-22 NOTE — Telephone Encounter (Signed)
I called the patient's caregiver and relayed Jessica's message. I also gave him [facility]'s phone number [phone]).

## 2014-11-22 NOTE — Telephone Encounter (Signed)
I called the patient's caregiver. I have not received anything from a specialty pharmacy for this patient. He gave me the number for [facility] that he used [phone]). I called and spoke to [facility]. She told me that the medication is only sold for inpatient use and this patient would not qualify. She gave me the number to the specialty pharmacy [phone]). I called and spoke to [facility]. He stated the patient has a retail only account and would not be able to get this medication through specialty pharmacy mail order. I am forwarding this to Janett Billow to see if she has anymore information on this before calling the patient's caregiver back.

## 2014-11-22 NOTE — Telephone Encounter (Signed)
I called the number provided, which was for [facility].  Spoke with [employer].  She reviewed file and sees we already called earlier today.  Indicated nothing further is needed, and asked that we disregard this message.

## 2014-11-22 NOTE — Telephone Encounter (Signed)
I called [facility] back.  Spoke with Remo Lipps.  They triaged the Rx to [facility].  I called [facility].  Spoke with Zella Ball.  She said they were not able to fill the Rx, so they sent it back to [facility].  Called [facility] again.  Spoke with Di Kindle.  She reviewed file and verified the request was sent back to them, and re-triaged to a different Specialty Pharmacy, [facility].  I called [facility] at [phone].  Spoke with Gregary Signs.  She verified they were triaged the order, and are currently processing it.  I asked that she please confirm they would be able to fill this Rx since the last specialty pharmacy was not able to do so.  They ran a test claim, and verified it did pay, therefore, they will be able to dispense this med.  Says the pharmacist is just reviewing the med list, allergies and Rx, then the prescription should be released.  They will contact the patient (or caregiver) to confirm delivery.  Stated nothing further is needed at this time.

## 2014-11-22 NOTE — Telephone Encounter (Signed)
Alex, rep with [facility] called sts the form that was faxed was not clear. Please call to give verbal order for medication to ship. Please call [phone]

## 2014-11-22 NOTE — Telephone Encounter (Signed)
[name], the patient's caretaker, is calling and states that [place] has tried to contact us in regard to clarification of script for Rx apomorphine hydrochloride 10 mg/mc so that the injection can be shipped.  Thanks!

## 2014-11-23 ENCOUNTER — Ambulatory Visit: Payer: Medicare Other | Attending: Neurology | Admitting: Occupational Therapy

## 2014-11-23 DIAGNOSIS — R29898 Other symptoms and signs involving the musculoskeletal system: Secondary | ICD-10-CM | POA: Diagnosis present

## 2014-11-23 DIAGNOSIS — M6281 Muscle weakness (generalized): Secondary | ICD-10-CM | POA: Diagnosis present

## 2014-11-23 DIAGNOSIS — R279 Unspecified lack of coordination: Secondary | ICD-10-CM | POA: Diagnosis present

## 2014-11-23 DIAGNOSIS — R490 Dysphonia: Secondary | ICD-10-CM | POA: Diagnosis present

## 2014-11-23 DIAGNOSIS — R269 Unspecified abnormalities of gait and mobility: Secondary | ICD-10-CM | POA: Insufficient documentation

## 2014-11-23 DIAGNOSIS — R258 Other abnormal involuntary movements: Secondary | ICD-10-CM

## 2014-11-23 DIAGNOSIS — Z7409 Other reduced mobility: Secondary | ICD-10-CM

## 2014-11-23 DIAGNOSIS — R6889 Other general symptoms and signs: Secondary | ICD-10-CM

## 2014-11-23 DIAGNOSIS — R4701 Aphasia: Secondary | ICD-10-CM | POA: Insufficient documentation

## 2014-11-23 DIAGNOSIS — R2689 Other abnormalities of gait and mobility: Secondary | ICD-10-CM

## 2014-11-23 NOTE — Therapy (Signed)
[facility] [address] [place], [place], [address] Phone: [phone]   Fax:  [phone]  Occupational Therapy Evaluation  Patient Details  Name: Kyle Cameron MRN: [id number] Date of Birth: 08/08/1926 Referring Provider: Dr. Jannifer Franklin  Encounter Date: 11/23/2014      OT End of Session - 11/23/14 1451    Visit Number 1   Number of Visits 17   Date for OT Re-Evaluation 01/21/15   Authorization Type 1Medicare, 2AARP, G-code needed KX!   OT Start Time 0848   OT Stop Time 0926   OT Time Calculation (min) 38 min   Activity Tolerance Patient tolerated treatment well   Behavior During Therapy [facility] for tasks assessed/performed      Past Medical History  Diagnosis Date  . Hypertension   . High cholesterol   . Prostate atrophy   . GERD (gastroesophageal reflux disease)   . History of diverticulitis of colon   . Benign enlargement of prostate   . Hypothyroidism   . History of renal calculi   . Parkinson's disease   . Renal calculi   . CVA (cerebral infarction)   . Stroke   . Anticoagulated on Coumadin   . Headache   . Labyrinthitis   . Grover's disease   . Cataracts, bilateral   . Parkinson's disease   . Amaurosis fugax 08/16/2014    Right eye    Past Surgical History  Procedure Laterality Date  . Cholecystectomy  02/04/2012    Procedure: LAPAROSCOPIC CHOLECYSTECTOMY WITH INTRAOPERATIVE CHOLANGIOGRAM;  Surgeon: Merrie Roof, MD;  Location: [facility];  Service: General;  Laterality: N/A;  . Lithotripsy      renal calculi  . Tonsillectomy    . Cataract extraction, bilateral      There were no vitals filed for this visit.  Visit Diagnosis:  Bradykinesia - Plan: Ot plan of care cert/re-cert  Rigidity - Plan: Ot plan of care cert/re-cert  Hypokinesia - Plan: Ot plan of care cert/re-cert  Generalized muscle weakness - Plan: Ot plan of care cert/re-cert  Decreased functional mobility and endurance - Plan:  Ot plan of care cert/re-cert  Festinating gait - Plan: Ot plan of care cert/re-cert      Subjective Assessment - 11/23/14 1446    Subjective  Pt reports difficulties with walking and freezing episodes   Pertinent History Pt with PD (diagnosed 3 years ago) with 2 CVAs  Pt reports PD worse after CVA.  Pt reports increased difficulty with speech, R hand, and walking.   Patient Stated Goals improve balance/ walking, use of RUE   Currently in Pain? No/denies           [facility] OT Assessment - 11/23/14 0001    Assessment   Diagnosis Parkinson's disease, CVA   Referring Provider Dr. Jannifer Franklin   Onset Date 10/20/14  diagnosed with PD 3 years ago,    Assessment Pt with PD and hx of CVA's presents with a decline in RUEfunctional use, functional mobility and ADL performance. Pt demonstrates LOB and freezing episodes.   Prior Therapy  OT, PT, ST   Precautions   Precautions Fall   Balance Screen   Has the patient fallen in the past 6 months Yes   How many times? 1   Has the patient had a decrease in activity level because of a fear of falling?  No   Is the patient reluctant to leave their home because of a fear of falling?  No  Home  Environment   Family/patient expects to be discharged to: Private residence   [place] in   Lives With Other (Vincent)  friend   Prior Function   Level of Independence Independent with basic ADLs;Independent with household mobility with device   Vocation Retired   Leisure exercises 3x/wk-[employer]-rides bike and has trainer   ADL   Eating/Feeding Needs assist with cutting food   Grooming Modified independent   Upper Body Bathing Modified independent   Lower Body Bathing Modified independent   Upper Body Dressing Increased time  Pt requrired assist donning jacket   Lower Body Dressing Increased time;Needs assist for fasteners  Pt reports performing modified independently   Toilet Tranfer Modified independent   per pt report   Tub/Shower Transfer Modified independent  per pt report, however pt is at increased risk for falls.   Transfers/Ambulation Related to ADL's Pt demonstrated freezing episodes and LOB with transferring from transport chair to standard chair.   ADL comments Pt reports he is modified indpendent with basic ADLS however he can benefit from supervision due to fall risk. therapsit has recommended that pt perform dressing and bathing seaed to minimize fall risk   IADL   Light Housekeeping --  Pt's friend performs   Meal Prep --  Needs assist with meal prep   Written Expression   Dominant Hand Right   Handwriting Mild micrographia;Increased time;25% legible   Vision Assessment   Vision Assessment Vision not tested  Denies visual changes   Cognition   Overall Cognitive Status Impaired/Different from baseline  Pt was impulsive throughout eval   Behaviors Impulsive   Observation/Other Assessments   Simulated Eating Time (seconds) 15.72 secs(PPT#2)   Donning Doffing Jacket Time (seconds) --  Pt required assist with donning his own jacket in standing   Coordination   Gross Motor Movements are Fluid and Coordinated No   Fine Motor Movements are Fluid and Coordinated No   Right 9 Hole Peg Test  102 secs with multiple drops   Left 9 Hole Peg Test 37.78 secs   Box and Blocks R 21 blocks, L 39 blocks   Tremors bilateral, right worse than left   AROM   Overall AROM  Deficits   Overall AROM Comments RUE shoulder flexion 105, LUE shoulder flexion 120, right elbow ext: -18, LUE elbow ext WFLS                            OT Short Term Goals - 11/23/14 1505    OT SHORT TERM GOAL #1   Title Pt will be independent with updated HEP.--due 12/31/14   Time 4   Period Weeks   Status New   OT SHORT TERM GOAL #2   Title Pt will improve coordination as shown by completing 9-hole peg test in 90 secs or less with dominant R hand.-   Baseline 102 secs   Time 4   Period  Weeks   Status New   OT SHORT TERM GOAL #3   Title Pt will be able to write at least 3 sentences with at least 50% legibility.   Time 4   Period Weeks   Status New   OT SHORT TERM GOAL #4   Title Pt will demonstrate ability to perfom RUE elbow extension at -10 for improved functional reach   Baseline baseline -18   Time 4   Period Weeks  Status New   OT SHORT TERM GOAL #5   Title ------------------------------------------------------------------------------------------------------------------------------------------           OT Long Term Goals - 04-Dec-2014 1507    OT LONG TERM GOAL #1   Title Pt will verbalize understanding of adaptive strategies for increased safety and independence with  ADLs/IADLs prn    Status New   OT LONG TERM GOAL #2   Title Pt will improve coordination as shown by completing 9-hole peg test in 60 secs or less with right dominant hand   Status New   OT LONG TERM GOAL #3   Title Pt will demonstrate improved functional reaching/ coordination for ADLs as evidenced by improving box/ blocks score to 26 blocks for RUE.   Status New   OT LONG TERM GOAL #4   Title Pt will demonstrate ability to retrieve lightweight items at 110 shoulder flexion with RUE.   Baseline baseline 105 for RUE   OT LONG TERM GOAL #5   Title Pt will demonstrate ability to perfrom PPT# 4(donning jacket in 25 secs or less without loss of balance)   Time 8   Period Weeks   Status New   OT LONG TERM GOAL #6   Title Pt will perform simple cooking/ home management in standing with supervision without LOB.   Time 8   Period Weeks   Status New               Plan - December 04, 2014 1449    Clinical Impression Statement Pt with history of 2 CVA's and PD returns to occupational therapy with a decline in functional mobility, RUE  Coordination/ functional use and ADLS. Pt can benefit from skilled occupational therapy.(Pt is working with Dr.Willis to start using the Amophine shot for dopamine  rescue during medication off times).   Pt will benefit from skilled therapeutic intervention in order to improve on the following deficits (Retired) Decreased balance;Decreased knowledge of use of DME;Impaired UE functional use;Impaired perceived functional ability;Decreased strength;Decreased mobility;Decreased activity tolerance;Impaired tone;Impaired sensation;Decreased safety awareness;Decreased endurance;Decreased coordination   Rehab Potential Good   Clinical Impairments Affecting Rehab Potential speed and timing.   OT Frequency 2x / week   OT Duration 8 weeks   OT Treatment/Interventions Self-care/ADL training;Moist Heat;DME and/or AE instruction;Fluidtherapy;Splinting;Patient/family education;Balance training;Therapeutic exercises;Therapeutic exercise;Therapeutic activities;Functional Mobility Training;Neuromuscular education;Cryotherapy;Energy conservation;Manual Therapy;Electrical Stimulation   Plan update HEP for coordination RUE, PWR! exercises   Consulted and Agree with Plan of Care Patient          G-Codes - 12-04-14 1516    Functional Assessment Tool Used  9 hole peg test: RUE 102 secs, LUE 37.78 secs box/ blocks RUE 21 blocks, LUE 39 blocks   Functional Limitation Carrying, moving and handling objects   Carrying, Moving and Handling Objects Current Status (P6195) At least 40 percent but less than 60 percent impaired, limited or restricted   Carrying, Moving and Handling Objects Goal Status (K9326) At least 20 percent but less than 40 percent impaired, limited or restricted      Problem List Patient Active Problem List   Diagnosis Date Noted  . Amaurosis fugax 08/16/2014  . Cerebral infarction due to embolism of left middle cerebral artery ([place]) 06/22/2014  . Discoloration of skin of foot 03/15/2014  . PD (Parkinson's disease) ([place]) 03/15/2014  . Cerebral infarction due to embolism of cerebral artery ([place]) 03/15/2014  . Essential hypertension 03/15/2014  . HLD  (hyperlipidemia) 03/15/2014  . Cough   . LV (left ventricular) mural thrombus ([place])   .  CVA (cerebral infarction) 01/18/2014  . Imbalance 01/18/2014  . Slurring of speech   . Abnormality of gait 03/12/2012  . Paralysis agitans ([place]) 03/12/2012  . Acute cholecystitis 02/04/2012  . Hypertension 02/04/2012  . Hyperlipidemia 02/04/2012    RINE,KATHRYN 11/23/2014, 3:27 PM  [facility] [address] [place], [place], [address] Phone: [phone]   Fax:  [phone]  Name: Ikechukwu Cerny MRN: [id number] Date of Birth: 03/14/1926

## 2014-11-28 ENCOUNTER — Ambulatory Visit: Payer: Medicare Other | Admitting: Physical Therapy

## 2014-11-28 ENCOUNTER — Ambulatory Visit: Payer: Medicare Other | Admitting: Occupational Therapy

## 2014-11-28 DIAGNOSIS — R29898 Other symptoms and signs involving the musculoskeletal system: Secondary | ICD-10-CM

## 2014-11-28 DIAGNOSIS — R269 Unspecified abnormalities of gait and mobility: Secondary | ICD-10-CM

## 2014-11-28 DIAGNOSIS — R279 Unspecified lack of coordination: Secondary | ICD-10-CM

## 2014-11-28 DIAGNOSIS — Z7409 Other reduced mobility: Secondary | ICD-10-CM

## 2014-11-28 DIAGNOSIS — R2689 Other abnormalities of gait and mobility: Secondary | ICD-10-CM

## 2014-11-28 DIAGNOSIS — R258 Other abnormal involuntary movements: Secondary | ICD-10-CM

## 2014-11-28 NOTE — Patient Instructions (Addendum)
"  I love a Database administrator    Using a chair if necessary, march in place each leg-high knees, brief hold in the air. Repeat __10__ times, 2 sets. Do __2__ sessions per day.  http://gt2.exer.us/344   Copyright  VHI. All rights reserved.   Also provided patient with the following:  -PWR! Based handout on weightshifting and lifting alternating legs to reduce freezing episodes with initiation of gait and with turns, x 10 reps, 2-3 times per day.  -PWR! Based handout on sit<>stand transfers, to be performed 5-10 reps 2-3 times per day (paying attention to proper technique of increased forward lean)

## 2014-11-28 NOTE — Patient Instructions (Signed)
Coordination Exercises  Perform the following exercises for 20 minutes 1 times per day. Perform with both hand(s). Perform using big, deliberate, slow movments.   Flipping Cards: Place deck of cards on the table. Flip cards over by opening your hand big to grasp and then turn your palm up big.  Deal cards: Hold 1/2 or whole deck in your hand. Use thumb to push card off top of deck with one big push.  Perform "Flicks"/hand stretches (PWR! Hands): Close hands then flick out your fingers with focus on opening hands, pulling wrists back, and extending elbows like you are pushing.

## 2014-11-28 NOTE — Therapy (Signed)
[facility] [address] [place], [place], [address] Phone: [phone]   Fax:  [phone]  Occupational Therapy Treatment  Patient Details  Name: Kyle Cameron MRN: [id number] Date of Birth: 1926-12-11 Referring Provider: Dr. Jannifer Franklin  Encounter Date: 11/28/2014      OT End of Session - 11/28/14 1205    Visit Number 2   Number of Visits 17   Date for OT Re-Evaluation 01/21/15   Authorization Type 1Medicare, 2AARP, G-code needed   Authorization Time Period KX !   Authorization - Visit Number 2   Authorization - Number of Visits 10   OT Start Time 1102   OT Stop Time 1145   OT Time Calculation (min) 43 min   Activity Tolerance Patient tolerated treatment well   Behavior During Therapy Impulsive      Past Medical History  Diagnosis Date  . Hypertension   . High cholesterol   . Prostate atrophy   . GERD (gastroesophageal reflux disease)   . History of diverticulitis of colon   . Benign enlargement of prostate   . Hypothyroidism   . History of renal calculi   . Parkinson's disease   . Renal calculi   . CVA (cerebral infarction)   . Stroke   . Anticoagulated on Coumadin   . Headache   . Labyrinthitis   . Grover's disease   . Cataracts, bilateral   . Parkinson's disease   . Amaurosis fugax 08/16/2014    Right eye    Past Surgical History  Procedure Laterality Date  . Cholecystectomy  02/04/2012    Procedure: LAPAROSCOPIC CHOLECYSTECTOMY WITH INTRAOPERATIVE CHOLANGIOGRAM;  Surgeon: Merrie Roof, MD;  Location: [facility];  Service: General;  Laterality: N/A;  . Lithotripsy      renal calculi  . Tonsillectomy    . Cataract extraction, bilateral      There were no vitals filed for this visit.  Visit Diagnosis:  Bradykinesia  Rigidity  Decreased functional mobility and endurance  Lack of coordination      Subjective Assessment - 11/28/14 1150    Subjective  Pt reports that he has difficulty  opening candy wrappers and buttons   Pertinent History Pt with PD (diagnosed 3 years ago) with 2 CVAs  Pt reports PD worse after CVA.  Pt reports increased difficulty with speech, R hand, and walking.   Patient Stated Goals improve balance/ walking, use of RUE   Currently in Pain? No/denies                      OT Treatments/Exercises (OP) - 11/28/14 1152    ADLs   Overall ADLs Instructed pt in how freezing can also happen with UEs and speech and discussed/instructed in strategies to decr freezing.  Pt verbalized understanding.   UB Dressing Reviewed strategy for buttoning (verbally) using PWR! hands or button hook, or keeping most buttons fastened an don overhead.  Pt verbalized understanding.   Functional Mobility Reinforced strategies to decr freezing episodes with min-mod cues during session.                OT Education - 11/28/14 1150    Education provided Yes   Education Details PWR! Moves in supine (basic 4 without hip lift during PWR! steps); PWR! hands/initial coordination HEP   Person(s) Educated Patient   Methods Explanation;Demonstration;Verbal cues;Handout   Comprehension Verbal cues required;Returned demonstration;Verbalized understanding;Need further instruction;Tactile cues required  mod-max cues for coordination HEP, mod  cues for others to reinforce big movements          OT Short Term Goals - 11/23/14 1505    OT SHORT TERM GOAL #1   Title Pt will be independent with updated HEP.--due 12/31/14   Time 4   Period Weeks   Status New   OT SHORT TERM GOAL #2   Title Pt will improve coordination as shown by completing 9-hole peg test in 90 secs or less with dominant R hand.-   Baseline 102 secs   Time 4   Period Weeks   Status New   OT SHORT TERM GOAL #3   Title Pt will be able to write at least 3 sentences with at least 50% legibility.   Time 4   Period Weeks   Status New   OT SHORT TERM GOAL #4   Title Pt will demonstrate ability to  perfom RUE elbow extension at -10 for improved functional reach   Baseline baseline -18   Time 4   Period Weeks   Status New   OT SHORT TERM GOAL #5   Title ------------------------------------------------------------------------------------------------------------------------------------------           OT Long Term Goals - 11/23/14 1507    OT LONG TERM GOAL #1   Title Pt will verbalize understanding of adaptive strategies for increased safety and independence with  ADLs/IADLs prn    Status New   OT LONG TERM GOAL #2   Title Pt will improve coordination as shown by completing 9-hole peg test in 60 secs or less with right dominant hand   Status New   OT LONG TERM GOAL #3   Title Pt will demonstrate improved functional reaching/ coordination for ADLs as evidenced by improving box/ blocks score to 26 blocks for RUE.   Status New   OT LONG TERM GOAL #4   Title Pt will demonstrate ability to retrieve lightweight items at 110 shoulder flexion with RUE.   Baseline baseline 105 for RUE   OT LONG TERM GOAL #5   Title Pt will demonstrate ability to perfrom PPT# 4(donning jacket in 25 secs or less without loss of balance)   Time 8   Period Weeks   Status New   OT LONG TERM GOAL #6   Title Pt will perform simple cooking/ home management in standing with supervision without LOB.   Time 8   Period Weeks   Status New               Plan - 11/28/14 1205    Clinical Impression Statement Pt with movement impulsivity and freezing with ambulation and UE tasks.  Pt demo difficulty with utilizing strategies for freezing and needed significant verbal and tactile cues.   Plan continue with updated HEP   OT Home Exercise Plan Education issued:  PWR! supine (basic 4), PWR! hands, card coordination HEP   Consulted and Agree with Plan of Care Patient        Problem List Patient Active Problem List   Diagnosis Date Noted  . Amaurosis fugax 08/16/2014  . Cerebral infarction due to  embolism of left middle cerebral artery ([place]) 06/22/2014  . Discoloration of skin of foot 03/15/2014  . PD (Parkinson's disease) ([place]) 03/15/2014  . Cerebral infarction due to embolism of cerebral artery ([place]) 03/15/2014  . Essential hypertension 03/15/2014  . HLD (hyperlipidemia) 03/15/2014  . Cough   . LV (left ventricular) mural thrombus ([place])   . CVA (cerebral infarction) 01/18/2014  . Imbalance 01/18/2014  .  Slurring of speech   . Abnormality of gait 03/12/2012  . Paralysis agitans ([place]) 03/12/2012  . Acute cholecystitis 02/04/2012  . Hypertension 02/04/2012  . Hyperlipidemia 02/04/2012    [facility] 11/28/2014, 12:23 PM  [facility] [address] [place], [place], [address] Phone: [phone]   Fax:  [phone]  Name: Kyle Cameron MRN: [id number] Date of Birth: 02-04-1926  Vianne Bulls, OTR/L 11/28/2014 12:23 PM

## 2014-11-29 NOTE — Therapy (Signed)
[facility] [address] [place], [place], [address] Phone: [phone]   Fax:  [phone]  Physical Therapy Treatment  Patient Details  Name: Kyle Cameron MRN: [id number] Date of Birth: 1926/05/05 Referring Provider: Lenor Coffin  Encounter Date: 11/28/2014      PT End of Session - 11/29/14 1538    Visit Number 2   Number of Visits 17   Date for PT Re-Evaluation 01/15/15   Authorization Type Medicare G-code every 10th visit   PT Start Time 1020   PT Stop Time 1100   PT Time Calculation (min) 40 min   Equipment Utilized During Treatment Gait belt   Activity Tolerance Patient tolerated treatment well   Behavior During Therapy [facility] for tasks assessed/performed      Past Medical History  Diagnosis Date  . Hypertension   . High cholesterol   . Prostate atrophy   . GERD (gastroesophageal reflux disease)   . History of diverticulitis of colon   . Benign enlargement of prostate   . Hypothyroidism   . History of renal calculi   . Parkinson's disease   . Renal calculi   . CVA (cerebral infarction)   . Stroke   . Anticoagulated on Coumadin   . Headache   . Labyrinthitis   . Grover's disease   . Cataracts, bilateral   . Parkinson's disease   . Amaurosis fugax 08/16/2014    Right eye    Past Surgical History  Procedure Laterality Date  . Cholecystectomy  02/04/2012    Procedure: LAPAROSCOPIC CHOLECYSTECTOMY WITH INTRAOPERATIVE CHOLANGIOGRAM;  Surgeon: Merrie Roof, MD;  Location: [facility];  Service: General;  Laterality: N/A;  . Lithotripsy      renal calculi  . Tonsillectomy    . Cataract extraction, bilateral      There were no vitals filed for this visit.  Visit Diagnosis:  Decreased functional mobility and endurance  Festinating gait  Abnormality of gait      Subjective Assessment - 11/28/14 1027    Subjective No falls since the evaluation.  Has not yet gotten the medication  (apomorphine).   Currently in Pain? No/denies                         [facility] Adult PT Treatment/Exercise - 11/28/14 1027    Transfers   Transfers Sit to Stand;Stand to Sit   Sit to Stand 4: Min guard;From elevated surface;With upper extremity assist;Without upper extremity assist;From bed;From chair/3-in-1   Stand to Sit 4: Min guard;With upper extremity assist;Without upper extremity assist;To chair/3-in-1;To elevated surface   Number of Reps 10 reps;Other sets (comment)  2 sets-22", then 18"   Transfer Cueing Requires cueing initial forward lean; however, pt has several episodes of posterior lean with sit<>stand   Comments Round robin transfers mat<>chair on R and L with sit<>stand then weightshifting, then sidestepping to get to the next chair, multiple reps with min guard   Ambulation/Gait   Ambulation/Gait Yes   Ambulation/Gait Assistance 4: Min guard   Ambulation/Gait Assistance Details Trialed U-step rolling walker to assist with improved initiation of gait and turns with decreased freezing episode (unable to utilize laser light on U-step RW, as battery is dead)   Ambulation Distance (Feet) 400 Feet   Assistive device Other (Comment)  U-step RW   Gait Pattern Narrow base of support  Decreased foot clearance bilaterally   Ambulation Surface Level;Indoor   Pre-Gait Activities Instructed patient  in use of U-step RW, including hold/release of handles for stopping and starting, use of visual cues for increased step length.   Gait Comments Negotiated turns and furniture using U-step RW; slight initial hesitation with gait, no freezing of gait noted until pt gets ready to turn to sit.   High Level Balance   High Level Balance Comments Standing weightshifting activities at pt's 4-wheeled RW:  wide BOS lateral weightshifting>weightshifting lifts with 1-2 second hold for improved weightshifting and singel limb stance x 10 reps each; marching in place with 1-2 second hold x 10  reps each; stagger stance position with forward/back weightshifting x 10 reps with UE support of walker and min guard assistance of therapist.  Therapist provides cues for pacing and deliberateness of exercise.                PT Education - 11/29/14 1537    Education provided Yes   Education Details HEP-see instructions-marching in place, lateral weightshifting, and sit<>stand transfers   Person(s) Educated Patient   Methods Explanation;Demonstration;Verbal cues;Handout   Comprehension Verbalized understanding;Returned demonstration;Verbal cues required;Need further instruction          PT Short Term Goals - 11/16/14 1331    PT SHORT TERM GOAL #1   Title Pt will perform HEP with family/caregiver supervision for improved transfers, balance and gait.  TARGET 12/16/14   Time 4   Period Weeks   Status New   PT SHORT TERM GOAL #2   Title Pt will improve 5x sit<>stand to less than or equal to 15 seconds with no posterior lean for improved transfer efficiency and safety.   Time 4   Period Weeks   Status New   PT SHORT TERM GOAL #3   Title Pt will verbalize/demonstrate understanding of techniques to reduce freezing episodes with gait.   Time 4   Period Weeks   Status New   PT SHORT TERM GOAL #4   Title Pt will be able to initiate gait within less than 30 seconds upon standing    Time 4   Period Weeks   Status New           PT Long Term Goals - 11/16/14 1335    PT LONG TERM GOAL #1   Title Pt will verbalize/demonstrate understanding of fall prevention techniques within the home environment.  (TARGET 01/15/15)   Time 8   Period Weeks   Status New   PT LONG TERM GOAL #2   Title Pt will improve gait velocity to at least 2.3 ft/sec for improved efficiency and safety with gait.   Time 8   Period Weeks   Status New   PT LONG TERM GOAL #3   Title Pt will be able to complete TUG test in less than 2 minutes, to demonstrate improved short distance functional mobility within  the home.   Time 8   Period Weeks   Status New   PT LONG TERM GOAL #4   Title Further assessment for assistive device to be completed for additional safety within home and community.   Time 8   Period Weeks   Status New   PT LONG TERM GOAL #5   Title Pt will verbalize plans for continued community fitness upon D/C from PT.   Time 8   Period Weeks   Status New               Plan - 11/29/14 1539    Clinical Impression Statement With cues for slowed  pacing deliberate movements for weightshifting, pt is able to demonstrate improved initation of transfers.  With use of U-step rolling walker, pt is able to ambulate longer distances with no episodes of freezing except for end of gait when turning to sit.  Pt notes that at home hurryind and distractions set off freezing episodes.  Pt would benefit from continued trial use of U-step RW during therapy sessions to improve gait stamina and strength and reduce freezing episodes.   Pt will benefit from skilled therapeutic intervention in order to improve on the following deficits Abnormal gait;Decreased activity tolerance;Decreased balance;Decreased mobility;Decreased strength;Difficulty walking;Postural dysfunction   Rehab Potential Good   PT Frequency 2x / week   PT Duration 8 weeks  plus eval   PT Treatment/Interventions ADLs/Self Care Home Management;Therapeutic exercise;Therapeutic activities;Functional mobility training;Gait training;DME Instruction;Balance training;Neuromuscular re-education;Patient/family education   PT Next Visit Plan Review strategies to reduce freezing with gait; practice weigthshifting, dynamic single limb stance activities; transfers; gait with U-step RW   Consulted and Agree with Plan of Care Patient        Problem List Patient Active Problem List   Diagnosis Date Noted  . Amaurosis fugax 08/16/2014  . Cerebral infarction due to embolism of left middle cerebral artery ([place]) 06/22/2014  . Discoloration of  skin of foot 03/15/2014  . PD (Parkinson's disease) ([place]) 03/15/2014  . Cerebral infarction due to embolism of cerebral artery ([place]) 03/15/2014  . Essential hypertension 03/15/2014  . HLD (hyperlipidemia) 03/15/2014  . Cough   . LV (left ventricular) mural thrombus ([place])   . CVA (cerebral infarction) 01/18/2014  . Imbalance 01/18/2014  . Slurring of speech   . Abnormality of gait 03/12/2012  . Paralysis agitans ([place]) 03/12/2012  . Acute cholecystitis 02/04/2012  . Hypertension 02/04/2012  . Hyperlipidemia 02/04/2012    Weston Kallman W. 11/29/2014, 3:45 PM  Frazier Butt., PT  [facility] [address] [place], [place], [address] Phone: [phone]   Fax:  [phone]  Name: Kyle Cameron MRN: [id number] Date of Birth: November 30, 1926

## 2014-11-30 ENCOUNTER — Ambulatory Visit: Payer: Medicare Other | Admitting: Speech Pathology

## 2014-11-30 ENCOUNTER — Other Ambulatory Visit: Payer: Self-pay | Admitting: Neurology

## 2014-11-30 DIAGNOSIS — R258 Other abnormal involuntary movements: Secondary | ICD-10-CM | POA: Diagnosis not present

## 2014-11-30 DIAGNOSIS — R490 Dysphonia: Secondary | ICD-10-CM

## 2014-11-30 DIAGNOSIS — R4701 Aphasia: Secondary | ICD-10-CM

## 2014-11-30 NOTE — Therapy (Signed)
[facility] [address] [place], [place], [address] Phone: [phone]   Fax:  [phone]  Speech Language Pathology Treatment  Patient Details  Name: Kyle Cameron MRN: [id number] Date of Birth: 10/19/1926 Referring Provider: Jannifer Franklin, Dr. Lala Lund.  Encounter Date: 11/30/2014      End of Session - 11/30/14 1400    Visit Number 2   Number of Visits 17   Date for SLP Re-Evaluation 01/15/15   SLP Start Time 8527   SLP Stop Time  1400   SLP Time Calculation (min) 43 min      Past Medical History  Diagnosis Date  . Hypertension   . High cholesterol   . Prostate atrophy   . GERD (gastroesophageal reflux disease)   . History of diverticulitis of colon   . Benign enlargement of prostate   . Hypothyroidism   . History of renal calculi   . Parkinson's disease   . Renal calculi   . CVA (cerebral infarction)   . Stroke   . Anticoagulated on Coumadin   . Headache   . Labyrinthitis   . Grover's disease   . Cataracts, bilateral   . Parkinson's disease   . Amaurosis fugax 08/16/2014    Right eye    Past Surgical History  Procedure Laterality Date  . Cholecystectomy  02/04/2012    Procedure: LAPAROSCOPIC CHOLECYSTECTOMY WITH INTRAOPERATIVE CHOLANGIOGRAM;  Surgeon: Merrie Roof, MD;  Location: [facility];  Service: General;  Laterality: N/A;  . Lithotripsy      renal calculi  . Tonsillectomy    . Cataract extraction, bilateral      There were no vitals filed for this visit.  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      Subjective Assessment - 11/30/14 1319    Subjective "I'm speak louder"   Currently in Pain? No/denies               ADULT SLP TREATMENT - 11/30/14 1321    General Information   Behavior/Cognition Alert;Cooperative;Pleasant mood   Treatment Provided   Treatment provided Cognitive-Linquistic   Pain Assessment   Pain Assessment No/denies pain   Cognitive-Linquistic Treatment   Treatment focused on Dysarthria   Skilled Treatment Loud /a/ average 79dB with mod cues and modeling. Facilitated loud volume with oral reading of short phrases average of 70dB - pt reports he feels like he is shouting  -I explaine that he is speaking at average conversation voume. Pt encouraged to "think shout." Structured speech tasks with average of 69dB with usual mod cues for loudness. Simple conversation re: travels wiwth average 68dB with usual mod cues    Assessment / Recommendations / Plan   Plan Continue with current plan of care   Progression Toward Goals   Progression toward goals Progressing toward goals          SLP Education - 11/30/14 1355    Education provided Yes   Education Details Feel like you are shouting   Person(s) Educated Patient   Methods Explanation;Demonstration;Verbal cues;Handout   Comprehension Verbalized understanding;Returned demonstration;Verbal cues required;Need further instruction          SLP Short Term Goals - 11/30/14 1359    SLP SHORT TERM GOAL #1   Title pt will complete loud /a/ with average 82dB over 4 sessions with rare min A   Time 4   Period Weeks   Status On-going   SLP SHORT TERM GOAL #2   Title Pt will  improve loudness in 5 minutes simple conversation to average 68dB over two sessions   Time 4   Period Weeks   Status On-going          SLP Long Term Goals - 11/30/14 1400    SLP LONG TERM GOAL #1   Title pt will sustain loud /a/ across 5 reps at average 83dB over four sessions   Time 8   Period Weeks   Status On-going   SLP LONG TERM GOAL #2   Title pt will demo average 68dB in 8 minutes simple conversation over two sessions   Time 8   Period Weeks   Status On-going   SLP LONG TERM GOAL #3   Title pt will achieve conversational speech intelligibility of 95% in min noisy environment over three sessions   Time 8   Period Weeks   Status On-going          Plan - 11/30/14 1355     Clinical Impression Statement Pt required mod A to maintain average of 69dB in conversation and 70dB in structured speech tasks. He requied occasional min semantic and phonemic cues for word finding episodes. Continue skilled ST to maximize intellgibility and verabal expression.   Speech Therapy Frequency 2x / week   Treatment/Interventions SLP instruction and feedback;Compensatory strategies;Internal/external aids;Patient/family education;Functional tasks;Cueing hierarchy   Potential to Achieve Goals Fair   Potential Considerations Severity of impairments;Previous level of function        Problem List Patient Active Problem List   Diagnosis Date Noted  . Amaurosis fugax 08/16/2014  . Cerebral infarction due to embolism of left middle cerebral artery ([place]) 06/22/2014  . Discoloration of skin of foot 03/15/2014  . PD (Parkinson's disease) ([place]) 03/15/2014  . Cerebral infarction due to embolism of cerebral artery ([place]) 03/15/2014  . Essential hypertension 03/15/2014  . HLD (hyperlipidemia) 03/15/2014  . Cough   . LV (left ventricular) mural thrombus ([place])   . CVA (cerebral infarction) 01/18/2014  . Imbalance 01/18/2014  . Slurring of speech   . Abnormality of gait 03/12/2012  . Paralysis agitans ([place]) 03/12/2012  . Acute cholecystitis 02/04/2012  . Hypertension 02/04/2012  . Hyperlipidemia 02/04/2012    Lovvorn, Annye Rusk MS, CCC-SLP 11/30/2014, 2:01 PM  [facility] [address] [place], [place], [address] Phone: [phone]   Fax:  [phone]   Name: Kyle Cameron MRN: [id number] Date of Birth: 05-13-1926

## 2014-11-30 NOTE — Patient Instructions (Signed)
Continue loud /a/

## 2014-12-01 ENCOUNTER — Ambulatory Visit: Payer: Medicare Other | Admitting: Occupational Therapy

## 2014-12-01 ENCOUNTER — Ambulatory Visit: Payer: Medicare Other

## 2014-12-01 DIAGNOSIS — R258 Other abnormal involuntary movements: Secondary | ICD-10-CM

## 2014-12-01 DIAGNOSIS — R29898 Other symptoms and signs involving the musculoskeletal system: Secondary | ICD-10-CM

## 2014-12-01 DIAGNOSIS — R279 Unspecified lack of coordination: Secondary | ICD-10-CM

## 2014-12-01 DIAGNOSIS — G20 Parkinson's disease: Secondary | ICD-10-CM

## 2014-12-01 DIAGNOSIS — Z7409 Other reduced mobility: Secondary | ICD-10-CM

## 2014-12-01 DIAGNOSIS — R4701 Aphasia: Secondary | ICD-10-CM

## 2014-12-01 DIAGNOSIS — R490 Dysphonia: Secondary | ICD-10-CM

## 2014-12-01 NOTE — Patient Instructions (Signed)
Complete 5 loud "ah", twice a day

## 2014-12-01 NOTE — Therapy (Signed)
[facility] [address] [place], [place], [address] Phone: [phone]   Fax:  [phone]  Speech Language Pathology Treatment  Patient Details  Name: Kyle Cameron MRN: [id number] Date of Birth: 02-28-1926 Referring Provider: Jannifer Franklin, Dr. Lala Lund.  Encounter Date: 12/01/2014      End of Session - 12/01/14 1510    Visit Number 3   Number of Visits 17   Date for SLP Re-Evaluation 01/15/15   SLP Start Time [id number]   SLP Stop Time  1444   SLP Time Calculation (min) 41 min   Activity Tolerance Patient tolerated treatment well      Past Medical History  Diagnosis Date  . Hypertension   . High cholesterol   . Prostate atrophy   . GERD (gastroesophageal reflux disease)   . History of diverticulitis of colon   . Benign enlargement of prostate   . Hypothyroidism   . History of renal calculi   . Parkinson's disease   . Renal calculi   . CVA (cerebral infarction)   . Stroke   . Anticoagulated on Coumadin   . Headache   . Labyrinthitis   . Grover's disease   . Cataracts, bilateral   . Parkinson's disease   . Amaurosis fugax 08/16/2014    Right eye    Past Surgical History  Procedure Laterality Date  . Cholecystectomy  02/04/2012    Procedure: LAPAROSCOPIC CHOLECYSTECTOMY WITH INTRAOPERATIVE CHOLANGIOGRAM;  Surgeon: Merrie Roof, MD;  Location: [facility];  Service: General;  Laterality: N/A;  . Lithotripsy      renal calculi  . Tonsillectomy    . Cataract extraction, bilateral      There were no vitals filed for this visit.  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      Subjective Assessment - 11/30/14 1319    Subjective "I'm speak louder"   Currently in Pain? No/denies               ADULT SLP TREATMENT - 12/01/14 1421    General Information   Behavior/Cognition Alert;Cooperative;Pleasant mood   Treatment Provided   Treatment provided Cognitive-Linquistic   Pain Assessment   Pain Assessment No/denies pain   Cognitive-Linquistic Treatment   Treatment focused on Dysarthria   Skilled Treatment Loud /a/ average 84dB with consistent mod cues for loudness and extending /a/ >5 seconds. Prior to /a/, pt's average loudness 65dB. After loud /a/, no real improvement in loudness. In structured therapy tasks, pt's volume was average 67dB; improved to 68dB with mod cues occasionally for loudness. Pt with occasional word finding deficits today, greater frequency than evaluation. Semantic cues benefitted pt mostly.   Assessment / Recommendations / Plan   Plan Continue with current plan of care   Progression Toward Goals   Progression toward goals Progressing toward goals          SLP Education - 11/30/14 1355    Education provided Yes   Education Details Feel like you are shouting   Person(s) Educated Patient   Methods Explanation;Demonstration;Verbal cues;Handout   Comprehension Verbalized understanding;Returned demonstration;Verbal cues required;Need further instruction          SLP Short Term Goals - 11/30/14 1359    SLP SHORT TERM GOAL #1   Title pt will complete loud /a/ with average 82dB over 4 sessions with rare min A   Time 4   Period Weeks   Status On-going   SLP SHORT TERM GOAL #2   Title Pt  will improve loudness in 5 minutes simple conversation to average 68dB over two sessions   Time 4   Period Weeks   Status On-going          SLP Long Term Goals - 11/30/14 1400    SLP LONG TERM GOAL #1   Title pt will sustain loud /a/ across 5 reps at average 83dB over four sessions   Time 8   Period Weeks   Status On-going   SLP LONG TERM GOAL #2   Title pt will demo average 68dB in 8 minutes simple conversation over two sessions   Time 8   Period Weeks   Status On-going   SLP LONG TERM GOAL #3   Title pt will achieve conversational speech intelligibility of 95% in min noisy environment over three sessions   Time 8   Period Weeks    Status On-going          Plan - 12/01/14 1510    Clinical Impression Statement Pt required mod A to maintain average of 69dB in conversation and 70dB in structured speech tasks. He requied occasional min semantic and phonemic cues for word finding episodes. Continue skilled ST to maximize intellgibility and verabal expression.   Speech Therapy Frequency 2x / week   Duration --  8 weeks   Treatment/Interventions SLP instruction and feedback;Compensatory strategies;Internal/external aids;Patient/family education;Functional tasks;Cueing hierarchy   Potential to Achieve Goals Fair   Potential Considerations Severity of impairments;Previous level of function        Problem List Patient Active Problem List   Diagnosis Date Noted  . Amaurosis fugax 08/16/2014  . Cerebral infarction due to embolism of left middle cerebral artery ([place]) 06/22/2014  . Discoloration of skin of foot 03/15/2014  . PD (Parkinson's disease) ([place]) 03/15/2014  . Cerebral infarction due to embolism of cerebral artery ([place]) 03/15/2014  . Essential hypertension 03/15/2014  . HLD (hyperlipidemia) 03/15/2014  . Cough   . LV (left ventricular) mural thrombus ([place])   . CVA (cerebral infarction) 01/18/2014  . Imbalance 01/18/2014  . Slurring of speech   . Abnormality of gait 03/12/2012  . Paralysis agitans ([place]) 03/12/2012  . Acute cholecystitis 02/04/2012  . Hypertension 02/04/2012  . Hyperlipidemia 02/04/2012    SCHINKE,CARL , [place], CCC-SLP   12/01/2014, 3:13 PM  [facility] [address] [place], [place], [address] Phone: [phone]   Fax:  [phone]   Name: Kyle Cameron MRN: [id number] Date of Birth: 1926/11/12

## 2014-12-01 NOTE — Therapy (Signed)
[facility] [address] [place], [place], [address] Phone: [phone]   Fax:  [phone]  Occupational Therapy Treatment  Patient Details  Name: Kyle Cameron MRN: [id number] Date of Birth: Sep 19, 1926 Referring Provider: Dr. Jannifer Franklin  Encounter Date: 12/01/2014      OT End of Session - 12/01/14 1355    Visit Number 3   Date for OT Re-Evaluation 01/21/15   Authorization Type 1Medicare, 2AARP, G-code needed   Authorization Time Period kx   Authorization - Visit Number 3   OT Start Time [id number]   OT Stop Time 1400   OT Time Calculation (min) 43 min   Activity Tolerance Patient tolerated treatment well   Behavior During Therapy [facility] for tasks assessed/performed      Past Medical History  Diagnosis Date  . Hypertension   . High cholesterol   . Prostate atrophy   . GERD (gastroesophageal reflux disease)   . History of diverticulitis of colon   . Benign enlargement of prostate   . Hypothyroidism   . History of renal calculi   . Parkinson's disease   . Renal calculi   . CVA (cerebral infarction)   . Stroke   . Anticoagulated on Coumadin   . Headache   . Labyrinthitis   . Grover's disease   . Cataracts, bilateral   . Parkinson's disease   . Amaurosis fugax 08/16/2014    Right eye    Past Surgical History  Procedure Laterality Date  . Cholecystectomy  02/04/2012    Procedure: LAPAROSCOPIC CHOLECYSTECTOMY WITH INTRAOPERATIVE CHOLANGIOGRAM;  Surgeon: Merrie Roof, MD;  Location: [facility];  Service: General;  Laterality: N/A;  . Lithotripsy      renal calculi  . Tonsillectomy    . Cataract extraction, bilateral      There were no vitals filed for this visit.  Visit Diagnosis:  Rigidity  Bradykinesia  Decreased functional mobility and endurance  Lack of coordination     Treatment: Arm bike x 5 mins level 1 for conditioning. Pt attempted to walk from waiting room to gym yet was unable due to severe  freezing / fatigue. Seated functional bilateral overhead reaching with trunk rotation, min v.c. for posture and big movements Supine reviewed PWR! Supine, Basic 4 x 10 reps, min -mod v.c. initially then pt was able to return demonstration  Seated 2 sets of 10 reps each for shoulder abduction and rowing with yellow theraband, min v.c. following demonstration. Pt amb from gym to [place] office with rollator and multiple freezing episodes, mod v.c.                        OT Short Term Goals - 11/23/14 1505    OT SHORT TERM GOAL #1   Title Pt will be independent with updated HEP.--due 12/31/14   Time 4   Period Weeks   Status New   OT SHORT TERM GOAL #2   Title Pt will improve coordination as shown by completing 9-hole peg test in 90 secs or less with dominant R hand.-   Baseline 102 secs   Time 4   Period Weeks   Status New   OT SHORT TERM GOAL #3   Title Pt will be able to write at least 3 sentences with at least 50% legibility.   Time 4   Period Weeks   Status New   OT SHORT TERM GOAL #4   Title Pt will demonstrate ability to  perfom RUE elbow extension at -10 for improved functional reach   Baseline baseline -18   Time 4   Period Weeks   Status New   OT SHORT TERM GOAL #5   Title ------------------------------------------------------------------------------------------------------------------------------------------           OT Long Term Goals - 11/23/14 1507    OT LONG TERM GOAL #1   Title Pt will verbalize understanding of adaptive strategies for increased safety and independence with  ADLs/IADLs prn    Status New   OT LONG TERM GOAL #2   Title Pt will improve coordination as shown by completing 9-hole peg test in 60 secs or less with right dominant hand   Status New   OT LONG TERM GOAL #3   Title Pt will demonstrate improved functional reaching/ coordination for ADLs as evidenced by improving box/ blocks score to 26 blocks for RUE.   Status New   OT  LONG TERM GOAL #4   Title Pt will demonstrate ability to retrieve lightweight items at 110 shoulder flexion with RUE.   Baseline baseline 105 for RUE   OT LONG TERM GOAL #5   Title Pt will demonstrate ability to perfrom PPT# 4(donning jacket in 25 secs or less without loss of balance)   Time 8   Period Weeks   Status New   OT LONG TERM GOAL #6   Title Pt will perform simple cooking/ home management in standing with supervision without LOB.   Time 8   Period Weeks   Status New               Plan - 12/01/14 1348    Clinical Impression Statement Pt is progressing slowly limited by impulsiviity and freezing with ambulation.    Pt will benefit from skilled therapeutic intervention in order to improve on the following deficits (Retired) Decreased balance;Decreased knowledge of use of DME;Impaired UE functional use;Impaired perceived functional ability;Decreased strength;Decreased mobility;Decreased activity tolerance;Impaired tone;Impaired sensation;Decreased safety awareness;Decreased endurance;Decreased coordination   Rehab Potential Good   Clinical Impairments Affecting Rehab Potential speed and timing.   OT Frequency 2x / week   OT Duration 8 weeks   Plan coordiantion activities, functional reaching   [place] Education issued:  PWR! supine (basic 4), PWR! hands, card coordination HEP   Consulted and Agree with Plan of Care Patient        Problem List Patient Active Problem List   Diagnosis Date Noted  . Amaurosis fugax 08/16/2014  . Cerebral infarction due to embolism of left middle cerebral artery ([place]) 06/22/2014  . Discoloration of skin of foot 03/15/2014  . PD (Parkinson's disease) ([place]) 03/15/2014  . Cerebral infarction due to embolism of cerebral artery ([place]) 03/15/2014  . Essential hypertension 03/15/2014  . HLD (hyperlipidemia) 03/15/2014  . Cough   . LV (left ventricular) mural thrombus ([place])   . CVA (cerebral infarction) 01/18/2014  . Imbalance  01/18/2014  . Slurring of speech   . Abnormality of gait 03/12/2012  . Paralysis agitans ([place]) 03/12/2012  . Acute cholecystitis 02/04/2012  . Hypertension 02/04/2012  . Hyperlipidemia 02/04/2012    Britanie Harshman 12/01/2014, 1:56 PM Theone Murdoch, OTR/L [phone] Phone: [phone] 1:56 PM 12/01/2014 [facility] [address] [place], [place], [address] Phone: [phone]   Fax:  [phone]  Name: Darcell Gerbino MRN: [id number] Date of Birth: November 23, 1926

## 2014-12-02 ENCOUNTER — Other Ambulatory Visit: Payer: Self-pay

## 2014-12-02 MED ORDER — APOMORPHINE HCL 10 MG/ML ~~LOC~~ SOLN: 0.2000 mL | SUBCUTANEOUS | Status: DC | PRN

## 2014-12-07 ENCOUNTER — Ambulatory Visit: Payer: Medicare Other | Admitting: Occupational Therapy

## 2014-12-07 ENCOUNTER — Ambulatory Visit: Payer: Medicare Other

## 2014-12-07 ENCOUNTER — Ambulatory Visit: Payer: Medicare Other | Admitting: Physical Therapy

## 2014-12-07 DIAGNOSIS — R29898 Other symptoms and signs involving the musculoskeletal system: Secondary | ICD-10-CM

## 2014-12-07 DIAGNOSIS — G20 Parkinson's disease: Secondary | ICD-10-CM

## 2014-12-07 DIAGNOSIS — Z7409 Other reduced mobility: Secondary | ICD-10-CM

## 2014-12-07 DIAGNOSIS — R258 Other abnormal involuntary movements: Secondary | ICD-10-CM | POA: Diagnosis not present

## 2014-12-07 DIAGNOSIS — R6889 Other general symptoms and signs: Secondary | ICD-10-CM

## 2014-12-07 DIAGNOSIS — R490 Dysphonia: Secondary | ICD-10-CM

## 2014-12-07 DIAGNOSIS — R4701 Aphasia: Secondary | ICD-10-CM

## 2014-12-07 DIAGNOSIS — R279 Unspecified lack of coordination: Secondary | ICD-10-CM

## 2014-12-07 DIAGNOSIS — R269 Unspecified abnormalities of gait and mobility: Secondary | ICD-10-CM

## 2014-12-07 NOTE — Therapy (Signed)
[facility] [address] [place], [place], [address] Phone: [phone]   Fax:  [phone]  Occupational Therapy Treatment  Patient Details  Name: Kyle Cameron MRN: [id number] Date of Birth: August 12, 1926 Referring Provider: Dr. Jannifer Franklin  Encounter Date: 12/07/2014      OT End of Session - 12/07/14 1619    Visit Number 4   Date for OT Re-Evaluation 01/21/15   Authorization Type 1Medicare, 2AARP, G-code needed   Authorization Time Period kx   Authorization - Visit Number 4   Authorization - Number of Visits 10   OT Start Time 1017   OT Stop Time 1100   OT Time Calculation (min) 43 min   Activity Tolerance Patient tolerated treatment well   Behavior During Therapy Impulsive      Past Medical History  Diagnosis Date  . Hypertension   . High cholesterol   . Prostate atrophy   . GERD (gastroesophageal reflux disease)   . History of diverticulitis of colon   . Benign enlargement of prostate   . Hypothyroidism   . History of renal calculi   . Parkinson's disease   . Renal calculi   . CVA (cerebral infarction)   . Stroke   . Anticoagulated on Coumadin   . Headache   . Labyrinthitis   . Grover's disease   . Cataracts, bilateral   . Parkinson's disease   . Amaurosis fugax 08/16/2014    Right eye    Past Surgical History  Procedure Laterality Date  . Cholecystectomy  02/04/2012    Procedure: LAPAROSCOPIC CHOLECYSTECTOMY WITH INTRAOPERATIVE CHOLANGIOGRAM;  Surgeon: Merrie Roof, MD;  Location: [facility];  Service: General;  Laterality: N/A;  . Lithotripsy      renal calculi  . Tonsillectomy    . Cataract extraction, bilateral      There were no vitals filed for this visit.  Visit Diagnosis:  Lack of coordination  Decreased functional mobility and endurance  Hypokinesia  Rigidity      Subjective Assessment - 12/07/14 1314    Subjective  "I wish I could walk more, I just can't"   Pertinent  History Pt with PD (diagnosed 3 years ago) with 2 CVAs  Pt reports PD worse after CVA.  Pt reports increased difficulty with speech, R hand, and walking.   Patient Stated Goals improve balance/ walking, use of RUE   Currently in Pain? No/denies                      OT Treatments/Exercises (OP) - 12/07/14 0001    ADLs   Overall ADLs Use of cueing/demo for exaggerated slow movements to improve timing and reduce freezing while using big amplitude movements.     Functional Mobility Reinforced strategies to decr freezing with min-mod cues/A.  Practiced functional sit>stand by breaking down movment and then progressing to full movement, practiced ambulating with 4 wheeled RW using slow, big amplitude movements and practiced transfer with stepping with slow, big movements during session.  Pt needed min-mod cues, but decr cueing with repetition.  Gave positive reinforcement with success.           PWR [name]) - 12/07/14 1621    PWR! exercises Moves in sitting   PWR! Up x10   PWR! Rock x10 to each side   PWR! Twist x10 to each side   PWR! Step x10 to each side   Comments with min-mod cues (verbal and visual) for timing and exaggerated  slow movements and big amplitude movements             OT Education - 12/07/14 1617    Education Details Exaggerate slowness of movement as rushing/impusivity contributes to freezing, difficulty with functional movments, and timing difficulty   Person(s) Educated Patient   Methods Explanation;Demonstration   Comprehension Verbalized understanding;Returned demonstration;Verbal cues required          OT Short Term Goals - 11/23/14 1505    OT SHORT TERM GOAL #1   Title Pt will be independent with updated HEP.--due 12/31/14   Time 4   Period Weeks   Status New   OT SHORT TERM GOAL #2   Title Pt will improve coordination as shown by completing 9-hole peg test in 90 secs or less with dominant R hand.-   Baseline 102 secs   Time 4    Period Weeks   Status New   OT SHORT TERM GOAL #3   Title Pt will be able to write at least 3 sentences with at least 50% legibility.   Time 4   Period Weeks   Status New   OT SHORT TERM GOAL #4   Title Pt will demonstrate ability to perfom RUE elbow extension at -10 for improved functional reach   Baseline baseline -18   Time 4   Period Weeks   Status New   OT SHORT TERM GOAL #5   Title ------------------------------------------------------------------------------------------------------------------------------------------           OT Long Term Goals - 11/23/14 1507    OT LONG TERM GOAL #1   Title Pt will verbalize understanding of adaptive strategies for increased safety and independence with  ADLs/IADLs prn    Status New   OT LONG TERM GOAL #2   Title Pt will improve coordination as shown by completing 9-hole peg test in 60 secs or less with right dominant hand   Status New   OT LONG TERM GOAL #3   Title Pt will demonstrate improved functional reaching/ coordination for ADLs as evidenced by improving box/ blocks score to 26 blocks for RUE.   Status New   OT LONG TERM GOAL #4   Title Pt will demonstrate ability to retrieve lightweight items at 110 shoulder flexion with RUE.   Baseline baseline 105 for RUE   OT LONG TERM GOAL #5   Title Pt will demonstrate ability to perfrom PPT# 4(donning jacket in 25 secs or less without loss of balance)   Time 8   Period Weeks   Status New   OT LONG TERM GOAL #6   Title Pt will perform simple cooking/ home management in standing with supervision without LOB.   Time 8   Period Weeks   Status New               Plan - 12/07/14 1619    Clinical Impression Statement Pt demo improved safety and movement amplitude/quality with focus on exaggerated slow movements to reduce freezing/impulsivity and timing difficulties.   Plan ADLs/functional movements with exaggerated slowness to incr safety/ease with ADLs   OT Home Exercise Plan  Education issued:  PWR! supine (basic 4), PWR! hands, card coordination HEP   Consulted and Agree with Plan of Care Patient        Problem List Patient Active Problem List   Diagnosis Date Noted  . Amaurosis fugax 08/16/2014  . Cerebral infarction due to embolism of left middle cerebral artery ([place]) 06/22/2014  . Discoloration of skin of foot 03/15/2014  .  PD (Parkinson's disease) ([place]) 03/15/2014  . Cerebral infarction due to embolism of cerebral artery ([place]) 03/15/2014  . Essential hypertension 03/15/2014  . HLD (hyperlipidemia) 03/15/2014  . Cough   . LV (left ventricular) mural thrombus ([place])   . CVA (cerebral infarction) 01/18/2014  . Imbalance 01/18/2014  . Slurring of speech   . Abnormality of gait 03/12/2012  . Paralysis agitans ([place]) 03/12/2012  . Acute cholecystitis 02/04/2012  . Hypertension 02/04/2012  . Hyperlipidemia 02/04/2012    [facility] 12/07/2014, 4:26 PM  [facility] [address] [place], [place], [address] Phone: [phone]   Fax:  [phone]  Name: Frank Neer MRN: [id number] Date of Birth: 12-08-1926  Vianne Bulls, OTR/L 12/07/2014 4:26 PM

## 2014-12-07 NOTE — Therapy (Signed)
[facility] [address] [place], [place], [address] Phone: [phone]   Fax:  [phone]  Physical Therapy Treatment  Patient Details  Name: Kyle Cameron MRN: [id number] Date of Birth: May 20, 1926 Referring Provider: Lenor Coffin  Encounter Date: 12/07/2014      PT End of Session - 12/07/14 2215    Visit Number 3   Number of Visits 17   Date for PT Re-Evaluation 01/15/15   Authorization Type Medicare G-code every 10th visit   PT Start Time 1105   PT Stop Time 1146   PT Time Calculation (min) 41 min   Equipment Utilized During Treatment Gait belt   Activity Tolerance Patient tolerated treatment well   Behavior During Therapy Impulsive      Past Medical History  Diagnosis Date  . Hypertension   . High cholesterol   . Prostate atrophy   . GERD (gastroesophageal reflux disease)   . History of diverticulitis of colon   . Benign enlargement of prostate   . Hypothyroidism   . History of renal calculi   . Parkinson's disease   . Renal calculi   . CVA (cerebral infarction)   . Stroke   . Anticoagulated on Coumadin   . Headache   . Labyrinthitis   . Grover's disease   . Cataracts, bilateral   . Parkinson's disease   . Amaurosis fugax 08/16/2014    Right eye    Past Surgical History  Procedure Laterality Date  . Cholecystectomy  02/04/2012    Procedure: LAPAROSCOPIC CHOLECYSTECTOMY WITH INTRAOPERATIVE CHOLANGIOGRAM;  Surgeon: Merrie Roof, MD;  Location: [facility];  Service: General;  Laterality: N/A;  . Lithotripsy      renal calculi  . Tonsillectomy    . Cataract extraction, bilateral      There were no vitals filed for this visit.  Visit Diagnosis:  Abnormality of gait      Subjective Assessment - 12/07/14 1109    Subjective Had a fall in the shower but just sat down.  Still awaiting medication.   Pertinent History CVA 12/26/13 and 01/18/14, Parkinson's disease   Patient Stated Goals  Pt's goal for therapy is to have therapy in conjunction with Apomorphine to walk better.   Currently in Pain? No/denies                         [facility] Adult PT Treatment/Exercise - 12/07/14 0001    Transfers   Transfers Sit to Stand;Stand to Sit   Sit to Stand 4: Min guard   Stand to Sit 4: Min guard   Number of Reps 10 reps;2 sets   Transfer Cueing cues for forward weight shifting and decreased speed to prevent posterior LOB   Ambulation/Gait   Ambulation/Gait Yes   Ambulation/Gait Assistance 4: Min guard   Ambulation/Gait Assistance Details Trialed Ustep again   Ambulation Distance (Feet) 500 Feet  plus x 3   Assistive device Rollator;Other (Comment)  Ustep without laser or metronome   Gait Pattern Narrow base of support;Poor foot clearance - left;Poor foot clearance - right   Ambulation Surface Level;Indoor   Pre-Gait Activities Practiced quarter turns to back of chairs in both directions working on increasing step and weight shifting.  Pt with several episodes of freezing which was corrected with verbal cues to stop and recheck posture before continuing.   Gait Comments No freezing noted during gait with Rollator or Ustep with cues to keep speed  decreased.  Did have freezing with turns to mat with Rollator.   High Level Balance   High Level Balance Comments Standing in parallel bars for taps to 4" then 6" step with initial bil UE assist progressing to 1 UE then intermittent UE assist                PT Education - 12/07/14 2214    Education provided Yes   Education Details Decreasing speed of movement, ways to reduce freezing   Person(s) Educated Patient   Methods Explanation;Demonstration   Comprehension Verbalized understanding;Need further instruction          PT Short Term Goals - 11/16/14 1331    PT SHORT TERM GOAL #1   Title Pt will perform HEP with family/caregiver supervision for improved transfers, balance and gait.  TARGET 12/16/14   Time  4   Period Weeks   Status New   PT SHORT TERM GOAL #2   Title Pt will improve 5x sit<>stand to less than or equal to 15 seconds with no posterior lean for improved transfer efficiency and safety.   Time 4   Period Weeks   Status New   PT SHORT TERM GOAL #3   Title Pt will verbalize/demonstrate understanding of techniques to reduce freezing episodes with gait.   Time 4   Period Weeks   Status New   PT SHORT TERM GOAL #4   Title Pt will be able to initiate gait within less than 30 seconds upon standing    Time 4   Period Weeks   Status New           PT Long Term Goals - 11/16/14 1335    PT LONG TERM GOAL #1   Title Pt will verbalize/demonstrate understanding of fall prevention techniques within the home environment.  (TARGET 01/15/15)   Time 8   Period Weeks   Status New   PT LONG TERM GOAL #2   Title Pt will improve gait velocity to at least 2.3 ft/sec for improved efficiency and safety with gait.   Time 8   Period Weeks   Status New   PT LONG TERM GOAL #3   Title Pt will be able to complete TUG test in less than 2 minutes, to demonstrate improved short distance functional mobility within the home.   Time 8   Period Weeks   Status New   PT LONG TERM GOAL #4   Title Further assessment for assistive device to be completed for additional safety within home and community.   Time 8   Period Weeks   Status New   PT LONG TERM GOAL #5   Title Pt will verbalize plans for continued community fitness upon D/C from PT.   Time 8   Period Weeks   Status New               Plan - 12/07/14 2215    Clinical Impression Statement Pt continues to need cues for decreased speed of movements and weightshifting as well as to stop fighting freeze, recheck posture and weight shift before continuing. Decreased freezing with use of Rollator and Ustep vs cane.  Continue PT per POC.   Pt will benefit from skilled therapeutic intervention in order to improve on the following deficits  Abnormal gait;Decreased activity tolerance;Decreased balance;Decreased mobility;Decreased strength;Difficulty walking;Postural dysfunction   Rehab Potential Good   PT Frequency 2x / week   PT Duration 8 weeks  plus eval   PT Treatment/Interventions ADLs/Self  Care Home Management;Therapeutic exercise;Therapeutic activities;Functional mobility training;Gait training;DME Instruction;Balance training;Neuromuscular re-education;Patient/family education   PT Next Visit Plan practice weigthshifting, dynamic single limb stance activities; transfers; gait with U-step RW   Consulted and Agree with Plan of Care Patient        Problem List Patient Active Problem List   Diagnosis Date Noted  . Amaurosis fugax 08/16/2014  . Cerebral infarction due to embolism of left middle cerebral artery ([place]) 06/22/2014  . Discoloration of skin of foot 03/15/2014  . PD (Parkinson's disease) ([place]) 03/15/2014  . Cerebral infarction due to embolism of cerebral artery ([place]) 03/15/2014  . Essential hypertension 03/15/2014  . HLD (hyperlipidemia) 03/15/2014  . Cough   . LV (left ventricular) mural thrombus ([place])   . CVA (cerebral infarction) 01/18/2014  . Imbalance 01/18/2014  . Slurring of speech   . Abnormality of gait 03/12/2012  . Paralysis agitans ([place]) 03/12/2012  . Acute cholecystitis 02/04/2012  . Hypertension 02/04/2012  . Hyperlipidemia 02/04/2012    [name] 12/07/2014, 10:19 PM  [facility] [address] [place], [place], [address] Phone: [phone]   Fax:  [phone]  Name: Kyle Cameron MRN: [id number] Date of Birth: 1926/09/29    [name], [place] 12/07/2014 10:19 PM Phone: [phone] Fax: [phone]

## 2014-12-07 NOTE — Patient Instructions (Signed)
Tips to reduce freezing episodes with standing or walking:  1. Stand tall with your feet wide, so that you can rock and weight shift through your hips. 2. Don't try to fight the freeze: if you begin taking slower, faster, smaller steps, STOP, get your posture tall, and RESET your posture and balance.  Take a deep breath before taking the BIG step to start again. 3. March in place, with high knee stepping, to get started walking again. 4. Use auditory cues:  Count out loud, think of a familiar tune or song or cadence, use pocket metronome, to use rhythm to get started walking again. 5. Use visual cues:  Use a line to step over, use laser pointer line to step over, (using BIG steps) to start walking again. 6. Use visual targets to keep your posture tall (look ahead and focus on an object or target at eye level). 7. As you approach where your destination with walking, count your steps out loud and/or focus on your target with your eyes until you are fully there. 8. Use appropriate assistive device, as advised by your physical therapist to assist with taking longer, consistent steps. 9.     

## 2014-12-07 NOTE — Therapy (Signed)
[facility] [address] [place], [place], [address] Phone: [phone]   Fax:  [phone]  Speech Language Pathology Treatment  Patient Details  Name: Kyle Cameron MRN: [id number] Date of Birth: 25-Sep-1926 Referring Provider: Jannifer Franklin, Dr. Lala Lund.  Encounter Date: 12/07/2014      End of Session - 12/07/14 1234    Visit Number 4   Number of Visits 17   Date for SLP Re-Evaluation 01/15/15   SLP Start Time [id number]   SLP Stop Time  1230   SLP Time Calculation (min) 35 min   Activity Tolerance Patient tolerated treatment well      Past Medical History  Diagnosis Date  . Hypertension   . High cholesterol   . Prostate atrophy   . GERD (gastroesophageal reflux disease)   . History of diverticulitis of colon   . Benign enlargement of prostate   . Hypothyroidism   . History of renal calculi   . Parkinson's disease   . Renal calculi   . CVA (cerebral infarction)   . Stroke   . Anticoagulated on Coumadin   . Headache   . Labyrinthitis   . Grover's disease   . Cataracts, bilateral   . Parkinson's disease   . Amaurosis fugax 08/16/2014    Right eye    Past Surgical History  Procedure Laterality Date  . Cholecystectomy  02/04/2012    Procedure: LAPAROSCOPIC CHOLECYSTECTOMY WITH INTRAOPERATIVE CHOLANGIOGRAM;  Surgeon: Merrie Roof, MD;  Location: [facility];  Service: General;  Laterality: N/A;  . Lithotripsy      renal calculi  . Tonsillectomy    . Cataract extraction, bilateral      There were no vitals filed for this visit.  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      Subjective Assessment - 12/07/14 1235    Subjective Pt 10 minutes late - in restroom.               ADULT SLP TREATMENT - 12/07/14 1156    General Information   Behavior/Cognition Alert;Cooperative;Pleasant mood   Treatment Provided   Treatment provided Cognitive-Linquistic   Pain Assessment   Pain Assessment No/denies pain   Cognitive-Linquistic Treatment   Treatment focused on Dysarthria   Skilled Treatment Pt req'd SLP mod A consistently for loudness and full breath with loud /a/ with average 89dB. SLP asked pt what aspect of his communication he would like to focus on - loudness or motor speech or both and pt responded both. In order to best do that, SLP reiterated to pt the need to reduce rate in order to provide more time for the brain to appropirately send motor signals to oral muscles and also to produce less "rushing" of speech. In simple conversation, SLP req'd to use mod cues usually for pt to maintain intelligibilty of 90%. Pt's loudness was averaged at 68dB today. in quiet environment.   Assessment / Recommendations / Plan   Plan Continue with current plan of care   Progression Toward Goals   Progression toward goals Progressing toward goals          SLP Education - 12/07/14 1234    Education provided Yes   Education Details compensations for rushing of speech as well as apraxia is RATE REDUCTION   Person(s) Educated Patient   Methods Explanation;Demonstration   Comprehension Verbalized understanding;Returned demonstration;Verbal cues required;Need further instruction          SLP Short Term Goals - 12/07/14 1236  SLP SHORT TERM GOAL #1   Title pt will complete loud /a/ with average 82dB over 4 sessions with rare min A   Time 3   Period Weeks   Status On-going   SLP SHORT TERM GOAL #2   Title Pt will improve loudness in 5 minutes simple conversation to average 68dB over two sessions   Time 3   Period Weeks   Status On-going          SLP Long Term Goals - 12/07/14 1236    SLP LONG TERM GOAL #1   Title pt will sustain loud /a/ across 5 reps at average 83dB over four sessions   Time 7   Period Weeks   Status On-going   SLP LONG TERM GOAL #2   Title pt will demo average 68dB in 8 minutes simple conversation over two sessions   Time 7   Period  Weeks   Status On-going   SLP LONG TERM GOAL #3   Title pt will achieve conversational speech intelligibility of 95% in min noisy environment over three sessions   Time 7   Period Weeks   Status On-going          Plan - 12/07/14 1235    Clinical Impression Statement Pt required mod A usually to reduce rate in order to incr speech intelligibility to 90% with this SLP. Continue skilled ST to maximize intellgibility and verabal expression.   Speech Therapy Frequency 2x / week   Duration --  8 weeks   Treatment/Interventions SLP instruction and feedback;Compensatory strategies;Internal/external aids;Patient/family education;Functional tasks;Cueing hierarchy   Potential Considerations Severity of impairments;Previous level of function        Problem List Patient Active Problem List   Diagnosis Date Noted  . Amaurosis fugax 08/16/2014  . Cerebral infarction due to embolism of left middle cerebral artery ([place]) 06/22/2014  . Discoloration of skin of foot 03/15/2014  . PD (Parkinson's disease) ([place]) 03/15/2014  . Cerebral infarction due to embolism of cerebral artery ([place]) 03/15/2014  . Essential hypertension 03/15/2014  . HLD (hyperlipidemia) 03/15/2014  . Cough   . LV (left ventricular) mural thrombus ([place])   . CVA (cerebral infarction) 01/18/2014  . Imbalance 01/18/2014  . Slurring of speech   . Abnormality of gait 03/12/2012  . Paralysis agitans ([place]) 03/12/2012  . Acute cholecystitis 02/04/2012  . Hypertension 02/04/2012  . Hyperlipidemia 02/04/2012    [facility] , [place], [place]  12/07/2014, 12:38 PM  [facility] [address] [place], [place], [address] Phone: [phone]   Fax:  [phone]   Name: Kyle Cameron MRN: [id number] Date of Birth: 06-25-1926

## 2014-12-09 ENCOUNTER — Ambulatory Visit: Payer: Medicare Other | Admitting: Occupational Therapy

## 2014-12-09 ENCOUNTER — Ambulatory Visit: Payer: Medicare Other | Admitting: Physical Therapy

## 2014-12-09 ENCOUNTER — Ambulatory Visit: Payer: Medicare Other

## 2014-12-09 ENCOUNTER — Telehealth: Payer: Self-pay | Admitting: Neurology

## 2014-12-09 DIAGNOSIS — Z7409 Other reduced mobility: Secondary | ICD-10-CM

## 2014-12-09 DIAGNOSIS — G20 Parkinson's disease: Secondary | ICD-10-CM

## 2014-12-09 DIAGNOSIS — R490 Dysphonia: Secondary | ICD-10-CM

## 2014-12-09 DIAGNOSIS — R269 Unspecified abnormalities of gait and mobility: Secondary | ICD-10-CM

## 2014-12-09 DIAGNOSIS — R4701 Aphasia: Secondary | ICD-10-CM

## 2014-12-09 DIAGNOSIS — R6889 Other general symptoms and signs: Secondary | ICD-10-CM

## 2014-12-09 DIAGNOSIS — R2689 Other abnormalities of gait and mobility: Secondary | ICD-10-CM

## 2014-12-09 DIAGNOSIS — R258 Other abnormal involuntary movements: Secondary | ICD-10-CM | POA: Diagnosis not present

## 2014-12-09 DIAGNOSIS — R29898 Other symptoms and signs involving the musculoskeletal system: Secondary | ICD-10-CM

## 2014-12-09 DIAGNOSIS — R279 Unspecified lack of coordination: Secondary | ICD-10-CM

## 2014-12-09 NOTE — Therapy (Signed)
[facility] [address] [place], [place], [address] Phone: [phone]   Fax:  [phone]  Speech Language Pathology Treatment  Patient Details  Name: Kyle Cameron MRN: [id number] Date of Birth: 1926-04-10 Referring Provider: Jannifer Franklin, Dr. Lala Lund.  Encounter Date: 12/09/2014      End of Session - 12/09/14 1629    Visit Number 5   Number of Visits 17   Date for SLP Re-Evaluation 01/15/15   SLP Start Time [id number]   SLP Stop Time  1226   SLP Time Calculation (min) 41 min   Activity Tolerance Patient tolerated treatment well      Past Medical History  Diagnosis Date  . Hypertension   . High cholesterol   . Prostate atrophy   . GERD (gastroesophageal reflux disease)   . History of diverticulitis of colon   . Benign enlargement of prostate   . Hypothyroidism   . History of renal calculi   . Parkinson's disease   . Renal calculi   . CVA (cerebral infarction)   . Stroke   . Anticoagulated on Coumadin   . Headache   . Labyrinthitis   . Grover's disease   . Cataracts, bilateral   . Parkinson's disease   . Amaurosis fugax 08/16/2014    Right eye    Past Surgical History  Procedure Laterality Date  . Cholecystectomy  02/04/2012    Procedure: LAPAROSCOPIC CHOLECYSTECTOMY WITH INTRAOPERATIVE CHOLANGIOGRAM;  Surgeon: Merrie Roof, MD;  Location: [facility];  Service: General;  Laterality: N/A;  . Lithotripsy      renal calculi  . Tonsillectomy    . Cataract extraction, bilateral      There were no vitals filed for this visit.  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             ADULT SLP TREATMENT - 12/09/14 1147    General Information   Behavior/Cognition Alert;Cooperative;Pleasant mood   Treatment Provided   Treatment provided Cognitive-Linquistic   Pain Assessment   Pain Assessment --  "My feet are an annoyance - not painful."   Cognitive-Linquistic Treatment    Treatment focused on Dysarthria   Skilled Treatment Loud /a/ focused on in therapy to objectify pt's feeling of "louder" speech, with average 85dB. Pt maintained volume over 15 minute conversation of 69dB with rare min A for loudness. SLP encouraged pt to develop a non-verbal sign, or a one to two-word cue for pt to know he needs to incr his volume in social situations. Pt told SLP he and friend already have this system.    Assessment / Recommendations / Plan   Plan Continue with current plan of care   Progression Toward Goals   Progression toward goals Progressing toward goals            SLP Short Term Goals - 12/09/14 1632    SLP SHORT TERM GOAL #1   Title pt will complete loud /a/ with average 82dB over 4 sessions with rare min A   Time 3   Period Weeks   Status On-going   SLP SHORT TERM GOAL #2   Title Pt will improve loudness in 5 minutes simple conversation to average 68dB over two sessions   Time 3   Period Weeks   Status On-going          SLP Long Term Goals - 12/09/14 1634    SLP LONG TERM GOAL #1   Title pt will sustain loud /a/  across 5 reps at average 83dB over four sessions   Time 7   Period Weeks   Status On-going   SLP LONG TERM GOAL #2   Title pt will demo average 68dB in 8 minutes simple conversation over two sessions   Time 7   Period Weeks   Status On-going   SLP LONG TERM GOAL #3   Title pt will achieve conversational speech intelligibility of 95% in min noisy environment over three sessions   Time 7   Period Weeks   Status On-going          Plan - 12/09/14 1630    Speech Therapy Frequency 2x / week   Duration --  7 weeks   Treatment/Interventions SLP instruction and feedback;Compensatory strategies;Internal/external aids;Patient/family education;Functional tasks;Cueing hierarchy   Potential to Achieve Goals Fair   Potential Considerations Severity of impairments;Previous level of function        Problem List Patient Active Problem  List   Diagnosis Date Noted  . Amaurosis fugax 08/16/2014  . 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 Discoloration of skin of foot 03/15/2014  . PD (Parkinson's disease) ([place]) 03/15/2014  . Cerebral infarction due to embolism of cerebral artery ([place]) 03/15/2014  . Essential hypertension 03/15/2014  . HLD (hyperlipidemia) 03/15/2014  . Cough   . LV (left ventricular) mural thrombus ([place])   . CVA (cerebral infarction) 01/18/2014  . Imbalance 01/18/2014  . Slurring of speech   . Abnormality of gait 03/12/2012  . Paralysis agitans ([place]) 03/12/2012  . Acute cholecystitis 02/04/2012  . Hypertension 02/04/2012  . Hyperlipidemia 02/04/2012    [facility] , [place], CCC-SLP  12/09/2014, 4:34 PM  [facility] [address] [place], [place], [address] Phone: [phone]   Fax:  [phone]   Name: Kyle Cameron MRN: [id number] Date of Birth: 29-Jun-1926

## 2014-12-09 NOTE — Therapy (Signed)
[facility] [address] [place], [place], [address] Phone: [phone]   Fax:  [phone]  Occupational Therapy Treatment  Patient Details  Name: Kyle Cameron MRN: [id number] Date of Birth: 1926-07-02 Referring Provider: Dr. Jannifer Franklin  Encounter Date: 12/09/2014      OT End of Session - 12/09/14 1627    Visit Number 5   Number of Visits 17   Date for OT Re-Evaluation 01/21/15   Authorization Type 1Medicare, 2AARP, G-code needed   Authorization Time Period kx   Authorization - Visit Number 5   Authorization - Number of Visits 10   OT Start Time 1103   OT Stop Time 1145   OT Time Calculation (min) 42 min   Activity Tolerance Patient tolerated treatment well   Behavior During Therapy [name] for tasks assessed/performed      Past Medical History  Diagnosis Date  . Hypertension   . High cholesterol   . Prostate atrophy   . GERD (gastroesophageal reflux disease)   . History of diverticulitis of colon   . Benign enlargement of prostate   . Hypothyroidism   . History of renal calculi   . Parkinson's disease   . Renal calculi   . CVA (cerebral infarction)   . Stroke   . Anticoagulated on Coumadin   . Headache   . Labyrinthitis   . Grover's disease   . Cataracts, bilateral   . Parkinson's disease   . Amaurosis fugax 08/16/2014    Right eye    Past Surgical History  Procedure Laterality Date  . Cholecystectomy  02/04/2012    Procedure: LAPAROSCOPIC CHOLECYSTECTOMY WITH INTRAOPERATIVE CHOLANGIOGRAM;  Surgeon: Merrie Roof, MD;  Location: [facility];  Service: General;  Laterality: N/A;  . Lithotripsy      renal calculi  . Tonsillectomy    . Cataract extraction, bilateral      There were no vitals filed for this visit.  Visit Diagnosis:  Rigidity  Hypokinesia  Decreased functional mobility and endurance  Lack of coordination      Subjective Assessment - 12/09/14 1104    Pertinent History Pt with  PD (diagnosed 3 years ago) with 2 CVAs  Pt reports PD worse after CVA.  Pt reports increased difficulty with speech, R hand, and walking.   Patient Stated Goals improve balance/ walking, use of RUE   Currently in Pain? Yes   Pain Score 3    Pain Location Foot   Pain Descriptors / Indicators Burning   Pain Onset More than a month ago   Aggravating Factors  walking    Pain Relieving Factors sitting         Treatment: [facility]) - 12/07/14 1621      PWR! exercises  Moves in sitting     PWR! Up  x10     PWR! Rock  x10     PWR! Twist  x10     PWR! Step  x10     Comments  with min-mod cues (verbal and visual) for timing and exaggerated slow movements and big amplitude movements       functional step and reach placing checkers in targets, and twist and reach for overhead cabinets, emphasis on slower, bigger movements, with PWR! Hands prior to grasp/ release of checkers. Pt ambulated with U-step walker from gym to ST with minimal freezing/ difficulty  OT Short Term Goals - 11/23/14 1505    OT SHORT TERM GOAL #1   Title Pt will be independent with updated HEP.--due 12/31/14   Time 4   Period Weeks   Status New   OT SHORT TERM GOAL #2   Title Pt will improve coordination as shown by completing 9-hole peg test in 90 secs or less with dominant R hand.-   Baseline 102 secs   Time 4   Period Weeks   Status New   OT SHORT TERM GOAL #3   Title Pt will be able to write at least 3 sentences with at least 50% legibility.   Time 4   Period Weeks   Status New   OT SHORT TERM GOAL #4   Title Pt will demonstrate ability to perfom RUE elbow extension at -10 for improved functional reach   Baseline baseline -18   Time 4   Period Weeks   Status New   OT SHORT TERM GOAL #5   Title ------------------------------------------------------------------------------------------------------------------------------------------           OT Long  Term Goals - 11/23/14 1507    OT LONG TERM GOAL #1   Title Pt will verbalize understanding of adaptive strategies for increased safety and independence with  ADLs/IADLs prn    Status New   OT LONG TERM GOAL #2   Title Pt will improve coordination as shown by completing 9-hole peg test in 60 secs or less with right dominant hand   Status New   OT LONG TERM GOAL #3   Title Pt will demonstrate improved functional reaching/ coordination for ADLs as evidenced by improving box/ blocks score to 26 blocks for RUE.   Status New   OT LONG TERM GOAL #4   Title Pt will demonstrate ability to retrieve lightweight items at 110 shoulder flexion with RUE.   Baseline baseline 105 for RUE   OT LONG TERM GOAL #5   Title Pt will demonstrate ability to perfrom PPT# 4(donning jacket in 25 secs or less without loss of balance)   Time 8   Period Weeks   Status New   OT LONG TERM GOAL #6   Title Pt will perform simple cooking/ home management in standing with supervision without LOB.   Time 8   Period Weeks   Status New               Plan - 12/09/14 1622    Clinical Impression Statement Pt is progressing towards goals. He demonstrated improved timing and less freezing episodes with exaggerated slow movements. Pt demonstrates improved mobility using the nu-step.   Pt will benefit from skilled therapeutic intervention in order to improve on the following deficits (Retired) Decreased balance;Decreased knowledge of use of DME;Impaired UE functional use;Impaired perceived functional ability;Decreased strength;Decreased mobility;Decreased activity tolerance;Impaired tone;Impaired sensation;Decreased safety awareness;Decreased endurance;Decreased coordination   Rehab Potential Good   Clinical Impairments Affecting Rehab Potential speed and timing.   OT Frequency 2x / week   OT Duration 8 weeks   OT Treatment/Interventions Self-care/ADL training;Moist Heat;DME and/or AE  instruction;Fluidtherapy;Splinting;Patient/family education;Balance training;Therapeutic exercises;Therapeutic exercise;Therapeutic activities;Functional Mobility Training;Neuromuscular education;Cryotherapy;Energy conservation;Manual Therapy;Electrical Stimulation   Plan continue to reinforce ADLS/ functional movements with exaggerated slowness   OT Home Exercise Plan Education issued:  PWR! supine (basic 4), PWR! hands, card coordination HEP   Consulted and Agree with Plan of Care Patient        Problem List Patient Active Problem List   Diagnosis Date Noted  . Amaurosis  fugax 08/16/2014  . Cerebral infarction due to embolism of left middle cerebral artery ([place]) 06/22/2014  . Discoloration of skin of foot 03/15/2014  . PD (Parkinson's disease) ([place]) 03/15/2014  . Cerebral infarction due to embolism of cerebral artery ([place]) 03/15/2014  . Essential hypertension 03/15/2014  . HLD (hyperlipidemia) 03/15/2014  . Cough   . LV (left ventricular) mural thrombus ([place])   . CVA (cerebral infarction) 01/18/2014  . Imbalance 01/18/2014  . Slurring of speech   . Abnormality of gait 03/12/2012  . Paralysis agitans ([place]) 03/12/2012  . Acute cholecystitis 02/04/2012  . Hypertension 02/04/2012  . Hyperlipidemia 02/04/2012    [facility] 12/09/2014, 4:32 PM Theone Murdoch, OTR/L [phone] Phone: [phone] 4:32 PM 12/09/2014 [facility] [address] [place], [place], [address] Phone: [phone]   Fax:  [phone]  Name: Kyle Cameron MRN: [id number] Date of Birth: February 02, 1926

## 2014-12-09 NOTE — Patient Instructions (Signed)
  Please complete the assigned speech therapy homework and return it to your next session.  

## 2014-12-09 NOTE — Telephone Encounter (Signed)
Patient's caregiver came into the office today to set up appointment for patient to receive Apokyn. I explained why [name], RN wanted to wait until 11/28. Caregiver spoke to [place], [place] and arranged for patient to come Monday (11/21) at 9 AM for his initial dose of medication. Caregiver stated they have the antinausea medication and that [name], RN instructed they start the medication today.

## 2014-12-09 NOTE — Telephone Encounter (Signed)
The patient will come in for a test dose of the drug. We will start at 0.2 mL subcutaneous. If no response, will repeat at 0.4 mL after 2 hours. Maximum doses 0.6 mL per dose up to 5 doses daily. The patient is to premedicate with Tigan.

## 2014-12-09 NOTE — Telephone Encounter (Signed)
I called Kyle Cameron. The patient has the medication now. I asked if he could come in Monday to have the first injection, as that is the only day Dr. Jannifer Franklin will be in the office next week. I explained he will need to be here for several hours to be monitored after getting it. [name] stated he would check and call me back. I advised that our phone lines close at 12 today.

## 2014-12-09 NOTE — Telephone Encounter (Signed)
Kyle Cameron a registered nurse with [facility] is calling regarding the patient. He states the patient needs an appointment to come to our office to have the medication APOMORPHINE HYDROCHLORIDE 10 MG/ML SOLN administered. The first dose of the medication has to be done under medical supervision. Please call and discuss.

## 2014-12-09 NOTE — Therapy (Signed)
[facility] [address] [place], [place], [address] Phone: [phone]   Fax:  [phone]  Physical Therapy Treatment  Patient Details  Name: Kyle Cameron MRN: [id number] Date of Birth: 04-Jan-1927 Referring Provider: Lenor Coffin  Encounter Date: 12/09/2014      PT End of Session - 12/09/14 2137    Visit Number 4   Number of Visits 17   Date for PT Re-Evaluation 01/15/15   Authorization Type Medicare G-code every 10th visit   PT Start Time 1018   PT Stop Time 1100   PT Time Calculation (min) 42 min   Equipment Utilized During Treatment Gait belt   Activity Tolerance Patient tolerated treatment well   Behavior During Therapy [facility] for tasks assessed/performed      Past Medical History  Diagnosis Date  . Hypertension   . High cholesterol   . Prostate atrophy   . GERD (gastroesophageal reflux disease)   . History of diverticulitis of colon   . Benign enlargement of prostate   . Hypothyroidism   . History of renal calculi   . Parkinson's disease   . Renal calculi   . CVA (cerebral infarction)   . Stroke   . Anticoagulated on Coumadin   . Headache   . Labyrinthitis   . Grover's disease   . Cataracts, bilateral   . Parkinson's disease   . Amaurosis fugax 08/16/2014    Right eye    Past Surgical History  Procedure Laterality Date  . Cholecystectomy  02/04/2012    Procedure: LAPAROSCOPIC CHOLECYSTECTOMY WITH INTRAOPERATIVE CHOLANGIOGRAM;  Surgeon: Merrie Roof, MD;  Location: [facility];  Service: General;  Laterality: N/A;  . Lithotripsy      renal calculi  . Tonsillectomy    . Cataract extraction, bilateral      There were no vitals filed for this visit.  Visit Diagnosis:  Festinating gait  Decreased functional mobility and endurance  Abnormality of gait      Subjective Assessment - 12/09/14 1022    Subjective No changes since last visit; did not hurt myself with lowering myself  down in the shower.   Currently in Pain? No/denies                         [facility] Adult PT Treatment/Exercise - 12/09/14 1023    Transfers   Transfers Sit to Stand;Stand to Sit   Sit to Stand 5: Supervision   Stand to Sit 5: Supervision   Ambulation/Gait   Ambulation/Gait Yes   Ambulation/Gait Assistance 4: Min guard;5: Supervision   Ambulation/Gait Assistance Details Trial use of U-step RW during therapy session   Ambulation Distance (Feet) 800 Feet  200 x 2 (negotiating around furniture)   International aid/development worker;Other (Comment)  U-step without laser or metronome   Gait Pattern Narrow base of support;Poor foot clearance - left;Poor foot clearance - right   Ambulation Surface Level;Indoor   Pre-Gait Activities Practiced quarter turns from counter to U-step RW, multiple reps, using quarter turn method to R and L, no freezing episodes noted, with cues and supervision   Gait Comments Several episodes of freezing of gait noted with turns, tight spaces while using U-step RW.  With cues to stop and reset posture, pt able to start again wihtout difficulty of freezing episode.  Pt negotiates around furniture, narrow spaces, turns, and doorways using U-step RW, with cues and supervision.      Neuro Re-education: At  counter, side step and weightshift x 10 reps, back step and weigthshift x 10 reps, then stagger stance weightshift x 10 reps each position, lateral weightshift x 10 reps, forward step and weightshift x 10 reps.           PT Education - 12/09/14 2137    Education provided Yes   Education Details benefits of U-step RW and brochure on phone number/website for U-step RW, as pt is interested in obtaining   Person(s) Educated Patient   Methods Explanation;Demonstration;Handout   Comprehension Verbalized understanding          PT Short Term Goals - 11/16/14 1331    PT SHORT TERM GOAL #1   Title Pt will perform HEP with family/caregiver supervision for  improved transfers, balance and gait.  TARGET 12/16/14   Time 4   Period Weeks   Status New   PT SHORT TERM GOAL #2   Title Pt will improve 5x sit<>stand to less than or equal to 15 seconds with no posterior lean for improved transfer efficiency and safety.   Time 4   Period Weeks   Status New   PT SHORT TERM GOAL #3   Title Pt will verbalize/demonstrate understanding of techniques to reduce freezing episodes with gait.   Time 4   Period Weeks   Status New   PT SHORT TERM GOAL #4   Title Pt will be able to initiate gait within less than 30 seconds upon standing    Time 4   Period Weeks   Status New           PT Long Term Goals - 11/16/14 1335    PT LONG TERM GOAL #1   Title Pt will verbalize/demonstrate understanding of fall prevention techniques within the home environment.  (TARGET 01/15/15)   Time 8   Period Weeks   Status New   PT LONG TERM GOAL #2   Title Pt will improve gait velocity to at least 2.3 ft/sec for improved efficiency and safety with gait.   Time 8   Period Weeks   Status New   PT LONG TERM GOAL #3   Title Pt will be able to complete TUG test in less than 2 minutes, to demonstrate improved short distance functional mobility within the home.   Time 8   Period Weeks   Status New   PT LONG TERM GOAL #4   Title Further assessment for assistive device to be completed for additional safety within home and community.   Time 8   Period Weeks   Status New   PT LONG TERM GOAL #5   Title Pt will verbalize plans for continued community fitness upon D/C from PT.   Time 8   Period Weeks   Status New               Plan - 12/09/14 2138    Clinical Impression Statement Pt's overall gait pattern and ability to control/decrease freezing episodes is improved significantly by using U-step rolling walker.  Pt continues to need cues for pacing and slowing of movement with exercises and with turns.   Pt will benefit from skilled therapeutic intervention in  order to improve on the following deficits Abnormal gait;Decreased activity tolerance;Decreased balance;Decreased mobility;Decreased strength;Difficulty walking;Postural dysfunction   Rehab Potential Good   PT Frequency 2x / week   PT Duration 8 weeks  plus eval   PT Treatment/Interventions ADLs/Self Care Home Management;Therapeutic exercise;Therapeutic activities;Functional mobility training;Gait training;DME Instruction;Balance training;Neuromuscular re-education;Patient/family education  PT Next Visit Plan practice weigthshifting, dynamic single limb stance activities; transfers; gait with U-step RW; begin checking STGs   Consulted and Agree with Plan of Care Patient        Problem List Patient Active Problem List   Diagnosis Date Noted  . Amaurosis fugax 08/16/2014  . Cerebral infarction due to embolism of left middle cerebral artery ([place]) 06/22/2014  . Discoloration of skin of foot 03/15/2014  . PD (Parkinson's disease) ([place]) 03/15/2014  . Cerebral infarction due to embolism of cerebral artery ([place]) 03/15/2014  . Essential hypertension 03/15/2014  . HLD (hyperlipidemia) 03/15/2014  . Cough   . LV (left ventricular) mural thrombus ([place])   . CVA (cerebral infarction) 01/18/2014  . Imbalance 01/18/2014  . Slurring of speech   . Abnormality of gait 03/12/2012  . Paralysis agitans ([place]) 03/12/2012  . Acute cholecystitis 02/04/2012  . Hypertension 02/04/2012  . Hyperlipidemia 02/04/2012    Niesha Bame W. 12/09/2014, 9:41 PM  Frazier Butt., PT  [facility] [address] [place], [place], [address] Phone: [phone]   Fax:  [phone]  Name: Patrickjames Hacker MRN: [id number] Date of Birth: [birth year]

## 2014-12-09 NOTE — Telephone Encounter (Signed)
Charles called back. He is scheduled to meet with the patient 11/23. He asked if the initial dose could be given the next week (after that meeting). He will call back Monday to set a definite date and time for the initial dose.

## 2014-12-12 ENCOUNTER — Ambulatory Visit (INDEPENDENT_AMBULATORY_CARE_PROVIDER_SITE_OTHER): Payer: Medicare Other | Admitting: Neurology

## 2014-12-12 ENCOUNTER — Ambulatory Visit: Payer: Medicare Other | Admitting: Physical Therapy

## 2014-12-12 ENCOUNTER — Encounter: Payer: Self-pay | Admitting: Neurology

## 2014-12-12 ENCOUNTER — Ambulatory Visit: Payer: Medicare Other | Admitting: Occupational Therapy

## 2014-12-12 ENCOUNTER — Ambulatory Visit: Payer: Medicare Other | Admitting: Speech Pathology

## 2014-12-12 VITALS — BP 129/70 | HR 103 | Ht 67.0 in

## 2014-12-12 DIAGNOSIS — I6340 Cerebral infarction due to embolism of unspecified cerebral artery: Secondary | ICD-10-CM

## 2014-12-12 DIAGNOSIS — G20 Parkinson's disease: Secondary | ICD-10-CM | POA: Diagnosis not present

## 2014-12-12 DIAGNOSIS — R269 Unspecified abnormalities of gait and mobility: Secondary | ICD-10-CM

## 2014-12-12 NOTE — Progress Notes (Signed)
Reason for visit: Parkinson's disease  Kyle Cameron is an 81 y.o. male  History of present illness:  Kyle Cameron is an 81 year old right-handed white male with a history of Parkinson's disease. Since last seen, he was having increasingly significant issues with freezing. The freezing remains a significant problem, generally worse in the afternoon or evening time. The patient has been careful about taking his Sinemet before he eats, and this has helped his overall functional level. The patient has been set up for the use of Apokyn. He comes in today for this purpose. He has not had any falls, he denies issues with swallowing. He does report some tingling sensations in the feet that is slightly worse, this has been present over the last 2 years.  Past Medical History  Diagnosis Date  . Hypertension   . High cholesterol   . Prostate atrophy   . GERD (gastroesophageal reflux disease)   . History of diverticulitis of colon   . Benign enlargement of prostate   . Hypothyroidism   . History of renal calculi   . Parkinson's disease ([place])   . Renal calculi   . CVA (cerebral infarction)   . Stroke ([place])   . Anticoagulated on Coumadin   . Headache   . Labyrinthitis   . Grover's disease   . Cataracts, bilateral   . Parkinson's disease ([place])   . Amaurosis fugax 08/16/2014    Right eye    Past Surgical History  Procedure Laterality Date  . Cholecystectomy  02/04/2012    Procedure: LAPAROSCOPIC CHOLECYSTECTOMY WITH INTRAOPERATIVE CHOLANGIOGRAM;  Surgeon: Merrie Roof, MD;  Location: [facility];  Service: General;  Laterality: N/A;  . Lithotripsy      renal calculi  . Tonsillectomy    . Cataract extraction, bilateral      Family History  Problem Relation Age of Onset  . Parkinsonism Mother   . Heart disease Father   . Esophageal cancer Sister     Social history:  reports that he has quit smoking. He has never used smokeless tobacco. He reports that he drinks about 4.2 oz of alcohol  per week. He reports that he does not use illicit drugs.    Allergies  Allergen Reactions  . Ace Inhibitors Other (See Comments)    Medications:  Prior to Admission medications   Medication Sig Start Date End Date Taking? Authorizing Provider  acetaminophen (TYLENOL) 500 MG tablet Take 500 mg by mouth every 8 (eight) hours as needed for mild pain or moderate pain.   Yes Historical Provider, MD  APOMORPHINE HYDROCHLORIDE 10 MG/ML SOLN Inject 0.2 mLs (2 mg total) into the skin as needed (Max dose of 0.5mL five times daily). 12/02/14  Yes Kathrynn Ducking, MD  atorvastatin (LIPITOR) 20 MG tablet Take 20 mg by mouth daily. 07/20/14  Yes Historical Provider, MD  carbidopa-levodopa (SINEMET IR) 25-100 MG per tablet Take 2 tablets by mouth 3 (three) times daily. 09/30/14  Yes Kathrynn Ducking, MD  carbidopa-levodopa (SINEMET IR) 25-100 MG tablet Take 2 tablets by mouth 3 (three) times daily. 11/30/14  Yes Kathrynn Ducking, MD  cholecalciferol (VITAMIN D) 1000 UNITS tablet Take 1,000 Units by mouth daily.   Yes Historical Provider, MD  dutasteride (AVODART) 0.5 MG capsule Take 0.5 mg by mouth every other day.   Yes Historical Provider, MD  levothyroxine (SYNTHROID, LEVOTHROID) 50 MCG tablet Take 50 mcg by mouth daily before breakfast.   Yes Historical Provider, MD  losartan-hydrochlorothiazide Konrad Penta)  100-25 MG per tablet Take 1 tablet by mouth daily.   Yes Historical Provider, MD  pramipexole (MIRAPEX) 0.125 MG tablet One tablet three times a day for 2 weeks, then take 2 tablets three times a day Patient taking differently: Take 0.125 mg by mouth 3 (three) times daily.  08/16/14  Yes Kathrynn Ducking, MD  selegiline (ELDEPRYL) 5 MG tablet TAKE ONE TABLET BY MOUTH TWICE DAILY WITH MEALS (TAKE WITH BREAKFAST AND LUNCH) 10/03/14  Yes Kathrynn Ducking, MD  trimethobenzamide (TIGAN) 300 MG capsule Take 1 capsule (300 mg total) by mouth 3 (three) times daily. 10/14/14  Yes Kathrynn Ducking, MD  warfarin  (COUMADIN) 5 MG tablet Take 5 mg by mouth daily. 5mg  Mon.-Fri, 7.5mg  Sat & Sun   Yes Historical Provider, MD    ROS:  Out of a complete 14 system review of symptoms, the patient complains only of the following symptoms, and all other reviewed systems are negative.  Gait disturbance, freezing Numbness in the feet   Blood pressure 129/70, pulse 103, height 5\' 7"  (1.702 m).  Physical Exam  General: The patient is alert and cooperative at the time of the examination.  Skin: No significant peripheral edema is noted.   Neurologic Exam  Mental status: The patient is alert and oriented x 3 at the time of the examination. The patient has apparent normal recent and remote memory, with an apparently normal attention span and concentration ability.   Cranial nerves: Facial symmetry is present. Speech is slightly dysphonic. Extraocular movements are full. Visual fields are full. Masking of the face is seen.  Motor: The patient has good strength in all 4 extremities.  Sensory examination: Soft touch sensation is symmetric on the face, arms, and legs.  Coordination: The patient has good finger-nose-finger and heel-to-shin bilaterally.  Gait and station: The patient normally walks with a walker. He is able to ambulate independently today, he will freeze with turns, on the straight away, he has relatively good stride. Romberg is negative. Tandem gait was not attended.  Reflexes: Deep tendon reflexes are symmetric.   Assessment/Plan:  1. Parkinson's disease  2. Gait disorder  The patient is continued to have freezing episodes, but his ability to ambulate has improved with separating the Sinemet from meals. The patient will be given a test dose of Apokyn today. He will follow-up for his next scheduled revisit.  Jill Alexanders MD 12/12/2014 9:03 PM  [facility] [address] [place], [place] [address]  Phone [phone] Fax [phone]

## 2014-12-13 ENCOUNTER — Ambulatory Visit: Payer: Medicare Other | Admitting: Occupational Therapy

## 2014-12-13 ENCOUNTER — Telehealth: Payer: Self-pay | Admitting: Neurology

## 2014-12-13 ENCOUNTER — Ambulatory Visit: Payer: Medicare Other | Admitting: Physical Therapy

## 2014-12-13 DIAGNOSIS — R279 Unspecified lack of coordination: Secondary | ICD-10-CM

## 2014-12-13 DIAGNOSIS — Z7409 Other reduced mobility: Secondary | ICD-10-CM

## 2014-12-13 DIAGNOSIS — R6889 Other general symptoms and signs: Secondary | ICD-10-CM

## 2014-12-13 DIAGNOSIS — R269 Unspecified abnormalities of gait and mobility: Secondary | ICD-10-CM

## 2014-12-13 DIAGNOSIS — R29898 Other symptoms and signs involving the musculoskeletal system: Secondary | ICD-10-CM

## 2014-12-13 DIAGNOSIS — R258 Other abnormal involuntary movements: Secondary | ICD-10-CM | POA: Diagnosis not present

## 2014-12-13 NOTE — Therapy (Signed)
[facility] [address] [place], [place], [address] Phone: [phone]   Fax:  [phone]  Occupational Therapy Treatment  Patient Details  Name: Kyle Cameron MRN: [id number] Date of Birth: 10/22/1926 Referring Provider: Dr. Jannifer Franklin  Encounter Date: 12/13/2014      OT End of Session - 12/13/14 1212    Visit Number 6   Number of Visits 17   Date for OT Re-Evaluation 01/21/15   Authorization Type 1Medicare, 2AARP, G-code needed   Authorization Time Period kx   Authorization - Visit Number 6   Authorization - Number of Visits 10   OT Start Time 1146   OT Stop Time 1230   OT Time Calculation (min) 44 min   Activity Tolerance Patient tolerated treatment well   Behavior During Therapy [facility] for tasks assessed/performed      Past Medical History  Diagnosis Date  . Hypertension   . High cholesterol   . Prostate atrophy   . GERD (gastroesophageal reflux disease)   . History of diverticulitis of colon   . Benign enlargement of prostate   . Hypothyroidism   . History of renal calculi   . Parkinson's disease ([place])   . Renal calculi   . CVA (cerebral infarction)   . Stroke ([place])   . Anticoagulated on Coumadin   . Headache   . Labyrinthitis   . Grover's disease   . Cataracts, bilateral   . Parkinson's disease ([place])   . Amaurosis fugax 08/16/2014    Right eye    Past Surgical History  Procedure Laterality Date  . Cholecystectomy  02/04/2012    Procedure: LAPAROSCOPIC CHOLECYSTECTOMY WITH INTRAOPERATIVE CHOLANGIOGRAM;  Surgeon: Merrie Roof, MD;  Location: [facility];  Service: General;  Laterality: N/A;  . Lithotripsy      renal calculi  . Tonsillectomy    . Cataract extraction, bilateral      There were no vitals filed for this visit.  Visit Diagnosis:  Rigidity  Hypokinesia  Decreased functional mobility and endurance  Lack of coordination         PWR! exercises basic 4, with v.c. for  slower/ larger  movement,  10 reps to each side Fine motor coordination task with bilateral UE's to copy small peg design, mod/ max difficulty with RUE, and min difficulty with LUE, mod v.c. Pt demonstrated freezing episodes during task. Standing for dynamic functional reaching with right then left UE's min v.c for larger movements/ supervision for balance Pt ambulated with U-step walker from gym to front office with several freezing episodes min v.c                      OT Short Term Goals - 11/23/14 1505    OT SHORT TERM GOAL #1   Title Pt will be independent with updated HEP.--due 12/31/14   Time 4   Period Weeks   Status New   OT SHORT TERM GOAL #2   Title Pt will improve coordination as shown by completing 9-hole peg test in 90 secs or less with dominant R hand.-   Baseline 102 secs   Time 4   Period Weeks   Status New   OT SHORT TERM GOAL #3   Title Pt will be able to write at least 3 sentences with at least 50% legibility.   Time 4   Period Weeks   Status New   OT SHORT TERM GOAL #4   Title Pt will demonstrate  ability to perfom RUE elbow extension at -10 for improved functional reach   Baseline baseline -18   Time 4   Period Weeks   Status New   OT SHORT TERM GOAL #5   Title ------------------------------------------------------------------------------------------------------------------------------------------           OT Long Term Goals - 11/23/14 1507    OT LONG TERM GOAL #1   Title Pt will verbalize understanding of adaptive strategies for increased safety and independence with  ADLs/IADLs prn    Status New   OT LONG TERM GOAL #2   Title Pt will improve coordination as shown by completing 9-hole peg test in 60 secs or less with right dominant hand   Status New   OT LONG TERM GOAL #3   Title Pt will demonstrate improved functional reaching/ coordination for ADLs as evidenced by improving box/ blocks score to 26 blocks for RUE.   Status New    OT LONG TERM GOAL #4   Title Pt will demonstrate ability to retrieve lightweight items at 110 shoulder flexion with RUE.   Baseline baseline 105 for RUE   OT LONG TERM GOAL #5   Title Pt will demonstrate ability to perfrom PPT# 4(donning jacket in 25 secs or less without loss of balance)   Time 8   Period Weeks   Status New   OT LONG TERM GOAL #6   Title Pt will perform simple cooking/ home management in standing with supervision without LOB.   Time 8   Period Weeks   Status New               Plan - 12/13/14 1152    Clinical Impression Statement Pt is progressing towards goals . He reports starting the PRN injections to help with freezing episodes yeterday.   Pt will benefit from skilled therapeutic intervention in order to improve on the following deficits (Retired) Decreased balance;Decreased knowledge of use of DME;Impaired UE functional use;Impaired perceived functional ability;Decreased strength;Decreased mobility;Decreased activity tolerance;Impaired tone;Impaired sensation;Decreased safety awareness;Decreased endurance;Decreased coordination   OT Frequency 2x / week   OT Duration 8 weeks   OT Treatment/Interventions Self-care/ADL training;Moist Heat;DME and/or AE instruction;Fluidtherapy;Splinting;Patient/family education;Balance training;Therapeutic exercises;Therapeutic exercise;Therapeutic activities;Functional Mobility Training;Neuromuscular education;Cryotherapy;Energy conservation;Manual Therapy;Electrical Stimulation   Plan reinforce ADLS/ functional movements with exaggerated slowness.   OT Home Exercise Plan Education issued:  PWR! supine (basic 4), PWR! hands, card coordination HEP   Consulted and Agree with Plan of Care Patient        Problem List Patient Active Problem List   Diagnosis Date Noted  . Amaurosis fugax 08/16/2014  . Cerebral infarction due to embolism of left middle cerebral artery ([place]) 06/22/2014  . Discoloration of skin of foot 03/15/2014   . PD (Parkinson's disease) ([place]) 03/15/2014  . Cerebral infarction due to embolism of cerebral artery ([place]) 03/15/2014  . Essential hypertension 03/15/2014  . HLD (hyperlipidemia) 03/15/2014  . Cough   . LV (left ventricular) mural thrombus ([place])   . CVA (cerebral infarction) 01/18/2014  . Imbalance 01/18/2014  . Slurring of speech   . Abnormality of gait 03/12/2012  . Paralysis agitans ([place]) 03/12/2012  . Acute cholecystitis 02/04/2012  . Hypertension 02/04/2012  . Hyperlipidemia 02/04/2012    Assata Juncaj 12/13/2014, 12:14 PM Theone Murdoch, OTR/L [phone] Phone: [phone] 12:14 PM 12/13/2014 [facility] [address] [place], [place], [address] Phone: [phone]   Fax:  [phone]  Name: Kyle Cameron MRN: [id number] Date of Birth: Feb 04, 1926

## 2014-12-13 NOTE — Telephone Encounter (Signed)
Pt's caregiver called requesting RX for U-step, mobility device specifically for parkinson's. Medicare has approved, please be watching for fax. He was calling to give [facility] heads up.

## 2014-12-13 NOTE — Therapy (Addendum)
[facility] [address] [place], [place], [address] Phone: [phone]   Fax:  [phone]  Physical Therapy Treatment  Patient Details  Name: Kyle Cameron MRN: [id number] Date of Birth: [birth year] Referring Provider: Lenor Coffin  Encounter Date: 12/13/2014      PT End of Session - 12/13/14 1446    Visit Number 5   Number of Visits 17   Date for PT Re-Evaluation 01/15/15   Authorization Type Medicare G-code every 10th visit   PT Start Time 1104   PT Stop Time 1145   PT Time Calculation (min) 41 min   Equipment Utilized During Treatment Gait belt   Activity Tolerance Patient tolerated treatment well   Behavior During Therapy [facility] for tasks assessed/performed      Past Medical History  Diagnosis Date  . Hypertension   . High cholesterol   . Prostate atrophy   . GERD (gastroesophageal reflux disease)   . History of diverticulitis of colon   . Benign enlargement of prostate   . Hypothyroidism   . History of renal calculi   . Parkinson's disease ([place])   . Renal calculi   . CVA (cerebral infarction)   . Stroke ([place])   . Anticoagulated on Coumadin   . Headache   . Labyrinthitis   . Grover's disease   . Cataracts, bilateral   . Parkinson's disease ([place])   . Amaurosis fugax 08/16/2014    Right eye    Past Surgical History  Procedure Laterality Date  . Cholecystectomy  02/04/2012    Procedure: LAPAROSCOPIC CHOLECYSTECTOMY WITH INTRAOPERATIVE CHOLANGIOGRAM;  Surgeon: Merrie Roof, MD;  Location: [facility];  Service: General;  Laterality: N/A;  . Lithotripsy      renal calculi  . Tonsillectomy    . Cataract extraction, bilateral      There were no vitals filed for this visit.  Visit Diagnosis:  Abnormality of gait      Subjective Assessment - 12/13/14 1113    Subjective Pt reports having a fall Sunday night while getting up to go to the bathroom.  Has a bruise on R forearm but denies pain.    Pertinent History CVA 12/26/13 and 01/18/14, Parkinson's disease   Patient Stated Goals Pt's goal for therapy is to have therapy in conjunction with Apomorphine to walk better.   Currently in Pain? Yes   Pain Score 5    Pain Location Foot   Pain Orientation Right;Left   Pain Descriptors / Indicators Burning   Pain Type Chronic pain   Pain Onset More than a month ago   Pain Frequency Intermittent   Aggravating Factors  walking   Pain Relieving Factors sitting                         [facility] Adult PT Treatment/Exercise - 12/13/14 1440    Transfers   Transfers Sit to Stand;Stand to Sit   Sit to Stand 5: Supervision   Sit to Stand Details Other (comment)  repeated x 10 with cues for forward weight shift   Sit to Stand Details (indicate cue type and reason) leans posteriorly on 5 out of 10 sit<>stand   Stand to Sit 5: Supervision   Comments Round robin transfers in parallel bars<>chair going both directions with moderate cues for BOS, weight shift and step size   Ambulation/Gait   Ambulation/Gait Yes   Ambulation/Gait Assistance 4: Min guard;5: Supervision   Ambulation/Gait Assistance Details  trialed Ustep   Ambulation Distance (Feet) 800 Feet  plus   Assistive device Other (Comment)  Ustep   Gait Pattern Narrow base of support;Poor foot clearance - left;Poor foot clearance - right   Ambulation Surface Level;Indoor   Gait Comments Frequent freezing today with gait despite using Ustep.  Pt needed increased time and cues to use tips to reduce freezing.  Pt reports he took his new medication/injection at 6 am and oral Sinemet at 8 am and feels like freezing is better yet appears worse today to this PTA.   High Level Balance   High Level Balance Comments Standing in parallel bars for forward, backward and forward/backward step weight shifts with initial bil UE progressing to intermittent UE support.  Taps to cones alternating LE's with intermittent UE support of bars.                   PT Short Term Goals - 11/16/14 1331    PT SHORT TERM GOAL #1   Title Pt will perform HEP with family/caregiver supervision for improved transfers, balance and gait.  TARGET 12/16/14   Time 4   Period Weeks   Status New   PT SHORT TERM GOAL #2   Title Pt will improve 5x sit<>stand to less than or equal to 15 seconds with no posterior lean for improved transfer efficiency and safety.   Time 4   Period Weeks   Status New   PT SHORT TERM GOAL #3   Title Pt will verbalize/demonstrate understanding of techniques to reduce freezing episodes with gait.   Time 4   Period Weeks   Status New   PT SHORT TERM GOAL #4   Title Pt will be able to initiate gait within less than 30 seconds upon standing    Time 4   Period Weeks   Status New           PT Long Term Goals - 11/16/14 1335    PT LONG TERM GOAL #1   Title Pt will verbalize/demonstrate understanding of fall prevention techniques within the home environment.  (TARGET 01/15/15)   Time 8   Period Weeks   Status New   PT LONG TERM GOAL #2   Title Pt will improve gait velocity to at least 2.3 ft/sec for improved efficiency and safety with gait.   Time 8   Period Weeks   Status New   PT LONG TERM GOAL #3   Title Pt will be able to complete TUG test in less than 2 minutes, to demonstrate improved short distance functional mobility within the home.   Time 8   Period Weeks   Status New   PT LONG TERM GOAL #4   Title Further assessment for assistive device to be completed for additional safety within home and community.   Time 8   Period Weeks   Status New   PT LONG TERM GOAL #5   Title Pt will verbalize plans for continued community fitness upon D/C from PT.   Time 8   Period Weeks   Status New               Plan - 12/13/14 1446    Clinical Impression Statement Pt with increased freezing today and needing moderate cues to get out of freeze.  Pt continues to need cues as well for pacing,  slowing movement and forward weight shifting with sit<>stand.  Continue PT per POC.   Pt will benefit from skilled therapeutic intervention  in order to improve on the following deficits Abnormal gait;Decreased activity tolerance;Decreased balance;Decreased mobility;Decreased strength;Difficulty walking;Postural dysfunction   Rehab Potential Good   PT Frequency 2x / week   PT Duration 8 weeks  plus eval   PT Treatment/Interventions ADLs/Self Care Home Management;Therapeutic exercise;Therapeutic activities;Functional mobility training;Gait training;DME Instruction;Balance training;Neuromuscular re-education;Patient/family education   PT Next Visit Plan Check STG's   Consulted and Agree with Plan of Care Patient        Problem List Patient Active Problem List   Diagnosis Date Noted  . Amaurosis fugax 08/16/2014  . Cerebral infarction due to embolism of left middle cerebral artery ([place]) 06/22/2014  . Discoloration of skin of foot 03/15/2014  . PD (Parkinson's disease) ([place]) 03/15/2014  . Cerebral infarction due to embolism of cerebral artery ([place]) 03/15/2014  . Essential hypertension 03/15/2014  . HLD (hyperlipidemia) 03/15/2014  . Cough   . LV (left ventricular) mural thrombus ([place])   . CVA (cerebral infarction) 01/18/2014  . Imbalance 01/18/2014  . Slurring of speech   . Abnormality of gait 03/12/2012  . Paralysis agitans ([place]) 03/12/2012  . Acute cholecystitis 02/04/2012  . Hypertension 02/04/2012  . Hyperlipidemia 02/04/2012    [name] 12/13/2014, 2:59 PM  [facility] [address] [place], [place], [address] Phone: [phone]   Fax:  [phone]  Name: Kyle Cameron MRN: [id number] Date of Birth: 1926-08-02    [name], [place] 12/13/2014 2:59 PM Phone: [phone] Fax: [phone]

## 2014-12-14 NOTE — Telephone Encounter (Signed)
Form received. Will get Dr. Jannifer Franklin to sign Monday (11/28) when he returns to the office.

## 2014-12-20 ENCOUNTER — Ambulatory Visit: Payer: Medicare Other | Admitting: Occupational Therapy

## 2014-12-20 ENCOUNTER — Encounter: Payer: Self-pay | Admitting: Occupational Therapy

## 2014-12-20 ENCOUNTER — Ambulatory Visit: Payer: Medicare Other | Admitting: Physical Therapy

## 2014-12-20 ENCOUNTER — Ambulatory Visit: Payer: Medicare Other | Admitting: Speech Pathology

## 2014-12-20 DIAGNOSIS — R2689 Other abnormalities of gait and mobility: Secondary | ICD-10-CM

## 2014-12-20 DIAGNOSIS — R6889 Other general symptoms and signs: Secondary | ICD-10-CM

## 2014-12-20 DIAGNOSIS — G20 Parkinson's disease: Secondary | ICD-10-CM

## 2014-12-20 DIAGNOSIS — R258 Other abnormal involuntary movements: Secondary | ICD-10-CM | POA: Diagnosis not present

## 2014-12-20 DIAGNOSIS — R4701 Aphasia: Secondary | ICD-10-CM

## 2014-12-20 DIAGNOSIS — R29898 Other symptoms and signs involving the musculoskeletal system: Secondary | ICD-10-CM

## 2014-12-20 DIAGNOSIS — R490 Dysphonia: Secondary | ICD-10-CM

## 2014-12-20 DIAGNOSIS — R269 Unspecified abnormalities of gait and mobility: Secondary | ICD-10-CM

## 2014-12-20 DIAGNOSIS — R279 Unspecified lack of coordination: Secondary | ICD-10-CM

## 2014-12-20 NOTE — Therapy (Signed)
[facility] [address] [place], [place], [address] Phone: [phone]   Fax:  [phone]  Occupational Therapy Treatment  Patient Details  Name: Kyle Cameron MRN: [id number] Date of Birth: 16-Aug-1926 Referring Provider: Dr. Jannifer Franklin  Encounter Date: 12/20/2014      OT End of Session - 12/20/14 1158    Visit Number 8   Number of Visits 17   Date for OT Re-Evaluation 01/21/15   Authorization Type 1Medicare, 2AARP, G-code needed   Authorization Time Period kx   Authorization - Visit Number 8   Authorization - Number of Visits 10   OT Start Time 1147   OT Stop Time 1230   OT Time Calculation (min) 43 min   Activity Tolerance Patient tolerated treatment well   Behavior During Therapy [facility] for tasks assessed/performed      Past Medical History  Diagnosis Date  . Hypertension   . High cholesterol   . Prostate atrophy   . GERD (gastroesophageal reflux disease)   . History of diverticulitis of colon   . Benign enlargement of prostate   . Hypothyroidism   . History of renal calculi   . Parkinson's disease ([place])   . Renal calculi   . CVA (cerebral infarction)   . Stroke ([place])   . Anticoagulated on Coumadin   . Headache   . Labyrinthitis   . Grover's disease   . Cataracts, bilateral   . Parkinson's disease ([place])   . Amaurosis fugax 08/16/2014    Right eye    Past Surgical History  Procedure Laterality Date  . Cholecystectomy  02/04/2012    Procedure: LAPAROSCOPIC CHOLECYSTECTOMY WITH INTRAOPERATIVE CHOLANGIOGRAM;  Surgeon: Merrie Roof, MD;  Location: [facility];  Service: General;  Laterality: N/A;  . Lithotripsy      renal calculi  . Tonsillectomy    . Cataract extraction, bilateral      There were no vitals filed for this visit.  Visit Diagnosis:  Hypokinesia  Lack of coordination      Subjective Assessment - 12/20/14 1149    Subjective  Pt reports that he fell Friday or Saturday.  Pt  received his U-step walker.   Pertinent History Pt with PD (diagnosed 3 years ago) with 2 CVAs  Pt reports PD worse after CVA.  Pt reports increased difficulty with speech, R hand, and walking.   Patient Stated Goals improve balance/ walking, use of RUE   Currently in Pain? Yes   Pain Location Elbow   Pain Orientation Right   Pain Descriptors / Indicators Sore   Pain Type Acute pain   Pain Onset In the past 7 days   Aggravating Factors  fell   Pain Relieving Factors ice                      OT Treatments/Exercises (OP) - 12/20/14 0001    ADLs   UB Dressing Pt practiced buttoning with mod difficulty.  Then practiced with button hook and pt able to return demo after initial cueing for use with incr ease.   Functional Mobility Functional reaching with trunk rotation and reaching across body with weight shift.  Pt had minor freezing episodes, but able to use strategies for freezing with min cues.  Pt with min difficulty with coordination particularly with in-hand manipulation.   Writing Practiced writing name and address.  Name with 100% legibility, address with approx 50% legibility initially.  Then, copied grocery list with approx 85%  legibility.  Practiced words with decr legibility with mod cues to slow down and write big with improvement.  Then focus on big movements to target with pre-writing (zig-zags, lines, circles, etc.) with focus on formation, size, and spacing.  Then practiced specific letters that were difficult and letter combos.                  OT Short Term Goals - 11/23/14 1505    OT SHORT TERM GOAL #1   Title Pt will be independent with updated HEP.--due 12/31/14   Time 4   Period Weeks   Status New   OT SHORT TERM GOAL #2   Title Pt will improve coordination as shown by completing 9-hole peg test in 90 secs or less with dominant R hand.-   Baseline 102 secs   Time 4   Period Weeks   Status New   OT SHORT TERM GOAL #3   Title Pt will be able  to write at least 3 sentences with at least 50% legibility.   Time 4   Period Weeks   Status New   OT SHORT TERM GOAL #4   Title Pt will demonstrate ability to perfom RUE elbow extension at -10 for improved functional reach   Baseline baseline -18   Time 4   Period Weeks   Status New   OT SHORT TERM GOAL #5   Title ------------------------------------------------------------------------------------------------------------------------------------------           OT Long Term Goals - 11/23/14 1507    OT LONG TERM GOAL #1   Title Pt will verbalize understanding of adaptive strategies for increased safety and independence with  ADLs/IADLs prn    Status New   OT LONG TERM GOAL #2   Title Pt will improve coordination as shown by completing 9-hole peg test in 60 secs or less with right dominant hand   Status New   OT LONG TERM GOAL #3   Title Pt will demonstrate improved functional reaching/ coordination for ADLs as evidenced by improving box/ blocks score to 26 blocks for RUE.   Status New   OT LONG TERM GOAL #4   Title Pt will demonstrate ability to retrieve lightweight items at 110 shoulder flexion with RUE.   Baseline baseline 105 for RUE   OT LONG TERM GOAL #5   Title Pt will demonstrate ability to perfrom PPT# 4(donning jacket in 25 secs or less without loss of balance)   Time 8   Period Weeks   Status New   OT LONG TERM GOAL #6   Title Pt will perform simple cooking/ home management in standing with supervision without LOB.   Time 8   Period Weeks   Status New               Plan - 12/20/14 1208    Clinical Impression Statement Pt progressing towards goals with improved writing legibility with repetition and practice.   Plan begin checking goals, functional movements with exaggerated slowness   OT Home Exercise Plan Education issued:  PWR! supine (basic 4), PWR! hands, card coordination HEP   Consulted and Agree with Plan of Care Patient        Problem  List Patient Active Problem List   Diagnosis Date Noted  . Amaurosis fugax 08/16/2014  . Cerebral infarction due to embolism of left middle cerebral artery ([place]) 06/22/2014  . Discoloration of skin of foot 03/15/2014  . PD (Parkinson's disease) ([place]) 03/15/2014  . Cerebral infarction due  to embolism of cerebral artery ([place]) 03/15/2014  . Essential hypertension 03/15/2014  . HLD (hyperlipidemia) 03/15/2014  . Cough   . LV (left ventricular) mural thrombus ([place])   . CVA (cerebral infarction) 01/18/2014  . Imbalance 01/18/2014  . Slurring of speech   . Abnormality of gait 03/12/2012  . Paralysis agitans ([place]) 03/12/2012  . Acute cholecystitis 02/04/2012  . Hypertension 02/04/2012  . Hyperlipidemia 02/04/2012    [facility] 12/20/2014, 2:39 PM  [facility] [address] [place], [place], [address] Phone: [phone]   Fax:  [phone]  Name: Kyle Cameron MRN: [id number] Date of Birth: 1926/12/02  Vianne Bulls, OTR/L 12/20/2014 2:40 PM

## 2014-12-20 NOTE — Therapy (Signed)
[facility] [address] [place], [place], [address] Phone: [phone]   Fax:  [phone]  Speech Language Pathology Treatment  Patient Details  Name: Kyle Cameron MRN: [id number] Date of Birth: Feb 05, 1926 Referring Provider: Jannifer Franklin, Dr. Lala Lund.  Encounter Date: 12/20/2014      End of Session - 12/20/14 1308    Visit Number 6   Number of Visits 17   Date for SLP Re-Evaluation 01/15/15   SLP Start Time 78   SLP Stop Time  1310   SLP Time Calculation (min) 39 min      Past Medical History  Diagnosis Date  . Hypertension   . High cholesterol   . Prostate atrophy   . GERD (gastroesophageal reflux disease)   . History of diverticulitis of colon   . Benign enlargement of prostate   . Hypothyroidism   . History of renal calculi   . Parkinson's disease ([place])   . Renal calculi   . CVA (cerebral infarction)   . Stroke ([place])   . Anticoagulated on Coumadin   . Headache   . Labyrinthitis   . Grover's disease   . Cataracts, bilateral   . Parkinson's disease ([place])   . Amaurosis fugax 08/16/2014    Right eye    Past Surgical History  Procedure Laterality Date  . Cholecystectomy  02/04/2012    Procedure: LAPAROSCOPIC CHOLECYSTECTOMY WITH INTRAOPERATIVE CHOLANGIOGRAM;  Surgeon: Merrie Roof, MD;  Location: [facility];  Service: General;  Laterality: N/A;  . Lithotripsy      renal calculi  . Tonsillectomy    . Cataract extraction, bilateral      There were no vitals filed for this visit.  Visit Diagnosis: Expressive aphasia  Hypokinetic Parkinsonian dysphonia      Subjective Assessment - 12/20/14 1234    Subjective "My speech is better because I have new medicine"               ADULT SLP TREATMENT - 12/20/14 1235    General Information   Behavior/Cognition Alert;Cooperative;Pleasant mood   Treatment Provided   Treatment provided Cognitive-Linquistic   Pain Assessment   Pain Assessment  No/denies pain   Cognitive-Linquistic Treatment   Treatment focused on Dysarthria   Skilled Treatment Loud /a/ average 90dB with mod cues for big breath and visual cues to maintain loud /a/ over at least 5 seconds. Oral reading at paragraph level with mod verbal cues for pt to feel like he is making a strong effort with his voice average of  72dB. Structured speech tasks with usual min to mod cues for breath support and to make strong effort - average of 69dB. Simple conversation average 69dB.    Assessment / Recommendations / Plan   Plan Continue with current plan of care   Progression Toward Goals   Progression toward goals Progressing toward goals            SLP Short Term Goals - 12/20/14 1307    SLP SHORT TERM GOAL #1   Title pt will complete loud /a/ with average 82dB over 4 sessions with rare min A   Time 2   Period Weeks   Status On-going   SLP SHORT TERM GOAL #2   Title Pt will improve loudness in 5 minutes simple conversation to average 68dB over two sessions   Time 2   Period Weeks   Status On-going          SLP Long Term Goals -  12/20/14 1308    SLP LONG TERM GOAL #1   Title pt will sustain loud /a/ across 5 reps at average 83dB over four sessions   Time 6   Period Weeks   Status On-going   SLP LONG TERM GOAL #2   Title pt will demo average 68dB in 8 minutes simple conversation over two sessions   Time 6   Period Weeks   Status On-going   SLP LONG TERM GOAL #3   Title pt will achieve conversational speech intelligibility of 95% in min noisy environment over three sessions   Time 6   Period Weeks   Status On-going          Plan - 12/20/14 1307    Clinical Impression Statement Pt required mod A usually to reduce rate in order to incr speech intelligibility to 90% with this SLP. Continue skilled ST to maximize intellgibility and verabal expression.   Speech Therapy Frequency 2x / week   Treatment/Interventions SLP instruction and feedback;Compensatory  strategies;Internal/external aids;Patient/family education;Functional tasks;Cueing hierarchy   Potential to Achieve Goals Fair   Potential Considerations Severity of impairments;Previous level of function        Problem List Patient Active Problem List   Diagnosis Date Noted  . Amaurosis fugax 08/16/2014  . Cerebral infarction due to embolism of left middle cerebral artery ([place]) 06/22/2014  . Discoloration of skin of foot 03/15/2014  . PD (Parkinson's disease) ([place]) 03/15/2014  . Cerebral infarction due to embolism of cerebral artery ([place]) 03/15/2014  . Essential hypertension 03/15/2014  . HLD (hyperlipidemia) 03/15/2014  . Cough   . LV (left ventricular) mural thrombus ([place])   . CVA (cerebral infarction) 01/18/2014  . Imbalance 01/18/2014  . Slurring of speech   . Abnormality of gait 03/12/2012  . Paralysis agitans ([place]) 03/12/2012  . Acute cholecystitis 02/04/2012  . Hypertension 02/04/2012  . Hyperlipidemia 02/04/2012    Fredric Slabach, Annye Rusk MS, CCC-SLP 12/20/2014, 1:09 PM  [facility] [address] [place], [place], [address] Phone: [phone]   Fax:  [phone]   Name: Kyle Cameron MRN: [id number] Date of Birth: Nov 21, 1926

## 2014-12-21 NOTE — Therapy (Signed)
[facility] [address] [place], [place], [address] Phone: [phone]   Fax:  [phone]  Physical Therapy Treatment  Patient Details  Name: Kyle Cameron MRN: [id number] Date of Birth: 12-01-1926 Referring Provider: Lenor Coffin  Encounter Date: 12/20/2014      PT End of Session - 12/21/14 1011    Visit Number 6   Number of Visits 17   Date for PT Re-Evaluation 01/15/15   Authorization Type Medicare G-code every 10th visit   PT Start Time 1103   PT Stop Time 1144   PT Time Calculation (min) 41 min   Equipment Utilized During Treatment Gait belt   Activity Tolerance Patient tolerated treatment well   Behavior During Therapy [facility] for tasks assessed/performed      Past Medical History  Diagnosis Date  . Hypertension   . High cholesterol   . Prostate atrophy   . GERD (gastroesophageal reflux disease)   . History of diverticulitis of colon   . Benign enlargement of prostate   . Hypothyroidism   . History of renal calculi   . Parkinson's disease ([place])   . Renal calculi   . CVA (cerebral infarction)   . Stroke ([place])   . Anticoagulated on Coumadin   . Headache   . Labyrinthitis   . Grover's disease   . Cataracts, bilateral   . Parkinson's disease ([place])   . Amaurosis fugax 08/16/2014    Right eye    Past Surgical History  Procedure Laterality Date  . Cholecystectomy  02/04/2012    Procedure: LAPAROSCOPIC CHOLECYSTECTOMY WITH INTRAOPERATIVE CHOLANGIOGRAM;  Surgeon: Merrie Roof, MD;  Location: [facility];  Service: General;  Laterality: N/A;  . Lithotripsy      renal calculi  . Tonsillectomy    . Cataract extraction, bilateral      There were no vitals filed for this visit.  Visit Diagnosis:  Rigidity  Bradykinesia  Festinating gait  Abnormality of gait      Subjective Assessment - 12/20/14 1111    Subjective Pt fell this past weekend when trying to get up from bed.  Just got the  U-step RW today.  Denies being hurt from most recent fall.  Used Apokyn shot yesterday morning.   Currently in Pain? No/denies                         [facility] Adult PT Treatment/Exercise - 12/20/14 1120    Transfers   Transfers Sit to Stand;Stand to Sit   Sit to Stand 5: Supervision   Sit to Stand Details (indicate cue type and reason) slow, controlled transfers compared to previous sessions   Five time sit to stand comments  19.30 sec no posterior lean   Stand to Sit 5: Supervision   Comments Practiced sit<>stand with turning to sit at U-step walker or using walker to maneuver into position for sitting.  Pt requires min guard assistance and verbal cues.   Ambulation/Gait   Ambulation/Gait Yes   Ambulation/Gait Assistance 4: Min guard;5: Supervision   Ambulation/Gait Assistance Details Pt has obtained his own personal U-step RW and brings to therapy for use today.   Ambulation Distance (Feet) 480 Feet  then 120 ft x 2   Assistive device Other (Comment)  U-step RW   Gait Pattern Narrow base of support;Poor foot clearance - left;Poor foot clearance - right;Festinating  festination during turns   Ambulation Surface Level;Indoor   Teaching laboratory technician  in place, lateral weigthshifting, stagger stance weightshifting at U-step RW for practicing means of reducing freezing with gait and turns   Timed Up and Go Test   Normal TUG (seconds) 51.02  3.5 seconds to initiate gait         Self Care:  Discussed aspects of pt's new U-step RW, safety with use of walker in the home.  Discussed safety and use of U-step walker for positioning for sitting.       PT Education - 12/21/14 1010    Education provided Yes   Education Details Reviewed tips to reduce freezing; went over aspects of patient's new U-step RW   Person(s) Educated Patient   Methods Explanation;Demonstration   Comprehension Verbalized understanding;Verbal cues required          PT Short Term Goals -  12/21/14 1004    PT SHORT TERM GOAL #1   Title Pt will perform HEP with family/caregiver supervision for improved transfers, balance and gait.  TARGET 12/16/14   Time 4   Period Weeks   Status New   PT SHORT TERM GOAL #2   Title Pt will improve 5x sit<>stand to less than or equal to 15 seconds with no posterior lean for improved transfer efficiency and safety.   Baseline 19.3 sec 12/20/14, but with improved forward lean   Time 4   Period Weeks   Status Not Met   PT SHORT TERM GOAL #3   Title Pt will verbalize/demonstrate understanding of techniques to reduce freezing episodes with gait.   Baseline Pt able to verbalize, but not consistently able to demo.   Time 4   Period Weeks   Status Partially Met   PT SHORT TERM GOAL #4   Title Pt will be able to initiate gait within less than 30 seconds upon standing    Baseline Using U-step RW, pt able to initiate gait in 3.5 sec   Time 4   Period Weeks   Status Achieved           PT Long Term Goals - 11/16/14 1335    PT LONG TERM GOAL #1   Title Pt will verbalize/demonstrate understanding of fall prevention techniques within the home environment.  (TARGET 01/15/15)   Time 8   Period Weeks   Status New   PT LONG TERM GOAL #2   Title Pt will improve gait velocity to at least 2.3 ft/sec for improved efficiency and safety with gait.   Time 8   Period Weeks   Status New   PT LONG TERM GOAL #3   Title Pt will be able to complete TUG test in less than 2 minutes, to demonstrate improved short distance functional mobility within the home.   Time 8   Period Weeks   Status New   PT LONG TERM GOAL #4   Title Further assessment for assistive device to be completed for additional safety within home and community.   Time 8   Period Weeks   Status New   PT LONG TERM GOAL #5   Title Pt will verbalize plans for continued community fitness upon D/C from PT.   Time 8   Period Weeks   Status New               Plan - 12/21/14 1012     Clinical Impression Statement Pt has not met STG #2, #3 partially met, #4 met.  Pt continues to be experiencing freezing, especially with turns.  U-step RW helps with  gait initiation, but pt continues to have difficulty with turns.  Pt feels and appears more steady with U-step RW.  Encouraged patient to be using this in his home.   Pt will benefit from skilled therapeutic intervention in order to improve on the following deficits Abnormal gait;Decreased activity tolerance;Decreased balance;Decreased mobility;Decreased strength;Difficulty walking;Postural dysfunction   Rehab Potential Good   PT Frequency 2x / week   PT Duration 8 weeks  plus eval   PT Treatment/Interventions ADLs/Self Care Home Management;Therapeutic exercise;Therapeutic activities;Functional mobility training;Gait training;DME Instruction;Balance training;Neuromuscular re-education;Patient/family education   PT Next Visit Plan Review exercises again, with focus on weightshifting, stepping initiation, turning practice with U-step RW as his UE support   Consulted and Agree with Plan of Care Patient        Problem List Patient Active Problem List   Diagnosis Date Noted  . Amaurosis fugax 08/16/2014  . Cerebral infarction due to embolism of left middle cerebral artery ([place]) 06/22/2014  . Discoloration of skin of foot 03/15/2014  . PD (Parkinson's disease) ([place]) 03/15/2014  . Cerebral infarction due to embolism of cerebral artery ([place]) 03/15/2014  . Essential hypertension 03/15/2014  . HLD (hyperlipidemia) 03/15/2014  . Cough   . LV (left ventricular) mural thrombus ([place])   . CVA (cerebral infarction) 01/18/2014  . Imbalance 01/18/2014  . Slurring of speech   . Abnormality of gait 03/12/2012  . Paralysis agitans ([place]) 03/12/2012  . Acute cholecystitis 02/04/2012  . Hypertension 02/04/2012  . Hyperlipidemia 02/04/2012    Nechuma Boven W. 12/21/2014, 12:33 PM  Frazier Butt., PT  [facility] [address] [place], [place], [address] Phone: [phone]   Fax:  [phone]  Name: Kyle Cameron MRN: [id number] Date of Birth: [birth year]

## 2014-12-22 ENCOUNTER — Ambulatory Visit (INDEPENDENT_AMBULATORY_CARE_PROVIDER_SITE_OTHER): Payer: Medicare Other | Admitting: Neurology

## 2014-12-22 ENCOUNTER — Ambulatory Visit: Payer: Medicare Other | Admitting: Speech Pathology

## 2014-12-22 ENCOUNTER — Encounter: Payer: Self-pay | Admitting: Neurology

## 2014-12-22 ENCOUNTER — Ambulatory Visit: Payer: Medicare Other | Admitting: Occupational Therapy

## 2014-12-22 ENCOUNTER — Ambulatory Visit: Payer: Medicare Other | Attending: Neurology | Admitting: Physical Therapy

## 2014-12-22 VITALS — BP 98/62 | HR 80 | Ht 67.0 in | Wt 141.6 lb

## 2014-12-22 DIAGNOSIS — I6340 Cerebral infarction due to embolism of unspecified cerebral artery: Secondary | ICD-10-CM | POA: Diagnosis not present

## 2014-12-22 DIAGNOSIS — E785 Hyperlipidemia, unspecified: Secondary | ICD-10-CM

## 2014-12-22 DIAGNOSIS — Z7409 Other reduced mobility: Secondary | ICD-10-CM | POA: Diagnosis present

## 2014-12-22 DIAGNOSIS — I213 ST elevation (STEMI) myocardial infarction of unspecified site: Secondary | ICD-10-CM

## 2014-12-22 DIAGNOSIS — R269 Unspecified abnormalities of gait and mobility: Secondary | ICD-10-CM | POA: Diagnosis not present

## 2014-12-22 DIAGNOSIS — M6281 Muscle weakness (generalized): Secondary | ICD-10-CM | POA: Insufficient documentation

## 2014-12-22 DIAGNOSIS — R279 Unspecified lack of coordination: Secondary | ICD-10-CM | POA: Insufficient documentation

## 2014-12-22 DIAGNOSIS — R2681 Unsteadiness on feet: Secondary | ICD-10-CM | POA: Diagnosis present

## 2014-12-22 DIAGNOSIS — R6889 Other general symptoms and signs: Secondary | ICD-10-CM | POA: Insufficient documentation

## 2014-12-22 DIAGNOSIS — R2689 Other abnormalities of gait and mobility: Secondary | ICD-10-CM | POA: Insufficient documentation

## 2014-12-22 DIAGNOSIS — I513 Intracardiac thrombosis, not elsewhere classified: Secondary | ICD-10-CM

## 2014-12-22 DIAGNOSIS — R258 Other abnormal involuntary movements: Secondary | ICD-10-CM | POA: Insufficient documentation

## 2014-12-22 DIAGNOSIS — I10 Essential (primary) hypertension: Secondary | ICD-10-CM

## 2014-12-22 DIAGNOSIS — R29898 Other symptoms and signs involving the musculoskeletal system: Secondary | ICD-10-CM | POA: Diagnosis present

## 2014-12-22 DIAGNOSIS — R4701 Aphasia: Secondary | ICD-10-CM | POA: Insufficient documentation

## 2014-12-22 DIAGNOSIS — R490 Dysphonia: Secondary | ICD-10-CM | POA: Diagnosis present

## 2014-12-22 DIAGNOSIS — R4189 Other symptoms and signs involving cognitive functions and awareness: Secondary | ICD-10-CM | POA: Diagnosis present

## 2014-12-22 NOTE — Therapy (Signed)
[facility] [address] [place], [place], [address] Phone: [phone]   Fax:  [phone]  Physical Therapy Treatment  Patient Details  Name: Kyle Cameron MRN: [id number] Date of Birth: [birth year] Referring Provider: Lenor Coffin  Encounter Date: 12/22/2014      PT End of Session - 12/22/14 1240    Visit Number 7   Number of Visits 17   Date for PT Re-Evaluation 01/15/15   Authorization Type Medicare G-code every 10th visit   PT Start Time 1019   PT Stop Time 1100   PT Time Calculation (min) 41 min   Equipment Utilized During Treatment Gait belt   Activity Tolerance Patient tolerated treatment well   Behavior During Therapy [facility] for tasks assessed/performed      Past Medical History  Diagnosis Date  . Hypertension   . High cholesterol   . Prostate atrophy   . GERD (gastroesophageal reflux disease)   . History of diverticulitis of colon   . Benign enlargement of prostate   . Hypothyroidism   . History of renal calculi   . Parkinson's disease ([place])   . Renal calculi   . CVA (cerebral infarction)   . Stroke ([place])   . Anticoagulated on Coumadin   . Headache   . Labyrinthitis   . Grover's disease   . Cataracts, bilateral   . Parkinson's disease ([place])   . Amaurosis fugax 08/16/2014    Right eye    Past Surgical History  Procedure Laterality Date  . Cholecystectomy  02/04/2012    Procedure: LAPAROSCOPIC CHOLECYSTECTOMY WITH INTRAOPERATIVE CHOLANGIOGRAM;  Surgeon: Merrie Roof, MD;  Location: [facility];  Service: General;  Laterality: N/A;  . Lithotripsy      renal calculi  . Tonsillectomy    . Cataract extraction, bilateral      There were no vitals filed for this visit.  Visit Diagnosis:  Abnormality of gait      Subjective Assessment - 12/22/14 1023    Subjective Denies falls since last visit.   Pertinent History CVA 12/26/13 and 01/18/14, Parkinson's disease   Patient Stated Goals  Pt's goal for therapy is to have therapy in conjunction with Apomorphine to walk better.   Currently in Pain? No/denies           [facility] Adult PT Treatment/Exercise - 12/22/14 0001    Transfers   Transfers Sit to Stand;Stand to Sit   Sit to Stand 5: Supervision   Stand to Sit 5: Supervision   Comments Repeated x 10 working on decreasing speed   Ambulation/Gait   Ambulation/Gait Yes   Ambulation/Gait Assistance 4: Min guard;5: Supervision   Ambulation Distance (Feet) 600 Feet  plus multiple episodes of weaving in and out cones   Assistive device Other (Comment)  Ustep   Gait Pattern Narrow base of support;Poor foot clearance - left;Poor foot clearance - right;Festinating   Ambulation Surface Level;Indoor   Pre-Gait Activities Marching in place, staggered stance weight shifting, forward, backward and forward<>backward weight shift and round-robin quarter turns from U-step to chair and back to U-step   Gait Comments Significant increase in freezing today especially with tight spaces and thresholds                PT Education - 12/22/14 1239    Education provided Yes   Education Details Tips to reduce freezing, using theraband or laser with U-step   Person(s) Educated Patient   Methods Explanation;Demonstration   Comprehension Verbalized  understanding          PT Short Term Goals - 12/21/14 1004    PT SHORT TERM GOAL #1   Title Pt will perform HEP with family/caregiver supervision for improved transfers, balance and gait.  TARGET 12/16/14   Time 4   Period Weeks   Status New   PT SHORT TERM GOAL #2   Title Pt will improve 5x sit<>stand to less than or equal to 15 seconds with no posterior lean for improved transfer efficiency and safety.   Baseline 19.3 sec 12/20/14, but with improved forward lean   Time 4   Period Weeks   Status Not Met   PT SHORT TERM GOAL #3   Title Pt will verbalize/demonstrate understanding of techniques to reduce freezing episodes with  gait.   Baseline Pt able to verbalize, but not consistently able to demo.   Time 4   Period Weeks   Status Partially Met   PT SHORT TERM GOAL #4   Title Pt will be able to initiate gait within less than 30 seconds upon standing    Baseline Using U-step RW, pt able to initiate gait in 3.5 sec   Time 4   Period Weeks   Status Achieved           PT Long Term Goals - 11/16/14 1335    PT LONG TERM GOAL #1   Title Pt will verbalize/demonstrate understanding of fall prevention techniques within the home environment.  (TARGET 01/15/15)   Time 8   Period Weeks   Status New   PT LONG TERM GOAL #2   Title Pt will improve gait velocity to at least 2.3 ft/sec for improved efficiency and safety with gait.   Time 8   Period Weeks   Status New   PT LONG TERM GOAL #3   Title Pt will be able to complete TUG test in less than 2 minutes, to demonstrate improved short distance functional mobility within the home.   Time 8   Period Weeks   Status New   PT LONG TERM GOAL #4   Title Further assessment for assistive device to be completed for additional safety within home and community.   Time 8   Period Weeks   Status New   PT LONG TERM GOAL #5   Title Pt will verbalize plans for continued community fitness upon D/C from PT.   Time 8   Period Weeks   Status New               Plan - 12/22/14 1240    Clinical Impression Statement Pt continues with significant freezing episodes, especially in tight spaces, turns and thresholds.  Continue PT per POC.   Pt will benefit from skilled therapeutic intervention in order to improve on the following deficits Abnormal gait;Decreased activity tolerance;Decreased balance;Decreased mobility;Decreased strength;Difficulty walking;Postural dysfunction   Rehab Potential Good   PT Frequency 2x / week   PT Duration 8 weeks  plus eval   PT Treatment/Interventions ADLs/Self Care Home Management;Therapeutic exercise;Therapeutic activities;Functional  mobility training;Gait training;DME Instruction;Balance training;Neuromuscular re-education;Patient/family education   PT Next Visit Plan Continue weight shifting activities and turns with U-step   Consulted and Agree with Plan of Care Patient        Problem List Patient Active Problem List   Diagnosis Date Noted  . Amaurosis fugax 08/16/2014  . Cerebral infarction due to embolism of left middle cerebral artery ([place]) 06/22/2014  . Discoloration of skin of foot 03/15/2014  .  PD (Parkinson's disease) ([place]) 03/15/2014  . Cerebral infarction due to embolism of cerebral artery ([place]) 03/15/2014  . Essential hypertension 03/15/2014  . HLD (hyperlipidemia) 03/15/2014  . Cough   . LV (left ventricular) mural thrombus ([place])   . CVA (cerebral infarction) 01/18/2014  . Imbalance 01/18/2014  . Slurring of speech   . Abnormality of gait 03/12/2012  . Paralysis agitans ([place]) 03/12/2012  . Acute cholecystitis 02/04/2012  . Hypertension 02/04/2012  . Hyperlipidemia 02/04/2012    [name] 12/22/2014, 1:03 PM  [facility] [address] [place], [place], [address] Phone: [phone]   Fax:  [phone]  Name: Kyle Cameron MRN: [id number] Date of Birth: 1926/10/17    [name], [place] 12/22/2014 1:03 PM Phone: [phone] Fax: [phone]

## 2014-12-22 NOTE — Patient Instructions (Addendum)
-   continue coumadin and lipitor for stroke prevention for now. INR goal 2-3.  - follow up with Dr. Jannifer Franklin for abnormal gait and PD treatment. - check BP at home - Follow up with your primary care physician for stroke risk factor modification. Recommend maintain blood pressure goal <130/80, diabetes with hemoglobin A1c goal below 6.5% and lipids with LDL cholesterol goal below 70 mg/dL.  - follow up as needed.

## 2014-12-25 ENCOUNTER — Other Ambulatory Visit: Payer: Self-pay

## 2014-12-25 MED ORDER — APOMORPHINE HCL 10 MG/ML ~~LOC~~ SOLN: 0.2000 mL | SUBCUTANEOUS | Status: DC | PRN

## 2014-12-25 NOTE — Progress Notes (Signed)
STROKE NEUROLOGY FOLLOW UP NOTE  NAME: Keawe Fellner DOB: [birth year]  REASON FOR VISIT: stroke follow up HISTORY FROM: pt and chart  Today we had the pleasure of seeing Eri Schu in follow-up at our Neurology Clinic. Pt was accompanied by no one.   History Summary Izayiah Osborne is an 81 y.o. male with history of HTN, HLD, PD following up with Dr. Jannifer Franklin in our practice taking sinemet and selegiline. He was visiting in [place] around 12/26/2013 when he had acute onset right hemiparesis and expressive aphasia. He went to the emergency room, and was found to have a posterior left frontal stroke. He was found to have a LV apical thrombus by TTE, and was placed on Coumadin therapy. The patient returned to [facility] had repeat 2D echo ordered by PCP and no mural thrombus seen. However, he suffered a recurring left cerebellar stroke while on Coumadin on 01/18/2014 with dysarthria and ataxia. His INR was low at that time at 1.47, he was giving lovenox bridging and continued on coumadin.   Follow up 03/15/14 - the patient has been doing better. The patient is now back home with home health PT/OT/speech. He remains on Coumadin, last check INR was >3 about 2 weeks ago. He still reports some difficulty with word finding and shuffling more with his walking. He has not had any falls. He denies problems with swallowing. He continues to have hypophonia with his Parkinson's disease. He followed with Dr. Jannifer Franklin on 01/24/14 and increased sinemet dose for increasing problem with freezing during walking and with turns.  Today in clinic, he complained that his right foot becomes discolorated for about 2 months. His right leg was swollen before but now subsided, however the color in the right foot is always purple and different from the left. He denies any foot pain, pale, or numbness tingling. However the right toes are cold all the time.  Follow up 06/22/14 - the patient has been doing the same. However, he  complains that his voice become softer, lose balance a lot, has to use cane, more freezing episodes on walking. Continued with PT/OT. Saw Dr. Eugenie Birks for PD and increased sinemet dose to 1.5 Tab tid but no change at all. Afraid of crowd space and afraid of going out due to freezing on initiating of gait. He admits that if he missed sinemet dose, no change of symptoms. BP 114/72 today in clinic. Still on coumadin and INR last check 2.7 as per pt. Right foot discoloration resolved. Doppler right LE negative.   Interval History During the interval time, the patient has been doing well from stroke standpoint. No recurrent stroke like symptoms. He still has gait freezing and was given apomorphine injection. He is still on coumadin and lipitor for stroke prevention. INR stable.   REVIEW OF SYSTEMS: Full 14 system review of systems performed and notable only for those listed below and in HPI above, all others are negative:  Constitutional:   Cardiovascular:  Ear/Nose/Throat:   Skin:  Eyes:   Respiratory:   Gastroitestinal:   Genitourinary:  Hematology/Lymphatic:   Endocrine:  Musculoskeletal:   Allergy/Immunology:   Neurological:  Walking difficulty Psychiatric:  Sleep:   The following represents the patient's updated allergies and side effects list: Allergies  Allergen Reactions  . Ace Inhibitors Other (See Comments)    The neurologically relevant items on the patient's problem list were reviewed on today's visit.  Neurologic Examination  A problem focused neurological exam (12 or more points of the single  system neurologic examination, vital signs counts as 1 point, cranial nerves count for 8 points) was performed.  Blood pressure 98/62, pulse 80, height 5\' 7"  (1.702 m), weight 141 lb 9.6 oz (64.229 kg).  General - Well nourished, well developed, in no apparent distress.  Ophthalmologic - Sharp disc margins OU.  Cardiovascular - Regular rate and rhythm with no murmur.   Mental  Status -  Level of arousal and orientation to time, place, and person were intact. Mild word finding difficulties, however intact naming, repetition, and comprehension. Mild dysarthria.  Cranial Nerves II - XII - II - Visual field intact OU. III, IV, VI - Extraocular movements intact. V - Facial sensation intact bilaterally. VII - Facial movement intact bilaterally.  VIII - Hearing & vestibular intact bilaterally. X - Palate elevates symmetrically, mild dysarthria, soft voice. XI - Chin turning & shoulder shrug intact bilaterally. XII - Tongue protrusion intact.  Motor Strength - The patient's strength was normal in all extremities and pronator drift was absent.  Bulk was normal and fasciculations were absent.   Motor Tone - Muscle tone was assessed at the neck and appendages and was normal. No rigidity.  Reflexes - The patient's reflexes were 1+ in all extremities and he had no pathological reflexes.  Sensory - Light touch, temperature/pinprick were assessed and were normal.    Coordination - The patient had normal movements in the hands with no ataxia or dysmetria. No resting tremor.  Gait and Station - able to rise from a seated position with arms crossed, with some tendency to lean backwards. He can walk without assistive device, has good stride, but he has difficulty with initiation of gait. Tandem gait was not attempted.  Data reviewed: I personally reviewed the images and agree with the radiology interpretations.  Dg Chest 2 View 01/18/2014  No active cardiopulmonary disease.   Ct Head Wo Contrast 01/18/2014  Acute to subacute left posterior frontal/anterior parietal infarct.   Mr Brain Wo Contrast 01/18/2014  1. Acute/subacute infarct in the posterior left frontal lobe is confirmed. There is evidence of cortical laminar necrosis without definite hemorrhage. 2. Acute/subacute infarct within the posterolateral left cerebellum. 3. More acute linear area of  infarction within the anteromedial superior left cerebellum. 4. Other scattered white matter disease is present bilaterally and within the brainstem. 5. Mild sinus disease. 6. The report of a brain MRI 12/27/2013 in [place] does not document any infarcts in the cerebellum, suggesting these are new.   2D echo 01/06/14 - Left ventricle: The cavity size was normal. Systolic function was normal. The estimated ejection fraction was in the range of 55% to 60%. Wall motion was normal; there were no regional wall motion abnormalities. - Mitral valve: There was trivial regurgitation. - Tricuspid valve: There was trivial regurgitation. - Pulmonary arteries: PA peak pressure: 37 mm Hg (S).  Impressions: - The right ventricular systolic pressure was increased consistent with mild pulmonary hypertension.  CUS 09/01/2014 No significant hemodynamic stenosis bilaterally.  Component     Latest Ref Rng 01/18/2014 01/19/2014  Cholesterol     0 - 200 mg/dL  157  Triglycerides     <150 mg/dL  41  HDL     >39 mg/dL  57  Total CHOL/HDL Ratio       2.8  VLDL     0 - 40 mg/dL  8  LDL (calc)     0 - 99 mg/dL  92  Hemoglobin A1C     <5.7 %  6.0 (H)   Mean Plasma Glucose     <117 mg/dL 126 (H)   TSH     0.350 - 4.500 uIU/mL 1.530     Assessment: As you may recall, he is a 81 y.o. Caucasian male with PMH of HTN, HLD and PD was admitted in [place] on 12/29/13 for left frontal stroke and found to have left LV thrombus put on coumadin. However, developed acute left cerebellar stroke on 01/18/14 due to low INR level. His repeat 2D echo on 01/06/14 did not see LV thrombus. He was bridged with lovenox and currently on coumadin with last check INR 2.7. He still has mild word finding difficultes and more freezing on gait initiation. Has been followed with Dr. Jannifer Franklin and increased sinemet dose to 1.5 tab tid but reported no significant effect. Cardiology would like long term anticoagulation for  stroke prevention. I agree on that.  Plan:  - continue coumadin and lipitor for stroke prevention. INR goal 2-3.  - follow up with Dr. Jannifer Franklin for abnormal gait and PD treatment. - check BP at home - Follow up with your primary care physician for stroke risk factor modification. Recommend maintain blood pressure goal <130/80, diabetes with hemoglobin A1c goal below 6.5% and lipids with LDL cholesterol goal below 70 mg/dL.  - follow up as needed.  No orders of the defined types were placed in this encounter.    No orders of the defined types were placed in this encounter.    Patient Instructions  - continue coumadin and lipitor for stroke prevention for now. INR goal 2-3.  - follow up with Dr. Jannifer Franklin for abnormal gait and PD treatment. - check BP at home - Follow up with your primary care physician for stroke risk factor modification. Recommend maintain blood pressure goal <130/80, diabetes with hemoglobin A1c goal below 6.5% and lipids with LDL cholesterol goal below 70 mg/dL.  - follow up as needed.    Rosalin Hawking, MD PhD [facility] [address] [place], [place] [address] [phone]

## 2014-12-27 ENCOUNTER — Encounter: Payer: Self-pay | Admitting: Neurology

## 2014-12-27 ENCOUNTER — Ambulatory Visit (INDEPENDENT_AMBULATORY_CARE_PROVIDER_SITE_OTHER): Payer: Medicare Other | Admitting: Neurology

## 2014-12-27 ENCOUNTER — Encounter: Payer: Self-pay | Admitting: Occupational Therapy

## 2014-12-27 ENCOUNTER — Ambulatory Visit: Payer: Medicare Other

## 2014-12-27 ENCOUNTER — Ambulatory Visit: Payer: Medicare Other | Admitting: Occupational Therapy

## 2014-12-27 ENCOUNTER — Ambulatory Visit: Payer: Medicare Other | Admitting: Physical Therapy

## 2014-12-27 VITALS — BP 104/56 | HR 78 | Resp 16 | Ht 67.0 in | Wt 144.5 lb

## 2014-12-27 DIAGNOSIS — R269 Unspecified abnormalities of gait and mobility: Secondary | ICD-10-CM | POA: Diagnosis not present

## 2014-12-27 DIAGNOSIS — G20A1 Parkinson's disease without dyskinesia, without mention of fluctuations: Secondary | ICD-10-CM | POA: Insufficient documentation

## 2014-12-27 DIAGNOSIS — R490 Dysphonia: Secondary | ICD-10-CM

## 2014-12-27 DIAGNOSIS — I236 Thrombosis of atrium, auricular appendage, and ventricle as current complications following acute myocardial infarction: Secondary | ICD-10-CM

## 2014-12-27 DIAGNOSIS — I6930 Unspecified sequelae of cerebral infarction: Secondary | ICD-10-CM | POA: Insufficient documentation

## 2014-12-27 DIAGNOSIS — E039 Hypothyroidism, unspecified: Secondary | ICD-10-CM | POA: Insufficient documentation

## 2014-12-27 DIAGNOSIS — I6340 Cerebral infarction due to embolism of unspecified cerebral artery: Secondary | ICD-10-CM | POA: Diagnosis not present

## 2014-12-27 DIAGNOSIS — R2689 Other abnormalities of gait and mobility: Secondary | ICD-10-CM

## 2014-12-27 DIAGNOSIS — G20 Parkinson's disease: Secondary | ICD-10-CM | POA: Insufficient documentation

## 2014-12-27 DIAGNOSIS — R4701 Aphasia: Secondary | ICD-10-CM

## 2014-12-27 DIAGNOSIS — K219 Gastro-esophageal reflux disease without esophagitis: Secondary | ICD-10-CM | POA: Insufficient documentation

## 2014-12-27 DIAGNOSIS — Z7409 Other reduced mobility: Secondary | ICD-10-CM

## 2014-12-27 DIAGNOSIS — R6889 Other general symptoms and signs: Secondary | ICD-10-CM

## 2014-12-27 DIAGNOSIS — I10 Essential (primary) hypertension: Secondary | ICD-10-CM | POA: Insufficient documentation

## 2014-12-27 DIAGNOSIS — R29898 Other symptoms and signs involving the musculoskeletal system: Secondary | ICD-10-CM

## 2014-12-27 DIAGNOSIS — R279 Unspecified lack of coordination: Secondary | ICD-10-CM

## 2014-12-27 DIAGNOSIS — M7989 Other specified soft tissue disorders: Secondary | ICD-10-CM | POA: Insufficient documentation

## 2014-12-27 DIAGNOSIS — E782 Mixed hyperlipidemia: Secondary | ICD-10-CM | POA: Insufficient documentation

## 2014-12-27 DIAGNOSIS — I213 ST elevation (STEMI) myocardial infarction of unspecified site: Secondary | ICD-10-CM | POA: Insufficient documentation

## 2014-12-27 MED ORDER — CARBIDOPA-LEVODOPA 25-250 MG PO TABS: 1.0000 | ORAL_TABLET | Freq: Three times a day (TID) | ORAL | Status: DC

## 2014-12-27 NOTE — Patient Instructions (Signed)
Perform your loud "ah" routinely, 5 reps, twice daily, at least 6 days a week.

## 2014-12-27 NOTE — Therapy (Signed)
[facility] [address] [place], [place], [address] Phone: [phone]   Fax:  [phone]  Occupational Therapy Treatment  Patient Details  Name: Kyle Cameron MRN: [id number] Date of Birth: 10-07-1926 Referring Provider: Dr. Jannifer Franklin  Encounter Date: 12/27/2014      OT End of Session - 12/27/14 1115    Visit Number 9   Number of Visits 17   Date for OT Re-Evaluation 01/21/15   Authorization Type 1Medicare, 2AARP, G-code needed   Authorization Time Period kx   Authorization - Visit Number 9   Authorization - Number of Visits 10   OT Start Time 1105   OT Stop Time 1145   OT Time Calculation (min) 40 min   Activity Tolerance Patient tolerated treatment well   Behavior During Therapy [facility] for tasks assessed/performed      Past Medical History  Diagnosis Date  . Hypertension   . High cholesterol   . Prostate atrophy   . GERD (gastroesophageal reflux disease)   . History of diverticulitis of colon   . Benign enlargement of prostate   . Hypothyroidism   . History of renal calculi   . Parkinson's disease ([place])   . Renal calculi   . CVA (cerebral infarction)   . Stroke ([place])   . Anticoagulated on Coumadin   . Headache   . Labyrinthitis   . Grover's disease   . Cataracts, bilateral   . Parkinson's disease ([place])   . Amaurosis fugax 08/16/2014    Right eye    Past Surgical History  Procedure Laterality Date  . Cholecystectomy  02/04/2012    Procedure: LAPAROSCOPIC CHOLECYSTECTOMY WITH INTRAOPERATIVE CHOLANGIOGRAM;  Surgeon: Merrie Roof, MD;  Location: [facility];  Service: General;  Laterality: N/A;  . Lithotripsy      renal calculi  . Tonsillectomy    . Cataract extraction, bilateral      There were no vitals filed for this visit.  Visit Diagnosis:  Hypokinesia  Rigidity  Lack of coordination  Decreased functional mobility and endurance      Subjective Assessment - 12/27/14 1113     Subjective  Pt reports walking has been better with the U-step walker   Pertinent History Pt with PD (diagnosed 3 years ago) with 2 CVAs  Pt reports PD worse after CVA.  Pt reports increased difficulty with speech, R hand, and walking.   Patient Stated Goals improve balance/ walking, use of RUE   Currently in Pain? No/denies                      OT Treatments/Exercises (OP) - 12/27/14 0001    ADLs   Functional Mobility Ambulating with U-step walker curing session with min cueing for freezing strategies.  Instructed/reinforced that pt should keep walker with him when sitting in chair (backing up) to reduce freezing (pt with freezing episode when he did not use U-step)   Cooking Discussed possible difficulties with cooking tasks (stirring, peeling, and freezing).  Pt currently denies difficulty (only fatigue).  Educated pt on possible difficulties to be aware of to prevent future complications with progression.     Writing Practiced writing with focus/cueing to slow down and stop and strech hand/PWR! hand when getting smaller.  Pt with 50-75% legibility in different contexts.  Pt with significant difficulty spelling during self-generated writing.    ADL Comments Began checking STGs and discussing progress.--see goal section.  OT Short Term Goals - 12/27/14 1117    OT SHORT TERM GOAL #1   Title Pt will be independent with updated HEP.--due 12/31/14   Time 4   Period Weeks   Status New   OT SHORT TERM GOAL #2   Title Pt will improve coordination as shown by completing 9-hole peg test in 90 secs or less with dominant R hand.-   Baseline 102 secs   Time 4   Period Weeks   Status On-going  12/27/14  96.72sec   OT SHORT TERM GOAL #3   Title Pt will be able to write at least 3 sentences with at least 50% legibility.   Time 4   Period Weeks   Status Achieved  12/27/14 Met at approx this level.   OT SHORT TERM GOAL #4   Title Pt will demonstrate ability  to perfom RUE elbow extension at -10 for improved functional reach   Baseline baseline -18   Time 4   Period Weeks   Status On-going  12/27/14 -15*   OT SHORT TERM GOAL #5   Title ------------------------------------------------------------------------------------------------------------------------------------------           OT Long Term Goals - 11/23/14 1507    OT LONG TERM GOAL #1   Title Pt will verbalize understanding of adaptive strategies for increased safety and independence with  ADLs/IADLs prn    Status New   OT LONG TERM GOAL #2   Title Pt will improve coordination as shown by completing 9-hole peg test in 60 secs or less with right dominant hand   Status New   OT LONG TERM GOAL #3   Title Pt will demonstrate improved functional reaching/ coordination for ADLs as evidenced by improving box/ blocks score to 26 blocks for RUE.   Status New   OT LONG TERM GOAL #4   Title Pt will demonstrate ability to retrieve lightweight items at 110 shoulder flexion with RUE.   Baseline baseline 105 for RUE   OT LONG TERM GOAL #5   Title Pt will demonstrate ability to perfrom PPT# 4(donning jacket in 25 secs or less without loss of balance)   Time 8   Period Weeks   Status New   OT LONG TERM GOAL #6   Title Pt will perform simple cooking/ home management in standing with supervision without LOB.   Time 8   Period Weeks   Status New               Plan - 12/27/14 1116    Clinical Impression Statement Pt demo improvements with writing legibility with cueing for timing and improved coordination.     Plan **G-code/progress note next visit, check remaining STGs.functional movements with exaggerated slowness   OT Home Exercise Plan Education issued:  PWR! supine (basic 4), PWR! hands, card coordination HEP   Consulted and Agree with Plan of Care Patient        Problem List Patient Active Problem List   Diagnosis Date Noted  . Essential (primary) hypertension 12/27/2014   . Swelling of limb 12/27/2014  . Gastro-esophageal reflux disease without esophagitis 12/27/2014  . 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 Hypothyroidism 12/27/2014  . Mixed hyperlipidemia 12/27/2014  . Parkinson's disease ([place]) 12/27/2014  . Sequelae of cerebral infarction 12/27/2014  . Amaurosis fugax 08/16/2014  . Long term current use of anticoagulant 07/08/2014  . Cerebral infarction due to embolism of left middle cerebral artery ([place]) 06/22/2014  . Discoloration of skin of foot 03/15/2014  .  PD (Parkinson's disease) ([place]) 03/15/2014  . Cerebral infarction due to embolism of cerebral artery ([place]) 03/15/2014  . Essential hypertension 03/15/2014  . HLD (hyperlipidemia) 03/15/2014  . Cough   . LV (left ventricular) mural thrombus ([place])   . CVA (cerebral infarction) 01/18/2014  . Imbalance 01/18/2014  . Slurring of speech   . Abnormality of gait 03/12/2012  . Paralysis agitans ([place]) 03/12/2012  . Acute cholecystitis 02/04/2012  . Hypertension 02/04/2012  . Hyperlipidemia 02/04/2012    [facility] 12/27/2014, 1:02 PM  [facility] [address] [place], [place], [address] Phone: [phone]   Fax:  [phone]  Name: Troy Kanouse MRN: [id number] Date of Birth: [birth year]  Vianne Bulls, OTR/L 12/27/2014 1:02 PM

## 2014-12-27 NOTE — Therapy (Signed)
[facility] [address] [place], [place], [address] Phone: [phone]   Fax:  [phone]  Speech Language Pathology Treatment  Patient Details  Name: Kyle Cameron MRN: [id number] Date of Birth: December 31, 1926 Referring Provider: Jannifer Franklin, Dr. Lala Lund.  Encounter Date: 12/27/2014      End of Session - 12/27/14 1312    Visit Number 7   Number of Visits 17   Date for SLP Re-Evaluation 01/15/15   SLP Start Time 1147   SLP Stop Time  1230   SLP Time Calculation (min) 43 min   Activity Tolerance Patient tolerated treatment well      Past Medical History  Diagnosis Date  . Hypertension   . High cholesterol   . Prostate atrophy   . GERD (gastroesophageal reflux disease)   . History of diverticulitis of colon   . Benign enlargement of prostate   . Hypothyroidism   . History of renal calculi   . Parkinson's disease ([place])   . Renal calculi   . CVA (cerebral infarction)   . Stroke ([place])   . Anticoagulated on Coumadin   . Headache   . Labyrinthitis   . Grover's disease   . Cataracts, bilateral   . Parkinson's disease ([place])   . Amaurosis fugax 08/16/2014    Right eye    Past Surgical History  Procedure Laterality Date  . Cholecystectomy  02/04/2012    Procedure: LAPAROSCOPIC CHOLECYSTECTOMY WITH INTRAOPERATIVE CHOLANGIOGRAM;  Surgeon: Merrie Roof, MD;  Location: [facility];  Service: General;  Laterality: N/A;  . Lithotripsy      renal calculi  . Tonsillectomy    . Cataract extraction, bilateral      There were no vitals filed for this visit.  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             ADULT SLP TREATMENT - 12/27/14 1202    General Information   Behavior/Cognition Alert;Cooperative;Pleasant mood   Treatment Provided   Treatment provided Cognitive-Linquistic   Pain Assessment   Pain Assessment No/denies pain   Cognitive-Linquistic Treatment   Treatment  focused on Dysarthria   Skilled Treatment Loud /a/ used by SLP to recalibrate speech loudness - average 89dB. Pt reports his voice appears stronger with the new med - injection administration. Structured tasks to practice for conversational speech req'd SLP min A rarely. In less structured formal question and answer tasks pt required occasional min A for loudness. Conversationally, pt able to maintain 69dB or higher 75% of the time without cues from SLP. With min-mod cues rarely, pt maintained 69dB 80-85% of the time.    Assessment / Recommendations / Plan   Plan Continue with current plan of care   Progression Toward Goals   Progression toward goals Progressing toward goals            SLP Short Term Goals - 12/27/14 1313    SLP SHORT TERM GOAL #1   Title pt will complete loud /a/ with average 82dB over 4 sessions with rare min A   Status Achieved   SLP SHORT TERM GOAL #2   Title Pt will improve loudness in 5 minutes simple conversation to average 68dB over two sessions   Status Achieved          SLP Long Term Goals - 12/27/14 1314    SLP LONG TERM GOAL #1   Title pt will sustain loud /a/ across 5 reps at average 83dB over four sessions  Time 5   Period Weeks   Status On-going   SLP LONG TERM GOAL #2   Title pt will demo average 68dB in 8 minutes simple conversation over two sessions   Time 5   Period Weeks   Status On-going   SLP LONG TERM GOAL #3   Title pt will achieve conversational speech intelligibility of 95% in min noisy environment over three sessions   Time 5   Period Weeks   Status On-going          Plan - 12/27/14 1313    Clinical Impression Statement Pt required mod A usually to reduce rate in order to incr speech intelligibility to 90% with this SLP. Continue skilled ST to maximize intellgibility and verabal expression.   Speech Therapy Frequency 2x / week   Duration --  6 weeks   Treatment/Interventions SLP instruction and feedback;Compensatory  strategies;Internal/external aids;Patient/family education;Functional tasks;Cueing hierarchy   Potential to Achieve Goals Fair   Potential Considerations Severity of impairments;Previous level of function        Problem List Patient Active Problem List   Diagnosis Date Noted  . Essential (primary) hypertension 12/27/2014  . Swelling of limb 12/27/2014  . Gastro-esophageal reflux disease without esophagitis 12/27/2014  . 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 Hypothyroidism 12/27/2014  . Mixed hyperlipidemia 12/27/2014  . Parkinson's disease ([place]) 12/27/2014  . Sequelae of cerebral infarction 12/27/2014  . Amaurosis fugax 08/16/2014  . Long term current use of anticoagulant 07/08/2014  . Cerebral infarction due to embolism of left middle cerebral artery ([place]) 06/22/2014  . Discoloration of skin of foot 03/15/2014  . PD (Parkinson's disease) ([place]) 03/15/2014  . Cerebral infarction due to embolism of cerebral artery ([place]) 03/15/2014  . Essential hypertension 03/15/2014  . HLD (hyperlipidemia) 03/15/2014  . Cough   . LV (left ventricular) mural thrombus ([place])   . CVA (cerebral infarction) 01/18/2014  . Imbalance 01/18/2014  . Slurring of speech   . Abnormality of gait 03/12/2012  . Paralysis agitans ([place]) 03/12/2012  . Acute cholecystitis 02/04/2012  . Hypertension 02/04/2012  . Hyperlipidemia 02/04/2012    Shena Vinluan , MS, CCC-SLP  12/27/2014, 1:14 PM  [facility] [address] [place], [place], [address] Phone: [phone]   Fax:  [phone]   Name: Kyle Cameron MRN: [id number] Date of Birth: [birth year]

## 2014-12-27 NOTE — Patient Instructions (Signed)
Tips to reduce freezing episodes with standing or walking:  1. Stand tall with your feet wide, so that you can rock and weight shift through your hips. 2. Don't try to fight the freeze: if you begin taking slower, faster, smaller steps, STOP, get your posture tall, and RESET your posture and balance.  Take a deep breath before taking the BIG step to start again. 3. March in place, with high knee stepping, to get started walking again. 4. Use auditory cues:  Count out loud, think of a familiar tune or song or cadence, use pocket metronome, to use rhythm to get started walking again. 5. Use visual cues:  Use a line to step over, use laser pointer line to step over, (using BIG steps) to start walking again. 6. Use visual targets to keep your posture tall (look ahead and focus on an object or target at eye level). 7. As you approach where your destination with walking, count your steps out loud and/or focus on your target with your eyes until you are fully there. 8. Use appropriate assistive device, as advised by your physical therapist to assist with taking longer, consistent steps. 9.     

## 2014-12-27 NOTE — Patient Instructions (Addendum)
   We will increase the Sinemet (carbidopa) to 25/250 taking one tablet three times a day.  Parkinson Disease Parkinson disease is a disorder of the central nervous system, which includes the brain and spinal cord. A person with this disease slowly loses the ability to completely control body movements. Within the brain, there is a group of nerve cells (basal ganglia) that help control movement. The basal ganglia are damaged and do not work properly in a person with Parkinson disease. In addition, the basal ganglia produce and use a brain chemical called dopamine. The dopamine chemical sends messages to other parts of the body to control and coordinate body movements. Dopamine levels are low in a person with Parkinson disease. If the dopamine levels are low, then the body does not receive the correct messages it needs to move normally.  CAUSES  The exact reason why the basal ganglia get damaged is not known. Some medical researchers have thought that infection, genes, environment, and certain medicines may contribute to the cause.  SYMPTOMS   An early symptom of Parkinson disease is often an uncontrolled shaking (tremor) of the hands. The tremor will often disappear when the affected hand is consciously used.  As the disease progresses, walking, talking, getting out of a chair, and new movements become more difficult.  Muscles get stiff and movements become slower.  Balance and coordination become harder.  Depression, trouble swallowing, urinary problems, constipation, and sleep problems can occur.  Later in the disease, memory and thought processes may deteriorate. DIAGNOSIS  There are no specific tests to diagnose Parkinson disease. You may be referred to a neurologist for evaluation. Your caregiver will ask about your medical history, symptoms, and perform a physical exam. Blood tests and imaging tests of your brain may be performed to rule out other diseases. The imaging tests may include an  MRI or a CT scan. TREATMENT  The goal of treatment is to relieve symptoms. Medicines may be prescribed once the symptoms become troublesome. Medicine will not stop the progression of the disease, but medicine can make movement and balance better and help control tremors. Speech and occupational therapy may also be prescribed. Sometimes, surgical treatment of the brain can be done in young people. HOME CARE INSTRUCTIONS  Get regular exercise and rest periods during the day to help prevent exhaustion and depression.  If getting dressed becomes difficult, replace buttons and zippers with Velcro and elastic on your clothing.  Take all medicine as directed by your caregiver.  Install grab bars or railings in your home to prevent falls.  Go to speech or occupational therapy as directed.  Keep all follow-up visits as directed by your caregiver. SEEK MEDICAL CARE IF:  Your symptoms are not controlled with your medicine.  You fall.  You have trouble swallowing or choke on your food. MAKE SURE YOU:  Understand these instructions.  Will watch your condition.  Will get help right away if you are not doing well or get worse.   This information is not intended to replace advice given to you by your health care provider. Make sure you discuss any questions you have with your health care provider.   Document Released: 01/05/2000 Document Revised: 05/04/2012 Document Reviewed: 02/06/2011 Elsevier Interactive Patient Education [employer].

## 2014-12-27 NOTE — Progress Notes (Signed)
Reason for visit: Parkinson's disease  Kyle Cameron is an 81 y.o. male  History of present illness:  Kyle Cameron is an 81 year old right-handed white male with a history of Parkinson's disease. The patient is on Sinemet taking to the 25/100 mg tablets taking 2 tablets 3 times daily. The patient is tolerating this dose well. He has been placed on Apokyn, currently at 0.3 mg injectable dose. He is getting minimal benefit at this level. The patient has not had any falls, he denies any swallowing problems, there are some slight memory issues. The patient is using a cane or a walker for ambulation. He is still having a lot of problems with freezing inside of his house. He does worse in the afternoons that he does in the morning time. He is having to 3 nights a week where he does not sleep well. Taking melatonin resulted in significant drowsiness the next day. He returns for an evaluation.  Past Medical History  Diagnosis Date  . Hypertension   . High cholesterol   . Prostate atrophy   . GERD (gastroesophageal reflux disease)   . History of diverticulitis of colon   . Benign enlargement of prostate   . Hypothyroidism   . History of renal calculi   . Parkinson's disease ([place])   . Renal calculi   . CVA (cerebral infarction)   . Stroke ([place])   . Anticoagulated on Coumadin   . Headache   . Labyrinthitis   . Grover's disease   . Cataracts, bilateral   . Parkinson's disease ([place])   . Amaurosis fugax 08/16/2014    Right eye    Past Surgical History  Procedure Laterality Date  . Cholecystectomy  02/04/2012    Procedure: LAPAROSCOPIC CHOLECYSTECTOMY WITH INTRAOPERATIVE CHOLANGIOGRAM;  Surgeon: Merrie Roof, MD;  Location: [facility];  Service: General;  Laterality: N/A;  . Lithotripsy      renal calculi  . Tonsillectomy    . Cataract extraction, bilateral      Family History  Problem Relation Age of Onset  . Parkinsonism Mother   . Heart disease Father   . Esophageal cancer Sister       Social history:  reports that he has quit smoking. He has never used smokeless tobacco. He reports that he drinks about 4.2 oz of alcohol per week. He reports that he does not use illicit drugs.    Allergies  Allergen Reactions  . Ace Inhibitors Other (See Comments)    Medications:  Prior to Admission medications   Medication Sig Start Date End Date Taking? Authorizing Provider  acetaminophen (TYLENOL) 500 MG tablet Take 500 mg by mouth every 8 (eight) hours as needed for mild pain or moderate pain.   Yes Historical Provider, MD  APOMORPHINE HYDROCHLORIDE 10 MG/ML SOLN Inject 0.2-0.6 mLs (2-6 mg total) into the skin as needed (Max dose of 0.84mL five times daily). 12/25/14  Yes Kathrynn Ducking, MD  atorvastatin (LIPITOR) 20 MG tablet Take 20 mg by mouth daily. 07/20/14  Yes Historical Provider, MD  carbidopa-levodopa (SINEMET IR) 25-100 MG per tablet Take 2 tablets by mouth 3 (three) times daily. 09/30/14  Yes Kathrynn Ducking, MD  carbidopa-levodopa (SINEMET IR) 25-100 MG tablet Take 2 tablets by mouth 3 (three) times daily. 11/30/14  Yes Kathrynn Ducking, MD  cholecalciferol (VITAMIN D) 1000 UNITS tablet Take 1,000 Units by mouth daily.   Yes Historical Provider, MD  dutasteride (AVODART) 0.5 MG capsule Take 0.5 mg by mouth  every other day.   Yes Historical Provider, MD  levothyroxine (SYNTHROID, LEVOTHROID) 50 MCG tablet Take 50 mcg by mouth daily before breakfast.   Yes Historical Provider, MD  losartan-hydrochlorothiazide (HYZAAR) 100-25 MG per tablet Take 1 tablet by mouth daily.   Yes Historical Provider, MD  pramipexole (MIRAPEX) 0.125 MG tablet One tablet three times a day for 2 weeks, then take 2 tablets three times a day Patient taking differently: Take 0.125 mg by mouth 3 (three) times daily.  08/16/14  Yes Kathrynn Ducking, MD  selegiline (ELDEPRYL) 5 MG tablet TAKE ONE TABLET BY MOUTH TWICE DAILY WITH MEALS (TAKE WITH BREAKFAST AND LUNCH) 10/03/14  Yes Kathrynn Ducking, MD   trimethobenzamide (TIGAN) 300 MG capsule Take 1 capsule (300 mg total) by mouth 3 (three) times daily. 10/14/14  Yes Kathrynn Ducking, MD  warfarin (COUMADIN) 5 MG tablet Take 5 mg by mouth daily. 5mg  Mon.-Fri, 7.5mg  Sat & Sun   Yes Historical Provider, MD    ROS:  Out of a complete 14 system review of symptoms, the patient complains only of the following symptoms, and all other reviewed systems are negative.  Gait disorder, freezing Insomnia Mild memory disorder  Blood pressure 104/56, pulse 78, resp. rate 16, height 5\' 7"  (1.702 m), weight 144 lb 8 oz (65.545 kg).  Physical Exam  General: The patient is alert and cooperative at the time of the examination.  Skin: No significant peripheral edema is noted.   Neurologic Exam  Mental status: The patient is alert and oriented x 3 at the time of the examination. The patient has apparent normal recent and remote memory, with an apparently normal attention span and concentration ability.   Cranial nerves: Facial symmetry is present. Speech is dysphonic, not aphasic. Extraocular movements are full. Visual fields are full. Masking of the face is seen.  Motor: The patient has good strength in all 4 extremities.  Sensory examination: Soft touch sensation is symmetric on the face, arms, and legs.  Coordination: The patient has good finger-nose-finger and heel-to-shin bilaterally.  Gait and station: The patient has some difficulty arising from a seated position, he needs to use his arms to push off. Once up, he will freeze with initiation walking, once he begins to walk, he has good stride, but he has freezing with turns. Slight decreased arm swing seen. Tandem gait was not attempted. Romberg is negative. No drift is seen.  Reflexes: Deep tendon reflexes are symmetric.   Assessment/Plan:  1. Parkinson's disease  2. Gait disorder, freezing  3. Insomnia  The ability to sleep well is important for his ability to function during the  day. The patient will be increased on Sinemet taking 25/250 mg tablets 3 times daily. The patient is not operating a motor vehicle currently. He will work with the [place], [name], with adjusting the dose of this medication. He will follow-up in 4 months.  Jill Alexanders MD 12/27/2014 7:27 PM  [facility] [address] [place], [place] [address]  Phone [phone] Fax [phone]

## 2014-12-28 ENCOUNTER — Telehealth: Payer: Self-pay

## 2014-12-28 NOTE — Therapy (Signed)
[facility] [address] [place], [place], [address] Phone: [phone]   Fax:  [phone]  Physical Therapy Treatment  Patient Details  Name: Kyle Cameron MRN: [id number] Date of Birth: [birth year] Referring Provider: Lenor Coffin  Encounter Date: 12/27/2014      PT End of Session - 12/28/14 1913    Visit Number 8   Number of Visits 17   Date for PT Re-Evaluation 01/15/15   Authorization Type Medicare G-code every 10th visit   PT Start Time 1020   PT Stop Time 1100   PT Time Calculation (min) 40 min   Equipment Utilized During Treatment Gait belt   Activity Tolerance Patient tolerated treatment well   Behavior During Therapy [facility] for tasks assessed/performed      Past Medical History  Diagnosis Date  . Hypertension   . High cholesterol   . Prostate atrophy   . GERD (gastroesophageal reflux disease)   . History of diverticulitis of colon   . Benign enlargement of prostate   . Hypothyroidism   . History of renal calculi   . Parkinson's disease ([place])   . Renal calculi   . CVA (cerebral infarction)   . Stroke ([place])   . Anticoagulated on Coumadin   . Headache   . Labyrinthitis   . Grover's disease   . Cataracts, bilateral   . Parkinson's disease ([place])   . Amaurosis fugax 08/16/2014    Right eye    Past Surgical History  Procedure Laterality Date  . Cholecystectomy  02/04/2012    Procedure: LAPAROSCOPIC CHOLECYSTECTOMY WITH INTRAOPERATIVE CHOLANGIOGRAM;  Surgeon: Merrie Roof, MD;  Location: [facility];  Service: General;  Laterality: N/A;  . Lithotripsy      renal calculi  . Tonsillectomy    . Cataract extraction, bilateral      There were no vitals filed for this visit.  Visit Diagnosis:  Abnormality of gait  Festinating gait  Rigidity      Subjective Assessment - 12/28/14 1911    Subjective No falls since last visit; improving walking in therapy with walker and at the gym; still  have difficulty with walking at home-lots of freezing.   Currently in Pain? No/denies           Gait: Gait activities utilizing the handout for tips to reduce freezing strategies with gait.  (See instructions) During session, pt ambulates using U-step RW, with therapist cues for practicing varied strategies in varied situations, including straight hallway gait, turns, narrow spaces, furniture negotiation, and turning to sit, with progressive level of difficulty.  Pt requires minguard>supervision during gait activities.  Pt experiences freezing episodes, needing therapist cues to regain mobility.  Advised patient strongly to have caregiver or family member who is typically at home with patient to come into therapy for education on cueing strategies (pt reports that home gait is far worse due to increased freezing episodes).                      PT Education - 12/28/14 1912    Education provided Yes   Education Details Practiced handout on tips to reduce freezing with gait, while using U-step RW in varied situations during therapy session.  Requested pt ask family member/caregiver/friend to come in for education on tips to reduce freezing.   Person(s) Educated Patient   Methods Explanation;Demonstration   Comprehension Verbalized understanding;Verbal cues required          PT Short  Term Goals - 12/21/14 1004    PT SHORT TERM GOAL #1   Title Pt will perform HEP with family/caregiver supervision for improved transfers, balance and gait.  TARGET 12/16/14   Time 4   Period Weeks   Status New   PT SHORT TERM GOAL #2   Title Pt will improve 5x sit<>stand to less than or equal to 15 seconds with no posterior lean for improved transfer efficiency and safety.   Baseline 19.3 sec 12/20/14, but with improved forward lean   Time 4   Period Weeks   Status Not Met   PT SHORT TERM GOAL #3   Title Pt will verbalize/demonstrate understanding of techniques to reduce freezing  episodes with gait.   Baseline Pt able to verbalize, but not consistently able to demo.   Time 4   Period Weeks   Status Partially Met   PT SHORT TERM GOAL #4   Title Pt will be able to initiate gait within less than 30 seconds upon standing    Baseline Using U-step RW, pt able to initiate gait in 3.5 sec   Time 4   Period Weeks   Status Achieved           PT Long Term Goals - 11/16/14 1335    PT LONG TERM GOAL #1   Title Pt will verbalize/demonstrate understanding of fall prevention techniques within the home environment.  (TARGET 01/15/15)   Time 8   Period Weeks   Status New   PT LONG TERM GOAL #2   Title Pt will improve gait velocity to at least 2.3 ft/sec for improved efficiency and safety with gait.   Time 8   Period Weeks   Status New   PT LONG TERM GOAL #3   Title Pt will be able to complete TUG test in less than 2 minutes, to demonstrate improved short distance functional mobility within the home.   Time 8   Period Weeks   Status New   PT LONG TERM GOAL #4   Title Further assessment for assistive device to be completed for additional safety within home and community.   Time 8   Period Weeks   Status New   PT LONG TERM GOAL #5   Title Pt will verbalize plans for continued community fitness upon D/C from PT.   Time 8   Period Weeks   Status New               Plan - 12/28/14 1914    Clinical Impression Statement Pt responds well to varied verbal, visual, tactile cues with reinforcement of education on freezing reduction strategies with gait; however, pt is not independently able reset and get out of freezing episodes without cueing and assistance of U-step RW.  Pt does not seem to respond to auditory cueing with metronome.  Pt will continue to benefit from work on [place] and cueing for improved safety with gait.   Pt will benefit from skilled therapeutic intervention in order to improve on the following deficits Abnormal gait;Decreased activity  tolerance;Decreased balance;Decreased mobility;Decreased strength;Difficulty walking;Postural dysfunction   Rehab Potential Good   PT Frequency 2x / week   PT Duration 8 weeks  plus eval   PT Treatment/Interventions ADLs/Self Care Home Management;Therapeutic exercise;Therapeutic activities;Functional mobility training;Gait training;DME Instruction;Balance training;Neuromuscular re-education;Patient/family education   PT Next Visit Plan Family/caregiver education (if present) in using of U-step RW and strategies to reduce freezing with gait.   Consulted and Agree with Plan of Care Patient  Problem List Patient Active Problem List   Diagnosis Date Noted  . Essential (primary) hypertension 12/27/2014  . Swelling of limb 12/27/2014  . Gastro-esophageal reflux disease without esophagitis 12/27/2014  . 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 Hypothyroidism 12/27/2014  . Mixed hyperlipidemia 12/27/2014  . Parkinson's disease ([place]) 12/27/2014  . Sequelae of cerebral infarction 12/27/2014  . Amaurosis fugax 08/16/2014  . Long term current use of anticoagulant 07/08/2014  . Cerebral infarction due to embolism of left middle cerebral artery ([place]) 06/22/2014  . Discoloration of skin of foot 03/15/2014  . PD (Parkinson's disease) ([place]) 03/15/2014  . Cerebral infarction due to embolism of cerebral artery ([place]) 03/15/2014  . Essential hypertension 03/15/2014  . HLD (hyperlipidemia) 03/15/2014  . Cough   . LV (left ventricular) mural thrombus ([place])   . CVA (cerebral infarction) 01/18/2014  . Imbalance 01/18/2014  . Slurring of speech   . Abnormality of gait 03/12/2012  . Paralysis agitans ([place]) 03/12/2012  . Acute cholecystitis 02/04/2012  . Hypertension 02/04/2012  . Hyperlipidemia 02/04/2012    Charlea Nardo W. 12/28/2014, 7:19 PM  Frazier Butt., PT  [facility] [address] [place], [place],  [address] Phone: [phone]   Fax:  [phone]  Name: Kyle Cameron MRN: [id number] Date of Birth: 1926/02/18

## 2014-12-28 NOTE — Telephone Encounter (Signed)
Left voicemail stating that I have not received authorization form for walker. I asked that he please have it resent to [phone].

## 2014-12-29 ENCOUNTER — Ambulatory Visit: Payer: Medicare Other

## 2014-12-29 ENCOUNTER — Ambulatory Visit: Payer: Medicare Other | Admitting: Occupational Therapy

## 2014-12-29 ENCOUNTER — Ambulatory Visit: Payer: Medicare Other | Admitting: Physical Therapy

## 2014-12-29 DIAGNOSIS — R29898 Other symptoms and signs involving the musculoskeletal system: Secondary | ICD-10-CM

## 2014-12-29 DIAGNOSIS — R6889 Other general symptoms and signs: Secondary | ICD-10-CM

## 2014-12-29 DIAGNOSIS — R269 Unspecified abnormalities of gait and mobility: Secondary | ICD-10-CM

## 2014-12-29 DIAGNOSIS — Z7409 Other reduced mobility: Secondary | ICD-10-CM

## 2014-12-29 DIAGNOSIS — R4701 Aphasia: Secondary | ICD-10-CM

## 2014-12-29 DIAGNOSIS — R490 Dysphonia: Secondary | ICD-10-CM

## 2014-12-29 DIAGNOSIS — R279 Unspecified lack of coordination: Secondary | ICD-10-CM

## 2014-12-29 NOTE — Therapy (Signed)
[facility] [address] [place], [place], [address] Phone: [phone]   Fax:  [phone]  Speech Language Pathology Treatment  Patient Details  Name: Kyle Cameron MRN: [id number] Date of Birth: 1926/02/11 Referring Provider: Jannifer Franklin, Dr. Lala Lund.  Encounter Date: 12/29/2014      End of Session - 12/29/14 1528    Visit Number 8   Number of Visits 17   Date for SLP Re-Evaluation 01/15/15   SLP Start Time 1448   SLP Stop Time  [id number]   SLP Time Calculation (min) 42 min   Activity Tolerance Patient tolerated treatment well      Past Medical History  Diagnosis Date  . Hypertension   . High cholesterol   . Prostate atrophy   . GERD (gastroesophageal reflux disease)   . History of diverticulitis of colon   . Benign enlargement of prostate   . Hypothyroidism   . History of renal calculi   . Parkinson's disease ([place])   . Renal calculi   . CVA (cerebral infarction)   . Stroke ([place])   . Anticoagulated on Coumadin   . Headache   . Labyrinthitis   . Grover's disease   . Cataracts, bilateral   . Parkinson's disease ([place])   . Amaurosis fugax 08/16/2014    Right eye    Past Surgical History  Procedure Laterality Date  . Cholecystectomy  02/04/2012    Procedure: LAPAROSCOPIC CHOLECYSTECTOMY WITH INTRAOPERATIVE CHOLANGIOGRAM;  Surgeon: Merrie Roof, MD;  Location: [facility];  Service: General;  Laterality: N/A;  . Lithotripsy      renal calculi  . Tonsillectomy    . Cataract extraction, bilateral      There were no vitals filed for this visit.  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      Subjective Assessment - 12/29/14 1503    Subjective Pt tells SLP thst "It isn't a good day." Pt attributes it to doing too much yesterday (5 hours in the kitchen making meatballs and sausage."               ADULT SLP TREATMENT - 12/29/14 1507    General Information    Behavior/Cognition Alert;Cooperative;Pleasant mood   Treatment Provided   Treatment provided Cognitive-Linquistic   Pain Assessment   Pain Assessment No/denies pain   Cognitive-Linquistic Treatment   Treatment focused on Dysarthria   Skilled Treatment Loud /a/ performed today to give pt objective loudness to remember while in conversation. Average 84dB with consistent SLP min-mod A for full breath and consistent mod A for maintaining loudness. Min complex sentence verbal tasks, SLP facilitated speech from pt measuring average 68dB with usual min-mod cues for loudness/incr'd effort. Simple conversation maintained with usual min A at average 68dB. In verbal sequencing tasks (multisentence), SLP used min A occasionally to improve pt's loudness to average 69dB.   Assessment / Recommendations / Plan   Plan Continue with current plan of care   Progression Toward Goals   Progression toward goals --  Pt with good motivation to work towards goals            SLP Short Term Goals - 12/27/14 [place] #1   Title pt will complete loud /a/ with average 82dB over 4 sessions with rare min A   Status Achieved   SLP SHORT TERM GOAL #2   Title Pt will improve loudness in 5 minutes simple conversation to average 68dB over two  sessions   Status Achieved          SLP Long Term Goals - 12/29/14 1529    SLP LONG TERM GOAL #1   Title pt will sustain loud /a/ across 5 reps at average 83dB over four sessions   Time 5   Period Weeks   Status On-going   SLP LONG TERM GOAL #2   Title pt will demo average 68dB in 8 minutes simple conversation over two sessions   Time 5   Period Weeks   Status On-going   SLP LONG TERM GOAL #3   Title pt will achieve conversational speech intelligibility of 95% in min noisy environment over three sessions   Time 5   Period Weeks   Status On-going          Plan - 12/29/14 1528    Clinical Impression Statement Pt required min-mod A usually to  incr loudness/effort and thus speech intelligibility. Continue skilled ST to maximize intellgibility and verabal expression.   Speech Therapy Frequency 2x / week   Duration --  5 weeks   Treatment/Interventions SLP instruction and feedback;Compensatory strategies;Internal/external aids;Patient/family education;Functional tasks;Cueing hierarchy   Potential to Achieve Goals Fair   Potential Considerations Severity of impairments;Previous level of function        Problem List Patient Active Problem List   Diagnosis Date Noted  . Essential (primary) hypertension 12/27/2014  . Swelling of limb 12/27/2014  . Gastro-esophageal reflux disease without esophagitis 12/27/2014  . 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 Hypothyroidism 12/27/2014  . Mixed hyperlipidemia 12/27/2014  . Parkinson's disease ([place]) 12/27/2014  . Sequelae of cerebral infarction 12/27/2014  . Amaurosis fugax 08/16/2014  . Long term current use of anticoagulant 07/08/2014  . Cerebral infarction due to embolism of left middle cerebral artery ([place]) 06/22/2014  . Discoloration of skin of foot 03/15/2014  . PD (Parkinson's disease) ([place]) 03/15/2014  . Cerebral infarction due to embolism of cerebral artery ([place]) 03/15/2014  . Essential hypertension 03/15/2014  . HLD (hyperlipidemia) 03/15/2014  . Cough   . LV (left ventricular) mural thrombus ([place])   . CVA (cerebral infarction) 01/18/2014  . Imbalance 01/18/2014  . Slurring of speech   . Abnormality of gait 03/12/2012  . Paralysis agitans ([place]) 03/12/2012  . Acute cholecystitis 02/04/2012  . Hypertension 02/04/2012  . Hyperlipidemia 02/04/2012    SCHINKE,CARL , [place], CCC-SLP   12/29/2014, 3:30 PM  [facility] [address] [place], [place], [address] Phone: [phone]   Fax:  [phone]   Name: Kyle Cameron MRN: [id number] Date of Birth: 12-11-1926

## 2014-12-29 NOTE — Therapy (Signed)
[facility] [address] [place], [place], [address] Phone: [phone]   Fax:  [phone]  Physical Therapy Treatment  Patient Details  Name: Kyle Cameron MRN: [id number] Date of Birth: 02/06/1926 Referring Provider: Lenor Coffin  Encounter Date: 12/29/2014      PT End of Session - 12/29/14 2300    Visit Number 9   Number of Visits 17   Date for PT Re-Evaluation 01/15/15   Authorization Type Medicare G-code every 10th visit   PT Start Time 1317   PT Stop Time 1400   PT Time Calculation (min) 43 min   Equipment Utilized During Treatment Gait belt   Activity Tolerance Patient tolerated treatment well   Behavior During Therapy [facility] for tasks assessed/performed      Past Medical History  Diagnosis Date  . Hypertension   . High cholesterol   . Prostate atrophy   . GERD (gastroesophageal reflux disease)   . History of diverticulitis of colon   . Benign enlargement of prostate   . Hypothyroidism   . History of renal calculi   . Parkinson's disease ([place])   . Renal calculi   . CVA (cerebral infarction)   . Stroke ([place])   . Anticoagulated on Coumadin   . Headache   . Labyrinthitis   . Grover's disease   . Cataracts, bilateral   . Parkinson's disease ([place])   . Amaurosis fugax 08/16/2014    Right eye    Past Surgical History  Procedure Laterality Date  . Cholecystectomy  02/04/2012    Procedure: LAPAROSCOPIC CHOLECYSTECTOMY WITH INTRAOPERATIVE CHOLANGIOGRAM;  Surgeon: Merrie Roof, MD;  Location: [facility];  Service: General;  Laterality: N/A;  . Lithotripsy      renal calculi  . Tonsillectomy    . Cataract extraction, bilateral      There were no vitals filed for this visit.  Visit Diagnosis:  Abnormality of gait      Subjective Assessment - 12/29/14 1322    Subjective Reports having a busy morning and having significant freezing.  Unable to initiate gait in lobby and had to push patient  back seated on U-step.   Pertinent History CVA 12/26/13 and 01/18/14, Parkinson's disease   Patient Stated Goals Pt's goal for therapy is to have therapy in conjunction with Apomorphine to walk better.   Currently in Pain? No/denies             Gait with patient's U-step (without laser pointer) and clinics U-step with laser pointer.  Pt with significant freezing episodes both with and without laser but slightly improved with use of laser.  Worked on turns, tight spaces, transitions/thresholds and crowded areas.  Pt able to recall and initiate tips to reduce freezing but these tips did not significantly help with freezing today.  Standing at counter for forward and backward weight shifting as well as stepping over hurdles-all with 1 UE assist.  Pt with difficulty with SLS and unable to perform without UE assist. Worked on quarter turns/round robin at counter with UE assist with moderate cues.        PT Education - 12/29/14 2259    Education provided Yes   Education Details Ways to reduce freezing, laser on U-step   Person(s) Educated Patient   Methods Explanation;Demonstration   Comprehension Verbalized understanding;Returned demonstration          PT Short Term Goals - 12/21/14 1004    PT SHORT TERM GOAL #1   Title  Pt will perform HEP with family/caregiver supervision for improved transfers, balance and gait.  TARGET 12/16/14   Time 4   Period Weeks   Status New   PT SHORT TERM GOAL #2   Title Pt will improve 5x sit<>stand to less than or equal to 15 seconds with no posterior lean for improved transfer efficiency and safety.   Baseline 19.3 sec 12/20/14, but with improved forward lean   Time 4   Period Weeks   Status Not Met   PT SHORT TERM GOAL #3   Title Pt will verbalize/demonstrate understanding of techniques to reduce freezing episodes with gait.   Baseline Pt able to verbalize, but not consistently able to demo.   Time 4   Period Weeks   Status Partially Met    PT SHORT TERM GOAL #4   Title Pt will be able to initiate gait within less than 30 seconds upon standing    Baseline Using U-step RW, pt able to initiate gait in 3.5 sec   Time 4   Period Weeks   Status Achieved           PT Long Term Goals - 11/16/14 1335    PT LONG TERM GOAL #1   Title Pt will verbalize/demonstrate understanding of fall prevention techniques within the home environment.  (TARGET 01/15/15)   Time 8   Period Weeks   Status New   PT LONG TERM GOAL #2   Title Pt will improve gait velocity to at least 2.3 ft/sec for improved efficiency and safety with gait.   Time 8   Period Weeks   Status New   PT LONG TERM GOAL #3   Title Pt will be able to complete TUG test in less than 2 minutes, to demonstrate improved short distance functional mobility within the home.   Time 8   Period Weeks   Status New   PT LONG TERM GOAL #4   Title Further assessment for assistive device to be completed for additional safety within home and community.   Time 8   Period Weeks   Status New   PT LONG TERM GOAL #5   Title Pt will verbalize plans for continued community fitness upon D/C from PT.   Time 8   Period Weeks   Status New               Plan - 12/29/14 2300    Clinical Impression Statement Pt with significant freezing today during session.  Did improve at times with use of laser line on clinics U-step walker but pt not sure if he thinks it helps enough to purchase for his personal U-step.  Pt has not been using injectible medication for freezing.  Able to verbalize ways to reduce freezing but these techniques did not significantly benefit patient today.  Continue PT per POC.   Pt will benefit from skilled therapeutic intervention in order to improve on the following deficits Abnormal gait;Decreased activity tolerance;Decreased balance;Decreased mobility;Decreased strength;Difficulty walking;Postural dysfunction   Rehab Potential Good   PT Frequency 2x / week   PT  Duration 8 weeks  plus eval   PT Treatment/Interventions ADLs/Self Care Home Management;Therapeutic exercise;Therapeutic activities;Functional mobility training;Gait training;DME Instruction;Balance training;Neuromuscular re-education;Patient/family education   PT Next Visit Plan G-code   Consulted and Agree with Plan of Care Patient        Problem List Patient Active Problem List   Diagnosis Date Noted  . Essential (primary) hypertension 12/27/2014  . Swelling of limb 12/27/2014  .  Gastro-esophageal reflux disease without esophagitis 12/27/2014  . 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 Hypothyroidism 12/27/2014  . Mixed hyperlipidemia 12/27/2014  . Parkinson's disease ([place]) 12/27/2014  . Sequelae of cerebral infarction 12/27/2014  . Amaurosis fugax 08/16/2014  . Long term current use of anticoagulant 07/08/2014  . Cerebral infarction due to embolism of left middle cerebral artery ([place]) 06/22/2014  . Discoloration of skin of foot 03/15/2014  . PD (Parkinson's disease) ([place]) 03/15/2014  . Cerebral infarction due to embolism of cerebral artery ([place]) 03/15/2014  . Essential hypertension 03/15/2014  . HLD (hyperlipidemia) 03/15/2014  . Cough   . LV (left ventricular) mural thrombus ([place])   . CVA (cerebral infarction) 01/18/2014  . Imbalance 01/18/2014  . Slurring of speech   . Abnormality of gait 03/12/2012  . Paralysis agitans ([place]) 03/12/2012  . Acute cholecystitis 02/04/2012  . Hypertension 02/04/2012  . Hyperlipidemia 02/04/2012    [name] 12/29/2014, 11:05 PM  [facility] [address] [place], [place], [address] Phone: [phone]   Fax:  [phone]  Name: Kyle Cameron MRN: [id number] Date of Birth: 01-Nov-1926   [name], [place] 12/29/2014 11:06 PM Phone: [phone] Fax: [phone]      .

## 2014-12-29 NOTE — Therapy (Signed)
[facility] [address] [place], [place], [address] Phone: [phone]   Fax:  [phone]  Occupational Therapy Treatment  Patient Details  Name: Kyle Cameron MRN: [id number] Date of Birth: 1926-08-03 Referring Provider: Dr. Jannifer Franklin  Encounter Date: 12/29/2014      OT End of Session - 12/29/14 1412    Visit Number 10   Number of Visits 17   Date for OT Re-Evaluation 01/21/15   Authorization Type 1Medicare, 2[employer]ARP, G-code needed   Authorization Time Period kx   Authorization - Visit Number 10   Authorization - Number of Visits 10   OT Start Time 1407   OT Stop Time 1445   OT Time Calculation (min) 38 min   Activity Tolerance Patient tolerated treatment well   Behavior During Therapy [facility] for tasks assessed/performed      Past Medical History  Diagnosis Date  . Hypertension   . High cholesterol   . Prostate atrophy   . GERD (gastroesophageal reflux disease)   . History of diverticulitis of colon   . Benign enlargement of prostate   . Hypothyroidism   . History of renal calculi   . Parkinson's disease ([place])   . Renal calculi   . CVA (cerebral infarction)   . Stroke ([place])   . Anticoagulated on Coumadin   . Headache   . Labyrinthitis   . Grover's disease   . Cataracts, bilateral   . Parkinson's disease ([place])   . Amaurosis fugax 08/16/2014    Right eye    Past Surgical History  Procedure Laterality Date  . Cholecystectomy  02/04/2012    Procedure: LAPAROSCOPIC CHOLECYSTECTOMY WITH INTRAOPERATIVE CHOLANGIOGRAM;  Surgeon: Merrie Roof, MD;  Location: [facility];  Service: General;  Laterality: N/A;  . Lithotripsy      renal calculi  . Tonsillectomy    . Cataract extraction, bilateral      There were no vitals filed for this visit.  Visit Diagnosis:  Hypokinesia  Rigidity  Lack of coordination  Decreased functional mobility and endurance      Subjective Assessment - 12/29/14 1408    Subjective  Pt reports that he is just tired today, but feels that overall his walking and speech are better.  Pt has not been performing exercises outside of gym   Pertinent History Pt with PD (diagnosed 3 years ago) with 2 CVAs  Pt reports PD worse after CVA.  Pt reports increased difficulty with speech, R hand, and walking.   Patient Stated Goals improve balance/ walking, use of RUE   Currently in Pain? No/denies                      OT Treatments/Exercises (OP) - 12/29/14 0001    ADLs   Functional Mobility min cueing for strategies for freezing during functional mobility during session and walker negotion/placement   ADL Comments Checked remaining STGs and box and blocks test:  see goals section   Fine Motor Coordination   Fine Motor Coordination Dealing card with thumb;Flipping cards   Flipping cards with min-mod cues for big amplitude movements each UE (finger extension/supination)   Dealing card with thumb with min-mod cues for big amplitude movements with each UE (more difficulty/cues with RUE than LUE)           PWR ([facility]) - 12/29/14 1524    PWR! exercises Moves in supine   PWR! Up x10   PWR! Rock x10 to each side  PWR! Twist x10 to each side   PWR! Step x10 to each side   Comments with min-mod cueing for proper performance and big amplitude movements             OT Education - 12/29/14 1530    Education Details Emphasized importance of performing HEP on days that he doesn't go to the gym   Person(s) Educated Patient   Methods Explanation   Comprehension Verbalized understanding          OT Short Term Goals - 12/29/14 1517    OT SHORT TERM GOAL #1   Title Pt will be independent with updated HEP.--due 12/31/14   Time 4   Period Weeks   Status On-going  12/29/14 pt not perfoming at home and needs min-mod cues in clinic   OT [place] #2   Title Pt will improve coordination as shown by completing 9-hole peg test in 90 secs or less with  dominant R hand.-   Baseline 102 secs   Time 4   Period Weeks   Status On-going  12/27/14  96.72sec   OT SHORT TERM GOAL #3   Title Pt will be able to write at least 3 sentences with at least 50% legibility.   Time 4   Period Weeks   Status Achieved  12/27/14 Met at approx this level.   OT SHORT TERM GOAL #4   Title Pt will demonstrate ability to perfom RUE elbow extension at -10 for improved functional reach   Baseline baseline -18   Time 4   Period Weeks   Status On-going  12/27/14 -15*   OT SHORT TERM GOAL #5   Title ------------------------------------------------------------------------------------------------------------------------------------------           OT Long Term Goals - 12/29/14 1513    OT LONG TERM GOAL #1   Title Pt will verbalize understanding of adaptive strategies for increased safety and independence with  ADLs/IADLs prn    Status On-going   OT LONG TERM GOAL #2   Title Pt will improve coordination as shown by completing 9-hole peg test in 60 secs or less with right dominant hand   Status On-going   OT LONG TERM GOAL #3   Title Pt will demonstrate improved functional reaching/ coordination for ADLs as evidenced by improving box/ blocks score to 26 blocks for RUE.   Status Achieved  R-33 blocks 12/29/14   OT LONG TERM GOAL #4   Title Pt will demonstrate ability to retrieve lightweight items at 110 shoulder flexion with RUE.   Baseline baseline 105 for RUE   OT LONG TERM GOAL #5   Title Pt will demonstrate ability to perfrom PPT# 4(donning jacket in 25 secs or less without loss of balance)   Time 8   Period Weeks   Status New   OT LONG TERM GOAL #6   Title Pt will perform simple cooking/ home management in standing with supervision without LOB.   Time 8   Period Weeks   Status New               Plan - 12/29/14 1537    Clinical Impression Statement Pt demo improved functional reaching and coordination per box and blocks test.  Progress  slow due to severity of deficits, particulary with timing deficits/hypokinesia and freezing.  Pt would benefit from continued occupational therapy to improve coordination, ADL performance, and prevent future complications.   Pt will benefit from skilled therapeutic intervention in order to improve on the  following deficits (Retired) Decreased balance;Decreased knowledge of use of DME;Impaired UE functional use;Impaired perceived functional ability;Decreased strength;Decreased mobility;Decreased activity tolerance;Impaired tone;Impaired sensation;Decreased safety awareness;Decreased endurance;Decreased coordination   Rehab Potential Good   Clinical Impairments Affecting Rehab Potential speed and timing.   OT Frequency 2x / week   OT Duration 8 weeks   Plan simple cooking task   OT Home Exercise Plan Education issued:  PWR! supine (basic 4), PWR! hands, card coordination HEP   Consulted and Agree with Plan of Care Patient          G-Codes - 2015/01/14 1516    Functional Assessment Tool Used  9 hole peg test: RUE 96.72 secs, box/ blocks RUE 33 blocks   Functional Limitation Carrying, moving and handling objects   Carrying, Moving and Handling Objects Current Status [phone]) At least 20 percent but less than 40 percent impaired, limited or restricted   Carrying, Moving and Handling Objects Goal Status ([id number]) At least 20 percent but less than 40 percent impaired, limited or restricted      Occupational Therapy Progress Note  Dates of Reporting Period: 11/23/14 to 2015/01/14  Objective Reports of Subjective Statement: see above  Objective Measurements: see above  Goal Update: see above  Plan: see above  Reason Skilled Services are Required: see above   Problem List Patient Active Problem List   Diagnosis Date Noted  . Essential (primary) hypertension 12/27/2014  . Swelling of limb 12/27/2014  . Gastro-esophageal reflux disease without esophagitis 12/27/2014  . 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 Hypothyroidism 12/27/2014  . Mixed hyperlipidemia 12/27/2014  . Parkinson's disease ([place]) 12/27/2014  . Sequelae of cerebral infarction 12/27/2014  . Amaurosis fugax 08/16/2014  . Long term current use of anticoagulant 07/08/2014  . Cerebral infarction due to embolism of left middle cerebral artery ([place]) 06/22/2014  . Discoloration of skin of foot 03/15/2014  . PD (Parkinson's disease) ([place]) 03/15/2014  . Cerebral infarction due to embolism of cerebral artery ([place]) 03/15/2014  . Essential hypertension 03/15/2014  . HLD (hyperlipidemia) 03/15/2014  . Cough   . LV (left ventricular) mural thrombus ([place])   . CVA (cerebral infarction) 01/18/2014  . Imbalance 01/18/2014  . Slurring of speech   . Abnormality of gait 03/12/2012  . Paralysis agitans ([place]) 03/12/2012  . Acute cholecystitis 02/04/2012  . Hypertension 02/04/2012  . Hyperlipidemia 02/04/2012    [facility] January 14, 2015, 3:44 PM  [facility] [address] [place], [place], [address] Phone: [phone]   Fax:  [phone]  Name: Kyle Cameron MRN: [id number] Date of Birth: November 04, 1926  Vianne Bulls, OTR/L 01-14-15 3:44 PM

## 2015-01-03 ENCOUNTER — Ambulatory Visit: Payer: Medicare Other | Admitting: Speech Pathology

## 2015-01-03 ENCOUNTER — Ambulatory Visit: Payer: Medicare Other | Admitting: Physical Therapy

## 2015-01-03 ENCOUNTER — Ambulatory Visit: Payer: Medicare Other | Admitting: Occupational Therapy

## 2015-01-03 DIAGNOSIS — Z7409 Other reduced mobility: Secondary | ICD-10-CM

## 2015-01-03 DIAGNOSIS — R6889 Other general symptoms and signs: Secondary | ICD-10-CM

## 2015-01-03 DIAGNOSIS — R2689 Other abnormalities of gait and mobility: Secondary | ICD-10-CM

## 2015-01-03 DIAGNOSIS — M6281 Muscle weakness (generalized): Secondary | ICD-10-CM

## 2015-01-03 DIAGNOSIS — R269 Unspecified abnormalities of gait and mobility: Secondary | ICD-10-CM

## 2015-01-03 DIAGNOSIS — G20 Parkinson's disease: Secondary | ICD-10-CM

## 2015-01-03 DIAGNOSIS — R29898 Other symptoms and signs involving the musculoskeletal system: Secondary | ICD-10-CM

## 2015-01-03 DIAGNOSIS — R490 Dysphonia: Secondary | ICD-10-CM

## 2015-01-03 DIAGNOSIS — R258 Other abnormal involuntary movements: Secondary | ICD-10-CM

## 2015-01-03 DIAGNOSIS — R4189 Other symptoms and signs involving cognitive functions and awareness: Secondary | ICD-10-CM

## 2015-01-03 DIAGNOSIS — R2681 Unsteadiness on feet: Secondary | ICD-10-CM

## 2015-01-03 DIAGNOSIS — R279 Unspecified lack of coordination: Secondary | ICD-10-CM

## 2015-01-03 NOTE — Therapy (Signed)
[facility] [address] [place], [place], [address] Phone: [phone]   Fax:  [phone]  Occupational Therapy Treatment  Patient Details  Name: Kyle Cameron MRN: [id number] Date of Birth: [birth year] Referring Provider: Dr. Jannifer Franklin  Encounter Date: 01/03/2015      OT End of Session - 01/03/15 1739    Visit Number 11   Number of Visits 17   Date for OT Re-Evaluation 01/21/15   Authorization Type 1Medicare, 2AARP, G-code needed   Authorization Time Period kx   Authorization - Visit Number 11   Authorization - Number of Visits 20   OT Start Time 1148   OT Stop Time 1235   OT Time Calculation (min) 47 min   Activity Tolerance Patient tolerated treatment well   Behavior During Therapy [facility] for tasks assessed/performed      Past Medical History  Diagnosis Date  . Hypertension   . High cholesterol   . Prostate atrophy   . GERD (gastroesophageal reflux disease)   . History of diverticulitis of colon   . Benign enlargement of prostate   . Hypothyroidism   . History of renal calculi   . Parkinson's disease ([place])   . Renal calculi   . CVA (cerebral infarction)   . Stroke ([place])   . Anticoagulated on Coumadin   . Headache   . Labyrinthitis   . Grover's disease   . Cataracts, bilateral   . Parkinson's disease ([place])   . Amaurosis fugax 08/16/2014    Right eye    Past Surgical History  Procedure Laterality Date  . Cholecystectomy  02/04/2012    Procedure: LAPAROSCOPIC CHOLECYSTECTOMY WITH INTRAOPERATIVE CHOLANGIOGRAM;  Surgeon: Merrie Roof, MD;  Location: [facility];  Service: General;  Laterality: N/A;  . Lithotripsy      renal calculi  . Tonsillectomy    . Cataract extraction, bilateral      There were no vitals filed for this visit.  Visit Diagnosis:  Hypokinesia  Rigidity  Lack of coordination  Decreased functional mobility and endurance  Cognitive deficits  Unsteadiness      Subjective  Assessment - 01/03/15 1249    Subjective  Pt reports that he has only been taking shot when he goes to the gym   Pertinent History Pt with PD (diagnosed 3 years ago) with 2 CVAs  Pt reports PD worse after CVA.  Pt reports increased difficulty with speech, R hand, and walking.   Patient Stated Goals improve balance/ walking, use of RUE   Currently in Pain? No/denies                      OT Treatments/Exercises (OP) - 01/03/15 0001    ADLs   Cooking Simple cooking task (making brownies).  Pt able to complete task but with multiple freezing episodes where therapist provided CGA and cueing for freezing strategies.  Pt demo difficulty stirring due to decr movement amplitude (difficulty getting all mix moistened) (despite cueing).  Pt demo difficulty opening/closing bottle with lid despite cues (due to hypokinesia).  Pt also washed dishes and cleaned up with difficulty washing due to decr amplitude movements.  Pt demo improvement with this with min v.c. Pt demo difficulty following written directions as well and needed min v.c. due to cognitive deficits.   Recommend pt consider using shot prior to cooking to decr freezing episodes while cooking as it likely contributes to significant fatigue that pt has reported and may reduce risk  for falls.  Also recommended pt consider using visual cues (painters tape) to help with foot placement during cooking.   Reviewed use of big movement strategies during cooking tasks. Pt verbalized understanding of recommendations.  Pt is inconsistent with report of difficulties during functional tasks at home (due to cognitive deficits and aphasia)                OT Education - 01/03/15 1314    Education provided Yes   Education Details Recommended pt try taking shot before cooking as pt reports/demo significant freezing when cooking.   Person(s) Educated Patient   Methods Explanation   Comprehension Verbalized understanding          OT Short Term  Goals - 12/29/14 1517    OT SHORT TERM GOAL #1   Title Pt will be independent with updated HEP.--due 12/31/14   Time 4   Period Weeks   Status On-going  12/29/14 pt not perfoming at home and needs min-mod cues in clinic   OT [place] #2   Title Pt will improve coordination as shown by completing 9-hole peg test in 90 secs or less with dominant R hand.-   Baseline 102 secs   Time 4   Period Weeks   Status On-going  12/27/14  96.72sec   OT SHORT TERM GOAL #3   Title Pt will be able to write at least 3 sentences with at least 50% legibility.   Time 4   Period Weeks   Status Achieved  12/27/14 Met at approx this level.   OT SHORT TERM GOAL #4   Title Pt will demonstrate ability to perfom RUE elbow extension at -10 for improved functional reach   Baseline baseline -18   Time 4   Period Weeks   Status On-going  12/27/14 -15*   OT SHORT TERM GOAL #5   Title ------------------------------------------------------------------------------------------------------------------------------------------           OT Long Term Goals - 12/29/14 1513    OT LONG TERM GOAL #1   Title Pt will verbalize understanding of adaptive strategies for increased safety and independence with  ADLs/IADLs prn    Status On-going   OT LONG TERM GOAL #2   Title Pt will improve coordination as shown by completing 9-hole peg test in 60 secs or less with right dominant hand   Status On-going   OT LONG TERM GOAL #3   Title Pt will demonstrate improved functional reaching/ coordination for ADLs as evidenced by improving box/ blocks score to 26 blocks for RUE.   Status Achieved  R-33 blocks 12/29/14   OT LONG TERM GOAL #4   Title Pt will demonstrate ability to retrieve lightweight items at 110 shoulder flexion with RUE.   Baseline baseline 105 for RUE   OT LONG TERM GOAL #5   Title Pt will demonstrate ability to perfrom PPT# 4(donning jacket in 25 secs or less without loss of balance)   Time 8   Period  Weeks   Status New   OT LONG TERM GOAL #6   Title Pt will perform simple cooking/ home management in standing with supervision without LOB.   Time 8   Period Weeks   Status New               Plan - 01/03/15 1740    Clinical Impression Statement Freezing significantly impacts pt's ability to cook and pt demo decr movement amplitude with functional UE movements for cooking.  Pt would benefit  from reinforcement of strategies for freezing and big amplitude movements within functional tasks as cognitive deficits affect carryover.   Plan  reinforcement of strategies for freezing and big amplitude movements within functional tasks, begin discussing ?d/c next week   OT Home Exercise Plan Education issued:  PWR! supine (basic 4), PWR! hands, card coordination HEP   Consulted and Agree with Plan of Care Patient        Problem List Patient Active Problem List   Diagnosis Date Noted  . Essential (primary) hypertension 12/27/2014  . Swelling of limb 12/27/2014  . Gastro-esophageal reflux disease without esophagitis 12/27/2014  . 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 Hypothyroidism 12/27/2014  . Mixed hyperlipidemia 12/27/2014  . Parkinson's disease ([place]) 12/27/2014  . Sequelae of cerebral infarction 12/27/2014  . Amaurosis fugax 08/16/2014  . Long term current use of anticoagulant 07/08/2014  . 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 Discoloration of skin of foot 03/15/2014  . PD (Parkinson's disease) ([place]) 03/15/2014  . Cerebral infarction due to embolism of cerebral artery ([place]) 03/15/2014  . Essential hypertension 03/15/2014  . HLD (hyperlipidemia) 03/15/2014  . Cough   . LV (left ventricular) mural thrombus ([place])   . CVA (cerebral infarction) 01/18/2014  . Imbalance 01/18/2014  . Slurring of speech   . Abnormality of gait 03/12/2012  . Paralysis agitans ([place]) 03/12/2012  . Acute cholecystitis 02/04/2012  .  Hypertension 02/04/2012  . Hyperlipidemia 02/04/2012    [facility] 01/03/2015, 5:47 PM  [facility] [address] [place], [place], [address] Phone: [phone]   Fax:  [phone]  Name: Kyle Cameron MRN: [id number] Date of Birth: 1926/11/15  Vianne Bulls, OTR/L 01/03/2015 5:47 PM

## 2015-01-03 NOTE — Therapy (Signed)
[facility] [address] [place], [place], [address] Phone: [phone]   Fax:  [phone]  Speech Language Pathology Treatment  Patient Details  Name: Kyle Cameron MRN: [id number] Date of Birth: 10-Oct-1926 Referring Provider: Jannifer Franklin, Dr. Lala Lund.  Encounter Date: 01/03/2015      End of Session - 01/03/15 1313    Visit Number 9   Number of Visits 17   Date for SLP Re-Evaluation 01/15/15   SLP Start Time 1233   SLP Stop Time  [id number]   SLP Time Calculation (min) 40 min      Past Medical History  Diagnosis Date  . Hypertension   . High cholesterol   . Prostate atrophy   . GERD (gastroesophageal reflux disease)   . History of diverticulitis of colon   . Benign enlargement of prostate   . Hypothyroidism   . History of renal calculi   . Parkinson's disease ([place])   . Renal calculi   . CVA (cerebral infarction)   . Stroke ([place])   . Anticoagulated on Coumadin   . Headache   . Labyrinthitis   . Grover's disease   . Cataracts, bilateral   . Parkinson's disease ([place])   . Amaurosis fugax 08/16/2014    Right eye    Past Surgical History  Procedure Laterality Date  . Cholecystectomy  02/04/2012    Procedure: LAPAROSCOPIC CHOLECYSTECTOMY WITH INTRAOPERATIVE CHOLANGIOGRAM;  Surgeon: Merrie Roof, MD;  Location: [facility];  Service: General;  Laterality: N/A;  . Lithotripsy      renal calculi  . Tonsillectomy    . Cataract extraction, bilateral      There were no vitals filed for this visit.  Visit Diagnosis: Hypokinetic Parkinsonian dysphonia      Subjective Assessment - 01/03/15 1240    Subjective "I'll try to be loud"               ADULT SLP TREATMENT - 01/03/15 1240    General Information   Behavior/Cognition Alert;Cooperative;Pleasant mood   Treatment Provided   Treatment provided Cognitive-Linquistic   Cognitive-Linquistic Treatment   Treatment focused on Dysarthria   Skilled Treatment  Loud /a/ average of 84dB - Pt performed automatic speech tasks at this loudness (average 80dB). Structured speech tasks generating senteces with a given multisyllabic word with average of  69dB with occasional min visual and verbal cues for breath support and volume. Simple conversation  average of  68dB with cues for breath support   Assessment / Recommendations / Plan   Plan Continue with current plan of care   Progression Toward Goals   Progression toward goals Progressing toward goals            SLP Short Term Goals - 01/03/15 1313    SLP SHORT TERM GOAL #1   Title pt will complete loud /a/ with average 82dB over 4 sessions with rare min A   Status Achieved   SLP SHORT TERM GOAL #2   Title Pt will improve loudness in 5 minutes simple conversation to average 68dB over two sessions   Status Achieved          SLP Long Term Goals - 01/03/15 1313    SLP LONG TERM GOAL #1   Title pt will sustain loud /a/ across 5 reps at average 83dB over four sessions   Time 5   Period Weeks   Status On-going   SLP LONG TERM GOAL #2   Title pt will demo average  68dB in 8 minutes simple conversation over two sessions   Time 5   Period Weeks   Status On-going   SLP LONG TERM GOAL #3   Title pt will achieve conversational speech intelligibility of 95% in min noisy environment over three sessions   Time 5   Period Weeks   Status On-going          Plan - 01/03/15 1312    Clinical Impression Statement Pt required min-mod A usually to incr loudness/effort and thus speech intelligibility. Continue skilled ST to maximize intellgibility and verabal expression.   Speech Therapy Frequency 2x / week   Treatment/Interventions SLP instruction and feedback;Compensatory strategies;Internal/external aids;Patient/family education;Functional tasks;Cueing hierarchy   Potential to Achieve Goals Fair   Potential Considerations Severity of impairments;Previous level of function        Problem  List Patient Active Problem List   Diagnosis Date Noted  . Essential (primary) hypertension 12/27/2014  . Swelling of limb 12/27/2014  . Gastro-esophageal reflux disease without esophagitis 12/27/2014  . Atrial thrombus following MI ([place]) 12/27/2014  . Hypothyroidism 12/27/2014  . Mixed hyperlipidemia 12/27/2014  . Parkinson's disease ([place]) 12/27/2014  . Sequelae of cerebral infarction 12/27/2014  . Amaurosis fugax 08/16/2014  . Long term current use of anticoagulant 07/08/2014  . Cerebral infarction due to embolism of left middle cerebral artery ([place]) 06/22/2014  . Discoloration of skin of foot 03/15/2014  . PD (Parkinson's disease) ([place]) 03/15/2014  . Cerebral infarction due to embolism of cerebral artery ([place]) 03/15/2014  . Essential hypertension 03/15/2014  . HLD (hyperlipidemia) 03/15/2014  . Cough   . LV (left ventricular) mural thrombus ([place])   . CVA (cerebral infarction) 01/18/2014  . Imbalance 01/18/2014  . Slurring of speech   . Abnormality of gait 03/12/2012  . Paralysis agitans ([place]) 03/12/2012  . Acute cholecystitis 02/04/2012  . Hypertension 02/04/2012  . Hyperlipidemia 02/04/2012    Takina Busser, Annye Rusk MS, CCC-SLP 01/03/2015, 1:14 PM  [facility] [address] [place], [place], [address] Phone: [phone]   Fax:  [phone]   Name: Kyle Cameron MRN: [id number] Date of Birth: Dec 17, 1926

## 2015-01-04 ENCOUNTER — Other Ambulatory Visit: Payer: Self-pay | Admitting: Neurology

## 2015-01-04 NOTE — Therapy (Signed)
[facility] [address] [place], [place], [address] Phone: [phone]   Fax:  [phone]  Physical Therapy Treatment  Patient Details  Name: Kyle Cameron MRN: [id number] Date of Birth: [birth year] Referring Provider: Lesly Dukes  Encounter Date: 01/03/2015      PT End of Session - 01/04/15 0801    Visit Number 10   Number of Visits 17   Date for PT Re-Evaluation 01/15/15   Authorization Type Medicare G-code every 10th visit   PT Start Time 1114  PT session started late due to therapist held in previous session   PT Stop Time 1146   PT Time Calculation (min) 32 min   Equipment Utilized During Treatment Gait belt   Activity Tolerance Patient tolerated treatment well   Behavior During Therapy [facility] for tasks assessed/performed      Past Medical History  Diagnosis Date  . Hypertension   . High cholesterol   . Prostate atrophy   . GERD (gastroesophageal reflux disease)   . History of diverticulitis of colon   . Benign enlargement of prostate   . Hypothyroidism   . History of renal calculi   . Parkinson's disease ([facility])   . Renal calculi   . CVA (cerebral infarction)   . Stroke ([facility])   . Anticoagulated on Coumadin   . Headache   . Labyrinthitis   . Grover's disease   . Cataracts, bilateral   . Parkinson's disease ([facility])   . Amaurosis fugax 08/16/2014    Right eye    Past Surgical History  Procedure Laterality Date  . Cholecystectomy  02/04/2012    Procedure: LAPAROSCOPIC CHOLECYSTECTOMY WITH INTRAOPERATIVE CHOLANGIOGRAM;  Surgeon: Robyne Askew, MD;  Location: [facility] OR;  Service: General;  Laterality: N/A;  . Lithotripsy      renal calculi  . Tonsillectomy    . Cataract extraction, bilateral      There were no vitals filed for this visit.  Visit Diagnosis:  Abnormality of gait  Festinating gait  Bradykinesia  Generalized muscle weakness      Subjective Assessment - 01/03/15 1117     Subjective No changes, no falls; has not used the Apokyn today.  "Too much going on in general."                         [facility] Adult PT Treatment/Exercise - 01/03/15 1123    Transfers   Transfers Sit to Stand;Stand to Sit   Sit to Stand 5: Supervision   Stand to Sit 5: Supervision   Ambulation/Gait   Ambulation/Gait Yes   Ambulation/Gait Assistance 5: Supervision   Ambulation Distance (Feet) 500 Feet   Assistive device Other (Comment)  U-Step RW   Gait Pattern Narrow base of support;Poor foot clearance - left;Poor foot clearance - right;Festinating   Ambulation Surface Level;Indoor   Gait velocity 12.80 sec= 2.56 ft/sec   Timed Up and Go Test   Normal TUG (seconds) 48.25  31.05 sec with cues for wide base turns   High Level Balance   High Level Balance Comments Counter balance exercises emphasizing increased step length, foot clearance, and weigthshifting:  Holding on to counter with 1 UE support, forward/back step and weigthshift x 15 reps, side step and weightshift x 15 reps, stagger stance forwar/back weigthshift x 15 reps each foot position, with pt experiencing 1 LOB leading to freezing episode when trying to reposition lower extremities.  Forward step taps to 6 and  12 inch step, then back step and weightshift with UE support and cues for improved intensity of movement patterns with supervision/min guard assistance.     Heel/toe raises 2 sets x 10 reps, for improved ant/posterior weigthshifting.  With turning to sit with gait, pt experiences increased festinating/freezing episodes, with cues provided for widened turns.             PT Short Term Goals - 12/21/14 1004    PT SHORT TERM GOAL #1   Title Pt will perform HEP with family/caregiver supervision for improved transfers, balance and gait.  TARGET 12/16/14   Time 4   Period Weeks   Status New   PT SHORT TERM GOAL #2   Title Pt will improve 5x sit<>stand to less than or equal to 15 seconds with  no posterior lean for improved transfer efficiency and safety.   Baseline 19.3 sec 12/20/14, but with improved forward lean   Time 4   Period Weeks   Status Not Met   PT SHORT TERM GOAL #3   Title Pt will verbalize/demonstrate understanding of techniques to reduce freezing episodes with gait.   Baseline Pt able to verbalize, but not consistently able to demo.   Time 4   Period Weeks   Status Partially Met   PT SHORT TERM GOAL #4   Title Pt will be able to initiate gait within less than 30 seconds upon standing    Baseline Using U-step RW, pt able to initiate gait in 3.5 sec   Time 4   Period Weeks   Status Achieved           PT Long Term Goals - 11/16/14 1335    PT LONG TERM GOAL #1   Title Pt will verbalize/demonstrate understanding of fall prevention techniques within the home environment.  (TARGET 01/15/15)   Time 8   Period Weeks   Status New   PT LONG TERM GOAL #2   Title Pt will improve gait velocity to at least 2.3 ft/sec for improved efficiency and safety with gait.   Time 8   Period Weeks   Status New   PT LONG TERM GOAL #3   Title Pt will be able to complete TUG test in less than 2 minutes, to demonstrate improved short distance functional mobility within the home.   Time 8   Period Weeks   Status New   PT LONG TERM GOAL #4   Title Further assessment for assistive device to be completed for additional safety within home and community.   Time 8   Period Weeks   Status New   PT LONG TERM GOAL #5   Title Pt will verbalize plans for continued community fitness upon D/C from PT.   Time 8   Period Weeks   Status New               Plan - 01/04/15 0803    Clinical Impression Statement Pt continues to have significant freezing episodes during turns and initiation of gait today during PT session.  Pt reports not feeling the laser line helped much last visit.  He reports not doing much walking at home due to freezing.  No family members/support present for  education in carryover of freezing reduction techniques today.   Pt will benefit from skilled therapeutic intervention in order to improve on the following deficits Abnormal gait;Decreased activity tolerance;Decreased balance;Decreased mobility;Decreased strength;Difficulty walking;Postural dysfunction   Rehab Potential Good   PT Frequency 2x / week  PT Duration 8 weeks  plus eval   PT Treatment/Interventions ADLs/Self Care Home Management;Therapeutic exercise;Therapeutic activities;Functional mobility training;Gait training;DME Instruction;Balance training;Neuromuscular re-education;Patient/family education   PT Next Visit Plan Review HEP, gait and transfer activities, begin preparing for discharge in next 1-2 weeks   Consulted and Agree with Plan of Care Patient          G-Codes - 2015-01-17 0806    Functional Assessment Tool Used gait velocity 2.56 ft/sec, TUG 48.25 seconds with U-step RW   Functional Limitation Mobility: Walking and moving around   Mobility: Walking and Moving Around Current Status [phone]) At least 40 percent but less than 60 percent impaired, limited or restricted   Mobility: Walking and Moving Around Goal Status [phone]) At least 20 percent but less than 40 percent impaired, limited or restricted      Problem List Patient Active Problem List   Diagnosis Date Noted  . Essential (primary) hypertension 12/27/2014  . Swelling of limb 12/27/2014  . Gastro-esophageal reflux disease without esophagitis 12/27/2014  . 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 Hypothyroidism 12/27/2014  . Mixed hyperlipidemia 12/27/2014  . Parkinson's disease ([place]) 12/27/2014  . Sequelae of cerebral infarction 12/27/2014  . Amaurosis fugax 08/16/2014  . Long term current use of anticoagulant 07/08/2014  . Cerebral infarction due to embolism of left middle cerebral artery ([place]) 06/22/2014  . Discoloration of skin of foot 03/15/2014  . PD (Parkinson's disease) ([place]) 03/15/2014  .  Cerebral infarction due to embolism of cerebral artery ([place]) 03/15/2014  . Essential hypertension 03/15/2014  . HLD (hyperlipidemia) 03/15/2014  . Cough   . LV (left ventricular) mural thrombus ([place])   . CVA (cerebral infarction) 01/18/2014  . Imbalance 01/18/2014  . Slurring of speech   . Abnormality of gait 03/12/2012  . Paralysis agitans ([place]) 03/12/2012  . Acute cholecystitis 02/04/2012  . Hypertension 02/04/2012  . Hyperlipidemia 02/04/2012    Edwena Mayorga W. 01/17/2015, 8:08 AM  Frazier Butt., PT  [facility] [address] [place], [place], [address] Phone: [phone]   Fax:  [phone]  Name: Knowledge Escandon MRN: [id number] Date of Birth: [birth year] Physical Therapy Progress Note  Dates of Reporting Period: 11/16/14 to 01/03/15  Objective Reports of Subjective Statement: Pt not using Apokyn today prior to session.  Pt reports not walking a lot at home due to freezing episodes, even with acquisition of U-step RW.  Objective Measurements: TUG 48.25 (improved since eval-pt not able to complete at eval due to freezing with turns).  Gait velocity 2.56 ft/sec  Goal Update:      PT Short Term Goals - 12/21/14 1004    PT SHORT TERM GOAL #1   Title Pt will perform HEP with family/caregiver supervision for improved transfers, balance and gait.  TARGET 12/16/14   Time 4   Period Weeks   Status New   PT SHORT TERM GOAL #2   Title Pt will improve 5x sit<>stand to less than or equal to 15 seconds with no posterior lean for improved transfer efficiency and safety.   Baseline 19.3 sec 12/20/14, but with improved forward lean   Time 4   Period Weeks   Status Not Met   PT SHORT TERM GOAL #3   Title Pt will verbalize/demonstrate understanding of techniques to reduce freezing episodes with gait.   Baseline Pt able to verbalize, but not consistently able to demo.   Time 4   Period Weeks   Status Partially Met  PT SHORT TERM GOAL #4   Title Pt will be able to initiate gait within less than 30 seconds upon standing    Baseline Using U-step RW, pt able to initiate gait in 3.5 sec   Time 4   Period Weeks   Status Achieved    Pt continuing to consistently progress towards LTGs.  Plan: Continue gait and balance/weigthshifting activities, with plans to begin checking goals, review HEP and plan for discharge in the next 1-2 weeks.  Reason Skilled Services are Required: Continued fall risk, improvement in therapy sessions in gait and turns.  Mady Haagensen, PT 01/04/2015 8:12 AM Phone: [phone] Fax: [phone]

## 2015-01-05 ENCOUNTER — Ambulatory Visit: Payer: Medicare Other | Admitting: Speech Pathology

## 2015-01-05 ENCOUNTER — Ambulatory Visit: Payer: Medicare Other | Admitting: Physical Therapy

## 2015-01-05 ENCOUNTER — Ambulatory Visit: Payer: Medicare Other | Admitting: Occupational Therapy

## 2015-01-05 ENCOUNTER — Encounter: Payer: Self-pay | Admitting: Occupational Therapy

## 2015-01-05 DIAGNOSIS — R6889 Other general symptoms and signs: Secondary | ICD-10-CM

## 2015-01-05 DIAGNOSIS — R4189 Other symptoms and signs involving cognitive functions and awareness: Secondary | ICD-10-CM

## 2015-01-05 DIAGNOSIS — R2681 Unsteadiness on feet: Secondary | ICD-10-CM

## 2015-01-05 DIAGNOSIS — R279 Unspecified lack of coordination: Secondary | ICD-10-CM

## 2015-01-05 DIAGNOSIS — R269 Unspecified abnormalities of gait and mobility: Secondary | ICD-10-CM

## 2015-01-05 DIAGNOSIS — R490 Dysphonia: Secondary | ICD-10-CM

## 2015-01-05 DIAGNOSIS — R29898 Other symptoms and signs involving the musculoskeletal system: Secondary | ICD-10-CM

## 2015-01-05 DIAGNOSIS — Z7409 Other reduced mobility: Secondary | ICD-10-CM

## 2015-01-05 NOTE — Therapy (Signed)
[facility] [address] [place], [place], [address] Phone: [phone]   Fax:  [phone]  Physical Therapy Treatment  Patient Details  Name: Kyle Cameron MRN: [id number] Date of Birth: 1926/05/02 Referring Provider: Lenor Coffin  Encounter Date: 01/05/2015      PT End of Session - 01/05/15 1753    Visit Number 11   Number of Visits 17   Date for PT Re-Evaluation 01/15/15   Authorization Type Medicare G-code every 10th visit   PT Start Time 1408   PT Stop Time 1446   PT Time Calculation (min) 38 min   Equipment Utilized During Treatment Gait belt   Activity Tolerance Patient tolerated treatment well   Behavior During Therapy [facility] for tasks assessed/performed      Past Medical History  Diagnosis Date  . Hypertension   . High cholesterol   . Prostate atrophy   . GERD (gastroesophageal reflux disease)   . History of diverticulitis of colon   . Benign enlargement of prostate   . Hypothyroidism   . History of renal calculi   . Parkinson's disease ([place])   . Renal calculi   . CVA (cerebral infarction)   . Stroke ([place])   . Anticoagulated on Coumadin   . Headache   . Labyrinthitis   . Grover's disease   . Cataracts, bilateral   . Parkinson's disease ([place])   . Amaurosis fugax 08/16/2014    Right eye    Past Surgical History  Procedure Laterality Date  . Cholecystectomy  02/04/2012    Procedure: LAPAROSCOPIC CHOLECYSTECTOMY WITH INTRAOPERATIVE CHOLANGIOGRAM;  Surgeon: Merrie Roof, MD;  Location: [facility];  Service: General;  Laterality: N/A;  . Lithotripsy      renal calculi  . Tonsillectomy    . Cataract extraction, bilateral      There were no vitals filed for this visit.  Visit Diagnosis:  Abnormality of gait      Subjective Assessment - 01/05/15 1751    Subjective Denies falls or changes.  States he [place] Apokyn last night while cooking but did not think that it helped any with  freezing.  Feels comfortable with plan for d/c next week per POC.   Pertinent History CVA 12/26/13 and 01/18/14, Parkinson's disease   Patient Stated Goals Pt's goal for therapy is to have therapy in conjunction with Apomorphine to walk better.   Currently in Pain? No/denies      Standing at counter for PWR! Up x 20, PWR! Rock x 20 with visual targets on cabinet, PWR! Step x 20-all with bil UE support. Forward/backward step weight shifting at counter x 20 with 1 UE support and cues to complete weight shifting. Stepping forward and lateral across hurdle with 1 UE support   Sit<>stand x 10 for LE strengthening and flexibility.  Cues to maintain forward weight shift. Bil Leg Press x 60# x 15 with significant verbal and tactile cues to complete full knee extension and slow down technique.  Pt initially with very rapid movement and not completing full range.  Gait with Ustep x 500'plus working on turns and addressing freezing as it occurred.  Pt needed cues to initiate some techniques to reduce freezing.       PT Education - 01/05/15 1752    Education provided Yes   Education Details Reviewed HEP, slowing movements down, preparing for d/c next week per POC.   Person(s) Educated Patient   Methods Explanation;Demonstration   Comprehension Verbalized understanding;Returned  demonstration          PT Short Term Goals - 12/21/14 1004    PT SHORT TERM GOAL #1   Title Pt will perform HEP with family/caregiver supervision for improved transfers, balance and gait.  TARGET 12/16/14   Time 4   Period Weeks   Status New   PT SHORT TERM GOAL #2   Title Pt will improve 5x sit<>stand to less than or equal to 15 seconds with no posterior lean for improved transfer efficiency and safety.   Baseline 19.3 sec 12/20/14, but with improved forward lean   Time 4   Period Weeks   Status Not Met   PT SHORT TERM GOAL #3   Title Pt will verbalize/demonstrate understanding of techniques to reduce freezing  episodes with gait.   Baseline Pt able to verbalize, but not consistently able to demo.   Time 4   Period Weeks   Status Partially Met   PT SHORT TERM GOAL #4   Title Pt will be able to initiate gait within less than 30 seconds upon standing    Baseline Using U-step RW, pt able to initiate gait in 3.5 sec   Time 4   Period Weeks   Status Achieved           PT Long Term Goals - 11/16/14 1335    PT LONG TERM GOAL #1   Title Pt will verbalize/demonstrate understanding of fall prevention techniques within the home environment.  (TARGET 01/15/15)   Time 8   Period Weeks   Status New   PT LONG TERM GOAL #2   Title Pt will improve gait velocity to at least 2.3 ft/sec for improved efficiency and safety with gait.   Time 8   Period Weeks   Status New   PT LONG TERM GOAL #3   Title Pt will be able to complete TUG test in less than 2 minutes, to demonstrate improved short distance functional mobility within the home.   Time 8   Period Weeks   Status New   PT LONG TERM GOAL #4   Title Further assessment for assistive device to be completed for additional safety within home and community.   Time 8   Period Weeks   Status New   PT LONG TERM GOAL #5   Title Pt will verbalize plans for continued community fitness upon D/C from PT.   Time 8   Period Weeks   Status New               Plan - 01/05/15 1754    Clinical Impression Statement Pt continues to report not walking at home due to freezing and uses a scooter when out in community when available.  No caregivers present today.  Pt able to verbalize techniques to reduce freezing yet needs reinforcment at times to put them into practice.  Continue PT per POC with d/c next week per Mady Haagensen, PT.   Pt will benefit from skilled therapeutic intervention in order to improve on the following deficits Abnormal gait;Decreased activity tolerance;Decreased balance;Decreased mobility;Decreased strength;Difficulty walking;Postural  dysfunction   Rehab Potential Good   PT Frequency 2x / week   PT Duration 8 weeks  plus eval   PT Treatment/Interventions ADLs/Self Care Home Management;Therapeutic exercise;Therapeutic activities;Functional mobility training;Gait training;DME Instruction;Balance training;Neuromuscular re-education;Patient/family education   PT Next Visit Plan Check goals and discharge per Mady Haagensen, PT.   Consulted and Agree with Plan of Care Patient  Problem List Patient Active Problem List   Diagnosis Date Noted  . Essential (primary) hypertension 12/27/2014  . Swelling of limb 12/27/2014  . Gastro-esophageal reflux disease without esophagitis 12/27/2014  . 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 Hypothyroidism 12/27/2014  . Mixed hyperlipidemia 12/27/2014  . Parkinson's disease ([place]) 12/27/2014  . Sequelae of cerebral infarction 12/27/2014  . Amaurosis fugax 08/16/2014  . Long term current use of anticoagulant 07/08/2014  . Cerebral infarction due to embolism of left middle cerebral artery ([place]) 06/22/2014  . Discoloration of skin of foot 03/15/2014  . PD (Parkinson's disease) ([place]) 03/15/2014  . Cerebral infarction due to embolism of cerebral artery ([place]) 03/15/2014  . Essential hypertension 03/15/2014  . HLD (hyperlipidemia) 03/15/2014  . Cough   . LV (left ventricular) mural thrombus ([place])   . CVA (cerebral infarction) 01/18/2014  . Imbalance 01/18/2014  . Slurring of speech   . Abnormality of gait 03/12/2012  . Paralysis agitans ([place]) 03/12/2012  . Acute cholecystitis 02/04/2012  . Hypertension 02/04/2012  . Hyperlipidemia 02/04/2012    [facility] [address] [place], [place], [address] Phone: [phone]   Fax:  [phone]  Name: Kyle Cameron MRN: [id number] Date of Birth: October 06, 1926    [name], [place] 01/05/2015 5:58 PM Phone:  [phone] Fax: [phone]

## 2015-01-05 NOTE — Therapy (Signed)
[facility] [address] [place], [place], [address] Phone: [phone]   Fax:  [phone]  Occupational Therapy Treatment  Patient Details  Name: Kyle Cameron MRN: [id number] Date of Birth: 07/17/1926 Referring Provider: Dr. Jannifer Franklin  Encounter Date: 01/05/2015      OT End of Session - 01/05/15 1400    Visit Number 12   Number of Visits 17   Date for OT Re-Evaluation 01/21/15   Authorization Type 1Medicare, 2AARP, G-code needed   Authorization Time Period kx   Authorization - Visit Number 11   Authorization - Number of Visits 20   OT Start Time 9628   OT Stop Time 1400   OT Time Calculation (min) 43 min   Activity Tolerance Patient tolerated treatment well   Behavior During Therapy [facility] for tasks assessed/performed;Impulsive      Past Medical History  Diagnosis Date  . Hypertension   . High cholesterol   . Prostate atrophy   . GERD (gastroesophageal reflux disease)   . History of diverticulitis of colon   . Benign enlargement of prostate   . Hypothyroidism   . History of renal calculi   . Parkinson's disease ([place])   . Renal calculi   . CVA (cerebral infarction)   . Stroke ([place])   . Anticoagulated on Coumadin   . Headache   . Labyrinthitis   . Grover's disease   . Cataracts, bilateral   . Parkinson's disease ([place])   . Amaurosis fugax 08/16/2014    Right eye    Past Surgical History  Procedure Laterality Date  . Cholecystectomy  02/04/2012    Procedure: LAPAROSCOPIC CHOLECYSTECTOMY WITH INTRAOPERATIVE CHOLANGIOGRAM;  Surgeon: Merrie Roof, MD;  Location: [facility];  Service: General;  Laterality: N/A;  . Lithotripsy      renal calculi  . Tonsillectomy    . Cataract extraction, bilateral      There were no vitals filed for this visit.  Visit Diagnosis:  Hypokinesia  Lack of coordination  Decreased functional mobility and endurance  Rigidity  Unsteadiness  Cognitive deficits       Subjective Assessment - 01/05/15 1320    Subjective  Pt reports that he used the shot once before cooking, but he feels like it didn't help much   Pertinent History Pt with PD (diagnosed 3 years ago) with 2 CVAs  Pt reports PD worse after CVA.  Pt reports increased difficulty with speech, R hand, and walking.   Patient Stated Goals improve balance/ walking, use of RUE   Currently in Pain? No/denies                      OT Treatments/Exercises (OP) - 01/05/15 0001    ADLs   Cooking Simulated cooking task to gather, retrieve and carry objects, reaching in cabinets/microwave/refridgerator.  Pt with multiple freezing episodes with min cueing to use compensation strategies.  Pt with incr ease overcoming freezing episodes today.  No LOB.   ADL Comments began checking goals and discussing progress--see goals section.  Encouraged pt to perform coordination HEP at home due to continued difficulty.   Functional Reaching Activities   High Level Functional reaching with each UE to place small pegs in pegboard to copy simple design for cognitive component.  Pt with mod difficulty/drops with RUE coordination, and no significant difficulty with LUE.      Placing medium pegs in pegboard with R hand for incr coodination/in-hand manipulation with min-mod difficulty and  min cues to slow down/timing.           OT Education - 01/05/15 1710    Education Details Recommended pt try taking shot several more times before cooking as pt reports inconsistent results with shot   Person(s) Educated Patient   Methods Explanation   Comprehension Verbalized understanding          OT Short Term Goals - 01/05/15 1337    OT SHORT TERM GOAL #1   Title Pt will be independent with updated HEP.--due 12/31/14   Time 4   Period Weeks   Status On-going  12/29/14 pt not perfoming at home and needs min-mod cues in clinic   OT [place] #2   Title Pt will improve coordination as shown by completing  9-hole peg test in 90 secs or less with dominant R hand.-   Baseline 102 secs   Time 4   Period Weeks   Status Partially Met  12/27/14  96.72sec;  Not fully met 01/05/15 110.50sec, 55.34sec (with cues for deliberate movement 2nd time)   OT SHORT TERM GOAL #3   Title Pt will be able to write at least 3 sentences with at least 50% legibility.   Time 4   Period Weeks   Status Achieved  12/27/14 Met at approx this level.   OT SHORT TERM GOAL #4   Title Pt will demonstrate ability to perfom RUE elbow extension at -10 for improved functional reach   Baseline baseline -18   Time 4   Period Weeks   Status Achieved  12/27/14 -15*; 01/05/15 -10*   OT SHORT TERM GOAL #5   Title ------------------------------------------------------------------------------------------------------------------------------------------           OT Long Term Goals - 01/05/15 1323    OT LONG TERM GOAL #1   Title Pt will verbalize understanding of adaptive strategies for increased safety and independence with  ADLs/IADLs prn    Status On-going   OT LONG TERM GOAL #2   Title Pt will improve coordination as shown by completing 9-hole peg test in 60 secs or less with right dominant hand   Status Partially Met  12/27/14  96.72sec;  Not fully/consistently met 01/05/15 110.50sec, 55.34sec (with cues for deliberate movement 2nd time)   OT LONG TERM GOAL #3   Title Pt will demonstrate improved functional reaching/ coordination for ADLs as evidenced by improving box/ blocks score to 26 blocks for RUE.   Status Achieved  R-33 blocks 12/29/14   OT LONG TERM GOAL #4   Title Pt will demonstrate ability to retrieve lightweight items at 110 shoulder flexion with RUE.   Baseline baseline 105 for RUE   Status Achieved  01/05/15  L-135*, R-130*   OT LONG TERM GOAL #5   Title Pt will demonstrate ability to perfrom PPT# 4(donning jacket in 25 secs or less without loss of balance)   Time 8   Period Weeks   Status Not Met   01/05/15: 60.84, 39.19sec with no LOB   OT LONG TERM GOAL #6   Title Pt will perform simple cooking/ home management in standing with supervision without LOB.   Time 8   Period Weeks   Status On-going               Plan - 01/05/15 1358    Clinical Impression Statement Pt demo decr freezing with simulated kitchen tasks today and was able to utilize strategies easier with min-mod cueing.     Plan plan to  d/c next week/session; reinforce strategies for freezing and big amplitude movements, review HEP iprn    Consulted and Agree with Plan of Care Patient        Problem List Patient Active Problem List   Diagnosis Date Noted  . Essential (primary) hypertension 12/27/2014  . Swelling of limb 12/27/2014  . Gastro-esophageal reflux disease without esophagitis 12/27/2014  . 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 Hypothyroidism 12/27/2014  . Mixed hyperlipidemia 12/27/2014  . Parkinson's disease ([place]) 12/27/2014  . Sequelae of cerebral infarction 12/27/2014  . Amaurosis fugax 08/16/2014  . Long term current use of anticoagulant 07/08/2014  . Cerebral infarction due to embolism of left middle cerebral artery ([place]) 06/22/2014  . Discoloration of skin of foot 03/15/2014  . PD (Parkinson's disease) ([place]) 03/15/2014  . Cerebral infarction due to embolism of cerebral artery ([place]) 03/15/2014  . Essential hypertension 03/15/2014  . HLD (hyperlipidemia) 03/15/2014  . Cough   . LV (left ventricular) mural thrombus ([place])   . CVA (cerebral infarction) 01/18/2014  . Imbalance 01/18/2014  . Slurring of speech   . Abnormality of gait 03/12/2012  . Paralysis agitans ([place]) 03/12/2012  . Acute cholecystitis 02/04/2012  . Hypertension 02/04/2012  . Hyperlipidemia 02/04/2012    [facility] 01/05/2015, 5:21 PM  [facility] [address] [place], [place], [address] Phone: [phone]   Fax:  [phone]  Name:  Kyle Cameron MRN: [id number] Date of Birth: [birth year]  Vianne Bulls, OTR/L 01/05/2015 5:21 PM

## 2015-01-05 NOTE — Therapy (Signed)
[facility] [address] [place], [place], [address] Phone: [phone]   Fax:  [phone]  Speech Language Pathology Treatment  Patient Details  Name: Kyle Cameron MRN: [id number] Date of Birth: 12/10/1926 Referring Provider: Jannifer Franklin, Dr. Lala Lund.  Encounter Date: 01/05/2015      End of Session - 01/05/15 1320    Visit Number 10   Date for SLP Re-Evaluation 01/15/15   SLP Start Time [id number]   SLP Stop Time  1315   SLP Time Calculation (min) 44 min      Past Medical History  Diagnosis Date  . Hypertension   . High cholesterol   . Prostate atrophy   . GERD (gastroesophageal reflux disease)   . History of diverticulitis of colon   . Benign enlargement of prostate   . Hypothyroidism   . History of renal calculi   . Parkinson's disease ([place])   . Renal calculi   . CVA (cerebral infarction)   . Stroke ([place])   . Anticoagulated on Coumadin   . Headache   . Labyrinthitis   . Grover's disease   . Cataracts, bilateral   . Parkinson's disease ([place])   . Amaurosis fugax 08/16/2014    Right eye    Past Surgical History  Procedure Laterality Date  . Cholecystectomy  02/04/2012    Procedure: LAPAROSCOPIC CHOLECYSTECTOMY WITH INTRAOPERATIVE CHOLANGIOGRAM;  Surgeon: Merrie Roof, MD;  Location: [facility];  Service: General;  Laterality: N/A;  . Lithotripsy      renal calculi  . Tonsillectomy    . Cataract extraction, bilateral      There were no vitals filed for this visit.  Visit Diagnosis: Hypokinetic Parkinsonian dysphonia      Subjective Assessment - 01/05/15 1249    Subjective "It's exhausting to freeze"   Currently in Pain? No/denies               ADULT SLP TREATMENT - 01/05/15 1249    General Information   Behavior/Cognition Alert;Cooperative;Pleasant mood   Treatment Provided   Treatment provided Cognitive-Linquistic   Pain Assessment   Pain Assessment No/denies pain   Cognitive-Linquistic  Treatment   Treatment focused on Dysarthria   Skilled Treatment Loud /a/ average 84dB to reset loud volume. Simple 1-3 word phrases practiced with big breath before each one and instruction to  "Think shout" pt requried on going cues to use adequate breath support before each phrase. average 69dB. Simple conversation average 68dB with occasional min verbal and visual cues for loudness and breath support. Pt. feels his shots are helping his voice.    Assessment / Recommendations / Plan   Plan Continue with current plan of care   Progression Toward Goals   Progression toward goals Progressing toward goals            SLP Short Term Goals - 01/05/15 1319    SLP SHORT TERM GOAL #1   Title pt will complete loud /a/ with average 82dB over 4 sessions with rare min A   Status Achieved   SLP SHORT TERM GOAL #2   Title Pt will improve loudness in 5 minutes simple conversation to average 68dB over two sessions   Status Achieved          SLP Long Term Goals - 01/05/15 1319    SLP LONG TERM GOAL #1   Title pt will sustain loud /a/ across 5 reps at average 83dB over four sessions   Time 5   Period  Weeks   Status Achieved   SLP LONG TERM GOAL #2   Title pt will demo average 68dB in 8 minutes simple conversation over two sessions   Time 5   Period Weeks   Status On-going   SLP LONG TERM GOAL #3   Title pt will achieve conversational speech intelligibility of 95% in min noisy environment over three sessions   Time 5   Period Weeks   Status On-going          Plan - Jan 07, 2015 1319    Clinical Impression Statement Pt with mildly improved volume today - with min cues to maintain 69dB in simple conversation. Continue skilled ST to maximize intelligibility   Speech Therapy Frequency 2x / week   Treatment/Interventions SLP instruction and feedback;Compensatory strategies;Internal/external aids;Patient/family education;Functional tasks;Cueing hierarchy   Potential to Achieve Goals Fair    Potential Considerations Severity of impairments;Previous level of function          G-Codes - Jan 07, 2015 1320    Functional Assessment Tool Used NOMS   Functional Limitations Motor speech   Motor Speech Current Status [id number]) At least 40 percent but less than 60 percent impaired, limited or restricted   Motor Speech Goal Status [id number]) At least 40 percent but less than 60 percent impaired, limited or restricted     Speech Therapy Progress Note  Dates of Reporting Period: 10/20/14 to Jan 07, 2015 - 10 visits  Objective Reports of Subjective Statement: Pt. States he feels his voice has gotten stronger, in part due to the "shots"  Objective Measurements: Pt requiring min to mod A to maintain an average of 68dB during simple conversation as measured by sound level meter.  Goal Update: See goals above - continue current long term goals  Plan: Continue skilled ST to maximize intelligibility and word finding compensations  Reason Skilled Services are Required: Pt continues to required min to mod A to maintain audible volume during simple conversation and in loud environment, reducing his intelligibility at home and in the community.  Problem List Patient Active Problem List   Diagnosis Date Noted  . Essential (primary) hypertension 12/27/2014  . Swelling of limb 12/27/2014  . Gastro-esophageal reflux disease without esophagitis 12/27/2014  . 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 Hypothyroidism 12/27/2014  . Mixed hyperlipidemia 12/27/2014  . Parkinson's disease ([place]) 12/27/2014  . Sequelae of cerebral infarction 12/27/2014  . Amaurosis fugax 08/16/2014  . Long term current use of anticoagulant 07/08/2014  . Cerebral infarction due to embolism of left middle cerebral artery ([place]) 06/22/2014  . Discoloration of skin of foot 03/15/2014  . PD (Parkinson's disease) ([place]) 03/15/2014  . Cerebral infarction due to embolism of cerebral artery ([place]) 03/15/2014  . Essential hypertension  03/15/2014  . HLD (hyperlipidemia) 03/15/2014  . Cough   . LV (left ventricular) mural thrombus ([place])   . CVA (cerebral infarction) 01/18/2014  . Imbalance 01/18/2014  . Slurring of speech   . Abnormality of gait 03/12/2012  . Paralysis agitans ([place]) 03/12/2012  . Acute cholecystitis 02/04/2012  . Hypertension 02/04/2012  . Hyperlipidemia 02/04/2012    [place], Annye Rusk MS, CCC-SLP 2015-01-07, 2:06 PM  [facility] [address] [place], [place], [address] Phone: [phone]   Fax:  [phone]   Name: Vyan Vanore MRN: [id number] Date of Birth: 16-Jul-1926

## 2015-01-11 ENCOUNTER — Ambulatory Visit: Payer: Medicare Other | Admitting: Physical Therapy

## 2015-01-11 ENCOUNTER — Ambulatory Visit: Payer: Medicare Other | Admitting: Occupational Therapy

## 2015-01-11 DIAGNOSIS — R4189 Other symptoms and signs involving cognitive functions and awareness: Secondary | ICD-10-CM

## 2015-01-11 DIAGNOSIS — R2689 Other abnormalities of gait and mobility: Secondary | ICD-10-CM

## 2015-01-11 DIAGNOSIS — R29898 Other symptoms and signs involving the musculoskeletal system: Secondary | ICD-10-CM

## 2015-01-11 DIAGNOSIS — Z7409 Other reduced mobility: Secondary | ICD-10-CM

## 2015-01-11 DIAGNOSIS — R269 Unspecified abnormalities of gait and mobility: Secondary | ICD-10-CM

## 2015-01-11 DIAGNOSIS — R279 Unspecified lack of coordination: Secondary | ICD-10-CM

## 2015-01-11 DIAGNOSIS — R258 Other abnormal involuntary movements: Secondary | ICD-10-CM

## 2015-01-11 NOTE — Patient Instructions (Signed)
Fall Prevention in the Home  Falls can cause injuries and can affect people from all age groups. There are many simple things that you can do to make your home safe and to help prevent falls. WHAT CAN I DO ON THE OUTSIDE OF MY HOME?  Regularly repair the edges of walkways and driveways and fix any cracks.  Remove high doorway thresholds.  Trim any shrubbery on the main path into your home.  Use bright outdoor lighting.  Clear walkways of debris and clutter, including tools and rocks.  Regularly check that handrails are securely fastened and in good repair. Both sides of any steps should have handrails.  Install guardrails along the edges of any raised decks or porches.  Have leaves, snow, and ice cleared regularly.  Use sand or salt on walkways during winter months.  In the garage, clean up any spills right away, including grease or oil spills. WHAT CAN I DO IN THE BATHROOM?  Use night lights.  Install grab bars by the toilet and in the tub and shower. Do not use towel bars as grab bars.  Use non-skid mats or decals on the floor of the tub or shower.  If you need to sit down while you are in the shower, use a plastic, non-slip stool..  Keep the floor dry. Immediately clean up any water that spills on the floor.  Remove soap buildup in the tub or shower on a regular basis.  Attach bath mats securely with double-sided non-slip rug tape.  Remove throw rugs and other tripping hazards from the floor. WHAT CAN I DO IN THE BEDROOM?  Use night lights.  Make sure that a bedside light is easy to reach.  Do not use oversized bedding that drapes onto the floor.  Have a firm chair that has side arms to use for getting dressed.  Remove throw rugs and other tripping hazards from the floor. WHAT CAN I DO IN THE KITCHEN?   Clean up any spills right away.  Avoid walking on wet floors.  Place frequently used items in easy-to-reach places.  If you need to reach for something  above you, use a sturdy step stool that has a grab bar.  Keep electrical cables out of the way.  Do not use floor polish or wax that makes floors slippery. If you have to use wax, make sure that it is non-skid floor wax.  Remove throw rugs and other tripping hazards from the floor. WHAT CAN I DO IN THE STAIRWAYS?  Do not leave any items on the stairs.  Make sure that there are handrails on both sides of the stairs. Fix handrails that are broken or loose. Make sure that handrails are as long as the stairways.  Check any carpeting to make sure that it is firmly attached to the stairs. Fix any carpet that is loose or worn.  Avoid having throw rugs at the top or bottom of stairways, or secure the rugs with carpet tape to prevent them from moving.  Make sure that you have a light switch at the top of the stairs and the bottom of the stairs. If you do not have them, have them installed. WHAT ARE SOME OTHER FALL PREVENTION TIPS?  Wear closed-toe shoes that fit well and support your feet. Wear shoes that have rubber soles or low heels.  When you use a stepladder, make sure that it is completely opened and that the sides are firmly locked. Have someone hold the ladder while you   are using it. Do not climb a closed stepladder.  Add color or contrast paint or tape to grab bars and handrails in your home. Place contrasting color strips on the first and last steps.  Use mobility aids as needed, such as canes, walkers, scooters, and crutches.  Turn on lights if it is dark. Replace any light bulbs that burn out.  Set up furniture so that there are clear paths. Keep the furniture in the same spot.  Fix any uneven floor surfaces.  Choose a carpet design that does not hide the edge of steps of a stairway.  Be aware of any and all pets.  Review your medicines with your healthcare provider. Some medicines can cause dizziness or changes in blood pressure, which increase your risk of falling. Talk  with your health care provider about other ways that you can decrease your risk of falls. This may include working with a physical therapist or trainer to improve your strength, balance, and endurance.   This information is not intended to replace advice given to you by your health care provider. Make sure you discuss any questions you have with your health care provider.   Document Released: 12/28/2001 Document Revised: 05/24/2014 Document Reviewed: 02/11/2014 Elsevier Interactive Patient Education 2016 Elsevier Inc.  

## 2015-01-11 NOTE — Therapy (Signed)
[facility] [address] [place], [place], [address] Phone: [phone]   Fax:  [phone]  Occupational Therapy Treatment  Patient Details  Name: Kyle Cameron MRN: [id number] Date of Birth: [birth year] Referring Provider: Dr. Jannifer Franklin  Encounter Date: 01/11/2015      OT End of Session - 01/11/15 1226    Visit Number 13   Number of Visits 17   Date for OT Re-Evaluation 01/21/15   Authorization Type 1Medicare, 2AARP, G-code needed   Authorization Time Period kx   Authorization - Visit Number 11   Authorization - Number of Visits 20   OT Start Time 1150   OT Stop Time 1230   OT Time Calculation (min) 40 min   Activity Tolerance Patient tolerated treatment well   Behavior During Therapy [facility] for tasks assessed/performed      Past Medical History  Diagnosis Date  . Hypertension   . High cholesterol   . Prostate atrophy   . GERD (gastroesophageal reflux disease)   . History of diverticulitis of colon   . Benign enlargement of prostate   . Hypothyroidism   . History of renal calculi   . Parkinson's disease ([place])   . Renal calculi   . CVA (cerebral infarction)   . Stroke ([place])   . Anticoagulated on Coumadin   . Headache   . Labyrinthitis   . Grover's disease   . Cataracts, bilateral   . Parkinson's disease ([place])   . Amaurosis fugax 08/16/2014    Right eye    Past Surgical History  Procedure Laterality Date  . Cholecystectomy  02/04/2012    Procedure: LAPAROSCOPIC CHOLECYSTECTOMY WITH INTRAOPERATIVE CHOLANGIOGRAM;  Surgeon: Merrie Roof, MD;  Location: [facility];  Service: General;  Laterality: N/A;  . Lithotripsy      renal calculi  . Tonsillectomy    . Cataract extraction, bilateral      There were no vitals filed for this visit.  Visit Diagnosis:  Rigidity  Cognitive deficits  Decreased functional mobility and endurance  Lack of coordination      Subjective Assessment - 01/11/15 1714     Pertinent History Pt with PD (diagnosed 3 years ago) with 2 CVAs  Pt reports PD worse after CVA.  Pt reports increased difficulty with speech, R hand, and walking.   Patient Stated Goals improve balance/ walking, use of RUE   Currently in Pain? No/denies        Therapist started checking progress towards goals in anticipation of d/c tomorrow. See long term goals for progress. Fine motor coordination activities, flipping cards and dealing with thumb with emphasis on larger amplitude movements. Pt continued to demonstrate significant freezing when amb. To waiting room after session , with limited response to cueing.                        OT Short Term Goals - 01/05/15 1337    OT SHORT TERM GOAL #1   Title Pt will be independent with updated HEP.--due 12/31/14   Time 4   Period Weeks   Status On-going  12/29/14 pt not perfoming at home and needs min-mod cues in clinic   OT [place] #2   Title Pt will improve coordination as shown by completing 9-hole peg test in 90 secs or less with dominant R hand.-   Baseline 102 secs   Time 4   Period Weeks   Status Partially Met  12/27/14  96.72sec;  Not fully met 01/05/15 110.50sec, 55.34sec (with cues for deliberate movement 2nd time)   OT SHORT TERM GOAL #3   Title Pt will be able to write at least 3 sentences with at least 50% legibility.   Time 4   Period Weeks   Status Achieved  12/27/14 Met at approx this level.   OT SHORT TERM GOAL #4   Title Pt will demonstrate ability to perfom RUE elbow extension at -10 for improved functional reach   Baseline baseline -18   Time 4   Period Weeks   Status Achieved  12/27/14 -15*; 01/05/15 -10*   OT SHORT TERM GOAL #5   Title ------------------------------------------------------------------------------------------------------------------------------------------           OT Long Term Goals - 01/11/15 1156    OT LONG TERM GOAL #1   Title Pt will verbalize  understanding of adaptive strategies for increased safety and independence with  ADLs/IADLs prn    Status On-going   OT LONG TERM GOAL #2   Title Pt will improve coordination as shown by completing 9-hole peg test in 60 secs or less with right dominant hand   Baseline RUE 54.50 on 01/11/15   Status Achieved   OT LONG TERM GOAL #3   Title Pt will demonstrate improved functional reaching/ coordination for ADLs as evidenced by improving box/ blocks score to 26 blocks for RUE.   Status Achieved   OT LONG TERM GOAL #4   Title Pt will demonstrate ability to retrieve lightweight items at 110 shoulder flexion with RUE.   Status Achieved   OT LONG TERM GOAL #5   Title Pt will demonstrate ability to perfrom PPT# 4(donning jacket in 25 secs or less without loss of balance)   Baseline 40.16 on 01/11/15   Status Not Met   OT LONG TERM GOAL #6   Title Pt will perform simple cooking/ home management in standing with supervision without LOB.   Status On-going               Plan - 01/11/15 1214    Clinical Impression Statement Pt demonstrates progress yet he is limited by freezing episodes.    Pt will benefit from skilled therapeutic intervention in order to improve on the following deficits (Retired) Decreased balance;Decreased knowledge of use of DME;Impaired UE functional use;Impaired perceived functional ability;Decreased strength;Decreased mobility;Decreased activity tolerance;Impaired tone;Impaired sensation;Decreased safety awareness;Decreased endurance;Decreased coordination   Rehab Potential Good   Clinical Impairments Affecting Rehab Potential speed and timing.   OT Frequency 2x / week   OT Duration 8 weeks   OT Treatment/Interventions Self-care/ADL training;Moist Heat;DME and/or AE instruction;Fluidtherapy;Splinting;Patient/family education;Balance training;Therapeutic exercises;Therapeutic exercise;Therapeutic activities;Functional Mobility Training;Neuromuscular  education;Cryotherapy;Energy conservation;Manual Therapy;Electrical Stimulation   Plan plan to d/c next session, finish checking goals   OT Home Exercise Plan Education issued:  PWR! supine (basic 4), PWR! hands, card coordination HEP   Consulted and Agree with Plan of Care Patient        Problem List Patient Active Problem List   Diagnosis Date Noted  . Essential (primary) hypertension 12/27/2014  . Swelling of limb 12/27/2014  . Gastro-esophageal reflux disease without esophagitis 12/27/2014  . 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 Hypothyroidism 12/27/2014  . Mixed hyperlipidemia 12/27/2014  . Parkinson's disease ([place]) 12/27/2014  . Sequelae of cerebral infarction 12/27/2014  . Amaurosis fugax 08/16/2014  . Long term current use of anticoagulant 07/08/2014  . Cerebral infarction due to embolism of left middle cerebral artery ([place]) 06/22/2014  .  Discoloration of skin of foot 03/15/2014  . PD (Parkinson's disease) ([place]) 03/15/2014  . Cerebral infarction due to embolism of cerebral artery ([place]) 03/15/2014  . Essential hypertension 03/15/2014  . HLD (hyperlipidemia) 03/15/2014  . Cough   . LV (left ventricular) mural thrombus ([place])   . CVA (cerebral infarction) 01/18/2014  . Imbalance 01/18/2014  . Slurring of speech   . Abnormality of gait 03/12/2012  . Paralysis agitans ([place]) 03/12/2012  . Acute cholecystitis 02/04/2012  . Hypertension 02/04/2012  . Hyperlipidemia 02/04/2012    Orien Mayhall 01/11/2015, 5:15 PM Theone Murdoch, OTR/L [phone] Phone: [phone] 5:15 PM 12/21/2016[facility] [address] [place], [place], [address] Phone: [phone]   Fax:  [phone]  Name: Kyle Cameron MRN: [id number] Date of Birth: [birth year]

## 2015-01-12 ENCOUNTER — Ambulatory Visit: Payer: Medicare Other

## 2015-01-12 ENCOUNTER — Ambulatory Visit: Payer: Medicare Other | Admitting: Occupational Therapy

## 2015-01-12 DIAGNOSIS — R269 Unspecified abnormalities of gait and mobility: Secondary | ICD-10-CM | POA: Diagnosis not present

## 2015-01-12 DIAGNOSIS — R2681 Unsteadiness on feet: Secondary | ICD-10-CM

## 2015-01-12 DIAGNOSIS — Z7409 Other reduced mobility: Secondary | ICD-10-CM

## 2015-01-12 DIAGNOSIS — R6889 Other general symptoms and signs: Secondary | ICD-10-CM

## 2015-01-12 DIAGNOSIS — R490 Dysphonia: Secondary | ICD-10-CM

## 2015-01-12 DIAGNOSIS — R4189 Other symptoms and signs involving cognitive functions and awareness: Secondary | ICD-10-CM

## 2015-01-12 NOTE — Therapy (Signed)
[facility] [address] [place], [place], [address] Phone: [phone]   Fax:  [phone]  Occupational Therapy Treatment  Patient Details  Name: Kyle Cameron MRN: [id number] Date of Birth: December 30, 1926 Referring Provider: Dr. Jannifer Franklin  Encounter Date: 01/12/2015      OT End of Session - 01/12/15 1324    Visit Number 14   Number of Visits 17   Date for OT Re-Evaluation 01/21/15   Authorization Type 1Medicare, 2AARP, G-code needed   Authorization Time Period kx   Authorization - Visit Number 14   Authorization - Number of Visits 20   OT Start Time 1318   OT Stop Time 1400   OT Time Calculation (min) 42 min   Activity Tolerance Patient tolerated treatment well   Behavior During Therapy [facility] for tasks assessed/performed      Past Medical History  Diagnosis Date  . Hypertension   . High cholesterol   . Prostate atrophy   . GERD (gastroesophageal reflux disease)   . History of diverticulitis of colon   . Benign enlargement of prostate   . Hypothyroidism   . History of renal calculi   . Parkinson's disease ([place])   . Renal calculi   . CVA (cerebral infarction)   . Stroke ([place])   . Anticoagulated on Coumadin   . Headache   . Labyrinthitis   . Grover's disease   . Cataracts, bilateral   . Parkinson's disease ([place])   . Amaurosis fugax 08/16/2014    Right eye    Past Surgical History  Procedure Laterality Date  . Cholecystectomy  02/04/2012    Procedure: LAPAROSCOPIC CHOLECYSTECTOMY WITH INTRAOPERATIVE CHOLANGIOGRAM;  Surgeon: Merrie Roof, MD;  Location: [facility];  Service: General;  Laterality: N/A;  . Lithotripsy      renal calculi  . Tonsillectomy    . Cataract extraction, bilateral      There were no vitals filed for this visit.  Visit Diagnosis:  Hypokinesia  Decreased functional mobility and endurance  Cognitive deficits  Unsteadiness      Subjective Assessment - 01/12/15 1323    Subjective  Pt reports that shot doesn't seem to make a difference    Pertinent History Pt with PD (diagnosed 3 years ago) with 2 CVAs  Pt reports PD worse after CVA.  Pt reports increased difficulty with speech, R hand, and walking.   Patient Stated Goals improve balance/ walking, use of RUE   Currently in Pain? No/denies                      OT Treatments/Exercises (OP) - 01/12/15 1636    ADLs   Overall ADLs Reviewed use of PWR! hands for coordination/grasping tasks snd pt returned demo with min cues.   Cooking Simulated kitchen tasks to use U-step walker to retrieve items in small area with freezing noted at almost every direction change; however, pt demo improved ability to compensate/overcome freezing episodes with strategies in decr amount of time.  Min cues provided intermittently.  Pt instructed that he may not be able to decr # of freezing episodes, but using strategies will decr time of each episode.  Pt verbalized understanding.   ADL Comments checked remaining goals and discussed progress.  Emphasized importance of HEP and recommended that pt be re-evaluated in approx 6 months by PT, OT, ST.  Pt verbalized agreement.                OT  Education - 01/12/15 1532    Education Details Reviewed/updated coordination HEP and emphasized importance to prevent future complications   Person(s) Educated Patient   Methods Explanation;Demonstration;Verbal cues;Handout   Comprehension Verbalized understanding;Returned demonstration;Verbal cues required          OT Short Term Goals - 01/12/15 1330    OT SHORT TERM GOAL #1   Title Pt will be independent with updated HEP.--due 12/31/14   Time 4   Period Weeks   Status Partially Met  12/29/14 pt not perfoming at home and needs min-mod cues in clinic;   01/12/15  continues to need cueing and reports not performing at home   OT [place] #2   Title Pt will improve coordination as shown by completing 9-hole peg test  in 90 secs or less with dominant R hand.-   Baseline 102 secs   Time 4   Period Weeks   Status Achieved  12/27/14  96.72sec;  Not fully met 01/05/15 110.50sec, 55.34sec (with cues for deliberate movement 2nd time); Met 01/11/15   OT SHORT TERM GOAL #3   Title Pt will be able to write at least 3 sentences with at least 50% legibility.   Time 4   Period Weeks   Status Achieved  12/27/14 Met at approx this level.   OT SHORT TERM GOAL #4   Title Pt will demonstrate ability to perfom RUE elbow extension at -10 for improved functional reach   Baseline baseline -18   Time 4   Period Weeks   Status Achieved  12/27/14 -15*; 01/05/15 -10*   OT SHORT TERM GOAL #5   Title ------------------------------------------------------------------------------------------------------------------------------------------           OT Long Term Goals - 01/12/15 1327    OT LONG TERM GOAL #1   Title Pt will verbalize understanding of adaptive strategies for increased safety and independence with  ADLs/IADLs prn    Status Partially Met  01/12/15:  Verbalizes understanding, but does not consistently demo carryover   OT LONG TERM GOAL #2   Title Pt will improve coordination as shown by completing 9-hole peg test in 60 secs or less with right dominant hand   Baseline RUE 54.50 on 01/11/15   Status Achieved   OT LONG TERM GOAL #3   Title Pt will demonstrate improved functional reaching/ coordination for ADLs as evidenced by improving box/ blocks score to 26 blocks for RUE.   Status Achieved   OT LONG TERM GOAL #4   Title Pt will demonstrate ability to retrieve lightweight items at 110 shoulder flexion with RUE.   Status Achieved   OT LONG TERM GOAL #5   Title Pt will demonstrate ability to perfrom PPT# 4(donning jacket in 25 secs or less without loss of balance)   Baseline 40.16 on 01/11/15   Status Not Met   OT LONG TERM GOAL #6   Title Pt will perform simple cooking/ home management in standing with  supervision without LOB.   Status Achieved  01/12/15:  Pt performs with multiple freezing episodes, but no LOB/falls, however, recommend supervision               Plan - 01/12/15 1534    Clinical Impression Statement Pt has made progress, but cognitive deficits, severity of deficits, and decr carryover of HEP has affected progress.   Plan d/c OT   OT Home Exercise Plan Education issued:  PWR! supine (basic 4), PWR! hands, card coordination HEP, updated coordination HEP 01/12/15  Consulted and Agree with Plan of Care Patient          G-Codes - Jan 29, 2015 1641    Functional Assessment Tool Used  9 hole peg test: RUE 54.50 secs, box/ blocks RUE 33 blocks   Functional Limitation Carrying, moving and handling objects   Carrying, Moving and Handling Objects Goal Status ([id number]) At least 20 percent but less than 40 percent impaired, limited or restricted   Carrying, Moving and Handling Objects Discharge Status [phone]) At least 20 percent but less than 40 percent impaired, limited or restricted      Problem List Patient Active Problem List   Diagnosis Date Noted  . Essential (primary) hypertension 12/27/2014  . Swelling of limb 12/27/2014  . Gastro-esophageal reflux disease without esophagitis 12/27/2014  . 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 Hypothyroidism 12/27/2014  . Mixed hyperlipidemia 12/27/2014  . Parkinson's disease ([place]) 12/27/2014  . Sequelae of cerebral infarction 12/27/2014  . Amaurosis fugax 08/16/2014  . Long term current use of anticoagulant 07/08/2014  . Cerebral infarction due to embolism of left middle cerebral artery ([place]) 06/22/2014  . Discoloration of skin of foot 03/15/2014  . PD (Parkinson's disease) ([place]) 03/15/2014  . Cerebral infarction due to embolism of cerebral artery ([place]) 03/15/2014  . Essential hypertension 03/15/2014  . HLD (hyperlipidemia) 03/15/2014  . Cough   . LV (left ventricular) mural thrombus ([place])   . CVA (cerebral  infarction) 01/18/2014  . Imbalance 01/18/2014  . Slurring of speech   . Abnormality of gait 03/12/2012  . Paralysis agitans ([place]) 03/12/2012  . Acute cholecystitis 02/04/2012  . Hypertension 02/04/2012  . Hyperlipidemia 02/04/2012    OCCUPATIONAL THERAPY DISCHARGE SUMMARY  Visits from Start of Care: 14  Current functional level related to goals / functional outcomes: See above   Remaining deficits: Hypokinesia, rigidity, decr functional mobility/balance with freezing, decr coordination, and cognitive deficits   Education / Equipment: Pt instructed in HEP, strategies for ADLs/IADLs for incr safety/ease/independence.  Pt verbalized understanding of all education provided.  Plan: Patient agrees to discharge.  Patient goals were partially met. Patient is being discharged due to                                                   reaching maximal rehab potential at this time.  Pt would benefit from re-evaluation in approx 6 months to assess for need for further therapy/functional changes due to progressive nature of diagnosis.   ?????       [facility] 29-Jan-2015, 4:48 PM  [facility] [address] [place], [place], [address] Phone: [phone]   Fax:  [phone]  Name: Kyle Cameron MRN: [id number] Date of Birth: 1926/09/16  Vianne Bulls, OTR/L 29-Jan-2015 4:48 PM

## 2015-01-12 NOTE — Patient Instructions (Signed)
PWR! Hands Start with elbows bent and hands closed.  PWR! Hands: Push hands out BIG. Elbows straight, wrists up, fingers open and spread apart BIG. (Can also perform by pushing down on table, chair, knees. Push above head, out to the side, behind you, in front of you.)   ** Make each movement big and deliberate so that you feel the movement.  Perform at least 10 repetitions 1x/day, but perform PWR! hands throughout the day when you are having trouble using your hands (picking up/manipulating small objects, writing, eating, typing, sewing, buttoning, etc.). Coordination Exercises  Perform the following exercises for 20  minutes 1 times per day. Perform with both hand(s). Perform using big movements.   Flipping Cards: Place deck of cards on the table. Flip cards over by opening your hand big to grasp and then turn your palm up big.  Deal cards: Hold 1/2 or whole deck in your hand. Use thumb to push card off top of deck with one big push.  Pick up coins and place in coin bank or container: Pick up with big, intentional movements opening hand big before attempting to pick up coing. Do not drag coin to the edge.

## 2015-01-12 NOTE — Patient Instructions (Signed)
Complete loud /a/ x5, twice a day.

## 2015-01-12 NOTE — Therapy (Addendum)
[facility] [address] [place], [place], [address] Phone: [phone]   Fax:  [phone]  Speech Language Pathology Treatment  Patient Details  Name: Kyle Cameron MRN: [id number] Date of Birth: 18-Oct-1926 Referring Provider: Jannifer Franklin, Dr. Lala Lund.  Encounter Date: 01/12/2015      End of Session - 01/12/15 1410    Visit Number 11   Number of Visits 17   Date for SLP Re-Evaluation 01/15/15   SLP Start Time 1404   SLP Stop Time  [id number]   SLP Time Calculation (min) 41 min      Past Medical History  Diagnosis Date  . Hypertension   . High cholesterol   . Prostate atrophy   . GERD (gastroesophageal reflux disease)   . History of diverticulitis of colon   . Benign enlargement of prostate   . Hypothyroidism   . History of renal calculi   . Parkinson's disease ([place])   . Renal calculi   . CVA (cerebral infarction)   . Stroke ([place])   . Anticoagulated on Coumadin   . Headache   . Labyrinthitis   . Grover's disease   . Cataracts, bilateral   . Parkinson's disease ([place])   . Amaurosis fugax 08/16/2014    Right eye    Past Surgical History  Procedure Laterality Date  . Cholecystectomy  02/04/2012    Procedure: LAPAROSCOPIC CHOLECYSTECTOMY WITH INTRAOPERATIVE CHOLANGIOGRAM;  Surgeon: Merrie Roof, MD;  Location: [facility];  Service: General;  Laterality: N/A;  . Lithotripsy      renal calculi  . Tonsillectomy    . Cataract extraction, bilateral      There were no vitals filed for this visit.  Visit Diagnosis: Hypokinetic Parkinsonian dysphonia      Subjective Assessment - 01/12/15 1409    Subjective Pt with inaudible greeting prior to SLP nonverbal cue.   Currently in Pain? No/denies               ADULT SLP TREATMENT - 01/12/15 1410    General Information   Behavior/Cognition Alert;Cooperative;Pleasant mood   Treatment Provided   Treatment provided Cognitive-Linquistic   Pain Assessment   Pain  Assessment No/denies pain   Cognitive-Linquistic Treatment   Treatment focused on Dysarthria   Skilled Treatment In order to recalibrate louder speech volume, In sentence-two sentence tasks pt maintained 70dB volume with rare min A. SLP had pt vocalize loud and long /a/ with average 86dB. In simple to mod complex conversation averaged 68dB over 10 minutes, with rare verbal and nonverbal cues. As conversation progressed past 10 minutes occasional cues necessary for breath support and loudness.     Assessment / Recommendations / Plan   Plan Continue with current plan of care   Progression Toward Goals   Progression toward goals Progressing toward goals            SLP Short Term Goals - 01/05/15 1319    SLP SHORT TERM GOAL #1   Title pt will complete loud /a/ with average 82dB over 4 sessions with rare min A   Status Achieved   SLP SHORT TERM GOAL #2   Title Pt will improve loudness in 5 minutes simple conversation to average 68dB over two sessions   Status Achieved          SLP Long Term Goals - 01/12/15 1445    SLP LONG TERM GOAL #1   Title pt will sustain loud /a/ across 5 reps at average 83dB over  four sessions   Time 5   Period Weeks   Status Achieved   SLP LONG TERM GOAL #2   Title pt will demo average 68dB in 8 minutes simple conversation over two sessions   Time 5   Period Weeks   Status Achieved   SLP LONG TERM GOAL #3   Title pt will achieve conversational speech intelligibility of 95% in min noisy environment over three sessions   Time 5   Period Weeks   Status On-going          Plan - 01/12/15 1446    Clinical Impression Statement Pt with min cues to maintain 69dB in simple conversation. Continue skilled ST to maximize intelligibility- pt to be d/c'd tomorrow.   Speech Therapy Frequency 2x / week   Duration --  3 weeks   Treatment/Interventions SLP instruction and feedback;Compensatory strategies;Internal/external aids;Patient/family education;Functional  tasks;Cueing hierarchy   Potential to Achieve Goals Fair   Potential Considerations Severity of impairments;Previous level of function        Problem List Patient Active Problem List   Diagnosis Date Noted  . Essential (primary) hypertension 12/27/2014  . Swelling of limb 12/27/2014  . Gastro-esophageal reflux disease without esophagitis 12/27/2014  . 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 Hypothyroidism 12/27/2014  . Mixed hyperlipidemia 12/27/2014  . Parkinson's disease ([place]) 12/27/2014  . Sequelae of cerebral infarction 12/27/2014  . Amaurosis fugax 08/16/2014  . Long term current use of anticoagulant 07/08/2014  . Cerebral infarction due to embolism of left middle cerebral artery ([place]) 06/22/2014  . Discoloration of skin of foot 03/15/2014  . PD (Parkinson's disease) ([place]) 03/15/2014  . Cerebral infarction due to embolism of cerebral artery ([place]) 03/15/2014  . Essential hypertension 03/15/2014  . HLD (hyperlipidemia) 03/15/2014  . Cough   . LV (left ventricular) mural thrombus ([place])   . CVA (cerebral infarction) 01/18/2014  . Imbalance 01/18/2014  . Slurring of speech   . Abnormality of gait 03/12/2012  . Paralysis agitans ([place]) 03/12/2012  . Acute cholecystitis 02/04/2012  . Hypertension 02/04/2012  . Hyperlipidemia 02/04/2012    [facility] , [place], CCC-SLP 01/12/2015, 2:47 PM  [facility] [address] [place], [place], [address] Phone: [phone]   Fax:  [phone]   Name: Durk Cropsey MRN: [id number] Date of Birth: 09/13/1926

## 2015-01-12 NOTE — Therapy (Signed)
[facility] [address] [place], [place], [address] Phone: [phone]   Fax:  [phone]  Physical Therapy Treatment  Patient Details  Name: Kyle Cameron MRN: [id number] Date of Birth: 1926/03/25 Referring Provider: Lesly Dukes  Encounter Date: 01/11/2015      PT End of Session - 01/12/15 1244    Visit Number 12   Number of Visits 17   Date for PT Re-Evaluation 01/15/15   Authorization Type Medicare G-code every 10th visit   PT Start Time 1318   PT Stop Time 1400   PT Time Calculation (min) 42 min   Equipment Utilized During Treatment Gait belt   Activity Tolerance Patient tolerated treatment well   Behavior During Therapy --  Pt demonstrates decreased awareness to PD-related deficits, stating he "plans to go on trip to [place] in the spring" and pt asks if he should return to driving      Past Medical History  Diagnosis Date  . Hypertension   . High cholesterol   . Prostate atrophy   . GERD (gastroesophageal reflux disease)   . History of diverticulitis of colon   . Benign enlargement of prostate   . Hypothyroidism   . History of renal calculi   . Parkinson's disease ([facility])   . Renal calculi   . CVA (cerebral infarction)   . Stroke ([facility])   . Anticoagulated on Coumadin   . Headache   . Labyrinthitis   . Grover's disease   . Cataracts, bilateral   . Parkinson's disease ([facility])   . Amaurosis fugax 08/16/2014    Right eye    Past Surgical History  Procedure Laterality Date  . Cholecystectomy  02/04/2012    Procedure: LAPAROSCOPIC CHOLECYSTECTOMY WITH INTRAOPERATIVE CHOLANGIOGRAM;  Surgeon: Robyne Askew, MD;  Location: [facility] OR;  Service: General;  Laterality: N/A;  . Lithotripsy      renal calculi  . Tonsillectomy    . Cataract extraction, bilateral      There were no vitals filed for this visit.  Visit Diagnosis:  Bradykinesia  Festinating gait  Abnormality of gait       Subjective Assessment - 01/11/15 1324    Subjective Tried taking the Apokyn shots and they don't seem to have any effect.  Had a fall reaching for a door several days ago.  Didnot get hurt.   Currently in Pain? No/denies                         [facility] Adult PT Treatment/Exercise - 01/12/15 1237    Transfers   Transfers Sit to Stand;Stand to Sit   Sit to Stand 5: Supervision   Stand to Sit 5: Supervision   Ambulation/Gait   Ambulation/Gait Yes   Ambulation/Gait Assistance 5: Supervision   Ambulation Distance (Feet) 786 Feet  in 6 minute walk test   Assistive device Other (Comment)  U-step RW   Gait Pattern Narrow base of support;Poor foot clearance - left;Poor foot clearance - right;Festinating   Ambulation Surface Level;Indoor   Gait velocity 15.72 sec= 2.09 ft/sec   Gait Comments Gait into therapy session from lobby, using U-step RW, with significant freezing epsiodes near doorway and in crowded hallway, freezing episodes with turning to sit.  Gait during session, 120 ftx 2 after 120 ft from lobby    Timed Up and Go Test   Normal TUG (seconds) 41.85  U-step RW     For larger areas, PT  provides cues for wide U-turns for improved mobility with turns.           PT Education - 01/12/15 1241    Education provided Yes   Education Details Fall prevention, POC/discharge plans for this visit   Person(s) Educated Patient   Methods Explanation;Handout   Comprehension Verbalized understanding          PT Short Term Goals - 12/21/14 1004    PT SHORT TERM GOAL #1   Title Pt will perform HEP with family/caregiver supervision for improved transfers, balance and gait.  TARGET 12/16/14   Time 4   Period Weeks   Status New   PT SHORT TERM GOAL #2   Title Pt will improve 5x sit<>stand to less than or equal to 15 seconds with no posterior lean for improved transfer efficiency and safety.   Baseline 19.3 sec 12/20/14, but with improved forward lean   Time 4    Period Weeks   Status Not Met   PT SHORT TERM GOAL #3   Title Pt will verbalize/demonstrate understanding of techniques to reduce freezing episodes with gait.   Baseline Pt able to verbalize, but not consistently able to demo.   Time 4   Period Weeks   Status Partially Met   PT SHORT TERM GOAL #4   Title Pt will be able to initiate gait within less than 30 seconds upon standing    Baseline Using U-step RW, pt able to initiate gait in 3.5 sec   Time 4   Period Weeks   Status Achieved           PT Long Term Goals - 01/11/15 1326    PT LONG TERM GOAL #1   Title Pt will verbalize/demonstrate understanding of fall prevention techniques within the home environment.  (TARGET 01/15/15)   Time 8   Period Weeks   Status Achieved   PT LONG TERM GOAL #2   Title Pt will improve gait velocity to at least 2.3 ft/sec for improved efficiency and safety with gait.   Time 8   Period Weeks   Status Not Met   PT LONG TERM GOAL #3   Title Pt will be able to complete TUG test in less than 2 minutes, to demonstrate improved short distance functional mobility within the home.   Baseline TUG 41.85 sec with U-step RW   Time 8   Period Weeks   Status Achieved   PT LONG TERM GOAL #4   Title Further assessment for assistive device to be completed for additional safety within home and community.   Baseline Pt obtained U-step RW, following trial of U-step RW during PT sessions.   Time 8   Period Weeks   Status Achieved   PT LONG TERM GOAL #5   Title Pt will verbalize plans for continued community fitness upon D/C from PT.   Baseline Pt verbalizes continuing to go to gym upon discharge.   Time 8   Period Weeks   Status Achieved               Plan - 01/12/15 1246    Clinical Impression Statement Pt has met LTG # 1, 3, 4, 5.  LTG # 2 not met for gait velocity.  In therapy sessions, pt demonstrates improvement in long distance walking ability and with improved TUG score using Ustep RW.   Despite some continued freezing, the U-step RW allows him improved ease of mobility, especially on long distance areas.  Pt continues  to report not walking as much at home and experiencing continued freezing episodes at home.  Pt has been instructed in various aspects of means to reduce freezing during the course of therapy.   Pt will benefit from skilled therapeutic intervention in order to improve on the following deficits Abnormal gait;Decreased activity tolerance;Decreased balance;Decreased mobility;Decreased strength;Difficulty walking;Postural dysfunction   Rehab Potential Good   PT Frequency 2x / week   PT Duration 8 weeks  plus eval   PT Treatment/Interventions ADLs/Self Care Home Management;Therapeutic exercise;Therapeutic activities;Functional mobility training;Gait training;DME Instruction;Balance training;Neuromuscular re-education;Patient/family education   PT Next Visit Plan Discharge this visit.   Consulted and Agree with Plan of Care Patient          G-Codes - 01/16/2015 1249    Functional Assessment Tool Used gait velocity 2.09 ft/sec, TUG 41.85 seconds with U-step RW   Functional Limitation Mobility: Walking and moving around   Mobility: Walking and Moving Around Goal Status [phone]) At least 20 percent but less than 40 percent impaired, limited or restricted   Mobility: Walking and Moving Around Discharge Status [phone]) At least 20 percent but less than 40 percent impaired, limited or restricted      Problem List Patient Active Problem List   Diagnosis Date Noted  . Essential (primary) hypertension 12/27/2014  . Swelling of limb 12/27/2014  . Gastro-esophageal reflux disease without esophagitis 12/27/2014  . 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 Hypothyroidism 12/27/2014  . Mixed hyperlipidemia 12/27/2014  . Parkinson's disease ([place]) 12/27/2014  . Sequelae of cerebral infarction 12/27/2014  . Amaurosis fugax 08/16/2014  . Long term current use of  anticoagulant 07/08/2014  . 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 Discoloration of skin of foot 03/15/2014  . PD (Parkinson's disease) ([place]) 03/15/2014  . Cerebral infarction due to embolism of cerebral artery ([place]) 03/15/2014  . Essential hypertension 03/15/2014  . HLD (hyperlipidemia) 03/15/2014  . Cough   . LV (left ventricular) mural thrombus ([place])   . CVA (cerebral infarction) 01/18/2014  . Imbalance 01/18/2014  . Slurring of speech   . Abnormality of gait 03/12/2012  . Paralysis agitans ([place]) 03/12/2012  . Acute cholecystitis 02/04/2012  . Hypertension 02/04/2012  . Hyperlipidemia 02/04/2012    Kensli Bowley W. 01/12/2015, 12:53 PM Frazier Butt., PT  [facility] [address] [place], [place], [address] Phone: [phone]   Fax:  [phone]  Name: Kyle Cameron MRN: [id number] Date of Birth: Aug 05, 1926   PHYSICAL THERAPY DISCHARGE SUMMARY  Visits from Start of Care: 12  Current functional level related to goals / functional outcomes: See above notes on STGs and LTGs.  Pt met 4 of 5 long term goals.   Remaining deficits: Pt continues to have significant freezing episodes, with occasional falls at home.   Education / Equipment: Pt has been instructed in HEP, fall prevention, and tips/strategies to reduce freezing episodes with gait.  Plan: Patient agrees to discharge.  Patient goals were partially met. Patient is being discharged due to meeting the stated rehab goals.  ?????        Mady Haagensen, PT 01/12/2015 12:56 PM Phone: [phone] Fax: [phone]

## 2015-01-13 ENCOUNTER — Ambulatory Visit: Payer: Medicare Other

## 2015-01-13 DIAGNOSIS — R269 Unspecified abnormalities of gait and mobility: Secondary | ICD-10-CM | POA: Diagnosis not present

## 2015-01-13 DIAGNOSIS — R490 Dysphonia: Secondary | ICD-10-CM

## 2015-01-13 DIAGNOSIS — R4701 Aphasia: Secondary | ICD-10-CM

## 2015-01-13 NOTE — Therapy (Signed)
[facility] [address] [place], [place], [address] Phone: [phone]   Fax:  [phone]  Speech Language Pathology Treatment  Patient Details  Name: Kyle Cameron MRN: [id number] Date of Birth: 11-26-1926 Referring Provider: Anne Hahn, Dr. Novella Olive.  Encounter Date: 01/13/2015      End of Session - 01/13/15 1357    Visit Number 12   Number of Visits 17   Date for SLP Re-Evaluation 01/15/15   SLP Start Time 1316   SLP Stop Time  1355   SLP Time Calculation (min) 39 min      Past Medical History  Diagnosis Date  . Hypertension   . High cholesterol   . Prostate atrophy   . GERD (gastroesophageal reflux disease)   . History of diverticulitis of colon   . Benign enlargement of prostate   . Hypothyroidism   . History of renal calculi   . Parkinson's disease ([facility])   . Renal calculi   . CVA (cerebral infarction)   . Stroke ([facility])   . Anticoagulated on Coumadin   . Headache   . Labyrinthitis   . Grover's disease   . Cataracts, bilateral   . Parkinson's disease ([facility])   . Amaurosis fugax 08/16/2014    Right eye    Past Surgical History  Procedure Laterality Date  . Cholecystectomy  02/04/2012    Procedure: LAPAROSCOPIC CHOLECYSTECTOMY WITH INTRAOPERATIVE CHOLANGIOGRAM;  Surgeon: Robyne Askew, MD;  Location: [facility] OR;  Service: General;  Laterality: N/A;  . Lithotripsy      renal calculi  . Tonsillectomy    . Cataract extraction, bilateral      There were no vitals filed for this visit.  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      Subjective Assessment - 01/13/15 1321    Subjective Pt with audible greeting to SLP "I left my cane here yesterday."               ADULT SLP TREATMENT - 01/13/15 1322    General Information   Behavior/Cognition Alert;Cooperative;Pleasant mood   Treatment Provided   Treatment provided Cognitive-Linquistic   Pain Assessment   Pain  Assessment No/denies pain   Cognitive-Linquistic Treatment   Treatment focused on Dysarthria   Skilled Treatment Average 86dB with loud /a/ in order to A in recalibraing loudness in conversation. With structured tasks to warm pt up for louder speech, he req'd mod A occasionally. In simple to mod complex conversation today pt req'd min-mod A occasionally, over 10 minutes; occasional mod cues necessary (verbal/visual cues) after 8-10 minutes conversation consistently. Pt with considerably greater frequency of dysfluency today than previous sessions.   Assessment / Recommendations / Plan   Plan Continue with current plan of care;Discharge SLP treatment due to (comment)  satisfied at current level and would like to break from ST   Progression Toward Goals   Progression toward goals --  Pt agrees with discharge today            SLP Short Term Goals - 01/05/15 1319    SLP SHORT TERM GOAL #1   Title pt will complete loud /a/ with average 82dB over 4 sessions with rare min A   Status Achieved   SLP SHORT TERM GOAL #2   Title Pt will improve loudness in 5 minutes simple conversation to average 68dB over two sessions   Status Achieved          SLP Long Term Goals - 01/13/15  [place] #1   Title pt will sustain loud /a/ across 5 reps at average 83dB over four sessions   Time 5   Period Weeks   Status Achieved   SLP LONG TERM GOAL #2   Title pt will demo average 68dB in 8 minutes simple conversation over two sessions   Time 5   Period Weeks   Status Achieved   SLP LONG TERM GOAL #3   Title pt will achieve conversational speech intelligibility of 95% in min noisy environment over three sessions   Time 5   Period Weeks   Status Not Met          Plan - February 01, 2015 1358    Clinical Impression Statement Pt with decr'd success today at all levels, likely due to fatigue (pt arrived from the gym, and had social event last night until 12 midnight). Pt will be discharged at  this time due to pleased with current level, and wanting to break from ST at this time as other therapies have discharged.   Speech Therapy Frequency --   Duration --   Treatment/Interventions --   Potential to Achieve Goals --   Potential Considerations Severity of impairments;Previous level of function          G-Codes - February 01, 2015 1402    Functional Assessment Tool Used NOMS   Functional Limitations Motor speech   Motor Speech Goal Status [phone]) At least 40 percent but less than 60 percent impaired, limited or restricted   Motor Speech Goal Status ([id number]) At least 40 percent but less than 60 percent impaired, limited or restricted     [place]  Visits from Start of Care: 12  Current functional level related to goals / functional outcomes: See above for goals met/unmet. Pt made most progress with loud /a/ loudness. Speech intelligibility/loudness was variable between sessions and was largely correlated to pt's fatigue level. Pt stated he is satisfied as much as he can be with current speaking abilities. He is more encouraged with it the days he is not as fatigued.   Remaining deficits: Deficits in speech intelligilbity complicated by fatigue and by lingering deficits from CVA earlier this year.   Education / Equipment: Loud "ah", need for consistency with loud "ah" at home, need to think "shout" with all verbalization  Plan: Patient agrees to discharge.  Patient goals were partially met. Patient is being discharged due to being pleased with the current functional level.  ????? and would like to break from ST at this time as other therapies have discharged as well.       Problem List Patient Active Problem List   Diagnosis Date Noted  . Essential (primary) hypertension 12/27/2014  . Swelling of limb 12/27/2014  . Gastro-esophageal reflux disease without esophagitis 12/27/2014  . 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 Hypothyroidism  12/27/2014  . Mixed hyperlipidemia 12/27/2014  . Parkinson's disease ([place]) 12/27/2014  . Sequelae of cerebral infarction 12/27/2014  . Amaurosis fugax 08/16/2014  . Long term current use of anticoagulant 07/08/2014  . Cerebral infarction due to embolism of left middle cerebral artery ([place]) 06/22/2014  . Discoloration of skin of foot 03/15/2014  . PD (Parkinson's disease) ([place]) 03/15/2014  . Cerebral infarction due to embolism of cerebral artery ([place]) 03/15/2014  . Essential hypertension 03/15/2014  . HLD (hyperlipidemia) 03/15/2014  . Cough   . LV (left ventricular) mural thrombus ([place])   . CVA (cerebral infarction) 01/18/2014  .  Imbalance 01/18/2014  . Slurring of speech   . Abnormality of gait 03/12/2012  . Paralysis agitans ([place]) 03/12/2012  . Acute cholecystitis 02/04/2012  . Hypertension 02/04/2012  . Hyperlipidemia 02/04/2012    SCHINKE,CARL , [place], CCC-SLP  01/13/2015, 2:02 PM  [facility] [address] [place], [place], [address] Phone: [phone]   Fax:  [phone]   Name: Kenson Groh MRN: [id number] Date of Birth: [birth year]

## 2015-01-13 NOTE — Patient Instructions (Signed)
Continue with your loud "ah" x5, twice a day.

## 2015-02-14 ENCOUNTER — Telehealth: Payer: Self-pay | Admitting: Neurology

## 2015-02-14 NOTE — Telephone Encounter (Signed)
Authorization for [employer]

## 2015-02-14 NOTE — Telephone Encounter (Signed)
Alexia with the [facility] is calling to get authorization for this patient for Rx Apokyn.  Please call @[phone] opt [phone].  Thanks!

## 2015-02-16 NOTE — Telephone Encounter (Signed)
I called the number provided and spoke to Kyle Cameron. She is going to re-fax the PA for this patient.

## 2015-02-16 NOTE — Telephone Encounter (Signed)
PA completed, signed and faxed back to number provided on form.

## 2015-04-06 ENCOUNTER — Telehealth: Payer: Self-pay | Admitting: Neurology

## 2015-04-06 NOTE — Telephone Encounter (Signed)
[facility]/Apokyn [id number] option 3 called regarding PA for APOKYN

## 2015-04-07 NOTE — Telephone Encounter (Signed)
I called and spoke to Kyle Cameron.  They have PA for coverage determination, but need PA for cost/quantity.  She will fax form.  She stated that they use a cartridge calculator relating to dose (0.36ml) up to max 5 times daily for Apokyn.  This would be 23 cartridges = 69ML for 30 days.

## 2015-04-13 ENCOUNTER — Other Ambulatory Visit: Payer: Self-pay | Admitting: Neurology

## 2015-04-19 ENCOUNTER — Encounter: Payer: Self-pay | Admitting: *Deleted

## 2015-04-19 NOTE — Telephone Encounter (Signed)
Have not received fax.  I called and spoke to victor.  He will fax to [phone].

## 2015-04-20 NOTE — Telephone Encounter (Signed)
Received form.  Completed.  Dr. Jannifer Franklin reviewed and signed.  Faxed confirmation today.  PA pending.

## 2015-04-24 NOTE — Telephone Encounter (Signed)
Approval.  PA: NA  Reviewed 04-22-15.  APOKYN  10mg /ml cartridge.  [facility] [phone]  [phone].  Pharmacy to run quanitiy using toltal milliliters.  QL only applies to >75ml/30day.  Dosing provided should be run as 45ML per 30 days.  Call [phone] [employer].  FAxed this to [facility] .

## 2015-04-25 ENCOUNTER — Telehealth: Payer: Self-pay | Admitting: Neurology

## 2015-04-25 NOTE — Telephone Encounter (Signed)
Kyle Cameron with [name] is calling and asking if the [employer] prior authorization paperwork for Rx Apokyn been completed for 23 cartridges for a 30 day supply. Fax [phone] and submit clinicals with request please. Phone # is [phone] opt 3.  Thanks!

## 2015-04-26 NOTE — Telephone Encounter (Signed)
I attempted to call again but busy.  I called and spoke to caregiver.  Pt has not gotten refill since 12/2014.  Pt has been given 0.3 and now 0.4 and this has not helped per caregiver.  Pt has appt on Friday, caregiver will be here as well.

## 2015-04-26 NOTE — Telephone Encounter (Signed)
Busy signal

## 2015-04-28 ENCOUNTER — Ambulatory Visit (INDEPENDENT_AMBULATORY_CARE_PROVIDER_SITE_OTHER): Payer: Medicare Other | Admitting: Neurology

## 2015-04-28 ENCOUNTER — Encounter: Payer: Self-pay | Admitting: Neurology

## 2015-04-28 VITALS — BP 116/64 | HR 88 | Ht 67.0 in | Wt 141.0 lb

## 2015-04-28 DIAGNOSIS — G20 Parkinson's disease: Secondary | ICD-10-CM | POA: Diagnosis not present

## 2015-04-28 DIAGNOSIS — R269 Unspecified abnormalities of gait and mobility: Secondary | ICD-10-CM | POA: Diagnosis not present

## 2015-04-28 MED ORDER — PRAMIPEXOLE DIHYDROCHLORIDE 0.25 MG PO TABS: 0.2500 mg | ORAL_TABLET | Freq: Three times a day (TID) | ORAL | Status: DC

## 2015-04-28 NOTE — Progress Notes (Signed)
Reason for visit: Parkinson's disease  Kyle Cameron is an 82 y.o. male  History of present illness:  Kyle Cameron is an 82 year old right-handed white male with a history of Parkinson's disease. The patient is on Sinemet taking the 25/250 mg tablet 3 times daily, and he is on selegiline 5 mg in the morning and at noon, and he is on low-dose Mirapex 0.125 milligrams 3 times daily. The patient has been on Apokinin, just recently taking 0.5 shots without much benefit. The patient indicates that he does not believe that he is really gained benefit at all with any medication for Parkinson's. He is having increasing problems with freezing with walking. The patient has had an occasional fall, he has not sustained any injury. He uses a walker for ambulation. He is reporting some mild memory problems. He is not sleeping well in part because of his bladder, he has urinary frequency and urgency in the evening. He feels fatigued later in the day. The patient is exercising, trying to walk on a regular basis. As long as he can walk straight away, he has no problems. He continues to have some hoarseness and decreased phonation with the voice, he reports that solids get stuck in his throat at times.  Past Medical History  Diagnosis Date  . Hypertension   . High cholesterol   . Prostate atrophy   . GERD (gastroesophageal reflux disease)   . History of diverticulitis of colon   . Benign enlargement of prostate   . Hypothyroidism   . History of renal calculi   . Parkinson's disease ([place])   . Renal calculi   . CVA (cerebral infarction)   . Stroke ([place])   . Anticoagulated on Coumadin   . Headache   . Labyrinthitis   . Grover's disease   . Cataracts, bilateral   . Parkinson's disease ([place])   . Amaurosis fugax 08/16/2014    Right eye    Past Surgical History  Procedure Laterality Date  . Cholecystectomy  02/04/2012    Procedure: LAPAROSCOPIC CHOLECYSTECTOMY WITH INTRAOPERATIVE CHOLANGIOGRAM;   Surgeon: Merrie Roof, MD;  Location: [place];  Service: General;  Laterality: N/A;  . Lithotripsy      renal calculi  . Tonsillectomy    . Cataract extraction, bilateral      Family History  Problem Relation Age of Onset  . Parkinsonism Mother   . Heart disease Father   . Esophageal cancer Sister     Social history:  reports that he has quit smoking. He has never used smokeless tobacco. He reports that he drinks about 4.2 oz of alcohol per week. He reports that he does not use illicit drugs.    Allergies  Allergen Reactions  . Ace Inhibitors Other (See Comments)    Medications:  Prior to Admission medications   Medication Sig Start Date End Date Taking? Authorizing Provider  acetaminophen (TYLENOL) 500 MG tablet Take 500 mg by mouth every 8 (eight) hours as needed for mild pain or moderate pain.   Yes Historical Provider, MD  APOMORPHINE HYDROCHLORIDE 10 MG/ML SOLN Inject 0.2-0.6 mLs (2-6 mg total) into the skin as needed (Max dose of 0.16mL five times daily). 12/25/14  Yes Kathrynn Ducking, MD  atorvastatin (LIPITOR) 20 MG tablet Take 20 mg by mouth daily. 07/20/14  Yes Historical Provider, MD  carbidopa-levodopa (SINEMET) 25-250 MG tablet Take 1 tablet by mouth 3 (three) times daily. 12/27/14  Yes Kathrynn Ducking, MD  cholecalciferol (VITAMIN D)  1000 UNITS tablet Take 1,000 Units by mouth daily.   Yes Historical Provider, MD  dutasteride (AVODART) 0.5 MG capsule Take 0.5 mg by mouth every other day.   Yes Historical Provider, MD  levothyroxine (SYNTHROID, LEVOTHROID) 50 MCG tablet Take 50 mcg by mouth daily before breakfast.   Yes Historical Provider, MD  losartan-hydrochlorothiazide (HYZAAR) 100-25 MG per tablet Take 1 tablet by mouth daily.   Yes Historical Provider, MD  pramipexole (MIRAPEX) 0.125 MG tablet ONE TABLET THREE TIMES A DAY FOR 2 WEEKS, THEN TAKE 2 TABLETS THREE TIMES A DAY 01/04/15  Yes Kathrynn Ducking, MD  selegiline (ELDEPRYL) 5 MG tablet TAKE ONE TABLET BY MOUTH  TWICE DAILY WITH MEALS (TAKE WITH BREAKFAST AND LUNCH) 04/14/15  Yes Kathrynn Ducking, MD  warfarin (COUMADIN) 5 MG tablet Take 5 mg by mouth daily. 5mg  Mon.-Fri, 7.5mg  Sat & Sun   Yes Historical Provider, MD  trimethobenzamide (TIGAN) 300 MG capsule Take 1 capsule (300 mg total) by mouth 3 (three) times daily. Patient not taking: Reported on 04/28/2015 10/14/14   Kathrynn Ducking, MD    ROS:  Out of a complete 14 system review of symptoms, the patient complains only of the following symptoms, and all other reviewed systems are negative.  Gait disorder Memory problems  Blood pressure 116/64, pulse 88, height 5\' 7"  (1.702 m), weight 141 lb (63.957 kg).  Physical Exam  General: The patient is alert and cooperative at the time of the examination.  Skin: No significant peripheral edema is noted.   Neurologic Exam  Mental status: The patient is alert and oriented x 3 at the time of the examination. The patient has apparent normal recent and remote memory, with an apparently normal attention span and concentration ability.   Cranial nerves: Facial symmetry is present. Speech is dysphonic, decreased amplitude, hoarse. Extraocular movements are full. Visual fields are full.  Motor: The patient has good strength in all 4 extremities.  Sensory examination: Soft touch sensation is symmetric on the face, arms, and legs.  Coordination: The patient has good finger-nose-finger and heel-to-shin bilaterally.  Gait and station: The patient walks with a walker, he has good stride with walking straight, with turns, he has significant freezing, shuffling of the feet. He has difficulty initiating ambulation. Tandem gait was not attempted. Romberg is negative.  Reflexes: Deep tendon reflexes are symmetric.   MRI brain 01/18/14:  IMPRESSION: 1. Acute/subacute infarct in the posterior left frontal lobe is confirmed. There is evidence of cortical laminar necrosis without definite hemorrhage. 2.  Acute/subacute infarct within the posterolateral left cerebellum. 3. More acute linear area of infarction within the anteromedial superior left cerebellum. 4. Other scattered white matter disease is present bilaterally and within the brainstem. 5. Mild sinus disease.  * MRI scan images were reviewed online. I agree with the written report.    Assessment/Plan:  1. Parkinson's disease  2. Gait disorder  The patient has gained essentially no benefit from the [place]. He will stop the medication. We will go up on the Mirapex taking 0.25 mg 3 times daily. I will send him to [facility] for a second opinion regarding his Parkinson's disease, the patient is not clear that he ever gained any benefit with any medication used for his parkinsonism. He will follow-up otherwise in 5 months.  Jill Alexanders MD 04/28/2015 5:22 PM  [facility] [address] [place], [place] [address]  Phone [phone] Fax [phone]

## 2015-04-28 NOTE — Telephone Encounter (Signed)
[facility]/Apokyn Program [phone] opt 3 called sts coverage determination Apokyn by [facility] the qty approved is only 4 < 60ml per 30days.  The pt is prescribed 42ml/ 30day supply. Please call to move forward with this. Thank you

## 2015-04-28 NOTE — Telephone Encounter (Signed)
Pt has appt today, will call after this.

## 2015-04-28 NOTE — Telephone Encounter (Signed)
I called and spoke to [place] and relayed per Dr. Tobey Grim note that pt will stop the APOKYN.

## 2015-05-08 ENCOUNTER — Telehealth: Payer: Self-pay | Admitting: Neurology

## 2015-05-08 DIAGNOSIS — G20 Parkinson's disease: Secondary | ICD-10-CM

## 2015-05-08 NOTE — Telephone Encounter (Signed)
[name], pt's care giver called about referral to Dr. Linus Mako at [facility], Movement specialist. They do not have an opening for another 5 months. Is there somewhere else they can be referred to?  Please call [phone]

## 2015-05-08 NOTE — Telephone Encounter (Signed)
I called the patient. The patient does not want to wait 5 months to see Dr. Linus Mako at [facility], I will try Dr. Jennelle Human at [facility].

## 2015-05-08 NOTE — Telephone Encounter (Signed)
Called Dr. Donna Christen office to check on sooner appt. Advised that pt is on waiting list for an earlier appt @ [facility].

## 2015-05-08 NOTE — Addendum Note (Signed)
Addended by: Margette Fast on: 05/08/2015 05:22 PM   Modules accepted: Orders

## 2015-06-04 ENCOUNTER — Encounter (HOSPITAL_COMMUNITY): Payer: Self-pay | Admitting: *Deleted

## 2015-06-04 ENCOUNTER — Emergency Department (HOSPITAL_COMMUNITY)
Admission: EM | Admit: 2015-06-04 | Discharge: 2015-06-04 | Disposition: A | Discharge status: Home / Self Care | Payer: Medicare Other | Attending: Emergency Medicine | Admitting: Emergency Medicine

## 2015-06-04 ENCOUNTER — Emergency Department (HOSPITAL_COMMUNITY): Payer: Medicare Other

## 2015-06-04 DIAGNOSIS — Y998 Other external cause status: Secondary | ICD-10-CM | POA: Insufficient documentation

## 2015-06-04 DIAGNOSIS — Z043 Encounter for examination and observation following other accident: Secondary | ICD-10-CM | POA: Insufficient documentation

## 2015-06-04 DIAGNOSIS — Z8673 Personal history of transient ischemic attack (TIA), and cerebral infarction without residual deficits: Secondary | ICD-10-CM | POA: Insufficient documentation

## 2015-06-04 DIAGNOSIS — I10 Essential (primary) hypertension: Secondary | ICD-10-CM | POA: Diagnosis not present

## 2015-06-04 DIAGNOSIS — Z7901 Long term (current) use of anticoagulants: Secondary | ICD-10-CM | POA: Diagnosis not present

## 2015-06-04 DIAGNOSIS — Y9289 Other specified places as the place of occurrence of the external cause: Secondary | ICD-10-CM | POA: Diagnosis not present

## 2015-06-04 DIAGNOSIS — Y9389 Activity, other specified: Secondary | ICD-10-CM | POA: Diagnosis not present

## 2015-06-04 DIAGNOSIS — Z79899 Other long term (current) drug therapy: Secondary | ICD-10-CM | POA: Insufficient documentation

## 2015-06-04 DIAGNOSIS — K5900 Constipation, unspecified: Secondary | ICD-10-CM | POA: Diagnosis present

## 2015-06-04 DIAGNOSIS — E039 Hypothyroidism, unspecified: Secondary | ICD-10-CM | POA: Insufficient documentation

## 2015-06-04 DIAGNOSIS — W1830XA Fall on same level, unspecified, initial encounter: Secondary | ICD-10-CM | POA: Insufficient documentation

## 2015-06-04 DIAGNOSIS — Z87442 Personal history of urinary calculi: Secondary | ICD-10-CM | POA: Diagnosis not present

## 2015-06-04 DIAGNOSIS — E7800 Pure hypercholesterolemia, unspecified: Secondary | ICD-10-CM | POA: Insufficient documentation

## 2015-06-04 DIAGNOSIS — W19XXXA Unspecified fall, initial encounter: Secondary | ICD-10-CM

## 2015-06-04 DIAGNOSIS — G20 Parkinson's disease: Secondary | ICD-10-CM | POA: Diagnosis not present

## 2015-06-04 DIAGNOSIS — N400 Enlarged prostate without lower urinary tract symptoms: Secondary | ICD-10-CM | POA: Diagnosis not present

## 2015-06-04 DIAGNOSIS — Z87891 Personal history of nicotine dependence: Secondary | ICD-10-CM | POA: Diagnosis not present

## 2015-06-04 MED ORDER — MILK AND MOLASSES ENEMA
1.0000 | Freq: Once | RECTAL | Status: DC
  Filled 2015-06-04: qty 250

## 2015-06-04 MED ORDER — SENNOSIDES-DOCUSATE SODIUM 8.6-50 MG PO TABS: 2.0000 | ORAL_TABLET | Freq: Every day | ORAL | Status: DC

## 2015-06-04 NOTE — Discharge Instructions (Signed)
For the next week please use all medication as directed, and be sure to follow-up with your primary care physician.  Return here for concerning changes in your condition.   Constipation, Adult Constipation is when a person has fewer than three bowel movements a week, has difficulty having a bowel movement, or has stools that are dry, hard, or larger than normal. As people grow older, constipation is more common. A low-fiber diet, not taking in enough fluids, and taking certain medicines may make constipation worse.  CAUSES   Certain medicines, such as antidepressants, pain medicine, iron supplements, antacids, and water pills.   Certain diseases, such as diabetes, irritable bowel syndrome (IBS), thyroid disease, or depression.   Not drinking enough water.   Not eating enough fiber-rich foods.   Stress or travel.   Lack of physical activity or exercise.   Ignoring the urge to have a bowel movement.   Using laxatives too much.  SIGNS AND SYMPTOMS   Having fewer than three bowel movements a week.   Straining to have a bowel movement.   Having stools that are hard, dry, or larger than normal.   Feeling full or bloated.   Pain in the lower abdomen.   Not feeling relief after having a bowel movement.  DIAGNOSIS  Your health care provider will take a medical history and perform a physical exam. Further testing may be done for severe constipation. Some tests may include:  A barium enema X-ray to examine your rectum, colon, and, sometimes, your small intestine.   A sigmoidoscopy to examine your lower colon.   A colonoscopy to examine your entire colon. TREATMENT  Treatment will depend on the severity of your constipation and what is causing it. Some dietary treatments include drinking more fluids and eating more fiber-rich foods. Lifestyle treatments may include regular exercise. If these diet and lifestyle recommendations do not help, your health care provider  may recommend taking over-the-counter laxative medicines to help you have bowel movements. Prescription medicines may be prescribed if over-the-counter medicines do not work.  HOME CARE INSTRUCTIONS   Eat foods that have a lot of fiber, such as fruits, vegetables, whole grains, and beans.  Limit foods high in fat and processed sugars, such as french fries, hamburgers, cookies, candies, and soda.   A fiber supplement may be added to your diet if you cannot get enough fiber from foods.   Drink enough fluids to keep your urine clear or pale yellow.   Exercise regularly or as directed by your health care provider.   Go to the restroom when you have the urge to go. Do not hold it.   Only take over-the-counter or prescription medicines as directed by your health care provider. Do not take other medicines for constipation without talking to your health care provider first.  [place] IF:   You have bright red blood in your stool.   Your constipation lasts for more than 4 days or gets worse.   You have abdominal or rectal pain.   You have thin, pencil-like stools.   You have unexplained weight loss. MAKE SURE YOU:   Understand these instructions.  Will watch your condition.  Will get help right away if you are not doing well or get worse.   This information is not intended to replace advice given to you by your health care provider. Make sure you discuss any questions you have with your health care provider.   Document Released: 10/06/2003 Document Revised: 01/28/2014  Document Reviewed: 10/19/2012 Elsevier Interactive Patient Education [employer].

## 2015-06-04 NOTE — ED Provider Notes (Signed)
CSN: [id number]     Arrival date & time 06/04/15  1249 History   First MD Initiated Contact with Patient 06/04/15 1610     Chief Complaint  Patient presents with  . Constipation     HPI  Patient presented with his son who assists with history of present illness. Patient has a history of Parkinson disease. Over the past 4 days the patient has had no stool production. This is an unusual for him. No very recent medication changes, diet changes in activity changes, but he has had change in his carbidopa about 6 weeks ago. Over the past 4 days he has had mild lower abdominal discomfort, but more rectal discomfort, with inability to have substantial bowel movement. Patient has used OTC medication, and one enema, with no relief. Patient continues to eat, drink.   Past Medical History  Diagnosis Date  . Hypertension   . High cholesterol   . Prostate atrophy   . GERD (gastroesophageal reflux disease)   . History of diverticulitis of colon   . Benign enlargement of prostate   . Hypothyroidism   . History of renal calculi   . Parkinson's disease ([place])   . Renal calculi   . CVA (cerebral infarction)   . Stroke ([place])   . Anticoagulated on Coumadin   . Headache   . Labyrinthitis   . Grover's disease   . Cataracts, bilateral   . Parkinson's disease ([place])   . Amaurosis fugax 08/16/2014    Right eye   Past Surgical History  Procedure Laterality Date  . Cholecystectomy  02/04/2012    Procedure: LAPAROSCOPIC CHOLECYSTECTOMY WITH INTRAOPERATIVE CHOLANGIOGRAM;  Surgeon: Merrie Roof, MD;  Location: [place];  Service: General;  Laterality: N/A;  . Lithotripsy      renal calculi  . Tonsillectomy    . Cataract extraction, bilateral     Family History  Problem Relation Age of Onset  . Parkinsonism Mother   . Heart disease Father   . Esophageal cancer Sister    Social History  Substance Use Topics  . Smoking status: Former Research scientist (life sciences)  . Smokeless tobacco: Never Used  . Alcohol Use: 4.2  oz/week    0 Standard drinks or equivalent, 7 Glasses of wine per week     Comment: Consumes 1 glass of wine daily    Review of Systems    Allergies  Ace inhibitors  Home Medications   Prior to Admission medications   Medication Sig Start Date End Date Taking? Authorizing Provider  acetaminophen (TYLENOL) 325 MG tablet Take 650 mg by mouth 2 (two) times daily as needed for mild pain.   Yes Historical Provider, MD  atorvastatin (LIPITOR) 20 MG tablet Take 20 mg by mouth daily at 6 PM.  07/20/14  Yes Historical Provider, MD  carbidopa-levodopa (SINEMET) 25-250 MG tablet Take 1 tablet by mouth 3 (three) times daily. 12/27/14  Yes Kathrynn Ducking, MD  cholecalciferol (VITAMIN D) 1000 UNITS tablet Take 1,000 Units by mouth daily.   Yes Historical Provider, MD  dutasteride (AVODART) 0.5 MG capsule Take 0.5 mg by mouth every morning.    Yes Historical Provider, MD  levothyroxine (SYNTHROID, LEVOTHROID) 50 MCG tablet Take 50 mcg by mouth daily before breakfast.   Yes Historical Provider, MD  losartan-hydrochlorothiazide (HYZAAR) 100-25 MG per tablet Take 1 tablet by mouth daily.   Yes Historical Provider, MD  pramipexole (MIRAPEX) 0.25 MG tablet Take 1 tablet (0.25 mg total) by mouth 3 (three) times daily. Patient  taking differently: Take 0.5 mg by mouth 3 (three) times daily.  04/28/15  Yes Kathrynn Ducking, MD  selegiline (ELDEPRYL) 5 MG tablet TAKE ONE TABLET BY MOUTH TWICE DAILY WITH MEALS (TAKE WITH BREAKFAST AND LUNCH) 04/14/15  Yes Kathrynn Ducking, MD  warfarin (COUMADIN) 5 MG tablet Take 5 mg by mouth daily at 6 PM.    Yes Historical Provider, MD  senna-docusate (SENOKOT-S) 8.6-50 MG tablet Take 2 tablets by mouth daily. 06/04/15   Carmin Muskrat, MD   BP 106/65 mmHg  Pulse 83  Temp(Src) 98.2 F (36.8 C) (Oral)  Resp 16  Ht 5\' 6"  (1.676 m)  Wt 140 lb (63.504 kg)  BMI 22.61 kg/m2  SpO2 98% Physical Exam  Constitutional: He is oriented to person, place, and time. He has a sickly  appearance.  HENT:  Head: Normocephalic and atraumatic.  Eyes: Conjunctivae and EOM are normal.  Cardiovascular: Normal rate and regular rhythm.   Pulmonary/Chest: Effort normal. No stridor. No respiratory distress.  Abdominal: Soft. Normal appearance. He exhibits no distension.    Genitourinary:     Musculoskeletal: He exhibits no edema.  Neurological: He is alert and oriented to person, place, and time. He displays atrophy and tremor.  Skin: Skin is warm and dry.  Psychiatric: Cognition and memory are impaired.  Nursing note and vitals reviewed.   ED Course  Procedures (including critical care time) Labs Review Labs Reviewed - No data to display  Imaging Review Dg Sacrum/coccyx  06/04/2015  CLINICAL DATA:  Fall.  Pain EXAM: SACRUM AND COCCYX - 2+ VIEW COMPARISON:  02/03/2012 FINDINGS: There is no evidence of fracture or other focal bone lesions. IMPRESSION: Negative. Electronically Signed   By: Franchot Gallo M.D.   On: 06/04/2015 18:22   I have personally reviewed and evaluated these images and lab results as part of my medical decision-making.   EKG Interpretation None     8:27 PM Patient has had several small soft bowel movements.  I discussed patient's bowel habits with him and his son. Patient has not yet received enema here, as equipment has not been available. With ongoing stooling, no indication for additional intervention. Patient will be started on a home regimen for assistance, will follow up with primary care.  MDM   Final diagnoses:  Constipation, unspecified constipation type  Fall, initial encounter   Elderly male presents several days of decreased bowel movements. On exam he is awake and alert, hemodynamically stable, with a soft, non-peritoneal abdomen. No evidence for mass, and on initial evaluation there is stool palpable in the rectal vault.  The patient does have production of multiple soft stools, though this is likely result of therapy  provided at home. Patient also had x-ray performed due to recent fall, this does not show abnormality of the coccyx, or mass. Patient discharged in stable condition with new home bowel regimen.  Carmin Muskrat, MD 06/04/15 2031

## 2015-06-04 NOTE — ED Notes (Signed)
Patient transported to X-ray 

## 2015-06-04 NOTE — ED Notes (Signed)
Pt stats he has not had a bm since Tuesday.  Has tried dulcolax and enemas with no relief.  Son states pt is impacted.  Pt denies nausea and states he has still been able to eat.

## 2015-06-27 ENCOUNTER — Other Ambulatory Visit: Payer: Self-pay | Admitting: Neurology

## 2015-07-13 ENCOUNTER — Other Ambulatory Visit: Payer: Self-pay | Admitting: Neurology

## 2015-07-13 ENCOUNTER — Telehealth: Payer: Self-pay | Admitting: Neurology

## 2015-07-13 DIAGNOSIS — G20 Parkinson's disease: Secondary | ICD-10-CM

## 2015-07-13 NOTE — Telephone Encounter (Signed)
Pts caregiver, Norm Parcel and says pt needs speech therapist and/or a mod barium test. He is trying to coordinate these two and needs some help. Please call and advise [phone]

## 2015-07-14 ENCOUNTER — Other Ambulatory Visit (HOSPITAL_COMMUNITY): Payer: Self-pay | Admitting: Internal Medicine

## 2015-07-14 DIAGNOSIS — R1310 Dysphagia, unspecified: Secondary | ICD-10-CM

## 2015-07-14 NOTE — Telephone Encounter (Signed)
Returned call and spoke to [place]. He reports that Modified Barium Swallow @ [facility] (ordered by Dr. Delfina Redwood) and PT/OT/SLP eval @ [facility] (ordered by Dr. Jannifer Franklin) are scheduled for the same day and time. Called therapy office @ [phone] but unable to reach anyone at this time. Will try to call again on Monday. [name] verbalized understanding and appreciation for call.

## 2015-07-17 NOTE — Telephone Encounter (Signed)
Called and spoke to scheduler at therapy office. She agreed to check w/ speech and will call back if therapy eval appts need to be rescheduled.

## 2015-07-17 NOTE — Telephone Encounter (Signed)
Corinne Ports called back asking for a call back [phone]

## 2015-07-17 NOTE — Telephone Encounter (Signed)
Talked to Corinne Ports again @ [facility]. She states that usual scheduler is out of the office this week. However, normal appt time for modified barium swallow is 1 pm. Pt has therapy evals starting @ 1:15. It looks as if procedure was rescheduled @ an earlier time (12:30 rather than 1 pm) so that pt might make both appts on that day. Corinne Ports says that MBS could be scheduled for another day next week @ 1 pm if desired. Called and left VM for caregiver, [name]. May call back if further rescheduling is needed.

## 2015-07-17 NOTE — Telephone Encounter (Signed)
Caregiver, [name], called back. Says that pt has lost 17+ lbs and barium swallow does not need to be post-poned any longer. Would like to reschedule PT appt instead. Called [facility] back, no answer, left mssg to call back.

## 2015-07-18 NOTE — Telephone Encounter (Signed)
Spoke to [employer] who offered PT eval @ 10:15 prior to barium swallow. However, caregiver, [name], declined saying he would rather reschedule all 3 evals to another day. Angie agreed to call [name] back to reschedule w/ him directly.

## 2015-07-18 NOTE — Telephone Encounter (Addendum)
Called outpt rehab as suggested to reschedule PT/OT and/or SLP eval d/t conflict w/ previously scheduled barium swallow. No answer, left VM mssg to please return call or reach pt's caregiver, [name], @ [phone] to re-schedule rehab appts.

## 2015-07-18 NOTE — Telephone Encounter (Signed)
Corinne Ports called to speak with nurse. I skyped nurse who was unable to come to the phone. The nurse mentioned pt's caregiver wants to continue with barium swallow but to r/s PT. Corinne Ports suggest calling their office [phone] to r/s that appt.

## 2015-07-20 ENCOUNTER — Ambulatory Visit: Payer: Medicare Other

## 2015-07-20 ENCOUNTER — Ambulatory Visit: Payer: Medicare Other | Admitting: Physical Therapy

## 2015-07-20 ENCOUNTER — Ambulatory Visit: Payer: Medicare Other | Attending: Neurology | Admitting: Occupational Therapy

## 2015-07-20 ENCOUNTER — Other Ambulatory Visit (HOSPITAL_COMMUNITY): Payer: Medicare Other

## 2015-07-20 ENCOUNTER — Ambulatory Visit (HOSPITAL_COMMUNITY): Payer: Medicare Other

## 2015-07-20 DIAGNOSIS — R4701 Aphasia: Secondary | ICD-10-CM | POA: Insufficient documentation

## 2015-07-20 DIAGNOSIS — R482 Apraxia: Secondary | ICD-10-CM

## 2015-07-20 DIAGNOSIS — R278 Other lack of coordination: Secondary | ICD-10-CM | POA: Diagnosis present

## 2015-07-20 DIAGNOSIS — R29818 Other symptoms and signs involving the nervous system: Secondary | ICD-10-CM

## 2015-07-20 DIAGNOSIS — R471 Dysarthria and anarthria: Secondary | ICD-10-CM | POA: Diagnosis present

## 2015-07-20 DIAGNOSIS — R2689 Other abnormalities of gait and mobility: Secondary | ICD-10-CM

## 2015-07-20 DIAGNOSIS — M25611 Stiffness of right shoulder, not elsewhere classified: Secondary | ICD-10-CM

## 2015-07-20 DIAGNOSIS — R29898 Other symptoms and signs involving the musculoskeletal system: Secondary | ICD-10-CM | POA: Diagnosis present

## 2015-07-20 DIAGNOSIS — M25621 Stiffness of right elbow, not elsewhere classified: Secondary | ICD-10-CM | POA: Diagnosis present

## 2015-07-20 DIAGNOSIS — R1310 Dysphagia, unspecified: Secondary | ICD-10-CM | POA: Insufficient documentation

## 2015-07-20 NOTE — Patient Instructions (Signed)
  Small bites and sips  Hold liquid in mouth for 1/2 second and "prepare" to swallow - SWALLOW HARD!  Alternate bite - sip - bite - sip - bite - sip  Swallow once or twice more, when you feel your mouth is empty   =========================================================== Hard swallow, 20 times 2-3 times a day

## 2015-07-20 NOTE — Therapy (Signed)
[facility] [address] [place], [place], [address] Phone: [phone]   Fax:  [phone]  Speech Language Pathology Evaluation  Patient Details  Name: Kyle Cameron MRN: [id number] Date of Birth: [birth year] Referring Provider: Jannifer Franklin, Dr. Lala Lund.  Encounter Date: 07/20/2015      End of Session - 07/20/15 1623    Visit Number 1   Number of Visits 17   Date for SLP Re-Evaluation 09/22/15   SLP Start Time 1450   SLP Stop Time  1535   SLP Time Calculation (min) 45 min   Activity Tolerance Patient tolerated treatment well      Past Medical History  Diagnosis Date  . Hypertension   . High cholesterol   . Prostate atrophy   . GERD (gastroesophageal reflux disease)   . History of diverticulitis of colon   . Benign enlargement of prostate   . Hypothyroidism   . History of renal calculi   . Parkinson's disease ([place])   . Renal calculi   . CVA (cerebral infarction)   . Stroke ([place])   . Anticoagulated on Coumadin   . Headache   . Labyrinthitis   . Grover's disease   . Cataracts, bilateral   . Parkinson's disease ([place])   . Amaurosis fugax 08/16/2014    Right eye    Past Surgical History  Procedure Laterality Date  . Cholecystectomy  02/04/2012    Procedure: LAPAROSCOPIC CHOLECYSTECTOMY WITH INTRAOPERATIVE CHOLANGIOGRAM;  Surgeon: Merrie Roof, MD;  Location: [facility];  Service: General;  Laterality: N/A;  . Lithotripsy      renal calculi  . Tonsillectomy    . Cataract extraction, bilateral      There were no vitals filed for this visit.      Subjective Assessment - 07/20/15 1507    Subjective More difficulty swallowing in last 2-3 months   Patient is accompained by: Family member  [name], and [name]'s niece   Currently in Pain? No/denies            SLP Evaluation [facility] - 07/20/15 1651    Oral Motor/Sensory Function   Overall Oral Motor/Sensory Function Impaired   Motor Speech   Overall Motor  Speech Impaired at baseline   Phonation Low vocal intensity;Breathy   Articulation Impaired   Level of Impairment Sentence   Intelligibility Intelligibility reduced   Phrase 75-100% accurate   Sentence 75-100% accurate   Conversation 75-100% accurate   Motor Planning Impaired   Level of Impairment Sentence   Motor Speech Errors Aware;Inconsistent   Interfering Components Premorbid status   Effective Techniques Increased vocal intensity;Slow rate   Phonation --           Prior Functional Status - 07/20/15 1510    Prior Functional Status    Lives With Significant other   Vocation Retired         General - 07/20/15 1651    General Information   HPI Pt has had more difficulty with swallowing in the last 2-3 months, now coughing 2-3 times per week, mostly on solids. Pt has reduced bite size to compensate. He has some nasal regurgitation with liquids.   Diet Prior to this Study Regular;Thin liquids   Respiratory Status Room air   History of Recent Intubation No   Behavior/Cognition Cooperative;Pleasant mood  requires repeats of some longer auditory directions   Oral Cavity Assessment Within Functional Limits   Oral Cavity - Dentition Adequate natural dentition   Baseline Vocal Quality  Low vocal intensity;Breathy   Volitional Cough Weak   Volitional Swallow Able to elicit          Oral Motor/Sensory Function - 07/20/15 1511    Oral Motor/Sensory Function   Overall Oral Motor/Sensory Function Mild impairment   Facial Strength --  difficult to test due to oral nonverbal apraxia   Lingual Symmetry Within Functional Limits   Lingual Strength Reduced  slight bil reduction   Lingual Sensation Within Functional Limits   Velum Within Functional Limits          Thin Liquid - 07/20/15 1548    Thin Liquid   Thin Liquid Impaired   Presentation Cup   Pharyngeal  Phase Impairments Wet Vocal Quality;Throat Clearing - Delayed   Other Comments small sips only, holding liquid  in mouth for 1/2 second or so, then using hard swallow, pt did not exhibit s/s aspiration immediately. Suspect oral residue moving to pharynx causing delayed throat clear and wet vocal quality           Puree - 07/20/15 1611    Puree   Puree Impaired   Presentation Self Fed   Pharyngeal Phase Impairments Throat Clearing - Immediate   Other Comments LARGE bite sizes x2. SLP cued pt to reduce bite size and use multiple swallows with liquid wash and s/s pharyngeal dysphagia were eliminated         Solid - 07/20/15 1616    Solid   Solid Within functional limits   Presentation Self Fed   Other Comments SLP initially cued pt for smaller bite size, and pt without overt s/s aspiration.               SLP Short Term Goals - 07/20/15 1634    SLP SHORT TERM GOAL #1   Title pt will complete HEP for swallowing with occasional min A over two sessions   Time 4   Period Weeks  or 8 sessions, for all STGs   Status New   SLP SHORT TERM GOAL #2   Title pt will demo swallow precuations with POs with rare min A over two sessions   Time 4   Period Weeks   Status New   SLP SHORT TERM GOAL #3   Title pt will exhibit functional communication with sentence level responses 85% of the time over two sessions          SLP Long Term Goals - 07/20/15 1640    SLP LONG TERM GOAL #1   Title pt will complete HEP for swallowing with rare min A over two sessions   Time 8   Period Weeks   Status New   SLP LONG TERM GOAL #2   Title pt will demo swallow precautions with rare nonverbal cues over two sessions   Time 8   Period Weeks   Status New   SLP LONG TERM GOAL #3   Title pt will communicate in 5 minutes simple to mod copmlex conversation, functionally   Time 8   Period Weeks   Status New   SLP LONG TERM GOAL #4   Title pt will indicate acute wants and needs (pain, assistance, etc) with multimodal communication    Time 8   Period Weeks   Status New          Plan - 07/20/15 1624     Clinical Impression Statement Pt presents with swallowing impaired, characterized by throat clear and wet voice with larger bites, and post swallow likely from  minimal oral residue moving to pharynx. Since pt well known to SLP, SLP made suggestions for modifications to pt mealtimes (see "pt instrtuctions"). Modified barium swallow has been ordered and will be scheduled in case pt requires objective assessment, if suggestions for modifications do not decr s/s aspiration during mealtimes. Pt also suffers from dysarthria c/b reduced breath support and reduced articulatory precision. Complicating speech deficit is pt's h/o CVA which resulted in persistent aphasia, oral nonverbal apraxia, and verbal apraxia. Pt would benefit from skilled ST addressing possiblity of improving strength of pt's swallow, and improving pt's speech intelligibility.   Speech Therapy Frequency 2x / week   Duration --  8 weeks   Treatment/Interventions Aspiration precaution training;Pharyngeal strengthening exercises;Diet toleration management by SLP;Trials of upgraded texture/liquids;Cueing hierarchy;Compensatory techniques;SLP instruction and feedback;Compensatory strategies;Patient/family education;Language facilitation;Multimodal communcation approach;Functional tasks;Oral motor exercises  any combination of these will be used in ST   Potential to Achieve Goals Fair   Potential Considerations Severity of impairments;Previous level of function   Consulted and Agree with Plan of Care Patient      Patient will benefit from skilled therapeutic intervention in order to improve the following deficits and impairments:   Dysphagia  Dysarthria and anarthria  Apraxia  Aphasia      G-Codes - Aug 01, 2015 1621    Functional Assessment Tool Used NOMS - 5 (approx 25% impaired)   Functional Limitations Swallowing   Swallow Current Status [id number]) At least 20 percent but less than 40 percent impaired, limited or restricted   Swallow  Goal Status [id number]) At least 1 percent but less than 20 percent impaired, limited or restricted      Problem List Patient Active Problem List   Diagnosis Date Noted  . Essential (primary) hypertension 12/27/2014  . Swelling of limb 12/27/2014  . Gastro-esophageal reflux disease without esophagitis 12/27/2014  . 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 Hypothyroidism 12/27/2014  . Mixed hyperlipidemia 12/27/2014  . Parkinson's disease ([place]) 12/27/2014  . Sequelae of cerebral infarction 12/27/2014  . Amaurosis fugax 08/16/2014  . Long term current use of anticoagulant 07/08/2014  . Cerebral infarction due to embolism of left middle cerebral artery ([place]) 06/22/2014  . Discoloration of skin of foot 03/15/2014  . PD (Parkinson's disease) ([place]) 03/15/2014  . Cerebral infarction due to embolism of cerebral artery ([place]) 03/15/2014  . Essential hypertension 03/15/2014  . HLD (hyperlipidemia) 03/15/2014  . Cough   . LV (left ventricular) mural thrombus ([place])   . CVA (cerebral infarction) 01/18/2014  . Imbalance 01/18/2014  . Slurring of speech   . Abnormality of gait 03/12/2012  . Paralysis agitans ([place]) 03/12/2012  . Acute cholecystitis 02/04/2012  . Hypertension 02/04/2012  . Hyperlipidemia 02/04/2012    SCHINKE,CARL.,MS, CCC-SLP  Aug 01, 2015, 4:56 PM  [facility] [address] [place], [place], [address] Phone: [phone]   Fax:  [phone]  Name: Kelten Hochstein MRN: [id number] Date of Birth: 1926-03-15

## 2015-07-20 NOTE — Therapy (Signed)
[name] [address] [place], [place], [address] Phone: [phone]   Fax:  [phone]  Occupational Therapy Evaluation  Patient Details  Name: Kyle Cameron MRN: [id number] Date of Birth: 05-15-1926 Referring Provider: Dr. Margette Fast  Encounter Date: 07/20/2015      OT End of Session - 07/20/15 1719    Visit Number 1   Number of Visits 17   Date for OT Re-Evaluation 09/08/15   Authorization Type 1Medicare, 2AARP, G-code needed   Authorization Time Period --   Authorization - Visit Number 1   Authorization - Number of Visits 10   OT Start Time [id number]   OT Stop Time 1445   OT Time Calculation (min) 40 min   Activity Tolerance Patient tolerated treatment well   Behavior During Therapy [facility] for tasks assessed/performed      Past Medical History  Diagnosis Date  . Hypertension   . High cholesterol   . Prostate atrophy   . GERD (gastroesophageal reflux disease)   . History of diverticulitis of colon   . Benign enlargement of prostate   . Hypothyroidism   . History of renal calculi   . Parkinson's disease ([place])   . Renal calculi   . CVA (cerebral infarction)   . Stroke ([place])   . Anticoagulated on Coumadin   . Headache   . Labyrinthitis   . Grover's disease   . Cataracts, bilateral   . Parkinson's disease ([place])   . Amaurosis fugax 08/16/2014    Right eye    Past Surgical History  Procedure Laterality Date  . Cholecystectomy  02/04/2012    Procedure: LAPAROSCOPIC CHOLECYSTECTOMY WITH INTRAOPERATIVE CHOLANGIOGRAM;  Surgeon: Merrie Roof, MD;  Location: [facility];  Service: General;  Laterality: N/A;  . Lithotripsy      renal calculi  . Tonsillectomy    . Cataract extraction, bilateral      There were no vitals filed for this visit.      Subjective Assessment - 07/20/15 1415    Subjective  Pt reports that he seems only slightly better after incr in medication (went to [facility])   Pertinent  History Pt with PD (diagnosed approx 4 years ago), hx of 2 CVAs, fall risk with freezing   Limitations fall risk, freezing   Patient Stated Goals improve coordination/use of RUE, improve mobility   Currently in Pain? No/denies  currently, but reports back discomfort if standing for more than 4-80min           [facility] OT Assessment - 07/20/15 0001    Assessment   Diagnosis Parkinson's disease, CVA  Parkinsonism/Parkinson's Plus suspected per records   Referring Provider Dr. Margette Fast   Onset Date --  diagnosed approx 4 years ago   Prior Therapy preveiously d/c from OT, PT, ST 01/12/15   Precautions   Precautions Fall   Balance Screen   Has the patient fallen in the past 6 months Yes   How many times? --  1 since last therapy   Has the patient had a decrease in activity level because of a fear of falling?  Yes   Prior Function   Level of Independence Independent with basic ADLs   Vocation Retired   Leisure exercises 3x/wk-[employer]-rides bike and has trainer; would like to begin [employer]   ADL   Eating/Feeding --  difficulty cutting food, spilling, manipulating utensils   Eating/Feeding Patient Percentage --  difficulty swallowing   Grooming --  incr time, difficulty but using RUE   Upper Body Bathing --  incr time   Lower Body Bathing Increased time   Upper Body Dressing Increased time  difficulty with fasteners   Lower Body Dressing Increased time  difficulty tying shoes   Toilet Tranfer Modified independent   Tub/Shower Transfer Modified independent   Transfers/Ambulation Related to ADL's Pt with freezing episodes and close supervision/cueing needed for safety with transfers with pt unlocking breaks during transfer   IADL   Light Housekeeping --  friend performs   Meal Prep --  decr performance due to freezing, back discomfort standing   Mobility   Mobility Status Freezing;History of falls   Mobility Status Comments Pt ambulating with U-step walker in  the home and uses w/c in the community   Written Expression   Dominant Hand Right   Handwriting Severe micrographia  per pt report   Activity Tolerance   Activity Tolerance Comments Pt reports back discomfort/pain when standing longer than 3-4 min (4-5/10 pain)   Cognition   Overall Cognitive Status Impaired/Different from baseline  impulsive, decr memory   Behaviors Impulsive;Poor frustration tolerance   Observation/Other Assessments   Other Surveys  Select   Physical Performance Test   Yes   Simulated Eating Time (seconds) 14.28sec    Simulated Eating Comments spoon held between 2nd and 3rd digits   Donning Doffing Jacket Time (seconds) Not assessed due to today due to time constraints and balance deficits   Donning Doffing Jacket Comments Fastening/unfastening 3 buttons on table:  unable to do with fingers, >86min with button hook   Sensation   Additional Comments decr sensation RUE per caregiver report   Coordination   Right 9 Hole Peg Test 74.59sec with multiple drops   Left 9 Hole Peg Test 42.75   Box and Blocks R 28 blocks, L 34 blocks   Tone   Assessment Location Right Upper Extremity;Left Upper Extremity   ROM / Strength   AROM / PROM / Strength AROM   AROM   Overall AROM  Deficits   Overall AROM Comments R shoulder flex ion 110* with -50* elbow ext and LUE 130* shoulder flex with elbow ext WNL   RUE Tone   RUE Tone Moderate   LUE Tone   LUE Tone Mild                           OT Short Term Goals - 07/20/15 1726    OT SHORT TERM GOAL #1   Title Pt will be independent with updated HEP.--due 08/18/15   Time 4   Period Weeks   Status New   OT SHORT TERM GOAL #2   Title Pt will improve coordination as shown by completing 9-hole peg test in 60 secs or less with dominant R hand.   Baseline 74.59sec with multiple drops   Time 4   Period Weeks   Status New   OT SHORT TERM GOAL #3   Title Pt will be able to stand at least 60min to perform functional  activity without rest for incr activity tolerance.   Baseline 3-5min   Time 4   Period Weeks   Status Achieved   OT SHORT TERM GOAL #4   Title Pt will demonstrate ability to perfom RUE elbow extension at -30 for improved functional reach   Baseline baseline -50   Time 4   Period Weeks   Status New  OT Long Term Goals - August 19, 2015 1731    OT LONG TERM GOAL #1   Title Pt will verbalize understanding of adaptive strategies for increased safety and independence with  ADLs/IADLs prn    Baseline ----   Period Weeks   Status New   OT LONG TERM GOAL #2   Title Pt will improve coordination as shown by completing 9-hole peg test in 55 secs or less with right dominant hand   Baseline R 74.59sec with multiple drops   Time 8   Period Weeks   Status New   OT LONG TERM GOAL #3   Title Pt will demonstrate improved functional reaching/ coordination for ADLs as evidenced by improving box/ blocks score to 33 blocks for RUE.   Baseline R 28 blocks, L 39 blocks   Time 8   Period Weeks   Status New   OT LONG TERM GOAL #4   Title Pt will demonstrate ability to retrieve lightweight items at 115 shoulder flexion with RUE.   Baseline 110*   Time 8   Period Weeks   Status New   OT LONG TERM GOAL #5   Title Assess PPT#4 and establish goal as appropriate   Time 8   Period Weeks   Status New   OT LONG TERM GOAL #6   Title Pt will fasten/unfasten 3 buttons in 90sec or less using button hook/strategies prn.   Time 8   Period Weeks   Status New               Plan - 08/19/2015 1720    Clinical Impression Statement Pt is a 82 y.o. male referred to occupational therapy for Parkinson's disease.  Pt reports that he recently went to [facility] and that he may possibly have Parkinsons Plus.  Pt with PMH that includes 2 CVAs with R sided weakness, multiple falls (see Epic for full hx).  Pt presents today with decr functional mobility/balance, freezing, timing and coordination deficits, decr  sensation, rigidity, bradykinesia/hypokinesia affecting RUE functional use and ADL/IADL performance.  Pt would benefit from occupational therapy to address these deficits for improved ease, safety, performance of ADLs/IADLs, prevent future complications, and improve quality of life.   Rehab Potential Fair   Clinical Impairments Affecting Rehab Potential speed and timing, cognitive deficits   OT Frequency 2x / week   OT Duration 8 weeks  +eval   OT Treatment/Interventions Self-care/ADL training;Moist Heat;DME and/or AE instruction;Fluidtherapy;Splinting;Patient/family education;Balance training;Therapeutic exercises;Therapeutic exercise;Therapeutic activities;Functional Mobility Training;Neuromuscular education;Cryotherapy;Energy conservation;Manual Therapy;Electrical Stimulation;Passive range of motion;Cognitive remediation/compensation;Visual/perceptual remediation/compensation   Plan initiate coordination HEP review; PPT#4 and write goals as appropriate prn   Consulted and Agree with Plan of Care Patient      Patient will benefit from skilled therapeutic intervention in order to improve the following deficits and impairments:  Decreased balance, Decreased knowledge of use of DME, Impaired UE functional use, Impaired perceived functional ability, Decreased strength, Decreased mobility, Decreased activity tolerance, Impaired tone, Impaired sensation, Decreased safety awareness, Decreased endurance, Decreased coordination, Decreased cognition, Pain, Improper spinal/pelvic alignment, Difficulty walking, Decreased range of motion, Decreased knowledge of precautions (hypokinesia/bradykinesia)  Visit Diagnosis: Other symptoms and signs involving the nervous system  Other symptoms and signs involving the musculoskeletal system  Stiffness of right elbow, not elsewhere classified  Stiffness of right shoulder, not elsewhere classified  Other lack of coordination  Other abnormalities of gait and  mobility      G-Codes - 2015-08-19 1736    Functional Assessment Tool Used 9-hole peg test  RUE 74.59sec, box and blocks RUE 28 blocks, >59min to fasten/unfasten 3 buttons with button hook   Functional Limitation Carrying, moving and handling objects   Carrying, Moving and Handling Objects Current Status [id number]) At least 60 percent but less than 80 percent impaired, limited or restricted   Carrying, Moving and Handling Objects Goal Status [id number]) At least 40 percent but less than 60 percent impaired, limited or restricted      Problem List Patient Active Problem List   Diagnosis Date Noted  . Essential (primary) hypertension 12/27/2014  . Swelling of limb 12/27/2014  . Gastro-esophageal reflux disease without esophagitis 12/27/2014  . 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 Hypothyroidism 12/27/2014  . Mixed hyperlipidemia 12/27/2014  . Parkinson's disease ([place]) 12/27/2014  . Sequelae of cerebral infarction 12/27/2014  . Amaurosis fugax 08/16/2014  . Long term current use of anticoagulant 07/08/2014  . Cerebral infarction due to embolism of left middle cerebral artery ([place]) 06/22/2014  . Discoloration of skin of foot 03/15/2014  . PD (Parkinson's disease) ([place]) 03/15/2014  . Cerebral infarction due to embolism of cerebral artery ([place]) 03/15/2014  . Essential hypertension 03/15/2014  . HLD (hyperlipidemia) 03/15/2014  . Cough   . LV (left ventricular) mural thrombus ([place])   . CVA (cerebral infarction) 01/18/2014  . Imbalance 01/18/2014  . Slurring of speech   . Abnormality of gait 03/12/2012  . Paralysis agitans ([place]) 03/12/2012  . Acute cholecystitis 02/04/2012  . Hypertension 02/04/2012  . Hyperlipidemia 02/04/2012    [facility] 07/20/2015, 5:39 PM  [facility] [address] [place], [place], [address] Phone: [phone]   Fax:  [phone]  Name: Lance Montford MRN: [id number] Date of Birth:  1926-07-17  Vianne Bulls, OTR/L [facility] [address] [place], [place]  [address] [phone] phone [phone] 07/20/2015 5:39 PM

## 2015-07-27 ENCOUNTER — Encounter: Payer: Medicare Other | Admitting: Occupational Therapy

## 2015-07-27 ENCOUNTER — Ambulatory Visit: Payer: Medicare Other | Admitting: Physical Therapy

## 2015-08-02 ENCOUNTER — Ambulatory Visit: Payer: Medicare Other | Attending: Neurology | Admitting: Physical Therapy

## 2015-08-02 DIAGNOSIS — R29818 Other symptoms and signs involving the nervous system: Secondary | ICD-10-CM | POA: Diagnosis present

## 2015-08-02 DIAGNOSIS — R2689 Other abnormalities of gait and mobility: Secondary | ICD-10-CM

## 2015-08-02 DIAGNOSIS — R2681 Unsteadiness on feet: Secondary | ICD-10-CM

## 2015-08-03 NOTE — Therapy (Signed)
[facility] [address] [place], [place], [address] Phone: [phone]   Fax:  [phone]  Physical Therapy Evaluation  Patient Details  Name: Kyle Cameron MRN: [id number] Date of Birth: 1926/07/14 Referring Provider: Lenor Coffin  Encounter Date: 08/02/2015      PT End of Session - 08/03/15 1239    Visit Number 1  new episode of PT 08/02/15   Number of Visits 9   Date for PT Re-Evaluation 10/02/15   Authorization Type Medicare G-code every 10th visit   PT Start Time 1020   PT Stop Time 1059   PT Time Calculation (min) 39 min   Equipment Utilized During Treatment Gait belt   Activity Tolerance Patient tolerated treatment well   Behavior During Therapy [facility] for tasks assessed/performed      Past Medical History  Diagnosis Date  . Hypertension   . High cholesterol   . Prostate atrophy   . GERD (gastroesophageal reflux disease)   . History of diverticulitis of colon   . Benign enlargement of prostate   . Hypothyroidism   . History of renal calculi   . Parkinson's disease ([place])   . Renal calculi   . CVA (cerebral infarction)   . Stroke ([place])   . Anticoagulated on Coumadin   . Headache   . Labyrinthitis   . Grover's disease   . Cataracts, bilateral   . Parkinson's disease ([place])   . Amaurosis fugax 08/16/2014    Right eye    Past Surgical History  Procedure Laterality Date  . Cholecystectomy  02/04/2012    Procedure: LAPAROSCOPIC CHOLECYSTECTOMY WITH INTRAOPERATIVE CHOLANGIOGRAM;  Surgeon: Merrie Roof, MD;  Location: [facility];  Service: General;  Laterality: N/A;  . Lithotripsy      renal calculi  . Tonsillectomy    . Cataract extraction, bilateral      There were no vitals filed for this visit.       Subjective Assessment - 08/02/15 1024    Subjective Has had 2 falls in the past week.  Have gone to [facility]Park[facility] syndrome diagnosis and they are having [place] try the Sinemet a different  way.  Still having significant freezing epsiodes.  Feels he moves better in the mornings.   Patient is accompained by: [name]   Pertinent History CVA 12/26/13 and 01/18/14, Parkinson's disease; no walking at home due to smaller spaces; some walking occasionally at gym track   Patient Stated Goals Pt's goal for therapy is to get around better.   Currently in Pain? No/denies            [facility] PT Assessment - 08/02/15 1029    Assessment   Medical Diagnosis Parkinson's disease   Referring Provider Lenor Coffin   Precautions   Precautions Fall   Precaution Comments Most falls occur when getting started or with turning   Balance Screen   Has the patient fallen in the past 6 months Yes   How many times? 7-8   Has the patient had a decrease in activity level because of a fear of falling?  Yes   Is the patient reluctant to leave their home because of a fear of falling?  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, broadcasting/film/video Impulsive   Posture/Postural Control   Posture/Postural Control Postural limitations   Postural Limitations Forward head;Rounded Shoulders;Posterior pelvic tilt   ROM / Strength   AROM / PROM / Strength AROM   AROM   Overall AROM  Deficits   Overall AROM Comments R ankle dorsiflexion neutral; L ankle dorsiflexion +10 degrees   Transfers   Transfers Sit to Stand;Stand to Sit   Sit to Stand 5: Supervision   Stand to Sit 5:  Supervision   Comments When performing stand pivot transfer seated rollator<>chair, pt takes 5.57 sec to chair and 11.27 sec to get back to rollator seat   Ambulation/Gait   Ambulation/Gait Yes   Ambulation/Gait Assistance 5: Supervision;4: Min guard   Ambulation Distance (Feet) 80 Feet   Assistive device Other (Comment)  U-Step RW   Gait Pattern Step-to pattern;Step-through pattern;Decreased step length - right;Decreased step length - left;Festinating;Trunk flexed;Narrow base of support;Poor foot clearance - left;Poor foot clearance - right  freezing of gait with turns   Ambulation Surface Level;Indoor   Gait velocity 23.02 sec = 1.42 ft/sec   Gait Comments When turning around (180 degrees) using U-step RW, pt able to perform in 43,98 seconds, with multiple episodes of freezing of gait.   Standardized Balance Assessment   Standardized Balance Assessment Berg Balance Test   Berg Balance Test   Sit to Stand Able to stand  independently using hands   Standing Unsupported Able to stand 2 minutes with supervision   Sitting with Back Unsupported but Feet Supported on Floor or Stool Able to sit safely and securely 2 minutes   Stand to Sit Sits independently, has uncontrolled descent   Transfers Able to transfer with verbal cueing and /or supervision  5.57 to chair; 11.27 sec back to U-step seat   Standing Unsupported with Eyes Closed Able to stand 10 seconds with supervision   Standing Ubsupported with Feet Together Needs help to attain position but able to stand for 30 seconds with feet together   From Standing, Reach Forward with Outstretched Arm Can reach forward >5 cm safely (2")   From Standing Position, Pick up Object from Floor Unable to try/needs assist to keep balance   From Standing Position, Turn to Look Behind Over each Shoulder Turn sideways only but maintains balance   Turn 360 Degrees Needs assistance while turning   Standing Unsupported, Alternately Place Feet on Step/Stool  Needs assistance to keep from falling or unable to try   Standing Unsupported, One Foot in Front Needs help to step but can hold 15 seconds   Standing on One Leg Unable to try or needs assist to prevent fall   Total Score 22   Berg comment: Scores <45/56 indicate increased fall risk                                PT Long Term Goals - 08/03/15 1248    PT LONG TERM GOAL #1   Title Pt will verbalize/demonstrate understanding of techniques to reduce freezing with gait.  TARGET 08/22/2015   Time 4   Period [place]  Status New   PT LONG TERM GOAL #2   Title Pt will perform 8 of 10 reps of sit<>stand transfers using safe, proper technique, with no posterior LOB.   Time 4   Period Weeks   PT LONG TERM GOAL #3   Title Pt will be able to perform chair<>seated rollator stand pivot transfer in 20% less time that at eval for improved efficiency of transfers.   Baseline rollator seat > chair 5.57 sec, back to rollator seat 11.27 sec   Time 4   Period Weeks   Status New   PT LONG TERM GOAL #4   Title Pt will improve Berg Balance score to at least 27/56 for decreased fall risk.   Time 4   Period Weeks   Status New   PT LONG TERM GOAL #5   Title Pt will improve 180 degree turns using U-step Rollator walker by at least 20% improvement in time, for improved efficiency and safety with turns.   Baseline 43.98 sec at eval   Time 4   Period Weeks   Status New               Plan - 08/03/15 1241    Clinical Impression Statement Pt is an 82 year old male who presents to OP PT with history of Parkinson's disease, with significant freezing of gait episodes.  Pt has noted decline in functional mobility since last bout of physical therapy approximately 6 months ago, and pt has had at least 2 falls in the past week.  Pt presents with decreased timing and coordination with gait, decreased balance, decreased funcitional strength, festination and freezing of gait, with recent history  of falls.  Pt is at high fall risk per Merrilee Jansky, gait velocity scores.  Pt takes increased time for stand pivot transfers and for turning with walker due to freezing episodes.  Pt would benefit from skilled phyiscal therapy to address the above stated deficits to decrease fall risk and to improve functional mobility.   Rehab Potential Good   PT Frequency 2x / week   PT Duration 4 weeks  plus eval   PT Treatment/Interventions ADLs/Self Care Home Management;Therapeutic exercise;Therapeutic activities;Functional mobility training;Gait training;Balance training;Neuromuscular re-education;Patient/family education   PT Next Visit Plan Discuss and practice strategies to reduce freezing upon standing and initiating gait.  Work on timing and coordination of transfer technique.   Consulted and Agree with Plan of Care Patient      Patient will benefit from skilled therapeutic intervention in order to improve the following deficits and impairments:  Abnormal gait, Decreased balance, Decreased mobility, Decreased coordination, Decreased range of motion, Decreased strength, Difficulty walking, Impaired flexibility, Postural dysfunction  Visit Diagnosis: Other abnormalities of gait and mobility  Other symptoms and signs involving the nervous system  Unsteadiness on feet      G-Codes - 31-Aug-2015 1251    Functional Assessment Tool Used gait velocity 1.42 ft/sec, Berg 22/56, 43.98 sec to turn 180 degrees   Functional Limitation Mobility: Walking and moving around   Mobility: Walking and Moving Around Current Status [phone]) At least 60 percent but less than 80 percent impaired, limited or restricted   Mobility: Walking and Moving Around Goal Status [phone]) At least 40 percent but less than 60 percent impaired, limited or restricted       Problem List Patient Active Problem List   Diagnosis Date Noted  . Essential (primary) hypertension 12/27/2014  . Swelling of limb 12/27/2014  . Gastro-esophageal reflux  disease without  esophagitis 12/27/2014  . 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 Hypothyroidism 12/27/2014  . Mixed hyperlipidemia 12/27/2014  . Parkinson's disease ([place]) 12/27/2014  . Sequelae of cerebral infarction 12/27/2014  . Amaurosis fugax 08/16/2014  . Long term current use of anticoagulant 07/08/2014  . 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 Discoloration of skin of foot 03/15/2014  . PD (Parkinson's disease) ([place]) 03/15/2014  . Cerebral infarction due to embolism of cerebral artery ([place]) 03/15/2014  . Essential hypertension 03/15/2014  . HLD (hyperlipidemia) 03/15/2014  . Cough   . LV (left ventricular) mural thrombus ([place])   . CVA (cerebral infarction) 01/18/2014  . Imbalance 01/18/2014  . Slurring of speech   . Abnormality of gait 03/12/2012  . Paralysis agitans ([place]) 03/12/2012  . Acute cholecystitis 02/04/2012  . Hypertension 02/04/2012  . Hyperlipidemia 02/04/2012    Kinnick Maus W. 08/03/2015, 12:52 PM Frazier Butt., PT [facility] [address] [place], [place], [address] Phone: [phone]   Fax:  [phone]  Name: Chrishun Kimura MRN: [id number] Date of Birth: 1926-08-07

## 2015-08-08 ENCOUNTER — Other Ambulatory Visit (HOSPITAL_COMMUNITY): Payer: Self-pay | Admitting: Internal Medicine

## 2015-08-08 ENCOUNTER — Ambulatory Visit: Payer: Medicare Other | Admitting: Physical Therapy

## 2015-08-08 DIAGNOSIS — R2681 Unsteadiness on feet: Secondary | ICD-10-CM

## 2015-08-08 DIAGNOSIS — R2689 Other abnormalities of gait and mobility: Secondary | ICD-10-CM

## 2015-08-08 DIAGNOSIS — R1310 Dysphagia, unspecified: Secondary | ICD-10-CM

## 2015-08-08 DIAGNOSIS — R29818 Other symptoms and signs involving the nervous system: Secondary | ICD-10-CM

## 2015-08-08 NOTE — Therapy (Signed)
[facility] [address] [place], [place], [address] Phone: [phone]   Fax:  [phone]  Physical Therapy Treatment  Patient Details  Name: Kyle Cameron MRN: [id number] Date of Birth: [birth year] Referring Provider: Lenor Coffin  Encounter Date: 08/08/2015      PT End of Session - 08/08/15 1246    Visit Number 2  new episode of PT 08/02/15   Number of Visits 9   Date for PT Re-Evaluation 10/02/15   Authorization Type Medicare G-code every 10th visit   PT Start Time 1150   PT Stop Time 1231   PT Time Calculation (min) 41 min   Equipment Utilized During Treatment Gait belt   Activity Tolerance Patient tolerated treatment well   Behavior During Therapy [facility] for tasks assessed/performed      Past Medical History  Diagnosis Date  . Hypertension   . High cholesterol   . Prostate atrophy   . GERD (gastroesophageal reflux disease)   . History of diverticulitis of colon   . Benign enlargement of prostate   . Hypothyroidism   . History of renal calculi   . Parkinson's disease ([place])   . Renal calculi   . CVA (cerebral infarction)   . Stroke ([place])   . Anticoagulated on Coumadin   . Headache   . Labyrinthitis   . Grover's disease   . Cataracts, bilateral   . Parkinson's disease ([place])   . Amaurosis fugax 08/16/2014    Right eye    Past Surgical History  Procedure Laterality Date  . Cholecystectomy  02/04/2012    Procedure: LAPAROSCOPIC CHOLECYSTECTOMY WITH INTRAOPERATIVE CHOLANGIOGRAM;  Surgeon: Merrie Roof, MD;  Location: [facility];  Service: General;  Laterality: N/A;  . Lithotripsy      renal calculi  . Tonsillectomy    . Cataract extraction, bilateral      There were no vitals filed for this visit.      Subjective Assessment - 08/08/15 1154    Subjective Had a fall yesterday while standing up from wheelchair to the floor and needed assistance to stand.  Denies injury   Pertinent History  CVA 12/26/13 and 01/18/14, Parkinson's disease; no walking at home due to smaller spaces; some walking occasionally at gym track   Patient Stated Goals Pt's goal for therapy is to get around better.   Currently in Pain? No/denies     Reviewed safe and proper technique with sit<>stand transfers verbally with pt before initiating transfers and reinforced need for slow, controlled movements. Sit<>stand x 10 reps with verbal cues to maintain technique. Round robin/quarter turns with 4 chairs around pt for UE support.  Pt unable to complete a quarter turn step in <3 steps with either LE.  Increased difficulty stepping with LLE and to L side. Used weight shifting techniques and high bil LE marching to attempt to step.  Gait with U-step RW and laser cue for step length.  Pt with 4 episodes of freezing during gait around circle lap in gym but recovered quickly.  With turns to chair after ambulating, pt with significant freezing and at times took > 45 seconds to be able to step once in freezing pattern despite cues for weight shifting and stepping pattern.  Pt states he continues to lose weight due to swallowing issues and is concerned about going to a wedding the end of the month.  Discussed importance of nutrition in strengthening but also importance of not taking Sinemet along with protein  meal or shakes.          PT Education - 08/08/15 1245    Education provided Yes   Education Details Ways to reduce freezing, transfer training,avoiding protein within 30 minutes of taking Sinemet   Person(s) Educated Patient   Methods Explanation;Demonstration;Verbal cues;Tactile cues   Comprehension Verbalized understanding;Need further instruction           PT Long Term Goals - 08/03/15 1248    PT LONG TERM GOAL #1   Title Pt will verbalize/demonstrate understanding of techniques to reduce freezing with gait.  TARGET 09/19/2015   Time 4   Period Weeks   Status New   PT LONG TERM GOAL #2   Title Pt will  perform 8 of 10 reps of sit<>stand transfers using safe, proper technique, with no posterior LOB.   Time 4   Period Weeks   PT LONG TERM GOAL #3   Title Pt will be able to perform chair<>seated rollator stand pivot transfer in 20% less time that at eval for improved efficiency of transfers.   Baseline rollator seat > chair 5.57 sec, back to rollator seat 11.27 sec   Time 4   Period Weeks   Status New   PT LONG TERM GOAL #4   Title Pt will improve Berg Balance score to at least 27/56 for decreased fall risk.   Time 4   Period Weeks   Status New   PT LONG TERM GOAL #5   Title Pt will improve 180 degree turns using U-step Rollator walker by at least 20% improvement in time, for improved efficiency and safety with turns.   Baseline 43.98 sec at eval   Time 4   Period Weeks   Status New               Plan - 08/08/15 1247    Clinical Impression Statement Pt continues to have significant freezing during transfers and with gait, especially turns and over thresholds.  Continue PT per POC.   Rehab Potential Good   PT Frequency 2x / week   PT Duration 4 weeks  plus eval   PT Treatment/Interventions ADLs/Self Care Home Management;Therapeutic exercise;Therapeutic activities;Functional mobility training;Gait training;Balance training;Neuromuscular re-education;Patient/family education   PT Next Visit Plan Review strategies to reduce freezing upon standing and initiating gait.  Work on timing and coordination of transfer technique.  Gait with simulated home environment.   Consulted and Agree with Plan of Care Patient      Patient will benefit from skilled therapeutic intervention in order to improve the following deficits and impairments:  Abnormal gait, Decreased balance, Decreased mobility, Decreased coordination, Decreased range of motion, Decreased strength, Difficulty walking, Impaired flexibility, Postural dysfunction  Visit Diagnosis: Other abnormalities of gait and  mobility  Other symptoms and signs involving the nervous system  Unsteadiness on feet     Problem List Patient Active Problem List   Diagnosis Date Noted  . Essential (primary) hypertension 12/27/2014  . Swelling of limb 12/27/2014  . Gastro-esophageal reflux disease without esophagitis 12/27/2014  . 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 Hypothyroidism 12/27/2014  . Mixed hyperlipidemia 12/27/2014  . Parkinson's disease ([place]) 12/27/2014  . Sequelae of cerebral infarction 12/27/2014  . Amaurosis fugax 08/16/2014  . Long term current use of anticoagulant 07/08/2014  . Cerebral infarction due to embolism of left middle cerebral artery ([place]) 06/22/2014  . Discoloration of skin of foot 03/15/2014  . PD (Parkinson's disease) ([place]) 03/15/2014  . Cerebral infarction due to  embolism of cerebral artery ([place]) 03/15/2014  . Essential hypertension 03/15/2014  . HLD (hyperlipidemia) 03/15/2014  . Cough   . LV (left ventricular) mural thrombus ([place])   . CVA (cerebral infarction) 01/18/2014  . Imbalance 01/18/2014  . Slurring of speech   . Abnormality of gait 03/12/2012  . Paralysis agitans ([place]) 03/12/2012  . Acute cholecystitis 02/04/2012  . Hypertension 02/04/2012  . Hyperlipidemia 02/04/2012    [name] 08/08/2015, 1:01 PM  [facility] [address] [place], [place], [address] Phone: [phone]   Fax:  [phone]  Name: Kyle Cameron MRN: [id number] Date of Birth: January 06, 1927    [name], [place] 08/08/2015 1:01 PM Phone: [phone] Fax: [phone]

## 2015-08-10 ENCOUNTER — Telehealth: Payer: Self-pay

## 2015-08-10 MED ORDER — SELEGILINE HCL 5 MG PO TABS: 5.0000 mg | ORAL_TABLET | Freq: Two times a day (BID) | ORAL | Status: AC

## 2015-08-10 MED ORDER — CARBIDOPA-LEVODOPA 25-250 MG PO TABS: ORAL_TABLET | ORAL | Status: AC

## 2015-08-10 MED ORDER — PRAMIPEXOLE DIHYDROCHLORIDE 0.25 MG PO TABS: 0.2500 mg | ORAL_TABLET | Freq: Three times a day (TID) | ORAL | Status: AC

## 2015-08-10 NOTE — Telephone Encounter (Signed)
90 day refill request

## 2015-08-12 DIAGNOSIS — R1310 Dysphagia, unspecified: Secondary | ICD-10-CM | POA: Insufficient documentation

## 2015-08-14 ENCOUNTER — Telehealth: Payer: Self-pay | Admitting: Neurology

## 2015-08-14 NOTE — Telephone Encounter (Signed)
I called [name]. The patient has been admitted with dysphagia, they are recommending a PEG tube placement. I do not think that she wait to have it done up here if he is actively aspirating. Follow-up with a GI doctor here can be done if needed. The patient has deteriorated rapidly with his ability to swallow.

## 2015-08-14 NOTE — Telephone Encounter (Signed)
Pt's , POA [name]  , called and says pt has been admitted to hospital in [place], [place] . They have done a Barium Swallow eval . He has lost muscularity to swallow and will aspirate. NG tube has been place and a PEG Tube is up for discussion. [name] , [place] , wants to know if they should wait to place PEG tube in [place]. Please call and advise

## 2015-08-16 LAB — BASIC METABOLIC PANEL
BUN: 22 mg/dL — AB (ref 4–21)
Creatinine: 1.1 mg/dL (ref 0.6–1.3)
GLUCOSE: 95 mg/dL
POTASSIUM: 4 mmol/L (ref 3.4–5.3)
Sodium: 143 mmol/L (ref 137–147)

## 2015-08-17 ENCOUNTER — Ambulatory Visit: Payer: Medicare Other

## 2015-08-17 ENCOUNTER — Ambulatory Visit: Payer: Medicare Other | Admitting: Physical Therapy

## 2015-08-18 LAB — CBC AND DIFFERENTIAL
HEMATOCRIT: 32 % — AB (ref 41–53)
Hemoglobin: 10 g/dL — AB (ref 13.5–17.5)
PLATELETS: 443 10*3/uL — AB (ref 150–399)
WBC: 10.6 10^3/mL

## 2015-08-18 LAB — BASIC METABOLIC PANEL
BUN: 33 mg/dL — AB (ref 4–21)
CREATININE: 1.2 mg/dL (ref 0.6–1.3)
GLUCOSE: 102 mg/dL
Potassium: 4.4 mmol/L (ref 3.4–5.3)
Sodium: 141 mmol/L (ref 137–147)

## 2015-08-18 LAB — POCT INR: INR: 1.3 — AB (ref 0.9–1.1)

## 2015-08-21 ENCOUNTER — Non-Acute Institutional Stay (SKILLED_NURSING_FACILITY): Payer: Medicare Other | Admitting: Internal Medicine

## 2015-08-21 ENCOUNTER — Ambulatory Visit (HOSPITAL_COMMUNITY): Admission: RE | Admit: 2015-08-21 | Payer: Medicare Other | Source: Ambulatory Visit

## 2015-08-21 ENCOUNTER — Encounter: Payer: Self-pay | Admitting: Internal Medicine

## 2015-08-21 ENCOUNTER — Other Ambulatory Visit (HOSPITAL_COMMUNITY): Payer: Medicare Other

## 2015-08-21 DIAGNOSIS — K269 Duodenal ulcer, unspecified as acute or chronic, without hemorrhage or perforation: Secondary | ICD-10-CM | POA: Diagnosis not present

## 2015-08-21 DIAGNOSIS — I10 Essential (primary) hypertension: Secondary | ICD-10-CM

## 2015-08-21 DIAGNOSIS — D62 Acute posthemorrhagic anemia: Secondary | ICD-10-CM

## 2015-08-21 DIAGNOSIS — N400 Enlarged prostate without lower urinary tract symptoms: Secondary | ICD-10-CM

## 2015-08-21 DIAGNOSIS — E46 Unspecified protein-calorie malnutrition: Secondary | ICD-10-CM

## 2015-08-21 DIAGNOSIS — E785 Hyperlipidemia, unspecified: Secondary | ICD-10-CM

## 2015-08-21 DIAGNOSIS — R1310 Dysphagia, unspecified: Secondary | ICD-10-CM | POA: Diagnosis not present

## 2015-08-21 DIAGNOSIS — R531 Weakness: Secondary | ICD-10-CM

## 2015-08-21 DIAGNOSIS — Z8673 Personal history of transient ischemic attack (TIA), and cerebral infarction without residual deficits: Secondary | ICD-10-CM | POA: Diagnosis not present

## 2015-08-21 DIAGNOSIS — R471 Dysarthria and anarthria: Secondary | ICD-10-CM | POA: Diagnosis not present

## 2015-08-21 DIAGNOSIS — R2681 Unsteadiness on feet: Secondary | ICD-10-CM

## 2015-08-21 DIAGNOSIS — E039 Hypothyroidism, unspecified: Secondary | ICD-10-CM | POA: Diagnosis not present

## 2015-08-21 DIAGNOSIS — K5901 Slow transit constipation: Secondary | ICD-10-CM | POA: Diagnosis not present

## 2015-08-21 DIAGNOSIS — K209 Esophagitis, unspecified without bleeding: Secondary | ICD-10-CM

## 2015-08-21 DIAGNOSIS — G20A1 Parkinson's disease without dyskinesia, without mention of fluctuations: Secondary | ICD-10-CM

## 2015-08-21 DIAGNOSIS — G20 Parkinson's disease: Secondary | ICD-10-CM

## 2015-08-21 NOTE — Progress Notes (Signed)
LOCATION: [facility]  PCP: Kandice Hams, MD   Code Status: DNR  Goals of care: Advanced Directive information Advanced Directives 08/21/2015  Does patient have an advance directive? Yes  Type of Advance Directive Out of facility DNR (pink MOST or yellow form)  Does patient want to make changes to advanced directive? No - Patient declined  Copy of advanced directive(s) in chart? Yes  Pre-existing out of facility DNR order (yellow form or pink MOST form) -       Extended Emergency Contact Information Primary Emergency Contact: [name] Phone: [phone] Mobile Phone: [phone] Relation: Friend   Allergies  Allergen Reactions  . Ace Inhibitors Other (See Comments)    Cough    Chief Complaint  Patient presents with  . New Admit To SNF    New Admission     HPI:  Patient is a 82 y.o. male seen today for short term rehabilitation post hospital admission from 08/11/15-08/18/15 with acute on chronic dysphagia. He was seen by GI team and underwent EGD showing esophagitis, duodenal ulcer and possible esophageal mass. He had biopsy result of which is pending. He had PEG tube placement and now receives medication and feed through peg tube. He was treated with antibiotics for aspiration pneumonia. [name] also had AKI and received iv fluids. He has PMH of parkinson's syndrome, hypothyroidism, HTN, HLD, CVA among others. He is seen in his room today with his HCPOA, his friend present.   Review of Systems:  Constitutional: Negative for fever, chills. Energy level is slowly coming back.  HENT: Negative for headache, congestion, nasal discharge, hearing loss, sore throat  Eyes: Negative for blurred vision, double vision and discharge.  Respiratory: Negative for cough, shortness of breath and wheezing.   Cardiovascular: Negative for chest pain, palpitations, leg swelling.  Gastrointestinal: Negative for nausea, vomiting, abdominal pain, loss  of appetite. He has been constipated and has been straining with bowel movement. Had a small bowel movement yesterday.  Genitourinary: Negative for dysuria and flank pain.  Musculoskeletal: Negative for back pain, fall in the facility. had falls at home and uses rollator walker at home Skin: Negative for itching, rash.  Neurological: Negative for dizziness. Psychiatric/Behavioral: Negative for depression   Past Medical History:  Diagnosis Date  . Amaurosis fugax 08/16/2014   Right eye  . Anticoagulated on Coumadin   . Benign enlargement of prostate   . Cataracts, bilateral   . CVA (cerebral infarction)   . GERD (gastroesophageal reflux disease)   . Grover's disease   . Headache   . High cholesterol   . History of diverticulitis of colon   . History of renal calculi   . Hypertension   . Hypothyroidism   . Labyrinthitis   . Parkinson's disease ([place])   . Parkinson's disease ([place])   . Prostate atrophy   . Renal calculi   . Stroke [facility])    Past Surgical History:  Procedure Laterality Date  . CATARACT EXTRACTION, BILATERAL    . CHOLECYSTECTOMY  02/04/2012   Procedure: LAPAROSCOPIC CHOLECYSTECTOMY WITH INTRAOPERATIVE CHOLANGIOGRAM;  Surgeon: Merrie Roof, MD;  Location: [place];  Service: General;  Laterality: N/A;  . LITHOTRIPSY     renal calculi  . TONSILLECTOMY     Social History:   reports that he has quit smoking. He has never used smokeless tobacco. He reports that he drinks about 4.2 oz of alcohol per week . He reports that he does not use drugs.  Family History  Problem Relation Age of Onset  . Parkinsonism Mother   . Heart disease Father   . Esophageal cancer Sister     Medications:   Medication List       Accurate as of 08/21/15  3:10 PM. Always use your most recent med list.          acetaminophen 325 MG tablet Commonly known as:  TYLENOL Take 650 mg by mouth at bedtime.   atorvastatin 20 MG tablet Commonly known as:  LIPITOR Take 20 mg by mouth  daily at 6 PM.   carbidopa-levodopa 25-250 MG tablet Commonly known as:  SINEMET IR TAKE 1 TABLET BY MOUTH 3 (THREE) TIMES DAILY.   docusate 50 MG/5ML liquid Commonly known as:  COLACE Take by mouth daily. Give 10 mL   doxazosin 2 MG tablet Commonly known as:  CARDURA Take 2 mg by mouth at bedtime.   feeding supplement (GLUCERNA 1.5 CAL) Liqd Place into feeding tube continuous. 240 cc via peg 5Xs per day followed by 175 mLs of water   lansoprazole 30 MG capsule Commonly known as:  PREVACID Take 30 mg by mouth daily at 12 noon.   levothyroxine 50 MCG tablet Commonly known as:  SYNTHROID, LEVOTHROID Take 50 mcg by mouth daily before breakfast.   losartan-hydrochlorothiazide 100-25 MG tablet Commonly known as:  HYZAAR Take 1 tablet by mouth daily.   pramipexole 0.25 MG tablet Commonly known as:  MIRAPEX Take 1 tablet (0.25 mg total) by mouth 3 (three) times daily.   selegiline 5 MG tablet Commonly known as:  ELDEPRYL Take 1 tablet (5 mg total) by mouth 2 (two) times daily with a meal.   warfarin 5 MG tablet Commonly known as:  COUMADIN Take 5 mg by mouth daily at 6 PM.       Immunizations: Immunization History  Administered Date(s) Administered  . PPD Test 08/18/2015     Physical Exam: Vitals:   08/21/15 1401  BP: (!) 100/54  Pulse: 79  Resp: 18  Temp: 97.3 F (36.3 C)  TempSrc: Oral  SpO2: 97%  Weight: 126 lb 3.2 oz (57.2 kg)  Height: 5\' 7"  (1.702 m)   Body mass index is 19.77 kg/m.  General- elderly male, frail and thin built, in no acute distress Head- normocephalic, atraumatic Nose- nno maxillary or frontal sinus tenderness, no nasal discharge Throat- moist mucus membrane Eyes- no pallor, no icterus, no discharge, normal conjunctiva, normal sclera Neck- no cervical lymphadenopathy Cardiovascular- normal s1,s2, no murmur Respiratory- bilateral clear to auscultation, no wheeze, no rhonchi, no crackles, no use of accessory muscles Abdomen-  bowel sounds present, soft, non tender Musculoskeletal- able to move all 4 extremities, generalized weakness Neurological- alert and oriented to person, place and time, has dysarthria Skin- warm and dry, peg tube site clean and dry Psychiatry- normal mood and affect    Labs reviewed: Basic Metabolic Panel:  Recent Labs  08/16/15 08/18/15  NA 143 141  K 4.0 4.4  BUN 22* 33*  CREATININE 1.1 1.2   CBC:  Recent Labs  08/18/15  WBC 10.6  HGB 10.0*  HCT 32*  PLT 443*    Radiological Exams: Chest X-Ray 08/11/15 Impression. No acute cardiopulmonary abnormality.  Ct Head 08/11/15 Impression. No acute intracranial abnormality. Chronic left MCA territory infarct.  ABD X-ray 08/12/15 Impression. 1.ddep penetration but no aspiration noted with nectar, honey and pudding consistency mixtures. 2. A significant amount of residuals noted in the valleculae and pyriform sinuses. 3. A nasogastric  tube is in place.  ABD X-ray 08/15/15 Impression. Corpak into stomach.  EGD with PEG placement 08/16/15 Impression. Percutaneous endoscopic gastrostomy performed with placement of a 24 fr gastrostomy tube. Esophagitis and possible tumor in proximal esophagus. Duodenal ulcer.   Assessment/Plan  Generalized weakness Will have patient work with PT/OT as tolerated to regain strength and restore function.  Fall precautions are in place.  Unsteady gait With his parkinson's syndrome. Will have him work with physical therapy and occupational therapy team to help with gait training and muscle strengthening exercises.fall precautions. Skin care. Encourage to be out of bed.   Dysphagia S/p peg tube placement. Continue tube feed glucerna and medications via peg tube. Monitor clinically and continue peg tube care. Get SLP to evaluate  Dysarthria Get SLP to work with patient.  Hypothyroidism Continue levothyroxine and check tsh  constipation On colace daily, change this to bid for now and  monitor  History of CVA Continue coumadin, check inr for goal inr 2-3  esophagitis Continue lansoprazole 30 mg daily for now and monitor  Duodenal ulcer Continue lansoprazole, pending biopsy result  HLD Continue lipitor  BPH Continue cardura  HTN bp reading soft today, add holding parameter to hyzaar and continue this, monitor bmp  Parkinson's syndrome Continue sinemet 25-250 mg tid at 6 am, 11 am and 5 pm. Continue mirapex and seligiline  Protein calorie malnutrition Get RD to evaluate, weekly weight  Blood loss anemia Post op, monitor cbc    Goals of care: short term rehabilitation   Labs/tests ordered: cbc, cmp 08/23/15  Family/ staff Communication: reviewed care plan with patient, his friend and nursing supervisor    Blanchie Serve, MD Internal Medicine [facility] [address] [place],  [address] Cell Phone (Monday-Friday 8 am - 5 pm): [phone] On Call: [phone] and follow prompts after 5 pm and on weekends Office Phone: [phone] Office Fax: [phone]

## 2015-08-22 ENCOUNTER — Ambulatory Visit: Payer: Medicare Other | Admitting: Physical Therapy

## 2015-08-22 ENCOUNTER — Encounter: Payer: Medicare Other | Admitting: Occupational Therapy

## 2015-08-23 ENCOUNTER — Non-Acute Institutional Stay (SKILLED_NURSING_FACILITY): Payer: Medicare Other | Admitting: Family

## 2015-08-23 ENCOUNTER — Ambulatory Visit: Payer: Medicare Other | Admitting: Physical Therapy

## 2015-08-23 ENCOUNTER — Encounter: Payer: Medicare Other | Admitting: Occupational Therapy

## 2015-08-23 DIAGNOSIS — E039 Hypothyroidism, unspecified: Secondary | ICD-10-CM

## 2015-08-23 MED ORDER — LEVOTHYROXINE SODIUM 75 MCG PO TABS: 75.0000 ug | ORAL_TABLET | Freq: Every day | ORAL | Status: AC

## 2015-08-23 NOTE — Progress Notes (Signed)
Location:   [place] of Service:  SNF (31) Provider:  Zakary Kimura FNP-C   Kandice Hams, MD  Patient Care Team: Seward Carol, MD as PCP - General (Internal Medicine)  Extended Emergency Contact Information Primary Emergency Contact: [facility] Address: [address]          [place], [place] [address] [name] of [place] Phone: [phone] Mobile Phone: [phone] Relation: Friend  Code Status:  DNR  Goals of care: Advanced Directive information Advanced Directives 08/21/2015  Does patient have an advance directive? Yes  Type of Advance Directive Out of facility DNR (pink MOST or yellow form)  Does patient want to make changes to advanced directive? No - Patient declined  Copy of advanced directive(s) in chart? Yes  Pre-existing out of facility DNR order (yellow form or pink MOST form) -     Chief Complaint  Patient presents with  . Acute Visit    abnormal lab results     HPI:  Pt is a 82 y.o. male seen today at [facility] for an acute visit for evaluation of abnormal lab results. He is seen in his room today. He states feeling tired after working with Physical therapy. He denies any fever,chills or cough. He continues to work with speech therapist for swallowing. His TSH lab result was 5.643 ( 08/23/2015).    Past Medical History:  Diagnosis Date  . Amaurosis fugax 08/16/2014   Right eye  . Anticoagulated on Coumadin   . Benign enlargement of prostate   . Cataracts, bilateral   . CVA (cerebral infarction)   . GERD (gastroesophageal reflux disease)   . Grover's disease   . Headache   . High cholesterol   . History of diverticulitis of colon   . History of renal calculi   . Hypertension   . Hypothyroidism   . Labyrinthitis   . Parkinson's disease ([place])   . Parkinson's disease ([place])   . Prostate atrophy   . Renal calculi   . Stroke [facility])    Past Surgical History:  Procedure Laterality  Date  . CATARACT EXTRACTION, BILATERAL    . CHOLECYSTECTOMY  02/04/2012   Procedure: LAPAROSCOPIC CHOLECYSTECTOMY WITH INTRAOPERATIVE CHOLANGIOGRAM;  Surgeon: Merrie Roof, MD;  Location: [place];  Service: General;  Laterality: N/A;  . LITHOTRIPSY     renal calculi  . TONSILLECTOMY      Allergies  Allergen Reactions  . Ace Inhibitors Other (See Comments)    Cough      Medication List       Accurate as of 08/23/15  6:30 PM. Always use your most recent med list.          acetaminophen 325 MG tablet Commonly known as:  TYLENOL Take 650 mg by mouth at bedtime.   atorvastatin 20 MG tablet Commonly known as:  LIPITOR Take 20 mg by mouth daily at 6 PM.   carbidopa-levodopa 25-250 MG tablet Commonly known as:  SINEMET IR TAKE 1 TABLET BY MOUTH 3 (THREE) TIMES DAILY.   docusate 50 MG/5ML liquid Commonly known as:  COLACE Take by mouth daily. Give 10 mL   doxazosin 2 MG tablet Commonly known as:  CARDURA Take 2 mg by mouth at bedtime.   feeding supplement (GLUCERNA 1.5 CAL) Liqd Place into feeding tube continuous. 240 cc via peg 5Xs per day followed by 175 mLs of water   lansoprazole 30 MG capsule Commonly known as:  PREVACID Take 30 mg by mouth daily at 12 noon.   levothyroxine 75 MCG tablet Commonly known as:  SYNTHROID, LEVOTHROID Take 1 tablet (75 mcg total) by mouth daily before breakfast.   losartan-hydrochlorothiazide 100-25 MG tablet Commonly known as:  HYZAAR Take 1 tablet by mouth daily.   pramipexole 0.25 MG tablet Commonly known as:  MIRAPEX Take 1 tablet (0.25 mg total) by mouth 3 (three) times daily.   selegiline 5 MG tablet Commonly known as:  ELDEPRYL Take 1 tablet (5 mg total) by mouth 2 (two) times daily with a meal.   warfarin 5 MG tablet Commonly known as:  COUMADIN Take 5 mg by mouth daily at 6 PM.       Review of Systems  Constitutional: Negative for activity change, appetite change, chills, fatigue and fever.  HENT: Positive for  trouble swallowing. Negative for congestion, rhinorrhea, sinus pressure, sneezing and sore throat.   Eyes: Negative.   Respiratory: Negative for cough, chest tightness, shortness of breath and wheezing.   Cardiovascular: Negative for chest pain, palpitations and leg swelling.  Gastrointestinal: Negative for abdominal distention, abdominal pain, constipation, diarrhea, nausea and vomiting.       LBM 08/22/2015   Endocrine: Negative for cold intolerance and heat intolerance.  Genitourinary: Negative for dysuria, frequency and urgency.  Musculoskeletal: Positive for gait problem.  Skin: Negative for color change, pallor and rash.  Neurological: Negative for dizziness, tremors, seizures, syncope, light-headedness and headaches.  Psychiatric/Behavioral: Negative for agitation, confusion, hallucinations and sleep disturbance. The patient is not nervous/anxious.     Immunization History  Administered Date(s) Administered  . PPD Test 08/18/2015   Pertinent  Health Maintenance Due  Topic Date Due  . PNA vac Low Risk Adult (1 of 2 - PCV13) 01/07/1994  . INFLUENZA VACCINE  10/23/2015 (Originally 08/22/2015)   Fall Risk  12/22/2014 09/14/2014  Falls in the past year? Yes Yes  Number falls in past yr: 2 or more 2 or more  Injury with Fall? Yes No  Risk Factor Category  - High Fall Risk  Risk for fall due to : - History of fall(s);Impaired balance/gait;Impaired mobility  Follow up - Falls prevention discussed   Functional Status Survey:    Vitals:   08/23/15 1824  BP: 113/71  Pulse: 68  Resp: 18  Temp: 97.1 F (36.2 C)  SpO2: 98%  Weight: 126 lb (57.2 kg)  Height: 5\' 7"  (1.702 m)   Body mass index is 19.73 kg/m. Physical Exam  Constitutional: He is oriented to person, place, and time.  Thin Frail Elderly in no acute distress.Speaking in low tone during visit.   HENT:  Head: Normocephalic.  Mouth/Throat: Oropharynx is clear and moist. No oropharyngeal exudate.  Eyes: Conjunctivae and  EOM are normal. Pupils are equal, round, and reactive to light. Right eye exhibits no discharge. Left eye exhibits no discharge. No scleral icterus.  Neck: Normal range of motion. No JVD present. No thyromegaly present.  Cardiovascular: Normal rate, regular rhythm, normal heart sounds and intact distal pulses.  Exam reveals no gallop and no friction rub.   No murmur heard. Pulmonary/Chest: Effort normal and breath sounds normal. No respiratory distress. He has no wheezes. He has no rales.  Abdominal: Soft. Bowel sounds are normal. He exhibits no distension. There is no tenderness. There is no rebound and no guarding.  PEG tube site without any signs of infections.   Musculoskeletal: Normal range of motion. He exhibits no edema, tenderness or deformity.  Lymphadenopathy:  He has no cervical adenopathy.  Neurological: He is oriented to person, place, and time.  Skin: Skin is warm and dry. No rash noted. No erythema. No pallor.  Psychiatric: He has a normal mood and affect.    Labs reviewed:  Recent Labs  08/16/15 08/18/15  NA 143 141  K 4.0 4.4  BUN 22* 33*  CREATININE 1.1 1.2   No results for input(s): AST, ALT, ALKPHOS, BILITOT, PROT, ALBUMIN in the last 8760 hours.  Recent Labs  08/18/15  WBC 10.6  HGB 10.0*  HCT 32*  PLT 443*   Lab Results  Component Value Date   TSH 1.530 01/18/2014   Lab Results  Component Value Date   HGBA1C 6.0 (H) 01/19/2014   Lab Results  Component Value Date   CHOL 157 01/19/2014   HDL 57 01/19/2014   LDLCALC 92 01/19/2014   TRIG 41 01/19/2014   CHOLHDL 2.8 01/19/2014    Assessment/Plan Hypothyroidism, unspecified hypothyroidism type TSH lab result 5.643 ( 08/23/2015). Currently on levothyroxine 50 mcg Tablet daily. Will increase to 75 mcg Tablet daily before breakfast. Recheck TSH level in 8 weeks. Continue to monitor.     Family/ staff Communication: Reviewed plan of care with patient and facility Nurse supervisor  Labs/tests  ordered:  TSH level in 8 weeks.

## 2015-08-24 ENCOUNTER — Non-Acute Institutional Stay (SKILLED_NURSING_FACILITY): Payer: Medicare Other | Admitting: Internal Medicine

## 2015-08-24 ENCOUNTER — Telehealth: Payer: Self-pay | Admitting: Occupational Therapy

## 2015-08-24 ENCOUNTER — Encounter: Payer: Self-pay | Admitting: Occupational Therapy

## 2015-08-24 ENCOUNTER — Encounter: Payer: Self-pay | Admitting: Internal Medicine

## 2015-08-24 DIAGNOSIS — K228 Other specified diseases of esophagus: Secondary | ICD-10-CM

## 2015-08-24 DIAGNOSIS — K229 Disease of esophagus, unspecified: Secondary | ICD-10-CM

## 2015-08-24 DIAGNOSIS — K2289 Other specified disease of esophagus: Secondary | ICD-10-CM

## 2015-08-24 DIAGNOSIS — R531 Weakness: Secondary | ICD-10-CM

## 2015-08-24 DIAGNOSIS — F4321 Adjustment disorder with depressed mood: Secondary | ICD-10-CM

## 2015-08-24 DIAGNOSIS — R1310 Dysphagia, unspecified: Secondary | ICD-10-CM | POA: Diagnosis not present

## 2015-08-24 NOTE — Progress Notes (Signed)
LOCATION: [facility]  PCP: [place] Hams, MD   Code Status: DNR  Goals of care: Advanced Directive information Advanced Directives 08/21/2015  Does patient have an advance directive? Yes  Type of Advance Directive Out of facility DNR (pink MOST or yellow form)  Does patient want to make changes to advanced directive? No - Patient declined  Copy of advanced directive(s) in chart? Yes  Pre-existing out of facility DNR order (yellow form or pink MOST form) -       Extended Emergency Contact Information Primary Emergency Contact: [facility] Address: [address]          [place], [place] [address] [name] of [place] Phone: [phone] Mobile Phone: [phone] Relation: Friend   Allergies  Allergen Reactions  . Ace Inhibitors Other (See Comments)    Cough    Chief Complaint  Patient presents with  . Acute Visit    Family Concerns     HPI:  Patient is a 82 y.o. male seen today for acute visit. His partner is present in the room and has questions about him being seen by GI and cancer specialist. he received a call from the hospital where patient had esophageal biopsy saying that the biopsy result showed cancerous cells. No report is available at the facility for review. Patient is here for short term rehabilitation post hospital admission from 08/11/15-08/18/15 with acute on chronic dysphagia. He now has peg tube and gets his medication and feed via peg tube. His EGD showed esophagitis, duodenal ulcer and possible esophageal mass.    Review of Systems:  Constitutional: Negative for fever. Energy level is slowly coming back.  HENT: Negative for headache Respiratory: Negative for cough, shortness of breath.   Cardiovascular: Negative for chest pain Gastrointestinal: Negative for nausea, vomiting, abdominal pain.    Past Medical History:  Diagnosis Date  . Amaurosis fugax 08/16/2014   Right eye  . Anticoagulated on Coumadin   . Benign  enlargement of prostate   . Cataracts, bilateral   . CVA (cerebral infarction)   . GERD (gastroesophageal reflux disease)   . Grover's disease   . Headache   . High cholesterol   . History of diverticulitis of colon   . History of renal calculi   . Hypertension   . Hypothyroidism   . Labyrinthitis   . Parkinson's disease ([place])   . Parkinson's disease ([place])   . Prostate atrophy   . Renal calculi   . Stroke [facility])      Medications:   Medication List       Accurate as of 08/24/15  3:10 PM. Always use your most recent med list.          acetaminophen 325 MG tablet Commonly known as:  TYLENOL Take 650 mg by mouth at bedtime.   atorvastatin 20 MG tablet Commonly known as:  LIPITOR Take 20 mg by mouth daily at 6 PM.   carbidopa-levodopa 25-250 MG tablet Commonly known as:  SINEMET IR TAKE 1 TABLET BY MOUTH 3 (THREE) TIMES DAILY.   docusate 50 MG/5ML liquid Commonly known as:  COLACE Place into feeding tube daily. Give 10 mL   doxazosin 2 MG tablet Commonly known as:  CARDURA Take 2 mg by mouth at bedtime.   feeding supplement (GLUCERNA 1.5 CAL) Liqd Place into feeding tube continuous. 240 cc via peg 5Xs per day followed by 175 mLs of water   lansoprazole 30 MG capsule Commonly known as:  PREVACID Take 30 mg  by mouth daily at 12 noon.   levothyroxine 75 MCG tablet Commonly known as:  SYNTHROID, LEVOTHROID Take 1 tablet (75 mcg total) by mouth daily before breakfast.   losartan-hydrochlorothiazide 100-25 MG tablet Commonly known as:  HYZAAR Take 1 tablet by mouth daily.   pramipexole 0.25 MG tablet Commonly known as:  MIRAPEX Take 1 tablet (0.25 mg total) by mouth 3 (three) times daily.   selegiline 5 MG tablet Commonly known as:  ELDEPRYL Take 1 tablet (5 mg total) by mouth 2 (two) times daily with a meal.   warfarin 5 MG tablet Commonly known as:  COUMADIN Take 5.5 mg by mouth daily.       Immunizations: Immunization History  Administered  Date(s) Administered  . PPD Test 08/18/2015     Physical Exam: Vitals:   08/24/15 1453  BP: 110/61  Pulse: 84  Resp: 16  Temp: (!) 96.6 F (35.9 C)  TempSrc: Oral  SpO2: 97%  Weight: 126 lb (57.2 kg)  Height: 5\' 7"  (1.702 m)   Body mass index is 19.73 kg/m.  General- elderly male, frail and thin built, in no acute distress Head- normocephalic, atraumatic Throat- moist mucus membrane Cardiovascular- normal s1,s2, no murmur Respiratory- bilateral clear to auscultation, no wheeze, no rhonchi, no crackles, no use of accessory muscles Abdomen- bowel sounds present, soft, non tender, peg tube site clean and dry Musculoskeletal- able to move all 4 extremities, generalized weakness Psychiatry- somewhat flat affect    Labs reviewed: Basic Metabolic Panel:  Recent Labs  08/16/15 08/18/15  NA 143 141  K 4.0 4.4  BUN 22* 33*  CREATININE 1.1 1.2   CBC:  Recent Labs  08/18/15  WBC 10.6  HGB 10.0*  HCT 32*  PLT 443*    Radiological Exams: Chest X-Ray 08/11/15 Impression. No acute cardiopulmonary abnormality.  Ct Head 08/11/15 Impression. No acute intracranial abnormality. Chronic left MCA territory infarct.  ABD X-ray 08/12/15 Impression. 1.ddep penetration but no aspiration noted with nectar, honey and pudding consistency mixtures. 2. A significant amount of residuals noted in the valleculae and pyriform sinuses. 3. A nasogastric tube is in place.  ABD X-ray 08/15/15 Impression. Corpak into stomach.  EGD with PEG placement 08/16/15 Impression. Percutaneous endoscopic gastrostomy performed with placement of a 24 fr gastrostomy tube. Esophagitis and possible tumor in proximal esophagus. Duodenal ulcer.   Assessment/Plan  Esophageal mass Per patient's friend report of biopsy is suggestive of cancer. Have asked our medical record to get the biopsy result from [facility]. Will make appointment with GI and oncology to discuss further treatment  plan  Generalized weakness Will have patient work with PT/OT as tolerated to regain strength and restore function.  Fall precautions are in place.  Dysphagia S/p peg tube placement. Continue tube feed and oral care  Depression Patient participates minimally in conversation today. His friend/ partner is upset regarding [place] not having a copy of the biopsy result. Explained to him that the original report must have been faxed to the PCP office. Will have our MDS contact the hospital and get a copy of the result and review it. Also explained about making appointment with oncology and GI team for further follow up. Will get psychiatry consult with concern for situational depression for the patient.    Family/ staff Communication: reviewed care plan with patient, his friend and Therapist, art. Answered question from patient's partner.     Blanchie Serve, MD Internal Medicine [facility] [phone]  [place], [place] [address] Cell Phone (Monday-Friday 8 am - 5 pm): [phone] On Call: [phone] and follow prompts after 5 pm and on weekends Office Phone: [phone] Office Fax: [phone]

## 2015-08-24 NOTE — Telephone Encounter (Signed)
Called and spoke to pt's caregiver, [name].  [name] reports that pt is still in SNF but was just diagnosed with esophageal cancer.  [name] reports that pt will need home health therapies after d/c from SNF and therefore, outpatient therapies will be d/c at this time.  Caregiver agreed.

## 2015-08-24 NOTE — Therapy (Signed)
[facility] [address] [place], [place], [address] Phone: [phone]   Fax:  [phone]  Patient Details  Name: Kyle Cameron MRN: [id number] Date of Birth: [birth year] Referring Provider:  No ref. provider found  Encounter Date: 08/24/2015  OCCUPATIONAL THERAPY DISCHARGE SUMMARY  Visits from Start of Care: 1 (eval)  Current functional level related to goals / functional outcomes: See eval--pt was not seen after OT evaluation   Remaining deficits: See eval as pt was not seen after evaluation   Education / Equipment: Unable to complete due to pt not returning to OT  Plan: Patient agrees to discharge.  Patient goals were not met. Patient is being discharged due to a change in medical status.  Pt was hospitalized with PEG tuble placement and then discharged to SNF.  Anticipate that pt will then have home therapies after d/c from SNF.  Caregiver agrees with d/c at this time.  ?????         [facility] 08/24/2015, 5:01 PM  [facility] [address] [place], [place], [address] Phone: [phone]   Fax:  [place], OTR/L [facility] [address] [place], [place]  [address] [phone] phone [phone] 08/24/15 5:03 PM

## 2015-08-25 ENCOUNTER — Encounter: Payer: Self-pay | Admitting: Physical Therapy

## 2015-08-25 NOTE — Therapy (Signed)
[facility] [address] [place], [place], [address] Phone: [phone]   Fax:  [phone]  Patient Details  Name: Kyle Cameron MRN: [id number] Date of Birth: 11/09/1926 Referring Provider:  No ref. provider found  Encounter Date: 2015/08/29       G-Codes - 2015/08/29 1957    Functional Assessment Tool Used unable to assess, as pt did not return-   Functional Limitation Mobility: Walking and moving around   Mobility: Walking and Moving Around Goal Status [phone]) At least 40 percent but less than 60 percent impaired, limited or restricted   Mobility: Walking and Moving Around Discharge Status [phone]) At least 60 percent but less than 80 percent impaired, limited or restricted    PHYSICAL THERAPY DISCHARGE SUMMARY  Visits from Start of Care: 1 (eval only)  Current functional level related to goals / functional outcomes: See eval for detail   Remaining deficits: See eval   Education / Equipment: Unable to complete,as pt did not return after evaluation.  Plan: Patient agrees to discharge.  Patient goals were not met. Patient is being discharged due to a change in medical status.  ?????Pt was hospitalized with PEG tuble placement and then discharged to SNF.  Anticipate that pt will then have home therapies after d/c from SNF.  Caregiver agrees with d/c at this time.  ?????      Kyle Cameron W. 08-29-15, 7:57 PM  Frazier Butt., PT  [facility] [address] [place], [place], [address] Phone: [phone]   Fax:  [phone]

## 2015-08-28 ENCOUNTER — Non-Acute Institutional Stay (SKILLED_NURSING_FACILITY): Payer: Medicare Other | Admitting: Family

## 2015-08-28 DIAGNOSIS — S81811A Laceration without foreign body, right lower leg, initial encounter: Secondary | ICD-10-CM

## 2015-08-28 DIAGNOSIS — R1310 Dysphagia, unspecified: Secondary | ICD-10-CM | POA: Diagnosis not present

## 2015-08-28 DIAGNOSIS — S81801A Unspecified open wound, right lower leg, initial encounter: Secondary | ICD-10-CM

## 2015-08-28 NOTE — Progress Notes (Signed)
Location:   [place] of Service:  SNF (31) Provider:  Dinah Ngetich FNP-C   Kandice Hams, MD  Patient Care Team: Seward Carol, MD as PCP - General (Internal Medicine)  Extended Emergency Contact Information Primary Emergency Contact: [facility] Address: [address]          [place], [place] [address] [name] of [place] Phone: [phone] Mobile Phone: [phone] Relation: Friend  Code Status:  DNR  Goals of care: Advanced Directive information Advanced Directives 08/21/2015  Does patient have an advance directive? Yes  Type of Advance Directive Out of facility DNR (pink MOST or yellow form)  Does patient want to make changes to advanced directive? No - Patient declined  Copy of advanced directive(s) in chart? Yes  Pre-existing out of facility DNR order (yellow form or pink MOST form) -     Chief Complaint  Patient presents with  . Acute Visit    HPI:  Pt is a 82 y.o. male seen today at [facility] for an acute visit for follow up abnormal biopsy results. He is seen in his room today. He denies any acute issues. His recent Esophagus Biopsy result showed Adenocarcinoma.Results discussed with patient and his [place] on the phone at [phone]. Patient's POA requested results faxed to Oncologist and GI at [facility]. Esophagus Biopsy result already faxed by facility scheduler awaiting appointment date. Patient and POA notified. Patient scheduled for FEES 08/29/2015 to determine safety for potential pleasure feedings and drinks. Facility staff reports no new concerns.    Past Medical History:  Diagnosis Date  . Amaurosis fugax 08/16/2014   Right eye  . Anticoagulated on Coumadin   . Benign enlargement of prostate   . Cataracts, bilateral   . CVA (cerebral infarction)   . GERD (gastroesophageal reflux disease)   . Grover's disease   . Headache   . High cholesterol   . History of  diverticulitis of colon   . History of renal calculi   . Hypertension   . Hypothyroidism   . Labyrinthitis   . Parkinson's disease ([place])   . Parkinson's disease ([place])   . Prostate atrophy   . Renal calculi   . Stroke [facility])    Past Surgical History:  Procedure Laterality Date  . CATARACT EXTRACTION, BILATERAL    . CHOLECYSTECTOMY  02/04/2012   Procedure: LAPAROSCOPIC CHOLECYSTECTOMY WITH INTRAOPERATIVE CHOLANGIOGRAM;  Surgeon: Merrie Roof, MD;  Location: [place];  Service: General;  Laterality: N/A;  . LITHOTRIPSY     renal calculi  . TONSILLECTOMY      Allergies  Allergen Reactions  . Ace Inhibitors Other (See Comments)    Cough      Medication List       Accurate as of 08/28/15  2:39 PM. Always use your most recent med list.          acetaminophen 325 MG tablet Commonly known as:  TYLENOL Take 650 mg by mouth at bedtime.   atorvastatin 20 MG tablet Commonly known as:  LIPITOR Take 20 mg by mouth daily at 6 PM.   carbidopa-levodopa 25-250 MG tablet Commonly known as:  SINEMET IR TAKE 1 TABLET BY MOUTH 3 (THREE) TIMES DAILY.   docusate 50 MG/5ML liquid Commonly known as:  COLACE Place into feeding tube daily. Give 10 mL   doxazosin 2 MG tablet Commonly known as:  CARDURA Take 2 mg by mouth at bedtime.  feeding supplement (GLUCERNA 1.5 CAL) Liqd Place into feeding tube continuous. 240 cc via peg 5Xs per day followed by 175 mLs of water   lansoprazole 30 MG capsule Commonly known as:  PREVACID Take 30 mg by mouth daily at 12 noon.   levothyroxine 75 MCG tablet Commonly known as:  SYNTHROID, LEVOTHROID Take 1 tablet (75 mcg total) by mouth daily before breakfast.   losartan-hydrochlorothiazide 100-25 MG tablet Commonly known as:  HYZAAR Take 1 tablet by mouth daily.   pramipexole 0.25 MG tablet Commonly known as:  MIRAPEX Take 1 tablet (0.25 mg total) by mouth 3 (three) times daily.   selegiline 5 MG tablet Commonly known as:  ELDEPRYL Take 1  tablet (5 mg total) by mouth 2 (two) times daily with a meal.   warfarin 6 MG tablet Commonly known as:  COUMADIN Take 6.5 mg by mouth daily.       Review of Systems  Constitutional: Negative for activity change, appetite change, chills, fatigue and fever.  HENT: Positive for trouble swallowing. Negative for congestion, rhinorrhea, sinus pressure, sneezing and sore throat.   Eyes: Negative.   Respiratory: Negative for cough, chest tightness, shortness of breath and wheezing.   Cardiovascular: Negative for chest pain, palpitations and leg swelling.  Gastrointestinal: Negative for abdominal distention, abdominal pain, constipation, diarrhea, nausea and vomiting.       LBM 08/27/2015   Endocrine: Negative for cold intolerance and heat intolerance.  Genitourinary: Negative for dysuria, frequency and urgency.  Musculoskeletal: Positive for gait problem.  Skin: Negative for color change, pallor and rash.       Right shin area small skin tear cause unknown.    Neurological: Negative for dizziness, tremors, seizures, syncope, light-headedness and headaches.  Psychiatric/Behavioral: Negative for agitation, confusion, hallucinations and sleep disturbance. The patient is not nervous/anxious.     Immunization History  Administered Date(s) Administered  . PPD Test 08/18/2015   Pertinent  Health Maintenance Due  Topic Date Due  . PNA vac Low Risk Adult (1 of 2 - PCV13) 01/07/1994  . INFLUENZA VACCINE  10/23/2015 (Originally 08/22/2015)   Fall Risk  12/22/2014 09/14/2014  Falls in the past year? Yes Yes  Number falls in past yr: 2 or more 2 or more  Injury with Fall? Yes No  Risk Factor Category  - High Fall Risk  Risk for fall due to : - History of fall(s);Impaired balance/gait;Impaired mobility  Follow up - Falls prevention discussed      Vitals:   08/28/15 1426  BP: 90/71  Pulse: 89  Resp: 18  Temp: 98.5 F (36.9 C)  SpO2: 97%  Weight: 126 lb (57.2 kg)  Height: 5\' 7"  (1.702 m)    Body mass index is 19.73 kg/m. Physical Exam  Constitutional: He is oriented to person, place, and time.  Thin Frail Elderly in no acute distress.Speaking in low tone during visit.   HENT:  Head: Normocephalic.  Mouth/Throat: Oropharynx is clear and moist. No oropharyngeal exudate.  Eyes: Conjunctivae and EOM are normal. Pupils are equal, round, and reactive to light. Right eye exhibits no discharge. Left eye exhibits no discharge. No scleral icterus.  Neck: Normal range of motion. No JVD present. No thyromegaly present.  Cardiovascular: Normal rate, regular rhythm, normal heart sounds and intact distal pulses.  Exam reveals no gallop and no friction rub.   No murmur heard. Pulmonary/Chest: Effort normal and breath sounds normal. No respiratory distress. He has no wheezes. He has no rales.  Abdominal: Soft. Bowel  sounds are normal. He exhibits no distension. There is no tenderness. There is no rebound and no guarding.  PEG tube site without any signs of infections.   Musculoskeletal: Normal range of motion. He exhibits no edema, tenderness or deformity.  Lymphadenopathy:    He has no cervical adenopathy.  Neurological: He is oriented to person, place, and time.  Skin: Skin is warm and dry. No rash noted. No erythema. No pallor.  Right shin area skin tear bright red dry blood noted.   Psychiatric: He has a normal mood and affect.    Labs reviewed:  Recent Labs  08/16/15 08/18/15  NA 143 141  K 4.0 4.4  BUN 22* 33*  CREATININE 1.1 1.2   No results for input(s): AST, ALT, ALKPHOS, BILITOT, PROT, ALBUMIN in the last 8760 hours.  Recent Labs  08/18/15  WBC 10.6  HGB 10.0*  HCT 32*  PLT 443*   Lab Results  Component Value Date   TSH 1.530 01/18/2014   Lab Results  Component Value Date   HGBA1C 6.0 (H) 01/19/2014   Lab Results  Component Value Date   CHOL 157 01/19/2014   HDL 57 01/19/2014   LDLCALC 92 01/19/2014   TRIG 41 01/19/2014   CHOLHDL 2.8 01/19/2014     Assessment/Plan 1. Dysphagia Biopsy results positive for Esophagus adenocarcinoma. Results faxed to [facility] Oncologist and GI specialist by facility scheduler. Awaiting appointment dates. POA and Patient aware. Also has FEES appointment 8/8 /2017  to determine safety for potential pleasure feedings and drinks. Continue with facility Speech Therapist.  2. Right leg Skin Tear  Facility Nurse to cleanse with Saline, pat dry and apply Vaseline gauze and cover with Tegaderm change every 3 days and PRN    Family/ staff Communication: Reviewed plan of care with patient and his [place] on the phone at [phone] and Pharmacist, hospital.  Labs/tests ordered:  None

## 2015-08-29 ENCOUNTER — Ambulatory Visit: Payer: Medicare Other | Admitting: Physical Therapy

## 2015-08-29 ENCOUNTER — Encounter: Payer: Medicare Other | Admitting: Speech Pathology

## 2015-08-29 ENCOUNTER — Encounter: Payer: Medicare Other | Admitting: Occupational Therapy

## 2015-08-30 ENCOUNTER — Non-Acute Institutional Stay (SKILLED_NURSING_FACILITY): Payer: Medicare Other | Admitting: Family

## 2015-08-30 DIAGNOSIS — E039 Hypothyroidism, unspecified: Secondary | ICD-10-CM | POA: Diagnosis not present

## 2015-08-30 DIAGNOSIS — G20 Parkinson's disease: Secondary | ICD-10-CM

## 2015-08-30 DIAGNOSIS — R269 Unspecified abnormalities of gait and mobility: Secondary | ICD-10-CM

## 2015-08-30 DIAGNOSIS — I63412 Cerebral infarction due to embolism of left middle cerebral artery: Secondary | ICD-10-CM | POA: Diagnosis not present

## 2015-08-30 DIAGNOSIS — C159 Malignant neoplasm of esophagus, unspecified: Secondary | ICD-10-CM

## 2015-08-30 DIAGNOSIS — E785 Hyperlipidemia, unspecified: Secondary | ICD-10-CM | POA: Diagnosis not present

## 2015-08-30 DIAGNOSIS — I10 Essential (primary) hypertension: Secondary | ICD-10-CM

## 2015-08-30 DIAGNOSIS — R1310 Dysphagia, unspecified: Secondary | ICD-10-CM

## 2015-08-30 NOTE — Progress Notes (Signed)
Location:    [facility] Room Number: 1205 Place of Service:  SNF (31)  Provider: Marlowe Sax FNP-C   PCP: Kandice Hams, MD Patient Care Team: Seward Carol, MD as PCP - General (Internal Medicine)  Extended Emergency Contact Information Primary Emergency Contact: [name],[name] Address: [address]          [place], [place] [address] [name] of [place] Phone: [phone] Mobile Phone: [phone] Relation: Friend  Code Status: DNR Goals of care:  Advanced Directive information Advanced Directives 08/21/2015  Does patient have an advance directive? Yes  Type of Advance Directive Out of facility DNR (pink MOST or yellow form)  Does patient want to make changes to advanced directive? No - Patient declined  Copy of advanced directive(s) in chart? Yes  Pre-existing out of facility DNR order (yellow form or pink MOST form) -     Allergies  Allergen Reactions  . Ace Inhibitors Other (See Comments)    Cough    Chief Complaint  Patient presents with  . Discharge Note    HPI:  82 y.o. male seen at  [facility] for discharge home.He has a medical history of Parkinson disease, HTN, Hyperlipidemia, CVA, Hypothyroidism among other conditions. He was here for short term rehabilitation post hospital admission from 08/11/15-08/18/15 with acute on chronic dysphagia. He was seen by GI team and underwent EGD showing esophagitis, duodenal ulcer and esophageal mass. He had biopsy result of which is results showed adenocarcinoma.He was treated with antibiotics for aspiration pneumonia. He also had AKI and received iv fluids. He had PEG tube placement and now receives medication and feed through peg tube.He receives Glucerna 1.5 bolus 5X per day with 175 water flushes. He is awaiting appointment date for oncology and GI evaluation at [facility] per family request.He has worked well with PT/OT now stable for discharge home.He  will be discharged home with Home health PT/OT to continue with ROM, Exercise, Gait stability and muscle strengthening. He will also require a Speech Therapy for dysphagia.He will need a HH RN for Bolus Tube feeding management. He will also need a HH Aid to assist with ADL's. He does not require any DME has own Rollator.Home health services will be arranged by facility social worker prior to discharge. Prescription medication will be written x 1 month then patient to follow up with PCP in 1-2 weeks.He denies any acute issues this visit. Facility staff report no new concerns.    Past Medical History:  Diagnosis Date  . Amaurosis fugax 08/16/2014   Right eye  . Anticoagulated on Coumadin   . Benign enlargement of prostate   . Cataracts, bilateral   . CVA (cerebral infarction)   . GERD (gastroesophageal reflux disease)   . Grover's disease   . Headache   . High cholesterol   . History of diverticulitis of colon   . History of renal calculi   . Hypertension   . Hypothyroidism   . Labyrinthitis   . Parkinson's disease ([place])   . Parkinson's disease ([place])   . Prostate atrophy   . Renal calculi   . Stroke [facility])     Past Surgical History:  Procedure Laterality Date  . CATARACT EXTRACTION, BILATERAL    . CHOLECYSTECTOMY  02/04/2012   Procedure: LAPAROSCOPIC CHOLECYSTECTOMY WITH INTRAOPERATIVE CHOLANGIOGRAM;  Surgeon: Merrie Roof, MD;  Location: [place];  Service: General;  Laterality: N/A;  . LITHOTRIPSY     renal  calculi  . TONSILLECTOMY        reports that he has quit smoking. He has never used smokeless tobacco. He reports that he drinks about 4.2 oz of alcohol per week . He reports that he does not use drugs. Social History   Social History  . Marital status: Single    Spouse name: N/A  . Number of children: 0  . Years of education: college   Occupational History  . Retired    Social History Main Topics  . Smoking status: Former Research scientist (life sciences)  . Smokeless tobacco: Never Used  .  Alcohol use 4.2 oz/week    7 Glasses of wine per week     Comment: Consumes 1 glass of wine daily  . Drug use: No  . Sexual activity: Not on file   Other Topics Concern  . Not on file   Social History Narrative   Patient is right handed.   Patient drinks about 1-2 cup caffeine daily.      Allergies  Allergen Reactions  . Ace Inhibitors Other (See Comments)    Cough    Pertinent  Health Maintenance Due  Topic Date Due  . PNA vac Low Risk Adult (1 of 2 - PCV13) 01/07/1994  . INFLUENZA VACCINE  10/23/2015 (Originally 08/22/2015)    Medications:   Medication List       Accurate as of 08/30/15  7:40 PM. Always use your most recent med list.          acetaminophen 325 MG tablet Commonly known as:  TYLENOL Take 650 mg by mouth at bedtime.   atorvastatin 20 MG tablet Commonly known as:  LIPITOR Take 20 mg by mouth daily at 6 PM.   carbidopa-levodopa 25-250 MG tablet Commonly known as:  SINEMET IR TAKE 1 TABLET BY MOUTH 3 (THREE) TIMES DAILY.   docusate 50 MG/5ML liquid Commonly known as:  COLACE Place into feeding tube daily. Give 10 mL   doxazosin 2 MG tablet Commonly known as:  CARDURA Take 2 mg by mouth at bedtime.   feeding supplement (GLUCERNA 1.5 CAL) Liqd Place into feeding tube continuous. 240 cc via peg 5Xs per day followed by 175 mLs of water   lansoprazole 30 MG capsule Commonly known as:  PREVACID Take 30 mg by mouth daily at 12 noon.   levothyroxine 75 MCG tablet Commonly known as:  SYNTHROID, LEVOTHROID Take 1 tablet (75 mcg total) by mouth daily before breakfast.   losartan-hydrochlorothiazide 100-25 MG tablet Commonly known as:  HYZAAR Take 1 tablet by mouth daily.   pramipexole 0.25 MG tablet Commonly known as:  MIRAPEX Take 1 tablet (0.25 mg total) by mouth 3 (three) times daily.   selegiline 5 MG tablet Commonly known as:  ELDEPRYL Take 1 tablet (5 mg total) by mouth 2 (two) times daily with a meal.   warfarin 6 MG  tablet Commonly known as:  COUMADIN Take 6.5 mg by mouth daily.       Review of Systems  Constitutional: Negative for activity change, appetite change, chills, fatigue and fever.  HENT: Positive for trouble swallowing. Negative for congestion, rhinorrhea, sinus pressure, sneezing and sore throat.   Eyes: Negative.   Respiratory: Negative for cough, chest tightness, shortness of breath and wheezing.   Cardiovascular: Negative for chest pain, palpitations and leg swelling.  Gastrointestinal: Negative for abdominal distention, abdominal pain, constipation, diarrhea, nausea and vomiting.       LBM 08/29/2015   Endocrine: Negative for cold intolerance and heat  intolerance.  Genitourinary: Negative for dysuria, frequency and urgency.  Musculoskeletal: Positive for gait problem.       Generalized weakness   Skin: Negative for color change, pallor and rash.       Right shin area small skin tear.    Neurological: Negative for dizziness, tremors, seizures, syncope, light-headedness and headaches.  Hematological: Does not bruise/bleed easily.  Psychiatric/Behavioral: Negative for agitation, confusion, hallucinations and sleep disturbance. The patient is not nervous/anxious.     Vitals:   08/30/15 1230  BP: 107/64  Pulse: 70  Resp: 18  Temp: 97.5 F (36.4 C)  SpO2: 100%  Weight: 126 lb (57.2 kg)  Height: 5\' 7"  (1.702 m)   Body mass index is 19.73 kg/m. Physical Exam  Constitutional: He is oriented to person, place, and time.  Thin Frail Elderly in no acute distress.Speaking in low tone during visit.   HENT:  Head: Normocephalic.  Mouth/Throat: Oropharynx is clear and moist. No oropharyngeal exudate.  Eyes: Conjunctivae and EOM are normal. Pupils are equal, round, and reactive to light. Right eye exhibits no discharge. Left eye exhibits no discharge. No scleral icterus.  Neck: Normal range of motion. No JVD present. No thyromegaly present.  Cardiovascular: Normal rate, regular rhythm,  normal heart sounds and intact distal pulses.  Exam reveals no gallop and no friction rub.   No murmur heard. Pulmonary/Chest: Effort normal and breath sounds normal. No respiratory distress. He has no wheezes. He has no rales.  Abdominal: Soft. Bowel sounds are normal. He exhibits no distension. There is no tenderness. There is no rebound and no guarding.  PEG tube site without any signs of infections.   Musculoskeletal: Normal range of motion. He exhibits no edema, tenderness or deformity.  Lymphadenopathy:    He has no cervical adenopathy.  Neurological: He is oriented to person, place, and time.  Skin: Skin is warm and dry. No rash noted. No erythema. No pallor.  Right shin area skin tear progressive healing.DRSG dry, clean and intact.   Psychiatric: He has a normal mood and affect.    Labs reviewed: Basic Metabolic Panel:  Recent Labs  08/16/15 08/18/15  NA 143 141  K 4.0 4.4  BUN 22* 33*  CREATININE 1.1 1.2   Liver Function Tests: No results for input(s): AST, ALT, ALKPHOS, BILITOT, PROT, ALBUMIN in the last 8760 hours. No results for input(s): LIPASE, AMYLASE in the last 8760 hours. No results for input(s): AMMONIA in the last 8760 hours. CBC:  Recent Labs  08/18/15  WBC 10.6  HGB 10.0*  HCT 32*  PLT 443*   Assessment/Plan:   1. Adenocarcinoma of esophagus ([place]) Status post short term rehabilitation post hospital admission from 08/11/15-08/18/15 with acute on chronic dysphagia. He was seen by GI team and underwent EGD showing esophagitis, duodenal ulcer and esophageal mass. He had biopsy result of which is results showed adenocarcinoma.He was treated with antibiotics for aspiration pneumonia.He had PEG tube placement and now receives medication and feed through peg tube.Continue on Glucerna 1.5 bolus 5X per day with 175 water flushes. Follow up with oncology and GI at [facility] for Bolus Tube feeding management.  2. Essential hypertension B/p stable. Continue  Hyzaar 100-25 mg Tablet. BMP in 1-2 weeks with PCP  3. Dysphagia NPO.[place] ST to monitor. Continue on Glucerna 1.5 bolus 5X per day with 175 water flushes. Aspiration precautions.   4. Hypothyroidism, unspecified hypothyroidism type Continue on Levothyroxine 75 mcg Tablet. TSH level with PCP   5.  PD (Parkinson's disease) ([place]) Progressive decline expected.continue Sinemet 25-250 mg Tablet. [place] Aid  to assist with ADL's.Fall and safety precautions.   6. Hyperlipidemia Continue on Lipitor 20 mg Tablet. Monitor Lipid panel periodically.   7. Abnormality of gait Has worked well with PT/ OT. Will discharge home PT/OT to continue with ROM, Exercise, Gait stability and muscle strengthening. Has own DME Rollator .Fall and safety precautions.   8. Cerebral infarction due to embolism of left middle cerebral artery (HCC) Continue on Coumadin. [place] RN for  INR check.     Patient is being discharged with the following home health services:   PT/OT to continue with ROM, Exercise, Gait stability and muscle strengthening.  A Speech Therapy for dysphagia.   [place] RN for Bolus Tube feeding management and INR check.   A HH Aid to assist with ADL's.    Patient is being discharged with the following durable medical equipment:   No DME required. Has own DME Rollator   Patient has been advised to f/u with their PCP in 1-2 weeks to bring them up to date on their rehab stay.  Social services at facility was responsible for arranging this appointment.  Pt was provided with a 30 day supply of prescriptions for medications and refills must be obtained from their PCP.  For controlled substances, a more limited supply may be provided adequate until PCP appointment only.  Future labs/tests needed:  CBC, BMP and INR in 1 week with PCP

## 2015-08-31 ENCOUNTER — Encounter: Payer: Medicare Other | Admitting: Occupational Therapy

## 2015-08-31 ENCOUNTER — Ambulatory Visit: Payer: Medicare Other | Admitting: Physical Therapy

## 2015-09-01 ENCOUNTER — Encounter (HOSPITAL_COMMUNITY): Payer: Self-pay | Admitting: *Deleted

## 2015-09-01 ENCOUNTER — Inpatient Hospital Stay (HOSPITAL_COMMUNITY)
Admission: EM | Admit: 2015-09-01 | Discharge: 2015-09-22 | DRG: 393 | Disposition: E | Payer: Medicare Other | Attending: Internal Medicine | Admitting: Internal Medicine

## 2015-09-01 DIAGNOSIS — I10 Essential (primary) hypertension: Secondary | ICD-10-CM | POA: Diagnosis present

## 2015-09-01 DIAGNOSIS — Z79899 Other long term (current) drug therapy: Secondary | ICD-10-CM

## 2015-09-01 DIAGNOSIS — K9423 Gastrostomy malfunction: Secondary | ICD-10-CM | POA: Diagnosis present

## 2015-09-01 DIAGNOSIS — Z431 Encounter for attention to gastrostomy: Principal | ICD-10-CM

## 2015-09-01 DIAGNOSIS — G20 Parkinson's disease: Secondary | ICD-10-CM | POA: Diagnosis present

## 2015-09-01 DIAGNOSIS — E039 Hypothyroidism, unspecified: Secondary | ICD-10-CM | POA: Diagnosis present

## 2015-09-01 DIAGNOSIS — Z5309 Procedure and treatment not carried out because of other contraindication: Secondary | ICD-10-CM

## 2015-09-01 DIAGNOSIS — Z7189 Other specified counseling: Secondary | ICD-10-CM

## 2015-09-01 DIAGNOSIS — I69391 Dysphagia following cerebral infarction: Secondary | ICD-10-CM

## 2015-09-01 DIAGNOSIS — R059 Cough, unspecified: Secondary | ICD-10-CM

## 2015-09-01 DIAGNOSIS — R1310 Dysphagia, unspecified: Secondary | ICD-10-CM

## 2015-09-01 DIAGNOSIS — Z66 Do not resuscitate: Secondary | ICD-10-CM | POA: Diagnosis present

## 2015-09-01 DIAGNOSIS — R05 Cough: Secondary | ICD-10-CM

## 2015-09-01 DIAGNOSIS — R0602 Shortness of breath: Secondary | ICD-10-CM

## 2015-09-01 DIAGNOSIS — E782 Mixed hyperlipidemia: Secondary | ICD-10-CM | POA: Diagnosis present

## 2015-09-01 DIAGNOSIS — C153 Malignant neoplasm of upper third of esophagus: Secondary | ICD-10-CM | POA: Diagnosis present

## 2015-09-01 DIAGNOSIS — Z87891 Personal history of nicotine dependence: Secondary | ICD-10-CM

## 2015-09-01 DIAGNOSIS — C7800 Secondary malignant neoplasm of unspecified lung: Secondary | ICD-10-CM | POA: Diagnosis present

## 2015-09-01 DIAGNOSIS — Z515 Encounter for palliative care: Secondary | ICD-10-CM | POA: Diagnosis not present

## 2015-09-01 DIAGNOSIS — I252 Old myocardial infarction: Secondary | ICD-10-CM

## 2015-09-01 DIAGNOSIS — Z7901 Long term (current) use of anticoagulants: Secondary | ICD-10-CM

## 2015-09-01 DIAGNOSIS — J189 Pneumonia, unspecified organism: Secondary | ICD-10-CM | POA: Diagnosis present

## 2015-09-01 DIAGNOSIS — R1312 Dysphagia, oropharyngeal phase: Secondary | ICD-10-CM | POA: Diagnosis present

## 2015-09-01 DIAGNOSIS — K9420 Gastrostomy complication, unspecified: Secondary | ICD-10-CM

## 2015-09-01 DIAGNOSIS — K219 Gastro-esophageal reflux disease without esophagitis: Secondary | ICD-10-CM | POA: Diagnosis present

## 2015-09-01 MED ORDER — PIPERACILLIN-TAZOBACTAM 3.375 G IVPB 30 MIN
3.3750 g | Freq: Once | INTRAVENOUS | Status: AC
  Administered 2015-09-02: 3.375 g via INTRAVENOUS
  Filled 2015-09-01: qty 50

## 2015-09-01 NOTE — ED Provider Notes (Signed)
[facility] DEPT Provider Note   CSN: [id number] Arrival date & time: 09/15/2015  2301  First Provider Contact:  First MD Initiated Contact with Patient 09/03/2015 2341        History   Chief Complaint Chief Complaint  Patient presents with  . Other    pulled out G-tube    HPI Kyle Cameron is a 82 y.o. male.  The patient is an 82 year old male, he has a history of Parkinson's disease, also has a history of stroke, recently diagnosed esophageal cancer which has caused some difficulty with swallowing, [place] the beach on [place] 2 weeks ago the patient had been seen at the hospital because of his progressive inability to swallow well and it was decided that a gastrostomy tube was to be placed, this was done percutaneously through endoscopy, he went to rehabilitation for 2 weeks and came home today. While at home the patient's family member was helping him to adjust in the bed and because of the long gastrostomy tube was adjusting the position when it got pulled and dislodged and accidentally removed.      Past Medical History:  Diagnosis Date  . Amaurosis fugax 08/16/2014   Right eye  . Anticoagulated on Coumadin   . Benign enlargement of prostate   . Cataracts, bilateral   . CVA (cerebral infarction)   . GERD (gastroesophageal reflux disease)   . Grover's disease   . Headache   . High cholesterol   . History of diverticulitis of colon   . History of renal calculi   . Hypertension   . Hypothyroidism   . Labyrinthitis   . Parkinson's disease ([place])   . Parkinson's disease ([place])   . Prostate atrophy   . Renal calculi   . Stroke [facility])     Patient Active Problem List   Diagnosis Date Noted  . PEG tube malfunction ([place]) 09/21/2015  . Dysphagia 08/12/2015  . Essential (primary) hypertension 12/27/2014  . Swelling of limb 12/27/2014  . Gastro-esophageal reflux disease without esophagitis 12/27/2014  . 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 Hypothyroidism  12/27/2014  . Mixed hyperlipidemia 12/27/2014  . Parkinson's disease ([place]) 12/27/2014  . Sequelae of cerebral infarction 12/27/2014  . Amaurosis fugax 08/16/2014  . Long term current use of anticoagulant 07/08/2014  . Cerebral infarction due to embolism of left middle cerebral artery ([place]) 06/22/2014  . Discoloration of skin of foot 03/15/2014  . PD (Parkinson's disease) ([place]) 03/15/2014  . Cerebral infarction due to embolism of cerebral artery ([place]) 03/15/2014  . Essential hypertension 03/15/2014  . HLD (hyperlipidemia) 03/15/2014  . Cough   . LV (left ventricular) mural thrombus ([place])   . CVA (cerebral infarction) 01/18/2014  . Imbalance 01/18/2014  . Slurring of speech   . Abnormality of gait 03/12/2012  . Paralysis agitans ([place]) 03/12/2012  . Acute cholecystitis 02/04/2012  . Hypertension 02/04/2012  . Hyperlipidemia 02/04/2012    Past Surgical History:  Procedure Laterality Date  . CATARACT EXTRACTION, BILATERAL    . CHOLECYSTECTOMY  02/04/2012   Procedure: LAPAROSCOPIC CHOLECYSTECTOMY WITH INTRAOPERATIVE CHOLANGIOGRAM;  Surgeon: Merrie Roof, MD;  Location: [facility];  Service: General;  Laterality: N/A;  . LITHOTRIPSY     renal calculi  . TONSILLECTOMY         Home Medications    Prior to Admission medications   Medication Sig Start Date End Date Taking? Authorizing Provider  acetaminophen (TYLENOL) 325 MG tablet Take 650 mg by mouth at bedtime.  Historical Provider, MD  atorvastatin (LIPITOR) 20 MG tablet Take 20 mg by mouth daily at 6 PM.  07/20/14   Historical Provider, MD  carbidopa-levodopa (SINEMET IR) 25-250 MG tablet TAKE 1 TABLET BY MOUTH 3 (THREE) TIMES DAILY. 08/10/15   Kathrynn Ducking, MD  docusate (COLACE) 50 MG/5ML liquid Place into feeding tube daily. Give 10 mL     Historical Provider, MD  doxazosin (CARDURA) 2 MG tablet Take 2 mg by mouth at bedtime.    Historical Provider, MD  lansoprazole (PREVACID) 30 MG capsule Take 30 mg by mouth daily at 12  noon.    Historical Provider, MD  levothyroxine (SYNTHROID, LEVOTHROID) 75 MCG tablet Take 1 tablet (75 mcg total) by mouth daily before breakfast. 08/23/15   Dinah C Ngetich, NP  losartan-hydrochlorothiazide (HYZAAR) 100-25 MG per tablet Take 1 tablet by mouth daily.    Historical Provider, MD  Nutritional Supplements (FEEDING SUPPLEMENT, GLUCERNA 1.5 CAL,) LIQD Place into feeding tube continuous. 240 cc via peg 5Xs per day followed by 175 mLs of water    Historical Provider, MD  pramipexole (MIRAPEX) 0.25 MG tablet Take 1 tablet (0.25 mg total) by mouth 3 (three) times daily. 08/10/15   Kathrynn Ducking, MD  selegiline (ELDEPRYL) 5 MG tablet Take 1 tablet (5 mg total) by mouth 2 (two) times daily with a meal. 08/10/15   Kathrynn Ducking, MD  warfarin (COUMADIN) 6 MG tablet Take 6.5 mg by mouth daily.     Historical Provider, MD    Family History Family History  Problem Relation Age of Onset  . Parkinsonism Mother   . Heart disease Father   . Esophageal cancer Sister     Social History Social History  Substance Use Topics  . Smoking status: Former Research scientist (life sciences)  . Smokeless tobacco: Never Used  . Alcohol use 4.2 oz/week    7 Glasses of wine per week     Comment: Consumes 1 glass of wine daily     Allergies   Ace inhibitors   Review of Systems Review of Systems  All other systems reviewed and are negative.    Physical Exam Updated Vital Signs BP 120/68 (BP Location: Right Arm)   Pulse 94   Temp 98.4 F (36.9 C) (Oral)   Resp 18   SpO2 98%   Physical Exam  Constitutional: He appears well-developed and well-nourished. No distress.  HENT:  Head: Normocephalic and atraumatic.  Mouth/Throat: Oropharynx is clear and moist. No oropharyngeal exudate.  Eyes: Conjunctivae and EOM are normal. Pupils are equal, round, and reactive to light. Right eye exhibits no discharge. Left eye exhibits no discharge. No scleral icterus.  Neck: Normal range of motion. Neck supple. No JVD present. No  thyromegaly present.  Cardiovascular: Normal rate, regular rhythm, normal heart sounds and intact distal pulses.  Exam reveals no gallop and no friction rub.   No murmur heard. Pulmonary/Chest: Effort normal and breath sounds normal. No respiratory distress. He has no wheezes. He has no rales.  Abdominal: Soft. Bowel sounds are normal. He exhibits no distension and no mass. There is no tenderness.  The patient has a very soft abdomen with no tenderness, the PEG tube site appears to be closing down, no bleeding, no drainage, no surrounding redness  Musculoskeletal: Normal range of motion. He exhibits no edema or tenderness.  Lymphadenopathy:    He has no cervical adenopathy.  Neurological: He is alert.  Shuffling gait, very rigid  Skin: Skin is warm and dry. No  rash noted. No erythema.  Psychiatric: He has a normal mood and affect. His behavior is normal.  Nursing note and vitals reviewed.    ED Treatments / Results  Labs (all labs ordered are listed, but only abnormal results are displayed) Labs Reviewed  CBC  BASIC METABOLIC PANEL  PROTIME-INR    I attempted to replace the G-tube with a 24 [place], it did not pass, attempted with 61 [place], unsuccessful, discussed the case with gastroenterology who recommended that they will see the patient in the morning, suggested surgical evaluation as well for surgical tube, discussed with Dr. Donne Hazel of the general surgery service who recommended GI needs to see the patient first and consider strongly replacing the PEG tube before surgical consultation is given.  Pt has possible dehiscence of his stomach from the epidermis as there is no definite tract.  Radiology No results found.  Procedures Procedures (including critical care time)  Medications Ordered in ED Medications  piperacillin-tazobactam (ZOSYN) IVPB 3.375 g (not administered)  losartan-hydrochlorothiazide (HYZAAR) 100-25 MG per tablet 1 tablet (not administered)  atorvastatin  (LIPITOR) tablet 20 mg (not administered)  selegiline (ELDEPRYL) tablet 5 mg (not administered)  pramipexole (MIRAPEX) tablet 0.25 mg (not administered)  acetaminophen (TYLENOL) tablet 650 mg (not administered)  pantoprazole (PROTONIX) EC tablet 40 mg (not administered)  doxazosin (CARDURA) tablet 2 mg (not administered)  levothyroxine (SYNTHROID, LEVOTHROID) tablet 75 mcg (not administered)  carbidopa-levodopa (SINEMET IR) 25-250 MG per tablet immediate release 1 tablet (not administered)  docusate (COLACE) 50 MG/5ML liquid 100 mg (not administered)     Initial Impression / Assessment and Plan / ED Course  I have reviewed the triage vital signs and the nursing notes.  Pertinent labs & imaging results that were available during my care of the patient were reviewed by me and considered in my medical decision making (see chart for details).  Clinical Course  Comment By Time  D/w dr. Alcario Drought will admit Noemi Chapel, MD 08/12 [phone]     Final Clinical Impressions(s) / ED Diagnoses   Final diagnoses:  Gastrostomy complication [facility])    New Prescriptions New Prescriptions   No medications on file     Noemi Chapel, MD 09/07/2015 0013

## 2015-09-01 NOTE — ED Triage Notes (Signed)
Pt pulled out G-tube 20 minutes prior to ED arrival. Pt denies complaints. Pt in no apparent distress

## 2015-09-01 NOTE — ED Notes (Signed)
Dr. Sabra Heck at bedside to put in new G-tube

## 2015-09-02 ENCOUNTER — Observation Stay (HOSPITAL_COMMUNITY): Payer: Medicare Other | Admitting: Anesthesiology

## 2015-09-02 ENCOUNTER — Encounter (HOSPITAL_COMMUNITY): Admission: EM | Disposition: E | Payer: Self-pay | Source: Home / Self Care | Attending: Family Medicine

## 2015-09-02 ENCOUNTER — Observation Stay (HOSPITAL_COMMUNITY): Payer: Medicare Other

## 2015-09-02 ENCOUNTER — Encounter (HOSPITAL_COMMUNITY): Payer: Self-pay | Admitting: Certified Registered"

## 2015-09-02 DIAGNOSIS — G20 Parkinson's disease: Secondary | ICD-10-CM | POA: Diagnosis not present

## 2015-09-02 DIAGNOSIS — Z7901 Long term (current) use of anticoagulants: Secondary | ICD-10-CM | POA: Diagnosis not present

## 2015-09-02 DIAGNOSIS — K9423 Gastrostomy malfunction: Secondary | ICD-10-CM | POA: Diagnosis not present

## 2015-09-02 DIAGNOSIS — J189 Pneumonia, unspecified organism: Secondary | ICD-10-CM

## 2015-09-02 DIAGNOSIS — I10 Essential (primary) hypertension: Secondary | ICD-10-CM

## 2015-09-02 LAB — CBC
HEMATOCRIT: 29 % — AB (ref 39.0–52.0)
HEMOGLOBIN: 8.7 g/dL — AB (ref 13.0–17.0)
MCH: 24.6 pg — AB (ref 26.0–34.0)
MCHC: 30 g/dL (ref 30.0–36.0)
MCV: 82.2 fL (ref 78.0–100.0)
Platelets: 423 10*3/uL — ABNORMAL HIGH (ref 150–400)
RBC: 3.53 MIL/uL — ABNORMAL LOW (ref 4.22–5.81)
RDW: 14.9 % (ref 11.5–15.5)
WBC: 10.2 10*3/uL (ref 4.0–10.5)

## 2015-09-02 LAB — PROTIME-INR
INR: 2.02
Prothrombin Time: 23.2 seconds — ABNORMAL HIGH (ref 11.4–15.2)

## 2015-09-02 LAB — BASIC METABOLIC PANEL
Anion gap: 9 (ref 5–15)
BUN: 42 mg/dL — ABNORMAL HIGH (ref 6–20)
CHLORIDE: 98 mmol/L — AB (ref 101–111)
CO2: 27 mmol/L (ref 22–32)
CREATININE: 1.17 mg/dL (ref 0.61–1.24)
Calcium: 8.5 mg/dL — ABNORMAL LOW (ref 8.9–10.3)
GFR calc non Af Amer: 55 mL/min — ABNORMAL LOW (ref >60)
Glucose, Bld: 108 mg/dL — ABNORMAL HIGH (ref 65–99)
Potassium: 4.1 mmol/L (ref 3.5–5.1)
Sodium: 134 mmol/L — ABNORMAL LOW (ref 135–145)

## 2015-09-02 SURGERY — INVASIVE LAB ABORTED CASE
Anesthesia: Monitor Anesthesia Care

## 2015-09-02 SURGERY — EGD (ESOPHAGOGASTRODUODENOSCOPY)
Anesthesia: Monitor Anesthesia Care

## 2015-09-02 MED ORDER — CETYLPYRIDINIUM CHLORIDE 0.05 % MT LIQD
7.0000 mL | Freq: Two times a day (BID) | OROMUCOSAL | Status: DC
  Administered 2015-09-02 – 2015-09-03 (×3): 7 mL via OROMUCOSAL

## 2015-09-02 MED ORDER — ATORVASTATIN CALCIUM 20 MG PO TABS: 20.0000 mg | ORAL_TABLET | Freq: Every day | ORAL | Status: DC

## 2015-09-02 MED ORDER — HYDROCHLOROTHIAZIDE 25 MG PO TABS
25.0000 mg | ORAL_TABLET | Freq: Every day | ORAL | Status: DC
Start: 2015-09-02 — End: 2015-09-03

## 2015-09-02 MED ORDER — SODIUM CHLORIDE 0.9 % IV SOLN
INTRAVENOUS | Status: DC | PRN
  Administered 2015-09-02: 13:00:00 via INTRAVENOUS

## 2015-09-02 MED ORDER — LEVOTHYROXINE SODIUM 75 MCG PO TABS: 75.0000 ug | ORAL_TABLET | Freq: Every day | ORAL | Status: DC

## 2015-09-02 MED ORDER — DOCUSATE SODIUM 50 MG/5ML PO LIQD: 100.0000 mg | Freq: Every day | ORAL | Status: DC

## 2015-09-02 MED ORDER — PRAMIPEXOLE DIHYDROCHLORIDE 0.25 MG PO TABS
0.2500 mg | ORAL_TABLET | Freq: Three times a day (TID) | ORAL | Status: DC
  Filled 2015-09-02 (×5): qty 1

## 2015-09-02 MED ORDER — CARBIDOPA-LEVODOPA 25-250 MG PO TABS
1.0000 | ORAL_TABLET | Freq: Three times a day (TID) | ORAL | Status: DC
  Filled 2015-09-02 (×5): qty 1

## 2015-09-02 MED ORDER — LOSARTAN POTASSIUM-HCTZ 100-25 MG PO TABS: 1.0000 | ORAL_TABLET | Freq: Every day | ORAL | Status: DC

## 2015-09-02 MED ORDER — SELEGILINE HCL 5 MG PO TABS
5.0000 mg | ORAL_TABLET | Freq: Two times a day (BID) | ORAL | Status: DC
  Filled 2015-09-02 (×4): qty 1

## 2015-09-02 MED ORDER — LIDOCAINE HCL (CARDIAC) 20 MG/ML IV SOLN
INTRAVENOUS | Status: DC | PRN
  Administered 2015-09-02: 60 mg via INTRATRACHEAL

## 2015-09-02 MED ORDER — PROPOFOL 500 MG/50ML IV EMUL
INTRAVENOUS | Status: DC | PRN
  Administered 2015-09-02: 50 ug/kg/min via INTRAVENOUS

## 2015-09-02 MED ORDER — LOSARTAN POTASSIUM 50 MG PO TABS: 100.0000 mg | ORAL_TABLET | Freq: Every day | ORAL | Status: DC

## 2015-09-02 MED ORDER — DEXTROSE 5 % IV SOLN
1.0000 g | INTRAVENOUS | Status: DC
  Administered 2015-09-02: 1 g via INTRAVENOUS
  Filled 2015-09-02 (×3): qty 10

## 2015-09-02 MED ORDER — CHLORHEXIDINE GLUCONATE 0.12 % MT SOLN
15.0000 mL | Freq: Two times a day (BID) | OROMUCOSAL | Status: DC
  Administered 2015-09-02 (×2): 15 mL via OROMUCOSAL
  Filled 2015-09-02 (×2): qty 15

## 2015-09-02 MED ORDER — DOXAZOSIN MESYLATE 2 MG PO TABS
2.0000 mg | ORAL_TABLET | Freq: Every day | ORAL | Status: DC
  Filled 2015-09-02 (×2): qty 1

## 2015-09-02 MED ORDER — SCOPOLAMINE 1 MG/3DAYS TD PT72
1.0000 | MEDICATED_PATCH | TRANSDERMAL | Status: DC
  Administered 2015-09-02: 1.5 mg via TRANSDERMAL
  Filled 2015-09-02: qty 1

## 2015-09-02 MED ORDER — KCL IN DEXTROSE-NACL 20-5-0.45 MEQ/L-%-% IV SOLN
INTRAVENOUS | Status: DC
  Administered 2015-09-02: 15:00:00 via INTRAVENOUS
  Filled 2015-09-02 (×2): qty 1000

## 2015-09-02 MED ORDER — ACETAMINOPHEN 325 MG PO TABS: 650.0000 mg | ORAL_TABLET | Freq: Every day | ORAL | Status: DC

## 2015-09-02 MED ORDER — LORAZEPAM 2 MG/ML IJ SOLN
1.0000 mg | Freq: Three times a day (TID) | INTRAMUSCULAR | Status: DC | PRN
  Administered 2015-09-02 – 2015-09-03 (×2): 1 mg via INTRAVENOUS
  Filled 2015-09-02 (×2): qty 1

## 2015-09-02 MED ORDER — PANTOPRAZOLE SODIUM 40 MG PO TBEC: 40.0000 mg | DELAYED_RELEASE_TABLET | Freq: Every day | ORAL | Status: DC

## 2015-09-02 MED ORDER — ALBUTEROL SULFATE (2.5 MG/3ML) 0.083% IN NEBU
2.5000 mg | INHALATION_SOLUTION | Freq: Four times a day (QID) | RESPIRATORY_TRACT | Status: DC
Start: 2015-09-02 — End: 2015-09-03
  Administered 2015-09-02: 2.5 mg via RESPIRATORY_TRACT
  Filled 2015-09-02: qty 3

## 2015-09-02 MED ORDER — ALBUTEROL SULFATE (2.5 MG/3ML) 0.083% IN NEBU
2.5000 mg | INHALATION_SOLUTION | Freq: Four times a day (QID) | RESPIRATORY_TRACT | Status: DC | PRN
  Administered 2015-09-02: 2.5 mg via RESPIRATORY_TRACT
  Filled 2015-09-02 (×2): qty 3

## 2015-09-02 MED ORDER — FENTANYL CITRATE (PF) 250 MCG/5ML IJ SOLN
INTRAMUSCULAR | Status: AC
Start: 2015-09-02 — End: 2015-09-02
  Filled 2015-09-02: qty 5

## 2015-09-02 NOTE — Progress Notes (Signed)
Aware of request for g-tube replacement.  INR will need to be 1.5 prior to being able to proceed with this procedure.  Will follow  Kyle Cameron E

## 2015-09-02 NOTE — Transfer of Care (Signed)
Immediate Anesthesia Transfer of Care Note  Patient: Kyle Cameron  Procedure(s) Performed: Procedure(s): Aborted Endo Procedure  Patient Location: Endoscopy Unit  Anesthesia Type:MAC  Level of Consciousness: lethargic and responds to stimulation  Airway & Oxygen Therapy: Patient Spontanous Breathing and Patient connected to nasal cannula oxygen  Post-op Assessment: Report given to RN  Post vital signs: Reviewed and stable  Last Vitals:  Vitals:   09/12/2015 1222 09/18/2015 1305  BP: (!) 152/69 128/63  Pulse: 94 94  Resp: 18 17  Temp: 37 C     Last Pain:  Vitals:   09/05/2015 1222  TempSrc: Oral         Complications: No apparent anesthesia complications

## 2015-09-02 NOTE — Progress Notes (Signed)
Attempted PEG replacement. Patient with Proximal esophogeal mass which is friable. I was not able to advance regular adult endoscopy. Pediatric endoscope was used but again, I was feeling resistance and there was small amount of oozing from esophageal mass. Procedure was aborted.  IR notified for possible replacement under radiology guidance.

## 2015-09-02 NOTE — ED Notes (Signed)
Admitting MD at bedside.

## 2015-09-02 NOTE — Consult Note (Signed)
Referring Provider:  Primary Care Physician:  Kandice Hams, MD  Reason for Consultation:  PEG tube placement   HPI: Kyle Cameron is a 82 y.o. male  This is a 82 year old patient with PMH of recent stroke. Recently Diagnosed Esophageal cancer who underwent PEG placement on 08/16/2015 for Dysphagia. Patient accidentally pull out his PEG tube last night. Went to ER. Attempts were made for Balloon PEG replacement without any success. Was admitted to hospital for further management.  Patient's friend (POA for health care decision making) also at bedside. Denied abdominal pain. Denied nausea, vomiting. Denied fever, Denied chest pain or SOB. Scheduled to go to [facility] in two weeks for further management of esophageal cancer . Was on Coumadin which is on hold since last 2-3 days.   Past Medical History:  Diagnosis Date  . Amaurosis fugax 08/16/2014   Right eye  . Anticoagulated on Coumadin   . Benign enlargement of prostate   . Cataracts, bilateral   . CVA (cerebral infarction)   . GERD (gastroesophageal reflux disease)   . Grover's disease   . Headache   . High cholesterol   . History of diverticulitis of colon   . History of renal calculi   . Hypertension   . Hypothyroidism   . Labyrinthitis   . Parkinson's disease ([place])   . Parkinson's disease ([place])   . Prostate atrophy   . Renal calculi   . Stroke [facility])     Past Surgical History:  Procedure Laterality Date  . CATARACT EXTRACTION, BILATERAL    . CHOLECYSTECTOMY  02/04/2012   Procedure: LAPAROSCOPIC CHOLECYSTECTOMY WITH INTRAOPERATIVE CHOLANGIOGRAM;  Surgeon: Merrie Roof, MD;  Location: [facility];  Service: General;  Laterality: N/A;  . LITHOTRIPSY     renal calculi  . TONSILLECTOMY      Prior to Admission medications   Medication Sig Start Date End Date Taking? Authorizing Provider  acetaminophen (TYLENOL) 325 MG tablet Place 650 mg into feeding tube at bedtime.    Yes Historical Provider, MD  atorvastatin (LIPITOR) 20 MG  tablet Place 20 mg into feeding tube daily at 6 PM.  07/20/14  Yes Historical Provider, MD  carbidopa-levodopa (SINEMET IR) 25-250 MG tablet TAKE 1 TABLET BY MOUTH 3 (THREE) TIMES DAILY. Patient taking differently: Place 1 tablet into feeding tube 3 (three) times daily.  08/10/15  Yes Kathrynn Ducking, MD  docusate (COLACE) 50 MG/5ML liquid Place 100 mg into feeding tube daily.    Yes Historical Provider, MD  doxazosin (CARDURA) 2 MG tablet Place 2 mg into feeding tube at bedtime.    Yes Historical Provider, MD  lansoprazole (PREVACID SOLUTAB) 30 MG disintegrating tablet Place 30 mg into feeding tube daily.   Yes Historical Provider, MD  levothyroxine (SYNTHROID, LEVOTHROID) 75 MCG tablet Take 1 tablet (75 mcg total) by mouth daily before breakfast. Patient taking differently: Place 75 mcg into feeding tube daily before breakfast.  08/23/15  Yes Dinah C Ngetich, NP  losartan-hydrochlorothiazide (HYZAAR) 100-25 MG per tablet Place 1 tablet into feeding tube daily.    Yes Historical Provider, MD  Nutritional Supplements (FEEDING SUPPLEMENT, GLUCERNA 1.5 CAL,) LIQD Place 240 mLs into feeding tube 5 (five) times daily. followed by 175 mLs of water   Yes Historical Provider, MD  pramipexole (MIRAPEX) 0.25 MG tablet Take 1 tablet (0.25 mg total) by mouth 3 (three) times daily. Patient taking differently: Place 0.25 mg into feeding tube 3 (three) times daily.  08/10/15  Yes Kathrynn Ducking,  MD  selegiline (ELDEPRYL) 5 MG tablet Take 1 tablet (5 mg total) by mouth 2 (two) times daily with a meal. Patient taking differently: Place 5 mg into feeding tube 2 (two) times daily with a meal.  08/10/15  Yes Kathrynn Ducking, MD  warfarin (COUMADIN) 5 MG tablet Place 5 mg into feeding tube daily.   Yes Historical Provider, MD    Scheduled Meds: . acetaminophen  650 mg Oral QHS  . antiseptic oral rinse  7 mL Mouth Rinse q12n4p  . atorvastatin  20 mg Oral q1800  . carbidopa-levodopa  1 tablet Oral TID  . chlorhexidine   15 mL Mouth Rinse BID  . docusate  100 mg Oral Daily  . doxazosin  2 mg Oral QHS  . losartan  100 mg Oral Daily   And  . hydrochlorothiazide  25 mg Oral Daily  . levothyroxine  75 mcg Oral QAC breakfast  . pantoprazole  40 mg Oral Daily  . pramipexole  0.25 mg Oral TID  . selegiline  5 mg Oral BID WC   Continuous Infusions:  PRN Meds:.  Allergies as of 09/03/2015 - Review Complete 09/14/2015  Allergen Reaction Noted  . Ace inhibitors Other (See Comments) 07/29/2014    Family History  Problem Relation Age of Onset  . Parkinsonism Mother   . Heart disease Father   . Esophageal cancer Sister     Social History   Social History  . Marital status: Single    Spouse name: N/A  . Number of children: 0  . Years of education: college   Occupational History  . Retired    Social History Main Topics  . Smoking status: Former Research scientist (life sciences)  . Smokeless tobacco: Never Used  . Alcohol use 4.2 oz/week    7 Glasses of wine per week     Comment: Consumes 1 glass of wine daily  . Drug use: No  . Sexual activity: Not on file   Other Topics Concern  . Not on file   Social History Narrative   Patient is right handed.   Patient drinks about 1-2 cup caffeine daily.    Review of Systems: All negative except as stated above in HPI.  Physical Exam: Vital signs: Vitals:   08/28/2015 0133 09/19/2015 0518  BP: (!) 134/55 (!) 144/74  Pulse: 88 93  Resp:    Temp: 98.2 F (36.8 C) 98.6 F (37 C)   Last BM Date: 09/03/2015 General:   Alert,  Well-developed, well-nourished, pleasant and cooperative in NAD Lungs:  Clear throughout to auscultation.   No wheezes, crackles, or rhonchi. No acute distress. Heart:  Regular rate and rhythm; no murmurs, clicks, rubs,  or gallops. Abdomen: PEG site looks ok with small amount of blood. Soft, NT, BS + / ND  EXT : no edema   GI:  Lab Results:  Recent Labs  09/14/2015 0019  WBC 10.2  HGB 8.7*  HCT 29.0*  PLT 423*   BMET  Recent Labs   09/18/2015 0019  NA 134*  K 4.1  CL 98*  CO2 27  GLUCOSE 108*  BUN 42*  CREATININE 1.17  CALCIUM 8.5*   LFT No results for input(s): PROT, ALBUMIN, AST, ALT, ALKPHOS, BILITOT, BILIDIR, IBILI in the last 72 hours. PT/INR  Recent Labs  08/22/2015 0019  LABPROT 23.2*  INR 2.02     Studies/Results: No results found.  Impression/Plan: -  Displaced PEG tube .  - Esophageal cancer - recently Diagnosed 08/16/2015  -  Recent stroke causing Oropharyngeal Dysphagia S/P PEG placement 08/16/15. Coimadin on Hold. - Parkinson Disease   PLAN ------ - Case discussed in detail with health care POA. Risk , benefits and alternatives discussed in detail with patient and POA. Verbalized understanding and aggred to proceed.  - Plan for PEG tube replacement today.  - Patient is NPO.  - GI will follow    LOS: 0 days   Ferrah Panagopoulos  09/12/2015, 10:05 AM  Pager [phone] If no answer or after 5 PM call [phone]

## 2015-09-02 NOTE — Anesthesia Postprocedure Evaluation (Signed)
Anesthesia Post Note  Patient: Kyle Cameron  Procedure(s) Performed: Procedure(s): Aborted Endo Procedure  Patient location during evaluation: PACU Anesthesia Type: MAC Level of consciousness: awake and alert Pain management: pain level controlled Vital Signs Assessment: post-procedure vital signs reviewed and stable Respiratory status: spontaneous breathing, nonlabored ventilation, respiratory function stable and patient connected to nasal cannula oxygen Cardiovascular status: stable and blood pressure returned to baseline Anesthetic complications: no    Last Vitals:  Vitals:   08/27/2015 1305 08/30/2015 1315  BP: 128/63 127/62  Pulse: 94 89  Resp: 17 20  Temp:  36.9 C    Last Pain:  Vitals:   08/27/2015 1315  TempSrc: Oral                 Effie Berkshire

## 2015-09-02 NOTE — Anesthesia Procedure Notes (Signed)
Procedure Name: MAC Date/Time: 08/27/2015 12:42 PM Performed by: Barrington Ellison Pre-anesthesia Checklist: Patient identified, Emergency Drugs available, Suction available, Patient being monitored and Timeout performed Patient Re-evaluated:Patient Re-evaluated prior to inductionOxygen Delivery Method: Nasal cannula

## 2015-09-02 NOTE — H&P (Signed)
History and Physical    Kyle Cameron [id number] DOB: November 23, 1926 DOA: 09/20/2015   PCP: Kandice Hams, MD Chief Complaint:  Chief Complaint  Patient presents with  . Other    pulled out G-tube    HPI: Kyle Cameron is a 82 y.o. male with medical history significant of PD, stroke, recent dx of esophageal CA causing difficulty with swallowing.  While at [facility] 2 weeks ago, went to hospital due to progressive inability to swallow.  They decided to place a PEG tube.  Went to rehab for 2 weeks and came home today.  While at home patient's family member helping adjust bed and PEG got accidentally pulled.  ED Course: EDP unable to replace tube.  Given one dose of zosyn but patient asymptomatic at this point.  Review of Systems: As per HPI otherwise 10 point review of systems negative.    Past Medical History:  Diagnosis Date  . Amaurosis fugax 08/16/2014   Right eye  . Anticoagulated on Coumadin   . Benign enlargement of prostate   . Cataracts, bilateral   . CVA (cerebral infarction)   . GERD (gastroesophageal reflux disease)   . Grover's disease   . Headache   . High cholesterol   . History of diverticulitis of colon   . History of renal calculi   . Hypertension   . Hypothyroidism   . Labyrinthitis   . Parkinson's disease ([place])   . Parkinson's disease ([place])   . Prostate atrophy   . Renal calculi   . Stroke [facility])     Past Surgical History:  Procedure Laterality Date  . CATARACT EXTRACTION, BILATERAL    . CHOLECYSTECTOMY  02/04/2012   Procedure: LAPAROSCOPIC CHOLECYSTECTOMY WITH INTRAOPERATIVE CHOLANGIOGRAM;  Surgeon: Merrie Roof, MD;  Location: [place];  Service: General;  Laterality: N/A;  . LITHOTRIPSY     renal calculi  . TONSILLECTOMY       reports that he has quit smoking. He has never used smokeless tobacco. He reports that he drinks about 4.2 oz of alcohol per week . He reports that he does not use drugs.  Allergies  Allergen Reactions  . Ace  Inhibitors Other (See Comments)    Cough    Family History  Problem Relation Age of Onset  . Parkinsonism Mother   . Heart disease Father   . Esophageal cancer Sister       Prior to Admission medications   Medication Sig Start Date End Date Taking? Authorizing Provider  acetaminophen (TYLENOL) 325 MG tablet Take 650 mg by mouth at bedtime.    Historical Provider, MD  atorvastatin (LIPITOR) 20 MG tablet Take 20 mg by mouth daily at 6 PM.  07/20/14   Historical Provider, MD  carbidopa-levodopa (SINEMET IR) 25-250 MG tablet TAKE 1 TABLET BY MOUTH 3 (THREE) TIMES DAILY. 08/10/15   Kathrynn Ducking, MD  docusate (COLACE) 50 MG/5ML liquid Place into feeding tube daily. Give 10 mL     Historical Provider, MD  doxazosin (CARDURA) 2 MG tablet Take 2 mg by mouth at bedtime.    Historical Provider, MD  lansoprazole (PREVACID) 30 MG capsule Take 30 mg by mouth daily at 12 noon.    Historical Provider, MD  levothyroxine (SYNTHROID, LEVOTHROID) 75 MCG tablet Take 1 tablet (75 mcg total) by mouth daily before breakfast. 08/23/15   Dinah C Ngetich, NP  losartan-hydrochlorothiazide (HYZAAR) 100-25 MG per tablet Take 1 tablet by mouth daily.    Historical Provider, MD  Nutritional Supplements (  FEEDING SUPPLEMENT, GLUCERNA 1.5 CAL,) LIQD Place into feeding tube continuous. 240 cc via peg 5Xs per day followed by 175 mLs of water    Historical Provider, MD  pramipexole (MIRAPEX) 0.25 MG tablet Take 1 tablet (0.25 mg total) by mouth 3 (three) times daily. 08/10/15   Kathrynn Ducking, MD  selegiline (ELDEPRYL) 5 MG tablet Take 1 tablet (5 mg total) by mouth 2 (two) times daily with a meal. 08/10/15   Kathrynn Ducking, MD  warfarin (COUMADIN) 6 MG tablet Take 6.5 mg by mouth daily.     Historical Provider, MD    Physical Exam: Vitals:   09/03/2015 2307  BP: 120/68  Pulse: 94  Resp: 18  Temp: 98.4 F (36.9 C)  TempSrc: Oral  SpO2: 98%      Constitutional: NAD, calm, comfortable Eyes: PERRL, lids and  conjunctivae normal ENMT: Mucous membranes are moist. Posterior pharynx clear of any exudate or lesions.Normal dentition.  Neck: normal, supple, no masses, no thyromegaly Respiratory: clear to auscultation bilaterally, no wheezing, no crackles. Normal respiratory effort. No accessory muscle use.  Cardiovascular: Regular rate and rhythm, no murmurs / rubs / gallops. No extremity edema. 2+ pedal pulses. No carotid bruits.  Abdomen: no tenderness, no masses palpated. No hepatosplenomegaly. Bowel sounds positive.  Musculoskeletal: no clubbing / cyanosis. No joint deformity upper and lower extremities. Good ROM, no contractures. Normal muscle tone.  Skin: no rashes, lesions, ulcers. No induration Neurologic: CN 2-12 grossly intact. Sensation intact, DTR normal. Strength 5/5 in all 4. Very soft voice Psychiatric: Normal judgment and insight. Alert and oriented x 3. Normal mood.    Labs on Admission: I have personally reviewed following labs and imaging studies  CBC: No results for input(s): WBC, NEUTROABS, HGB, HCT, MCV, PLT in the last 168 hours. Basic Metabolic Panel: No results for input(s): NA, K, CL, CO2, GLUCOSE, BUN, CREATININE, CALCIUM, MG, PHOS in the last 168 hours. GFR: Estimated Creatinine Clearance: 35.8 mL/min (by C-G formula based on SCr of 1.2 mg/dL). Liver Function Tests: No results for input(s): AST, ALT, ALKPHOS, BILITOT, PROT, ALBUMIN in the last 168 hours. No results for input(s): LIPASE, AMYLASE in the last 168 hours. No results for input(s): AMMONIA in the last 168 hours. Coagulation Profile: No results for input(s): INR, PROTIME in the last 168 hours. Cardiac Enzymes: No results for input(s): CKTOTAL, CKMB, CKMBINDEX, TROPONINI in the last 168 hours. BNP (last 3 results) No results for input(s): PROBNP in the last 8760 hours. HbA1C: No results for input(s): HGBA1C in the last 72 hours. CBG: No results for input(s): GLUCAP in the last 168 hours. Lipid Profile: No  results for input(s): CHOL, HDL, LDLCALC, TRIG, CHOLHDL, LDLDIRECT in the last 72 hours. Thyroid Function Tests: No results for input(s): TSH, T4TOTAL, FREET4, T3FREE, THYROIDAB in the last 72 hours. Anemia Panel: No results for input(s): VITAMINB12, FOLATE, FERRITIN, TIBC, IRON, RETICCTPCT in the last 72 hours. Urine analysis:    Component Value Date/Time   COLORURINE YELLOW 01/18/2014 [place] 01/18/2014 0938   LABSPEC 1.020 01/18/2014 0938   PHURINE 5.5 01/18/2014 0938   GLUCOSEU NEGATIVE 01/18/2014 0938   HGBUR NEGATIVE 01/18/2014 0938   BILIRUBINUR NEGATIVE 01/18/2014 0938   KETONESUR NEGATIVE 01/18/2014 0938   PROTEINUR NEGATIVE 01/18/2014 0938   UROBILINOGEN 0.2 01/18/2014 0938   NITRITE NEGATIVE 01/18/2014 0938   LEUKOCYTESUR NEGATIVE 01/18/2014 0938   Sepsis Labs: @LABRCNTIP (procalcitonin:4,lacticidven:4) )No results found for this or any previous visit (from the past 240 hour(s)).  Radiological Exams on Admission: No results found.  EKG: Independently reviewed.  Assessment/Plan Principal Problem:   PEG tube malfunction (HCC) Active Problems:   PD (Parkinson's disease) ([place])   Essential hypertension   Long term current use of anticoagulant   PEG tube got dislodged -  Admitting overnight for obs  [facility] to see in AM  NPO except sips with meds for now  Routine lab work still pending at time of admission  HTN - continue home meds  PD - continue sinemet  Long term anticoagulation - Holding coumadin for the moment, INR pending   DVT prophylaxis: SCDs only Code Status: Full Family Communication: Family at bedside Consults called: [facility] called by EDP they will see in AM Admission status: Admit to obs   [place], [place] [facility] Pager [phone] from 7PM-7AM  If 7AM-7PM, please contact the day physician for the patient [email or web address] Password [email or web address]  09/19/2015, 12:21 AM

## 2015-09-02 NOTE — Progress Notes (Addendum)
1325 received pt from Endo via bed, pt is a little sleepy, responsive. Dr Alessandra Bevels unable to place peg tube. Consulted IR. Pt's son updated. Pt with productive cough, able to use yanker suction. Dr Aggie Moats notified, also message sent that oral meds was held due to pt unable to swallow. No peg placed yet.

## 2015-09-02 NOTE — Anesthesia Preprocedure Evaluation (Addendum)
Anesthesia Evaluation  Patient identified by MRN, date of birth, ID band Patient awake    Reviewed: Allergy & Precautions, NPO status , Patient's Chart, lab work & pertinent test results  Airway Mallampati: III  TM Distance: >3 FB Neck ROM: Full    Dental  (+) Teeth Intact, Chipped, Dental Advisory Given,    Pulmonary former smoker,    breath sounds clear to auscultation       Cardiovascular hypertension,  Rhythm:Regular Rate:Normal     Neuro/Psych  Headaches, CVA, Residual Symptoms negative psych ROS   GI/Hepatic Neg liver ROS, GERD  Medicated,  Endo/Other  Hypothyroidism   Renal/GU Renal disease  negative genitourinary   Musculoskeletal negative musculoskeletal ROS (+)   Abdominal   Peds negative pediatric ROS (+)  Hematology negative hematology ROS (+)   Anesthesia Other Findings   Reproductive/Obstetrics negative OB ROS                           Lab Results  Component Value Date   WBC 10.2 09/01/2015   HGB 8.7 (L) 08/24/2015   HCT 29.0 (L) 09/16/2015   MCV 82.2 09/08/2015   PLT 423 (H) 09/20/2015   Lab Results  Component Value Date   CREATININE 1.17 09/08/2015   BUN 42 (H) 08/29/2015   NA 134 (L) 08/29/2015   K 4.1 09/09/2015   CL 98 (L) 09/11/2015   CO2 27 09/20/2015   Lab Results  Component Value Date   INR 2.02 09/01/2015   INR 1.3 (A) 08/18/2015   INR 1.52 (H) 01/19/2014   07/2014 EKG: normal sinus rhythm, 1st degree AV block.    Anesthesia Physical Anesthesia Plan  ASA: III  Anesthesia Plan: MAC   Post-op Pain Management:    Induction: Intravenous  Airway Management Planned: Natural Airway  Additional Equipment:   Intra-op Plan:   Post-operative Plan:   Informed Consent: I have reviewed the patients History and Physical, chart, labs and discussed the procedure including the risks, benefits and alternatives for the proposed anesthesia with the  patient or authorized representative who has indicated his/her understanding and acceptance.     Plan Discussed with: CRNA  Anesthesia Plan Comments:         Anesthesia Quick Evaluation

## 2015-09-02 NOTE — Progress Notes (Signed)
Patient arrived to floor via stretcher from ED, son at bedside. V/S stable. Patient was orientated to room, call light within reach.  Per patient and his son pt. Does not  Swallow anything by mouth at all. No med's no sips. Everything was through Peg tube.  Will continue to monitor. Jimmie Molly, RN

## 2015-09-02 NOTE — Progress Notes (Signed)
[facility] Update Note  Patient states he's had productive cough for the last 2 days. Has been coughing up some blood. Denies any fevers. Denies any chest pain. States once his PEG tube replaced so he can go home.  Assessment/Plan Principal Problem:   PEG tube malfunction (HCC) Active Problems:   PD (Parkinson's disease) ([place])   Essential hypertension   Long term current use of anticoagulant   PEG tube got dislodged -                       Admitting overnight for obs                       [facility] following and rectal examinations appreciated                       NPO except sips with meds for now   Maintenance IV fluids   IR consulted for placement INR to elevated, currently holding warfarin, will   discussed vitamin K patient                       Routine lab work still pending at time of admission  CAP/aspiration pneumonitis Ordered a chest x-ray that showed likely bilateral pneumonia Sputum culture ordered Started on Rocephin Scheduled albuterol treatments When necessary albuterol for shortness of breath  HTN - continue home meds when no longer nothing by mouth -When necessary Lopressor IV  PD - continue sinemet when no longer nothing by mouth  Long term anticoagulation - Holding coumadin for the moment, INR 2   DVT prophylaxis: SCDs only Code Status: Full Family Communication: pt asked me not to contact Consults called: [facility], IR Admission status: Admit to obs   Elwin Mocha MD [facility] Pager Amion from 7PM-7AM  If 7AM-7PM, please contact the day physician for the patient [email or web address] Password [email or web address]

## 2015-09-03 ENCOUNTER — Observation Stay (HOSPITAL_COMMUNITY): Payer: Medicare Other

## 2015-09-03 ENCOUNTER — Encounter (HOSPITAL_COMMUNITY): Payer: Self-pay | Admitting: General Surgery

## 2015-09-03 DIAGNOSIS — I10 Essential (primary) hypertension: Secondary | ICD-10-CM | POA: Diagnosis not present

## 2015-09-03 DIAGNOSIS — R1310 Dysphagia, unspecified: Secondary | ICD-10-CM | POA: Diagnosis not present

## 2015-09-03 DIAGNOSIS — Z66 Do not resuscitate: Secondary | ICD-10-CM | POA: Diagnosis present

## 2015-09-03 DIAGNOSIS — C153 Malignant neoplasm of upper third of esophagus: Secondary | ICD-10-CM | POA: Diagnosis not present

## 2015-09-03 DIAGNOSIS — R0602 Shortness of breath: Secondary | ICD-10-CM | POA: Diagnosis not present

## 2015-09-03 DIAGNOSIS — Z5309 Procedure and treatment not carried out because of other contraindication: Secondary | ICD-10-CM | POA: Diagnosis present

## 2015-09-03 DIAGNOSIS — Z87891 Personal history of nicotine dependence: Secondary | ICD-10-CM | POA: Diagnosis not present

## 2015-09-03 DIAGNOSIS — C7800 Secondary malignant neoplasm of unspecified lung: Secondary | ICD-10-CM | POA: Diagnosis not present

## 2015-09-03 DIAGNOSIS — J189 Pneumonia, unspecified organism: Secondary | ICD-10-CM | POA: Diagnosis not present

## 2015-09-03 DIAGNOSIS — G20 Parkinson's disease: Secondary | ICD-10-CM | POA: Diagnosis not present

## 2015-09-03 DIAGNOSIS — E782 Mixed hyperlipidemia: Secondary | ICD-10-CM | POA: Diagnosis present

## 2015-09-03 DIAGNOSIS — Z515 Encounter for palliative care: Secondary | ICD-10-CM | POA: Diagnosis not present

## 2015-09-03 DIAGNOSIS — Z431 Encounter for attention to gastrostomy: Secondary | ICD-10-CM | POA: Diagnosis not present

## 2015-09-03 DIAGNOSIS — I252 Old myocardial infarction: Secondary | ICD-10-CM | POA: Diagnosis not present

## 2015-09-03 DIAGNOSIS — K9423 Gastrostomy malfunction: Secondary | ICD-10-CM | POA: Diagnosis not present

## 2015-09-03 DIAGNOSIS — Z79899 Other long term (current) drug therapy: Secondary | ICD-10-CM | POA: Diagnosis not present

## 2015-09-03 DIAGNOSIS — I69391 Dysphagia following cerebral infarction: Secondary | ICD-10-CM | POA: Diagnosis not present

## 2015-09-03 DIAGNOSIS — K219 Gastro-esophageal reflux disease without esophagitis: Secondary | ICD-10-CM | POA: Diagnosis not present

## 2015-09-03 DIAGNOSIS — E039 Hypothyroidism, unspecified: Secondary | ICD-10-CM | POA: Diagnosis present

## 2015-09-03 DIAGNOSIS — Z7189 Other specified counseling: Secondary | ICD-10-CM

## 2015-09-03 DIAGNOSIS — R1312 Dysphagia, oropharyngeal phase: Secondary | ICD-10-CM | POA: Diagnosis not present

## 2015-09-03 LAB — CBC WITH DIFFERENTIAL/PLATELET
BASOS PCT: 0 %
Basophils Absolute: 0 10*3/uL (ref 0.0–0.1)
EOS ABS: 0.1 10*3/uL (ref 0.0–0.7)
EOS PCT: 1 %
HCT: 29.5 % — ABNORMAL LOW (ref 39.0–52.0)
HEMOGLOBIN: 8.9 g/dL — AB (ref 13.0–17.0)
LYMPHS ABS: 1.2 10*3/uL (ref 0.7–4.0)
Lymphocytes Relative: 13 %
MCH: 24.8 pg — AB (ref 26.0–34.0)
MCHC: 30.2 g/dL (ref 30.0–36.0)
MCV: 82.2 fL (ref 78.0–100.0)
MONO ABS: 0.8 10*3/uL (ref 0.1–1.0)
MONOS PCT: 9 %
Neutro Abs: 7.3 10*3/uL (ref 1.7–7.7)
Neutrophils Relative %: 77 %
PLATELETS: 451 10*3/uL — AB (ref 150–400)
RBC: 3.59 MIL/uL — ABNORMAL LOW (ref 4.22–5.81)
RDW: 14.7 % (ref 11.5–15.5)
WBC: 9.5 10*3/uL (ref 4.0–10.5)

## 2015-09-03 LAB — BASIC METABOLIC PANEL
Anion gap: 11 (ref 5–15)
BUN: 27 mg/dL — AB (ref 6–20)
CHLORIDE: 99 mmol/L — AB (ref 101–111)
CO2: 23 mmol/L (ref 22–32)
Calcium: 8.2 mg/dL — ABNORMAL LOW (ref 8.9–10.3)
Creatinine, Ser: 1.16 mg/dL (ref 0.61–1.24)
GFR calc Af Amer: >60 mL/min (ref >60)
GFR, EST NON AFRICAN AMERICAN: 55 mL/min — AB (ref >60)
Glucose, Bld: 130 mg/dL — ABNORMAL HIGH (ref 65–99)
Potassium: 4.1 mmol/L (ref 3.5–5.1)
SODIUM: 133 mmol/L — AB (ref 135–145)

## 2015-09-03 LAB — PROTIME-INR
INR: 2.3
PROTHROMBIN TIME: 25.7 s — AB (ref 11.4–15.2)

## 2015-09-03 LAB — MRSA PCR SCREENING: MRSA by PCR: NEGATIVE

## 2015-09-03 LAB — GLUCOSE, CAPILLARY: Glucose-Capillary: 113 mg/dL — ABNORMAL HIGH (ref 65–99)

## 2015-09-03 MED ORDER — VITAMIN K1 10 MG/ML IJ SOLN
10.0000 mg | Freq: Once | INTRAVENOUS | Status: AC
  Administered 2015-09-03: 10 mg via INTRAVENOUS
  Filled 2015-09-03: qty 1

## 2015-09-03 MED ORDER — ONDANSETRON HCL 4 MG/2ML IJ SOLN: 4.0000 mg | Freq: Four times a day (QID) | INTRAMUSCULAR | Status: DC | PRN

## 2015-09-03 MED ORDER — ALBUTEROL SULFATE (2.5 MG/3ML) 0.083% IN NEBU: 2.5000 mg | INHALATION_SOLUTION | RESPIRATORY_TRACT | Status: DC | PRN

## 2015-09-03 MED ORDER — ONDANSETRON 4 MG PO TBDP
4.0000 mg | ORAL_TABLET | Freq: Four times a day (QID) | ORAL | Status: DC | PRN
  Filled 2015-09-03: qty 1

## 2015-09-03 MED ORDER — SODIUM CHLORIDE 0.9 % IV SOLN
1.0000 mg/h | INTRAVENOUS | Status: DC
  Administered 2015-09-03: 0.3 mg/h via INTRAVENOUS
  Filled 2015-09-03 (×2): qty 2.5

## 2015-09-03 MED ORDER — IOPAMIDOL (ISOVUE-300) INJECTION 61%
INTRAVENOUS | Status: AC
  Administered 2015-09-03: 75 mL
  Filled 2015-09-03: qty 75

## 2015-09-03 MED ORDER — ACETAMINOPHEN 325 MG PO TABS: 650.0000 mg | ORAL_TABLET | Freq: Four times a day (QID) | ORAL | Status: DC | PRN

## 2015-09-03 MED ORDER — MORPHINE SULFATE (CONCENTRATE) 10 MG/0.5ML PO SOLN: 5.0000 mg | ORAL | Status: DC | PRN

## 2015-09-03 MED ORDER — VITAMIN K1 10 MG/ML IJ SOLN
5.0000 mg | Freq: Once | INTRAMUSCULAR | Status: DC
  Filled 2015-09-03: qty 0.5

## 2015-09-03 MED ORDER — FUROSEMIDE 10 MG/ML IJ SOLN
40.0000 mg | Freq: Once | INTRAMUSCULAR | Status: AC
  Administered 2015-09-03: 40 mg via INTRAVENOUS
  Filled 2015-09-03: qty 4

## 2015-09-03 MED ORDER — FENTANYL CITRATE (PF) 100 MCG/2ML IJ SOLN
25.0000 ug | INTRAMUSCULAR | Status: DC | PRN
  Administered 2015-09-03: 25 ug via INTRAVENOUS
  Filled 2015-09-03: qty 2

## 2015-09-03 MED ORDER — LORAZEPAM 2 MG/ML IJ SOLN
1.0000 mg | INTRAMUSCULAR | Status: DC | PRN
  Administered 2015-09-04 (×3): 1 mg via INTRAVENOUS
  Filled 2015-09-03 (×3): qty 1

## 2015-09-03 MED ORDER — LORAZEPAM 2 MG/ML PO CONC: 1.0000 mg | ORAL | Status: DC | PRN

## 2015-09-03 MED ORDER — ACETAMINOPHEN 650 MG RE SUPP
650.0000 mg | Freq: Four times a day (QID) | RECTAL | Status: DC | PRN
Start: 2015-09-03 — End: 2015-09-04

## 2015-09-03 MED ORDER — LORAZEPAM 1 MG PO TABS: 1.0000 mg | ORAL_TABLET | ORAL | Status: DC | PRN

## 2015-09-03 MED ORDER — BISACODYL 10 MG RE SUPP: 10.0000 mg | Freq: Every day | RECTAL | Status: DC | PRN

## 2015-09-03 MED ORDER — HYDROMORPHONE BOLUS VIA INFUSION
0.5000 mg | INTRAVENOUS | Status: DC | PRN
  Administered 2015-09-03 – 2015-09-04 (×3): 0.5 mg via INTRAVENOUS
  Filled 2015-09-03: qty 1

## 2015-09-03 NOTE — Consult Note (Signed)
Consultation Note Date: 09/03/2015   Patient Name: Kyle Cameron  DOB: Jun 02, 1926  MRN: [id number]  Age / Sex: 82 y.o., male  PCP: Seward Carol, MD Referring Physician: Elwin Mocha, MD  Reason for Consultation: Terminal Care  HPI/Patient Profile: 82 y.o. male   admitted on 08/30/2015    Clinical Assessment and Goals of Care:  82 year old male past mental history significant for esophageal cancer presented to the emergency room after PEG tube dislodgment. Patient was originally admitted for observation to have PEG tube placement by GI. Patient was seen by GI who recommended PEG tube placement by interventional radiology. On day 1 in the hospital patient was found to be short of breath and chest x-ray showing bilateral pneumonia. He also had significant oral secretions. He was started on Rocephin, regimen of breathing treatments and given a scopolamine patch. The following morning he was altered. Scopolamine patch was removed. Normal blood glucose. No hypoxia. In moderate respiratory distress on exam. Chest x-ray showed worsening of his bilateral pneumonia. Chest x-ray also showed possible impingement on the trachea. Patient had chest CT done that showed erosion of necrotic cancer into a primary bronchi. Patient also on CT had obstruction of the proximal esophagus. Patient worsened clinically throughout the day. Discussed this with the power of attorney and family. Patient is now comfort care. Palliative care has been consulted for additional terminal care.   I introduced myself and palliative care as follows: Palliative medicine is specialized medical care for people living with serious illness. It focuses on providing relief from the symptoms and stress of a serious illness. The goal is to improve quality of life for both the patient and the family.  Patient is awake but not alert. He is restless. He is  confused. Wilburn Mylar and partner is present at the bedside. Discussed about comfort measures and appropriate pain management. We will start Dilaudid drip and monitor. End of life signs and symptoms discussed with [name].      Wilburn Mylar at [address]    DNR DNI Comfort care only Start  Dilaudid infusion Agree with other comfort measures already in place Anticipate hospital death  Code Status/Advance Care Planning:  DNR    Symptom Management:     See above  Palliative Prophylaxis:   Delirium Protocol  Additional Recommendations (Limitations, Scope, Preferences):  Full Comfort Care  Psycho-social/Spiritual:   Desire for further Chaplaincy support:yes  Additional Recommendations: Education on Hospice  Prognosis:   Hours - Days  Discharge Planning: Anticipated Hospital Death      Primary Diagnoses: Present on Admission: . PEG tube malfunction ([place]) . Essential hypertension . PD (Parkinson's disease) ([place])   I have reviewed the medical record, interviewed the patient and family, and examined the patient. The following aspects are pertinent.  Past Medical History:  Diagnosis Date  . Amaurosis fugax 08/16/2014   Right eye  . Anticoagulated on Coumadin   . Benign enlargement of prostate   . Cataracts, bilateral   .  CVA (cerebral infarction)   . GERD (gastroesophageal reflux disease)   . Grover's disease   . Headache   . High cholesterol   . History of diverticulitis of colon   . History of renal calculi   . Hypertension   . Hypothyroidism   . Labyrinthitis   . Parkinson's disease ([place])   . Parkinson's disease ([place])   . Prostate atrophy   . Renal calculi   . Stroke [facility])    Social History   Social History  . Marital status: Single    Spouse name: N/A  . Number of children: 0  . Years of education: college   Occupational History  . Retired    Social History Main Topics  . Smoking status: Former Research scientist (life sciences)  .  Smokeless tobacco: Never Used  . Alcohol use 4.2 oz/week    7 Glasses of wine per week     Comment: Consumes 1 glass of wine daily  . Drug use: No  . Sexual activity: Not Asked   Other Topics Concern  . None   Social History Narrative   Patient is right handed.   Patient drinks about 1-2 cup caffeine daily.   Family History  Problem Relation Age of Onset  . Parkinsonism Mother   . Heart disease Father   . Esophageal cancer Sister    Scheduled Meds:  Continuous Infusions: . HYDROmorphone     PRN Meds:.acetaminophen **OR** acetaminophen, albuterol, bisacodyl, HYDROmorphone, LORazepam **OR** LORazepam **OR** LORazepam, ondansetron **OR** ondansetron (ZOFRAN) IV Medications Prior to Admission:  Prior to Admission medications   Medication Sig Start Date End Date Taking? Authorizing Provider  acetaminophen (TYLENOL) 325 MG tablet Place 650 mg into feeding tube at bedtime.    Yes Historical Provider, MD  atorvastatin (LIPITOR) 20 MG tablet Place 20 mg into feeding tube daily at 6 PM.  07/20/14  Yes Historical Provider, MD  carbidopa-levodopa (SINEMET IR) 25-250 MG tablet TAKE 1 TABLET BY MOUTH 3 (THREE) TIMES DAILY. Patient taking differently: Place 1 tablet into feeding tube 3 (three) times daily.  08/10/15  Yes Kathrynn Ducking, MD  docusate (COLACE) 50 MG/5ML liquid Place 100 mg into feeding tube daily.    Yes Historical Provider, MD  doxazosin (CARDURA) 2 MG tablet Place 2 mg into feeding tube at bedtime.    Yes Historical Provider, MD  lansoprazole (PREVACID SOLUTAB) 30 MG disintegrating tablet Place 30 mg into feeding tube daily.   Yes Historical Provider, MD  levothyroxine (SYNTHROID, LEVOTHROID) 75 MCG tablet Take 1 tablet (75 mcg total) by mouth daily before breakfast. Patient taking differently: Place 75 mcg into feeding tube daily before breakfast.  08/23/15  Yes Dinah C Ngetich, NP  losartan-hydrochlorothiazide (HYZAAR) 100-25 MG per tablet Place 1 tablet into feeding tube  daily.    Yes Historical Provider, MD  Nutritional Supplements (FEEDING SUPPLEMENT, GLUCERNA 1.5 CAL,) LIQD Place 240 mLs into feeding tube 5 (five) times daily. followed by 175 mLs of water   Yes Historical Provider, MD  pramipexole (MIRAPEX) 0.25 MG tablet Take 1 tablet (0.25 mg total) by mouth 3 (three) times daily. Patient taking differently: Place 0.25 mg into feeding tube 3 (three) times daily.  08/10/15  Yes Kathrynn Ducking, MD  selegiline (ELDEPRYL) 5 MG tablet Take 1 tablet (5 mg total) by mouth 2 (two) times daily with a meal. Patient taking differently: Place 5 mg into feeding tube 2 (two) times daily with a meal.  08/10/15  Yes Kathrynn Ducking, MD  warfarin (COUMADIN)  5 MG tablet Place 5 mg into feeding tube daily.   Yes Historical Provider, MD   Allergies  Allergen Reactions  . Ace Inhibitors Other (See Comments)    Cough   Review of Systems + for generalized pain, restlessness  Physical Exam Elderly gentleman  Restless Awake  In soft restraints S1 S2 Shallow breathing Abdomen soft distended No edema Awake not alert  Vital Signs: BP (!) 151/80 (BP Location: Left Arm)   Pulse (!) 124   Temp (!) 101 F (38.3 C) (Rectal)   Resp (!) 28   SpO2 92%  Pain Assessment: No/denies pain       SpO2: SpO2: 92 % O2 Device:SpO2: 92 % O2 Flow Rate: .O2 Flow Rate (L/min): 10 L/min  IO: Intake/output summary:  Intake/Output Summary (Last 24 hours) at 09/03/15 1657 Last data filed at 09/03/15 0543  Gross per 24 hour  Intake             1210 ml  Output              225 ml  Net              985 ml    LBM: Last BM Date: 09/20/2015 Baseline Weight:   Most recent weight:       Palliative Assessment/Data:   Flowsheet Rows   Flowsheet Row Most Recent Value  Intake Tab  Referral Department  Hospitalist  Unit at Time of Referral  Intermediate Care Unit  Palliative Care Primary Diagnosis  Cancer  Palliative Care Type  New Palliative care  Reason for referral  End of Life  Care Assistance  Date first seen by Palliative Care  09/03/15  Clinical Assessment  Palliative Performance Scale Score  20%  Pain Max last 24 hours  6  Pain Min Last 24 hours  5  Dyspnea Max Last 24 Hours  4  Dyspnea Min Last 24 hours  3  Nausea Max Last 24 Hours  6  Nausea Min Last 24 Hours  5  Anxiety Max Last 24 Hours  8  Anxiety Min Last 24 Hours  7  Psychosocial & Spiritual Assessment  Palliative Care Outcomes  Patient/Family meeting held?  Yes  Who was at the meeting?  partner [place] goals of care      Time In: 1550 Time Out: 1650 Time Total: 60 Greater than 50%  of this time was spent counseling and coordinating care related to the above assessment and plan.  Signed by: Loistine Chance, MD  [phone]   Please contact Palliative Medicine Team phone at [phone] for questions and concerns.  For individual provider: See Amion password [facility]

## 2015-09-03 NOTE — Progress Notes (Signed)
[facility] PROGRESS NOTE  Kyle Cameron [id number] DOB: 22-Aug-1926 DOA: 08/28/2015 PCP: Kandice Hams, MD  Summary: 82 year old male past mental history significant for esophageal cancer presented to the emergency room after PEG tube dislodgment. Patient was originally admitted for observation to have PEG tube placement by GI. Patient was seen by GI who recommended PEG tube placement by interventional radiology. On day 1 in the hospital patient was found to be short of breath and chest x-ray showing bilateral pneumonia. He also had significant oral secretions. He was started on Rocephin, regimen of breathing treatments and given a scopolamine patch. The following morning he was altered. Scopolamine patch was removed. Normal blood glucose. No hypoxia. In moderate respiratory distress on exam. Chest x-ray showed worsening of his bilateral pneumonia. Chest x-ray also showed possible impingement on the trachea. Patient had chest CT done that showed erosion of necrotic cancer into a primary bronchi. Patient also on CT had obstruction of the proximal esophagus. Patient worsened clinically throughout the day. Discussed this with the power of attorney and family. Patient is now comfort care. Palliative care has been consulted.  Assessment/Plan:  Erosion of cancer to the primary bronchi/Sepsis 2/2 bilateral pneumonia/proximal obstruction of the esophagus - Palliative care consulted - Above findings were found on chest CT - Discussed options of pursuing possible stenting of bronchi with family and power of attorney. They decided to make patient comfort care instead. - sitter ordered & restraints  PEG tube got dislodged - notified gastroneurology the patient is now comfort care  HTN - stopped all meds  PD - stopped all meds  Long term anticoagulation -  stopping all medication   DVT prophylaxis: SCDs only Code Status: Full Family Communication: pt asked me not to contact Consults  called: [facility], IR Admission status: Admit to obs   Consultants:  Palliative  Procedures:  none  Antibiotics:  Rocephin 8/12-->8/13t (indicate start date, and stop date if known)  HPI/Subjective: Per nursing patient altered and agitated.  Objective: Vitals:   09/03/15 1313 09/03/15 1429  BP: (!) 147/78 (!) 151/80  Pulse: 95 (!) 124  Resp: 20 (!) 28  Temp: 98.8 F (37.1 C) (!) 101 F (38.3 C)    Intake/Output Summary (Last 24 hours) at 09/03/15 1553 Last data filed at 09/03/15 0543  Gross per 24 hour  Intake             1210 ml  Output              225 ml  Net              985 ml   There were no vitals filed for this visit.  Exam:  General:  Acute respiratory distress, nonverbal, GCS 11 Cardiovascular: Tachycardic regular rhythm Respiratory: Coarse breath sounds, tachypnea, hemoptysis Abdomen: Nondistended bowel sounds normal nontender palpation Musculoskeletal: Moving all extremities, no deformity, 5 out of 5 strength   Data Reviewed: Basic Metabolic Panel:  Recent Labs Lab 09/19/2015 0019 09/03/15 0252  NA 134* 133*  K 4.1 4.1  CL 98* 99*  CO2 27 23  GLUCOSE 108* 130*  BUN 42* 27*  CREATININE 1.17 1.16  CALCIUM 8.5* 8.2*   Liver Function Tests: No results for input(s): AST, ALT, ALKPHOS, BILITOT, PROT, ALBUMIN in the last 168 hours. No results for input(s): LIPASE, AMYLASE in the last 168 hours. No results for input(s): AMMONIA in the last 168 hours. CBC:  Recent Labs Lab 09/07/2015 0019 09/03/15 0252  WBC 10.2 9.5  NEUTROABS  --  7.3  HGB 8.7* 8.9*  HCT 29.0* 29.5*  MCV 82.2 82.2  PLT 423* 451*   Cardiac Enzymes: No results for input(s): CKTOTAL, CKMB, CKMBINDEX, TROPONINI in the last 168 hours. BNP (last 3 results) No results for input(s): BNP in the last 8760 hours.  ProBNP (last 3 results) No results for input(s): PROBNP in the last 8760 hours.  CBG:  Recent Labs Lab 09/03/15 0858  GLUCAP 113*    No results found for  this or any previous visit (from the past 240 hour(s)).   Studies: Ct Chest W Contrast  Result Date: 09/03/2015 CLINICAL DATA:  82 year old male with productive cough for 2 days. Shortness of breath. Esophageal cancer. Initial encounter. EXAM: CT CHEST WITH CONTRAST TECHNIQUE: Multidetector CT imaging of the chest was performed during intravenous contrast administration. CONTRAST:  5mL ISOVUE-300 IOPAMIDOL (ISOVUE-300) INJECTION 61% COMPARISON:  Chest radiographs 1041 hours today and earlier. CT Abdomen and Pelvis 02/03/2012. Chest CTA 09/11/2009. FINDINGS: Massive mediastinal tumor infiltrating outwardly from the esophagus in the posterior mediastinum encompassing 7.1 by 8.2 by 16.1 cm (AP by transverse by CC). Associated regional mass effect. The tumor appears centrally necrotic in the region of the esophageal lumen at the carina, with gas bubbles located centrally. The more proximal esophagus at the thoracic inlet is markedly dilated up to 4.4 cm diameter. Tumor has engulfed bronchial artery branches arising from the descending thoracic aorta. Tumor has eroded into the mainstem bronchi. There are retained secretions in the right mainstem bronchus. Tumor has infiltrated into the AP window and toward both hila. Superimposed malignant anterior carina and other mediastinal lymph nodes up to 16 mm individually. The distal most thoracic esophagus and gastroesophageal junction are spared. No pericardial effusion. Moderate to large layering left and moderate layering right pleural effusions. Abnormal peribronchial ground-glass and more confluent pulmonary opacity in all lobes. Superimposed septal thickening. Calcified aortic atherosclerosis. Surgically absent gallbladder. Stable visible liver, spleen, pancreas, and visualized intestines in the upper abdomen since 2014. There is new bilateral hydronephrosis, moderate to severe. The entire kidneys are not included. Obstructing etiology is not identified. L1 inferior  endplate compression fracture with mildly displaced endplate fragments. This appears somewhat subacute, with some periosteal new bone formation. However, there is motion artifact at this level of the scan. Despite the extensive posterior mediastinal tumor, no infiltration of the thoracic spine is identified. Other visualized osseous structures appear stable. IMPRESSION: 1. Massive centrally necrotic mediastinal tumor infiltrating outwardly from the esophagus. Tumor encompasses 7.1 x 8.2 x 16.1 cm (AP by transverse by CC). Erosion into the mainstem bronchi suspected. Obstruction of the proximal esophagus which is dilated up to 4 cm. Anterior carina malignant lymphadenopathy. 2. Superimposed bilateral multilobar bronchopneumonia with moderate to large layering pleural effusions greater on the left. 3. Subacute appearing L1 inferior endplate compression fracture. Burtis Junes this is a benign fracture as no other osseous metastatic disease is identified. Electronically Signed   By: Genevie Ann M.D.   On: 09/03/2015 14:44   Dg Chest Port 1 View  Result Date: 09/03/2015 CLINICAL DATA:  Shortness of breath, productive cough for 2 days, congestion, history stroke, hypertension, former smoker, Parkinson's EXAM: PORTABLE CHEST 1 VIEW COMPARISON:  Portable exam 1041 hours compared to 08/30/2015 and 01/18/2014. FINDINGS: Upper normal heart size. Atherosclerotic calcification. Significant mass effect upon the LEFT lateral aspect of the trachea suggesting presence of mediastinal mass or adenopathy, increased in prominence since 2015. Slight prominence of RIGHT paratracheal soft tissues. Mild LEFT hilar fullness, more conspicuous than on  the previous study due to minimal rotation to the RIGHT. Infiltrates in LEFT lower lobe persist, with improved aeration in RIGHT lower lobe since previous exam. No pleural effusion or pneumothorax. Bones demineralized. IMPRESSION: Persistent LEFT lower lobe infiltrate with slightly improved aeration in  RIGHT lower lobe. Enlargement of LEFT hilum with mass effect upon the trachea suggesting presence of mediastinal mass or adenopathy; CT chest with contrast recommended to exclude mass and adenopathy. Electronically Signed   By: Lavonia Dana M.D.   On: 09/03/2015 10:56   Dg Chest Port 1 View  Result Date: 09/11/2015 CLINICAL DATA:  Week off, esophageal cancer, accidentally pulled PEG tube, Parkinson's, hypertension EXAM: PORTABLE CHEST 1 VIEW COMPARISON:  Portable exam 1144 hours compared to 01/18/2014 FINDINGS: Rotated to the RIGHT. Enlargement of cardiac silhouette. Atherosclerotic calcification aorta. BILATERAL lower lobe opacities which could represent pneumonia or, in the proper clinical setting, aspiration. Upper lungs clear. No pleural effusion or pneumothorax. No acute osseous findings. BILATERAL glenohumeral joint space narrowing. IMPRESSION: BILATERAL lower lobe infiltrates question pneumonia versus aspiration. Electronically Signed   By: Lavonia Dana M.D.   On: 08/28/2015 11:58    Scheduled Meds:  Continuous Infusions:   Principal Problem:   PEG tube malfunction ([place]) Active Problems:   PD (Parkinson's disease) ([place])   Essential hypertension   Long term current use of anticoagulant   CAP (community acquired pneumonia)    Time spent: [facility] Pager [place]. If 7PM-7AM, please contact night-coverage at [email or web address], password [facility] 09/03/2015, 3:53 PM

## 2015-09-03 NOTE — Progress Notes (Signed)
MD paged due to patient continuing to be agitated, restless and slightly combative following 1mg  of Ativan being given. Safety sitter at bedside, patient re-oriented to surroundings. Will continue to monitor and await a return call.

## 2015-09-03 NOTE — Progress Notes (Signed)
MD paged due to patient's family wanting him to have a suppository. Awaiting a return call.

## 2015-09-03 NOTE — Consult Note (Signed)
Chief Complaint: dislodged g-tube  Referring Physician:Dr. Otis Brace  Supervising Physician: Marybelle Killings  Patient Status: In-pt   HPI: Kyle Cameron is an 82 y.o. male [place] having trouble swallowing due to an esophageal mass.  He underwent endoscopic placement of a g-tube.  He was sent to rehab and returned home, here, on Friday.  His family was taking care of his tube and it dislodged.  He was brought to the [facility] where he was admitted.  [facility] tried to replace it endoscopically, but was unable to pass the mass and he had some bleeding due to friability.  We have been asked to see the patient for possible replacement of his g-tube.  The patient denies any abdominal pain.  His labs are normal and he is AF.  Past Medical History:  Past Medical History:  Diagnosis Date  . Amaurosis fugax 08/16/2014   Right eye  . Anticoagulated on Coumadin   . Benign enlargement of prostate   . Cataracts, bilateral   . CVA (cerebral infarction)   . GERD (gastroesophageal reflux disease)   . Grover's disease   . Headache   . High cholesterol   . History of diverticulitis of colon   . History of renal calculi   . Hypertension   . Hypothyroidism   . Labyrinthitis   . Parkinson's disease ([place])   . Parkinson's disease ([place])   . Prostate atrophy   . Renal calculi   . Stroke [facility])     Past Surgical History:  Past Surgical History:  Procedure Laterality Date  . CATARACT EXTRACTION, BILATERAL    . CHOLECYSTECTOMY  02/04/2012   Procedure: LAPAROSCOPIC CHOLECYSTECTOMY WITH INTRAOPERATIVE CHOLANGIOGRAM;  Surgeon: Merrie Roof, MD;  Location: [facility];  Service: General;  Laterality: N/A;  . LITHOTRIPSY     renal calculi  . TONSILLECTOMY      Family History:  Family History  Problem Relation Age of Onset  . Parkinsonism Mother   . Heart disease Father   . Esophageal cancer Sister     Social History:  reports that he has quit smoking. He has  never used smokeless tobacco. He reports that he drinks about 4.2 oz of alcohol per week . He reports that he does not use drugs.  Allergies:  Allergies  Allergen Reactions  . Ace Inhibitors Other (See Comments)    Cough    Medications: Medications have been reviewed in epic  Please HPI for pertinent positives, otherwise complete 10 system ROS negative.    Physical Exam: BP 134/73 (BP Location: Left Arm)   Pulse (!) 104   Temp 98 F (36.7 C) (Axillary)   Resp 16   SpO2 95%  There is no height or weight on file to calculate BMI. General: pleasant, WD, WN, elderly white male who is laying in bed in NAD HEENT: head is normocephalic, atraumatic.  Sclera are noninjected.  PERRL.  Ears and nose without any masses or lesions.  Mouth is pink and he has a rim of dried bloody around his mouth from hemoptysis.  Heart: regular, rate, and rhythm.  Normal s1,s2. No obvious murmurs, gallops, or rubs noted.  Palpable radial and pedal pulses bilaterally Lungs: rhonchus sounds noted bilaterally.  Respiratory effort nonlabored Abd: soft, NT, ND, +BS, no masses, hernias, or organomegaly, small old tract noted that appears to be healed over Psych: A&Ox3 with an appropriate affect.   Labs: Results for orders placed or  performed during the hospital encounter of 09/17/2015 (from the past 48 hour(s))  CBC     Status: Abnormal   Collection Time: 09/05/2015 12:19 AM  Result Value Ref Range   WBC 10.2 4.0 - 10.5 K/uL   RBC 3.53 (L) 4.22 - 5.81 MIL/uL   Hemoglobin 8.7 (L) 13.0 - 17.0 g/dL   HCT 29.0 (L) 39.0 - 52.0 %   MCV 82.2 78.0 - 100.0 fL   MCH 24.6 (L) 26.0 - 34.0 pg   MCHC 30.0 30.0 - 36.0 g/dL   RDW 14.9 11.5 - 15.5 %   Platelets 423 (H) 150 - 400 K/uL  Basic metabolic panel     Status: Abnormal   Collection Time: 09/19/2015 12:19 AM  Result Value Ref Range   Sodium 134 (L) 135 - 145 mmol/L   Potassium 4.1 3.5 - 5.1 mmol/L   Chloride 98 (L) 101 - 111 mmol/L   CO2 27 22 - 32 mmol/L   Glucose,  Bld 108 (H) 65 - 99 mg/dL   BUN 42 (H) 6 - 20 mg/dL   Creatinine, Ser 1.17 0.61 - 1.24 mg/dL   Calcium 8.5 (L) 8.9 - 10.3 mg/dL   GFR calc non Af Amer 55 (L) >60 mL/min   GFR calc Af Amer >60 >60 mL/min    Comment: (NOTE) The eGFR has been calculated using the CKD EPI equation. This calculation has not been validated in all clinical situations. eGFR's persistently <60 mL/min signify possible Chronic Kidney Disease.    Anion gap 9 5 - 15  Protime-INR     Status: Abnormal   Collection Time: 08/30/2015 12:19 AM  Result Value Ref Range   Prothrombin Time 23.2 (H) 11.4 - 15.2 seconds   INR 2.02   Protime-INR     Status: Abnormal   Collection Time: 09/03/15  2:52 AM  Result Value Ref Range   Prothrombin Time 25.7 (H) 11.4 - 15.2 seconds   INR 2.30   CBC with Differential/Platelet     Status: Abnormal   Collection Time: 09/03/15  2:52 AM  Result Value Ref Range   WBC 9.5 4.0 - 10.5 K/uL   RBC 3.59 (L) 4.22 - 5.81 MIL/uL   Hemoglobin 8.9 (L) 13.0 - 17.0 g/dL   HCT 29.5 (L) 39.0 - 52.0 %   MCV 82.2 78.0 - 100.0 fL   MCH 24.8 (L) 26.0 - 34.0 pg   MCHC 30.2 30.0 - 36.0 g/dL   RDW 14.7 11.5 - 15.5 %   Platelets 451 (H) 150 - 400 K/uL   Neutrophils Relative % 77 %   Neutro Abs 7.3 1.7 - 7.7 K/uL   Lymphocytes Relative 13 %   Lymphs Abs 1.2 0.7 - 4.0 K/uL   Monocytes Relative 9 %   Monocytes Absolute 0.8 0.1 - 1.0 K/uL   Eosinophils Relative 1 %   Eosinophils Absolute 0.1 0.0 - 0.7 K/uL   Basophils Relative 0 %   Basophils Absolute 0.0 0.0 - 0.1 K/uL  Basic metabolic panel     Status: Abnormal   Collection Time: 09/03/15  2:52 AM  Result Value Ref Range   Sodium 133 (L) 135 - 145 mmol/L   Potassium 4.1 3.5 - 5.1 mmol/L   Chloride 99 (L) 101 - 111 mmol/L   CO2 23 22 - 32 mmol/L   Glucose, Bld 130 (H) 65 - 99 mg/dL   BUN 27 (H) 6 - 20 mg/dL   Creatinine, Ser 1.16 0.61 - 1.24 mg/dL  Calcium 8.2 (L) 8.9 - 10.3 mg/dL   GFR calc non Af Amer 55 (L) >60 mL/min   GFR calc Af Amer >60  >60 mL/min    Comment: (NOTE) The eGFR has been calculated using the CKD EPI equation. This calculation has not been validated in all clinical situations. eGFR's persistently <60 mL/min signify possible Chronic Kidney Disease.    Anion gap 11 5 - 15  Glucose, capillary     Status: Abnormal   Collection Time: 09/03/15  8:58 AM  Result Value Ref Range   Glucose-Capillary 113 (H) 65 - 99 mg/dL    Imaging: Dg Chest Port 1 View  Result Date: 09/10/2015 CLINICAL DATA:  Week off, esophageal cancer, accidentally pulled PEG tube, Parkinson's, hypertension EXAM: PORTABLE CHEST 1 VIEW COMPARISON:  Portable exam 1144 hours compared to 01/18/2014 FINDINGS: Rotated to the RIGHT. Enlargement of cardiac silhouette. Atherosclerotic calcification aorta. BILATERAL lower lobe opacities which could represent pneumonia or, in the proper clinical setting, aspiration. Upper lungs clear. No pleural effusion or pneumothorax. No acute osseous findings. BILATERAL glenohumeral joint space narrowing. IMPRESSION: BILATERAL lower lobe infiltrates question pneumonia versus aspiration. Electronically Signed   By: Lavonia Dana M.D.   On: 09/01/2015 11:58    Assessment/Plan 1. Esophageal mass, dislodged g-tube -I have discussed this case with Dr. Barbie Banner who has a few concerns.  This tube is fresh and was just placed 2 weeks ago.  Since it was placed endoscopically, the stomach is not pexied to the abdominal wall and he may have a hole in his stomach leaking contents; however the patient denies any abdominal pain and he has normal labs and no fevers.  The other concern is given the stomach has likely fallen away from the abdominal wall and he has a hole in the stomach, trying to replace a new g-tube may be quit difficult. -His INR is elevated at 2.3.  If we were able to do something to replace his tube, this would need to be closer towards 1.5.   -I will discuss this further with him for more recommendations.  Thank you for this  interesting consult.  I greatly enjoyed meeting Kyle Cameron and look forward to participating in their care.  A copy of this report was sent to the requesting provider on this date.  Electronically Signed: Henreitta Cea 09/03/2015, 9:30 AM   I spent a total of 20 Minutes    in face to face in clinical consultation, greater than 50% of which was counseling/coordinating care for dislodged g-tube

## 2015-09-03 NOTE — Progress Notes (Signed)
Per Dr. Aggie Moats, patient is to be transferred to step down after his CT scan.

## 2015-09-03 NOTE — Progress Notes (Signed)
[facility] Progress Note  Kyle Cameron 82 y.o. 1926-02-07   Subjective: Patient somewhat confused this morning . Alert. Denied abdominal pain.   Objective: Vital signs in last 24 hours: Vitals:   09/12/2015 2207 09/03/15 0542  BP: 136/82 134/73  Pulse: 98 (!) 104  Resp: 16 16  Temp: 97.7 F (36.5 C) 98 F (36.7 C)    Physical Exam:  General:  Alert, confused, no distress, appears stated age  Head:  Normocephalic, without obvious abnormality, atraumatic  Eyes:  , EOM's intact,   Lungs:   Coarse breath sounds   Heart:  Regular rate and rhythm, S1, S2 normal  Abdomen:   Soft, non-tender, bowel sounds active all four quadrants,  no masses, PEG site looks ok   Extremities: Extremities normal, atraumatic, no  edema  Pulses: 2+ and symmetric    Lab Results:  Recent Labs  08/28/2015 0019 09/03/15 0252  NA 134* 133*  K 4.1 4.1  CL 98* 99*  CO2 27 23  GLUCOSE 108* 130*  BUN 42* 27*  CREATININE 1.17 1.16  CALCIUM 8.5* 8.2*   No results for input(s): AST, ALT, ALKPHOS, BILITOT, PROT, ALBUMIN in the last 72 hours.  Recent Labs  09/06/2015 0019 09/03/15 0252  WBC 10.2 9.5  NEUTROABS  --  7.3  HGB 8.7* 8.9*  HCT 29.0* 29.5*  MCV 82.2 82.2  PLT 423* 451*    Recent Labs  09/20/2015 0019 09/03/15 0252  LABPROT 23.2* 25.7*  INR 2.02 2.30      Assessment/Plan: - Displaced PEG tube .  - Esophageal cancer - recently Diagnosed 08/16/2015  - Recent stroke causing Oropharyngeal Dysphagia S/P PEG placement 08/16/15. Coimadin on Hold. - Parkinson Disease   PLAN ------ - EGD yesterday showed friable upper esophageal mass with small amount of bleeding.Did not feel comfortable advancing scope because of resistance.  - D/W Radiology PA. INR is 2.3 . Possible plan for replacement tomorrow  - Vitamin K IV  - NG tube  - NPO for now     Kyle Cameron 09/03/2015, 9:14 AM  Pager [phone]  If no answer or after 5 PM call [phone]

## 2015-09-03 NOTE — Progress Notes (Signed)
Patient transported to CT for CTA chest then transferred to 3S07 via bed with heart monitor.  RN at bedside to receive patient.

## 2015-09-04 DIAGNOSIS — K9423 Gastrostomy malfunction: Secondary | ICD-10-CM

## 2015-09-04 MED ORDER — HALOPERIDOL LACTATE 5 MG/ML IJ SOLN: 0.5000 mg | INTRAMUSCULAR | Status: DC | PRN

## 2015-09-04 MED ORDER — HALOPERIDOL LACTATE 2 MG/ML PO CONC
0.5000 mg | ORAL | Status: DC | PRN
  Filled 2015-09-04: qty 0.3

## 2015-09-04 MED ORDER — WHITE PETROLATUM GEL
Status: AC
  Administered 2015-09-04: 14:00:00
  Filled 2015-09-04: qty 1

## 2015-09-04 MED ORDER — HYDROMORPHONE BOLUS VIA INFUSION
1.0000 mg | INTRAVENOUS | Status: DC | PRN
  Administered 2015-09-04 (×2): 1 mg via INTRAVENOUS
  Filled 2015-09-04: qty 1

## 2015-09-04 MED ORDER — GLYCOPYRROLATE 1 MG PO TABS
1.0000 mg | ORAL_TABLET | ORAL | Status: DC | PRN
  Filled 2015-09-04: qty 1

## 2015-09-04 MED ORDER — GLYCOPYRROLATE 0.2 MG/ML IJ SOLN: 0.2000 mg | INTRAMUSCULAR | Status: DC | PRN

## 2015-09-04 MED ORDER — HALOPERIDOL 0.5 MG PO TABS
0.5000 mg | ORAL_TABLET | ORAL | Status: DC | PRN
  Filled 2015-09-04: qty 1

## 2015-09-04 MED ORDER — BIOTENE DRY MOUTH MT LIQD: 15.0000 mL | OROMUCOSAL | Status: DC | PRN

## 2015-09-04 MED ORDER — GLYCOPYRROLATE 0.2 MG/ML IJ SOLN
0.2000 mg | INTRAMUSCULAR | Status: DC | PRN
  Administered 2015-09-04 (×2): 0.2 mg via INTRAVENOUS
  Filled 2015-09-04 (×2): qty 1

## 2015-09-04 MED ORDER — MORPHINE SULFATE 25 MG/ML IV SOLN
10.0000 mg/h | INTRAVENOUS | Status: DC
  Administered 2015-09-04: 10 mg/h via INTRAVENOUS
  Filled 2015-09-04: qty 10

## 2015-09-04 MED ORDER — MORPHINE BOLUS VIA INFUSION
2.0000 mg | INTRAVENOUS | Status: DC | PRN
  Administered 2015-09-04: 2 mg via INTRAVENOUS
  Filled 2015-09-04: qty 2

## 2015-09-04 MED ORDER — MORPHINE SULFATE (PF) 2 MG/ML IV SOLN
2.0000 mg | Freq: Once | INTRAVENOUS | Status: AC
  Administered 2015-09-04: 2 mg via INTRAVENOUS
  Filled 2015-09-04: qty 1

## 2015-09-04 MED ORDER — POLYVINYL ALCOHOL 1.4 % OP SOLN
1.0000 [drp] | Freq: Four times a day (QID) | OPHTHALMIC | Status: DC | PRN
  Filled 2015-09-04: qty 15

## 2015-09-05 ENCOUNTER — Encounter: Payer: Medicare Other | Admitting: Speech Pathology

## 2015-09-05 ENCOUNTER — Encounter: Payer: Medicare Other | Admitting: Occupational Therapy

## 2015-09-05 ENCOUNTER — Ambulatory Visit: Payer: Medicare Other | Admitting: Physical Therapy

## 2015-09-07 ENCOUNTER — Encounter: Payer: Medicare Other | Admitting: Occupational Therapy

## 2015-09-07 ENCOUNTER — Ambulatory Visit: Payer: Medicare Other | Admitting: Physical Therapy

## 2015-09-12 ENCOUNTER — Ambulatory Visit: Payer: Medicare Other | Admitting: Physical Therapy

## 2015-09-12 ENCOUNTER — Encounter: Payer: Medicare Other | Admitting: Occupational Therapy

## 2015-09-13 ENCOUNTER — Ambulatory Visit: Payer: Medicare Other | Admitting: Physical Therapy

## 2015-09-13 ENCOUNTER — Encounter: Payer: Medicare Other | Admitting: Occupational Therapy

## 2015-09-22 NOTE — Progress Notes (Signed)
PROGRESS NOTE  Kyle Cameron [id number] DOB: 1926-01-26 DOA: 09/03/2015 PCP: Kandice Hams, MD  HPI/Recap of past 24 hours: 82 yo male with Parkinson's disease and esophageal cancer admitted for PEG placement and found to have bilateral pneumonia and erosion of tumor into airways. He is now on comfort care and his partner [name] is present at the bedside this morning. The patient is not responsive but showing signs of discomfort with shortness of breath and gurgling.   Assessment/Plan: Principal Problem:   PEG tube malfunction (HCC) Active Problems:   PD (Parkinson's disease) ([place])   Essential hypertension   Long term current use of anticoagulant   CAP (community acquired pneumonia)   SOB (shortness of breath)   Encounter for palliative care   Goals of care, counseling/discussion  Patient was started on Dilaudid drip last night, I have ordered STAT morphine injections as well as transition to high dose morphine drip. I've also used palliative care order set. Discussed with floor RN, Dr. Rowe Pavy of Palliative care and partner [name] this AM.  Code Status: DNR   Disposition Plan: Anticipate hospital death in next 24 hours.    Consultants:  Palliative  IR   Procedures:  None   Antimicrobials:  None    Objective: Vitals:   09-16-15 0700 16-Sep-2015 0800 09/16/15 0900 09-16-15 1000  BP:  132/73    Pulse: 98 98 (!) 117 (!) 104  Resp: (!) 22 14 11 12   Temp:  99.2 F (37.3 C)    TempSrc:  Axillary    SpO2: 99% 99% 96% 91%    Intake/Output Summary (Last 24 hours) at 09-16-15 1222 Last data filed at 16-Sep-2015 1000  Gross per 24 hour  Intake            17.72 ml  Output              250 ml  Net          -232.28 ml   There were no vitals filed for this visit.  Exam: General:  Alert, disoriented, agitated....but more calm and sleeping after morphine given Cardiovascular: tachycardic Respiratory: diffuse rhonchi, wheezing Abdomen: soft, nondistended Skin: dry, no  rashes  Musculoskeletal: no joint effusions, normal range of motion  Psychiatric: agitated, not responsive to stimuli Neurologic: moving all extremities   Data Reviewed: CBC:  Recent Labs Lab 09/01/2015 0019 09/03/15 0252  WBC 10.2 9.5  NEUTROABS  --  7.3  HGB 8.7* 8.9*  HCT 29.0* 29.5*  MCV 82.2 82.2  PLT 423* [id number]*   Basic Metabolic Panel:  Recent Labs Lab 09/09/2015 0019 09/03/15 0252  NA 134* 133*  K 4.1 4.1  CL 98* 99*  CO2 27 23  GLUCOSE 108* 130*  BUN 42* 27*  CREATININE 1.17 1.16  CALCIUM 8.5* 8.2*   GFR: Estimated Creatinine Clearance: 37 mL/min (by C-G formula based on SCr of 1.16 mg/dL). Liver Function Tests: No results for input(s): AST, ALT, ALKPHOS, BILITOT, PROT, ALBUMIN in the last 168 hours. No results for input(s): LIPASE, AMYLASE in the last 168 hours. No results for input(s): AMMONIA in the last 168 hours. Coagulation Profile:  Recent Labs Lab 08/24/2015 0019 09/03/15 0252  INR 2.02 2.30   Cardiac Enzymes: No results for input(s): CKTOTAL, CKMB, CKMBINDEX, TROPONINI in the last 168 hours. BNP (last 3 results) No results for input(s): PROBNP in the last 8760 hours. HbA1C: No results for input(s): HGBA1C in the last 72 hours. CBG:  Recent Labs Lab 09/03/15 0858  GLUCAP 113*  Lipid Profile: No results for input(s): CHOL, HDL, LDLCALC, TRIG, CHOLHDL, LDLDIRECT in the last 72 hours. Thyroid Function Tests: No results for input(s): TSH, T4TOTAL, FREET4, T3FREE, THYROIDAB in the last 72 hours. Anemia Panel: No results for input(s): VITAMINB12, FOLATE, FERRITIN, TIBC, IRON, RETICCTPCT in the last 72 hours. Urine analysis:    Component Value Date/Time   COLORURINE YELLOW 01/18/2014 [place] 01/18/2014 0938   LABSPEC 1.020 01/18/2014 0938   PHURINE 5.5 01/18/2014 0938   GLUCOSEU NEGATIVE 01/18/2014 0938   HGBUR NEGATIVE 01/18/2014 0938   BILIRUBINUR NEGATIVE 01/18/2014 0938   KETONESUR NEGATIVE 01/18/2014 0938    PROTEINUR NEGATIVE 01/18/2014 0938   UROBILINOGEN 0.2 01/18/2014 0938   NITRITE NEGATIVE 01/18/2014 0938   LEUKOCYTESUR NEGATIVE 01/18/2014 0938   Sepsis Labs: @LABRCNTIP (procalcitonin:4,lacticidven:4)  ) Recent Results (from the past 240 hour(s))  MRSA PCR Screening     Status: None   Collection Time: 09/03/15  2:41 PM  Result Value Ref Range Status   MRSA by PCR NEGATIVE NEGATIVE Final    Comment:        The GeneXpert MRSA Assay (FDA approved for NASAL specimens only), is one component of a comprehensive MRSA colonization surveillance program. It is not intended to diagnose MRSA infection nor to guide or monitor treatment for MRSA infections.       Studies: Ct Chest W Contrast  Result Date: 09/03/2015 CLINICAL DATA:  82 year old male with productive cough for 2 days. Shortness of breath. Esophageal cancer. Initial encounter. EXAM: CT CHEST WITH CONTRAST TECHNIQUE: Multidetector CT imaging of the chest was performed during intravenous contrast administration. CONTRAST:  85mL ISOVUE-300 IOPAMIDOL (ISOVUE-300) INJECTION 61% COMPARISON:  Chest radiographs 1041 hours today and earlier. CT Abdomen and Pelvis 02/03/2012. Chest CTA 09/11/2009. FINDINGS: Massive mediastinal tumor infiltrating outwardly from the esophagus in the posterior mediastinum encompassing 7.1 by 8.2 by 16.1 cm (AP by transverse by CC). Associated regional mass effect. The tumor appears centrally necrotic in the region of the esophageal lumen at the carina, with gas bubbles located centrally. The more proximal esophagus at the thoracic inlet is markedly dilated up to 4.4 cm diameter. Tumor has engulfed bronchial artery branches arising from the descending thoracic aorta. Tumor has eroded into the mainstem bronchi. There are retained secretions in the right mainstem bronchus. Tumor has infiltrated into the AP window and toward both hila. Superimposed malignant anterior carina and other mediastinal lymph nodes up to 16 mm  individually. The distal most thoracic esophagus and gastroesophageal junction are spared. No pericardial effusion. Moderate to large layering left and moderate layering right pleural effusions. Abnormal peribronchial ground-glass and more confluent pulmonary opacity in all lobes. Superimposed septal thickening. Calcified aortic atherosclerosis. Surgically absent gallbladder. Stable visible liver, spleen, pancreas, and visualized intestines in the upper abdomen since 2014. There is new bilateral hydronephrosis, moderate to severe. The entire kidneys are not included. Obstructing etiology is not identified. L1 inferior endplate compression fracture with mildly displaced endplate fragments. This appears somewhat subacute, with some periosteal new bone formation. However, there is motion artifact at this level of the scan. Despite the extensive posterior mediastinal tumor, no infiltration of the thoracic spine is identified. Other visualized osseous structures appear stable. IMPRESSION: 1. Massive centrally necrotic mediastinal tumor infiltrating outwardly from the esophagus. Tumor encompasses 7.1 x 8.2 x 16.1 cm (AP by transverse by CC). Erosion into the mainstem bronchi suspected. Obstruction of the proximal esophagus which is dilated up to 4 cm. Anterior carina malignant lymphadenopathy. 2. Superimposed bilateral multilobar  bronchopneumonia with moderate to large layering pleural effusions greater on the left. 3. Subacute appearing L1 inferior endplate compression fracture. Burtis Junes this is a benign fracture as no other osseous metastatic disease is identified. Electronically Signed   By: Genevie Ann M.D.   On: 09/03/2015 14:44    Scheduled Meds:   Continuous Infusions: . morphine 10 mg/hr (11-Sep-2015 0927)     LOS: 1 day   Time spent: 29  Shenandoah Yeats Marry Guan, MD [facility] Pager [phone]  If 7PM-7AM, please contact night-coverage [email or web address] Password [facility] September 11, 2015, 12:22 PM

## 2015-09-22 NOTE — Accreditation Note (Signed)
o Restraints reported to CMS  Pursuant to regulation 482.13 (G) (3) use of restraints was logged and CMS was notified via email on 08.16.2017 at [place] by Evette Cristal, RN, Patient Safety and Accreditation.

## 2015-09-22 NOTE — Progress Notes (Signed)
   09/26/15 0900  Clinical Encounter Type  Visited With Other (Comment);Family;Patient (partner)  Visit Type Patient actively dying;Critical Care  Referral From Palliative care team  Spiritual Encounters  Spiritual Needs Emotional;Grief support;Ritual  Ch responded to EOL consul from [place] team; [facility] spoke with partner and was notified that family arriving today from [place].  [place] is [place]; [facility] will contact on-call priest for Anointing of the sick ASAP. [place] available as needed. Gwynn Burly 9:32 AM

## 2015-09-22 NOTE — Care Management Note (Signed)
Case Management Note  Patient Details  Name: Kyle Cameron MRN: [id number] Date of Birth: 10/03/1926  Subjective/Objective:     Patient comfort care, expired.               Action/Plan:   Expected Discharge Date:                  Expected Discharge Plan:  Expired  In-House Referral:     Discharge planning Services  CM Consult  Post Acute Care Choice:    Choice offered to:     DME Arranged:    DME Agency:     HH Arranged:    [place] Agency:     Status of Service:  Completed, signed off  If discussed at [employer] of Stay Meetings, dates discussed:    Additional Comments:  Zenon Mayo, RN September 13, 2015, 3:29 PM

## 2015-09-22 NOTE — Progress Notes (Signed)
Daily Progress Note   Patient Name: Kyle Cameron       Date: 09/29/15 DOB: 11-29-1926  Age: 82 y.o. MRN#: [id number] Attending Physician: Mir Marry Guan,* Primary Care Physician: Kandice Hams, MD Admit Date: 09/18/2015  Reason for Consultation/Follow-up: terminal care   Subjective:  Unresponsive: See below  Length of Stay: 1  Current Medications: Scheduled Meds:     Continuous Infusions: . morphine 10 mg/hr (09-29-2015 0927)    PRN Meds: acetaminophen **OR** acetaminophen, albuterol, antiseptic oral rinse, bisacodyl, glycopyrrolate **OR** glycopyrrolate **OR** glycopyrrolate, haloperidol **OR** haloperidol **OR** haloperidol lactate, LORazepam **OR** LORazepam **OR** LORazepam, morphine, ondansetron **OR** ondansetron (ZOFRAN) IV, polyvinyl alcohol  Physical Exam         Weak appearing elderly gentleman resting in bed Essentially unresponsive S1-S2 Coarse breath sounds anteriorly Abdomen soft Some degree of coolness/mottling bilateral upper extremities and lower extremities  Vital Signs: BP 132/73 (BP Location: Right Arm)   Pulse (!) 104   Temp 99.2 F (37.3 C) (Axillary)   Resp 12   SpO2 91%  SpO2: SpO2: 91 % O2 Device: O2 Device: Nasal Cannula O2 Flow Rate: O2 Flow Rate (L/min): 4 L/min  Intake/output summary:  Intake/Output Summary (Last 24 hours) at 2015/09/29 1147 Last data filed at 09/29/2015 1000  Gross per 24 hour  Intake            17.72 ml  Output              250 ml  Net          -232.28 ml   LBM: Last BM Date: 09/10/2015 Baseline Weight:   Most recent weight:         Palliative Assessment/Data:    Flowsheet Rows   Flowsheet Row Most Recent Value  Intake Tab  Referral Department  Hospitalist  Unit at Time of Referral  Intermediate  Care Unit  Palliative Care Primary Diagnosis  Cancer  Date Notified  09/03/15  Palliative Care Type  Return patient Palliative Care  Reason for referral  Non-pain Symptom, End of Life Care Assistance  Date of Admission  09/16/2015  Date first seen by Palliative Care  09/03/15  # of days Palliative referral response time  0 Day(s)  # of days IP prior to Palliative referral  2  Clinical Assessment  Palliative Performance Scale Score  10%  Pain Max last 24 hours  6  Pain Min Last 24 hours  5  Dyspnea Max Last 24 Hours  5  Dyspnea Min Last 24 hours  4  Nausea Max Last 24 Hours  5  Nausea Min Last 24 Hours  4  Anxiety Max Last 24 Hours  8  Anxiety Min Last 24 Hours  7  Psychosocial & Spiritual Assessment  Palliative Care Outcomes  Patient/Family meeting held?  Yes  Who was at the meeting?  Partner [name] who is also his healthcare power of attorney agent.  Palliative Care Outcomes  Clarified goals of care  Palliative Care follow-up planned  No      Patient Active Problem List   Diagnosis Date Noted  . SOB (shortness of breath)   . Encounter for palliative care   . Goals of care, counseling/discussion   . PEG tube malfunction ([place]) 09/20/2015  . CAP (community acquired pneumonia)   . Dysphagia 08/12/2015  . Essential (primary) hypertension 12/27/2014  . Swelling of limb 12/27/2014  . Gastro-esophageal reflux disease without esophagitis 12/27/2014  . Atrial thrombus following MI ([place]) 12/27/2014  . Hypothyroidism 12/27/2014  . Mixed hyperlipidemia 12/27/2014  . Parkinson's disease ([place]) 12/27/2014  . Sequelae of cerebral infarction 12/27/2014  . Amaurosis fugax 08/16/2014  . Long term current use of anticoagulant 07/08/2014  . Cerebral infarction due to embolism of left middle cerebral artery ([place]) 06/22/2014  . Discoloration of skin of foot 03/15/2014  . PD (Parkinson's disease) ([place]) 03/15/2014  . Cerebral infarction due to embolism of cerebral artery ([place]) 03/15/2014  .  Essential hypertension 03/15/2014  . HLD (hyperlipidemia) 03/15/2014  . Cough   . LV (left ventricular) mural thrombus ([place])   . CVA (cerebral infarction) 01/18/2014  . Imbalance 01/18/2014  . Slurring of speech   . Abnormality of gait 03/12/2012  . Paralysis agitans ([place]) 03/12/2012  . Acute cholecystitis 02/04/2012  . Hypertension 02/04/2012  . Hyperlipidemia 02/04/2012    Palliative Care Assessment & Plan   Patient Profile:    Assessment:  Necrotic esophageal mass eroding into the primary bronchus Actively dying Possibly terminal agitation Increased his secretions at end-of-life Admitted for PEG tube dislodgment History of Parkinson's syndrome  Recommendations/Plan:   Continue comfort measures. Discussed with Dr. Renaee Munda, also discussed with patient's partner [name] healthcare power of attorney agent present at the bedside. Brief life review performed. Patient had been losing weight and had been having swallowing difficulties. It was deemed likely a progression of his Parkinson syndrome. However, when PEG tube placement was being done, esophageal mass was noted. [name] is thankful for chaplain visit. He is agreeable to current comfort measures. End-of-life signs and symptoms discussed in detail. Patient already having spells of apnea, or less/mottling. Discussed about prognosis likely less than 24 hours at this point. Patient has some siblings who are still alive. Especially, his older sister who is around 56 years old is trying to get here from [place] today.  Goals of Care and Additional Recommendations:  Limitations on Scope of Treatment: Full Comfort Care  Code Status:    Code Status Orders        Start     Ordered   10-04-2015 0907  Do not attempt resuscitation (DNR)  Continuous    Question Answer Comment  In the event of cardiac or respiratory ARREST Do not call a "code blue"   In the event of cardiac or respiratory ARREST Do not perform Intubation, CPR,  defibrillation or ACLS  In the event of cardiac or respiratory ARREST Use medication by any route, position, wound care, and other measures to relive pain and suffering. May use oxygen, suction and manual treatment of airway obstruction as needed for comfort.      2015-09-27 [id number]    Code Status History    Date Active Date Inactive Code Status Order ID Comments User Context   09/03/2015  3:41 PM 2015-09-27  9:07 AM DNR [id number]  Elwin Mocha, MD Inpatient   09/18/2015 12:10 AM 09/03/2015  3:41 PM DNR [id number]  Etta Quill, DO ED   01/18/2014  4:03 PM 01/19/2014  5:04 PM DNR [id number]  Thurnell Lose, MD Inpatient   02/04/2012  3:36 AM 02/06/2012  4:25 PM Full Code [id number]  Wendall Mola, RN Inpatient    Advance Directive Documentation   Flowsheet Row Most Recent Value  Type of Advance Directive  Out of facility DNR (pink MOST or yellow form)  Pre-existing out of facility DNR order (yellow form or pink MOST form)  No data  "MOST" Form in Place?  No data       Prognosis:   Hours - Days, Possibly final 24 hours.  Discharge Planning:  Anticipated Hospital Death  Care plan was discussed with Dr Renaee Munda as well as Ok Anis you for allowing the Palliative Medicine Team to assist in the care of this patient.   Time In:  8 Time Out: 835 Total Time 35 Prolonged Time Billed  no       Greater than 50%  of this time was spent counseling and coordinating care related to the above assessment and plan.  Loistine Chance, MD [phone]  Please contact Palliative Medicine Team phone at [phone] for questions and concerns.

## 2015-09-22 NOTE — Progress Notes (Signed)
Wasted 200 mls of morphine with lee worley rn

## 2015-09-22 NOTE — Progress Notes (Signed)
Patient ID: Kyle Cameron, male   DOB: 1926-08-29, 82 y.o.   MRN: [id number]   Request for Gastric tube replacement vs new placement Dr Annamaria Boots and Dr Barbie Banner have both reviewed chart and imaging  Secondary to esophageal and mediastinal mass Would difficult if not impossible to safely place G tube in IR IR recommend surgical referral for this patient  INR 2.3 8/13  will inform MD

## 2015-09-22 NOTE — Discharge Summary (Signed)
Discharge Death Summary  Kyle Cameron [id number] DOB: [birth year]  PCP: Kandice Hams, MD  Admit date: 09/19/2015 Discharge date: September 12, 2015   Discharge Diagnoses:  Active Hospital Problems   Diagnosis Date Noted  . PEG tube malfunction ([place]) 08/30/2015  . SOB (shortness of breath)   . Encounter for palliative care   . Goals of care, counseling/discussion   . CAP (community acquired pneumonia)   . Long term current use of anticoagulant 07/08/2014  . Essential hypertension 03/15/2014  . PD (Parkinson's disease) ([place]) 03/15/2014    Resolved Hospital Problems   Diagnosis Date Noted Date Resolved  No resolved problems to display.    Vitals:   September 12, 2015 1515 09/12/15 1520  BP:    Pulse:    Resp: (!) 0 (!) 0  Temp:      History of present illness:  82 yo male with Parkinson's disease and esophageal cancer admitted for PEG placement (IR consult was obtained for this) and found to have bilateral pneumonia and erosion of tumor into airways.   Hospital Course:  Principal Problem:   PEG tube malfunction ([place]) Active Problems:   PD (Parkinson's disease) ([place])   Essential hypertension   Long term current use of anticoagulant   CAP (community acquired pneumonia)   SOB (shortness of breath)   Encounter for palliative care   Goals of care, counseling/discussion  Patient was placed on comfort care only and palliative care was consulted when airway invasion was discovered. Partner [name] was at the bedside early this AM and patient was kept comfortable on morphine drip. He passed in no distress at approximately 15:30.  Procedures:  None   Consultations:  IR  Palliative Care   Discharge Exam: BP 132/73 (BP Location: Right Arm)   Pulse 88   Temp 99.2 F (37.3 C) (Axillary)   Resp (!) 0   SpO2 (!) 72%  General: No response to tactile or verbal stimuli Eyes: pupils fixed Cardiovascular: no heart sounds      The results of significant diagnostics from this  hospitalization (including imaging, microbiology, ancillary and laboratory) are listed below for reference.    Significant Diagnostic Studies: Ct Chest W Contrast  Result Date: 09/03/2015 CLINICAL DATA:  82 year old male with productive cough for 2 days. Shortness of breath. Esophageal cancer. Initial encounter. EXAM: CT CHEST WITH CONTRAST TECHNIQUE: Multidetector CT imaging of the chest was performed during intravenous contrast administration. CONTRAST:  75mL ISOVUE-300 IOPAMIDOL (ISOVUE-300) INJECTION 61% COMPARISON:  Chest radiographs 1041 hours today and earlier. CT Abdomen and Pelvis 02/03/2012. Chest CTA 09/11/2009. FINDINGS: Massive mediastinal tumor infiltrating outwardly from the esophagus in the posterior mediastinum encompassing 7.1 by 8.2 by 16.1 cm (AP by transverse by CC). Associated regional mass effect. The tumor appears centrally necrotic in the region of the esophageal lumen at the carina, with gas bubbles located centrally. The more proximal esophagus at the thoracic inlet is markedly dilated up to 4.4 cm diameter. Tumor has engulfed bronchial artery branches arising from the descending thoracic aorta. Tumor has eroded into the mainstem bronchi. There are retained secretions in the right mainstem bronchus. Tumor has infiltrated into the AP window and toward both hila. Superimposed malignant anterior carina and other mediastinal lymph nodes up to 16 mm individually. The distal most thoracic esophagus and gastroesophageal junction are spared. No pericardial effusion. Moderate to large layering left and moderate layering right pleural effusions. Abnormal peribronchial ground-glass and more confluent pulmonary opacity in all lobes. Superimposed septal thickening. Calcified aortic atherosclerosis. Surgically absent gallbladder. Stable visible  liver, spleen, pancreas, and visualized intestines in the upper abdomen since 2014. There is new bilateral hydronephrosis, moderate to severe. The entire  kidneys are not included. Obstructing etiology is not identified. L1 inferior endplate compression fracture with mildly displaced endplate fragments. This appears somewhat subacute, with some periosteal new bone formation. However, there is motion artifact at this level of the scan. Despite the extensive posterior mediastinal tumor, no infiltration of the thoracic spine is identified. Other visualized osseous structures appear stable. IMPRESSION: 1. Massive centrally necrotic mediastinal tumor infiltrating outwardly from the esophagus. Tumor encompasses 7.1 x 8.2 x 16.1 cm (AP by transverse by CC). Erosion into the mainstem bronchi suspected. Obstruction of the proximal esophagus which is dilated up to 4 cm. Anterior carina malignant lymphadenopathy. 2. Superimposed bilateral multilobar bronchopneumonia with moderate to large layering pleural effusions greater on the left. 3. Subacute appearing L1 inferior endplate compression fracture. Burtis Junes this is a benign fracture as no other osseous metastatic disease is identified. Electronically Signed   By: Genevie Ann M.D.   On: 09/03/2015 14:44   Dg Chest Port 1 View  Result Date: 09/03/2015 CLINICAL DATA:  Shortness of breath, productive cough for 2 days, congestion, history stroke, hypertension, former smoker, Parkinson's EXAM: PORTABLE CHEST 1 VIEW COMPARISON:  Portable exam 1041 hours compared to 08/24/2015 and 01/18/2014. FINDINGS: Upper normal heart size. Atherosclerotic calcification. Significant mass effect upon the LEFT lateral aspect of the trachea suggesting presence of mediastinal mass or adenopathy, increased in prominence since 2015. Slight prominence of RIGHT paratracheal soft tissues. Mild LEFT hilar fullness, more conspicuous than on the previous study due to minimal rotation to the RIGHT. Infiltrates in LEFT lower lobe persist, with improved aeration in RIGHT lower lobe since previous exam. No pleural effusion or pneumothorax. Bones demineralized.  IMPRESSION: Persistent LEFT lower lobe infiltrate with slightly improved aeration in RIGHT lower lobe. Enlargement of LEFT hilum with mass effect upon the trachea suggesting presence of mediastinal mass or adenopathy; CT chest with contrast recommended to exclude mass and adenopathy. Electronically Signed   By: Lavonia Dana M.D.   On: 09/03/2015 10:56   Dg Chest Port 1 View  Result Date: 08/22/2015 CLINICAL DATA:  Week off, esophageal cancer, accidentally pulled PEG tube, Parkinson's, hypertension EXAM: PORTABLE CHEST 1 VIEW COMPARISON:  Portable exam 1144 hours compared to 01/18/2014 FINDINGS: Rotated to the RIGHT. Enlargement of cardiac silhouette. Atherosclerotic calcification aorta. BILATERAL lower lobe opacities which could represent pneumonia or, in the proper clinical setting, aspiration. Upper lungs clear. No pleural effusion or pneumothorax. No acute osseous findings. BILATERAL glenohumeral joint space narrowing. IMPRESSION: BILATERAL lower lobe infiltrates question pneumonia versus aspiration. Electronically Signed   By: Lavonia Dana M.D.   On: 09/05/2015 11:58    Microbiology: Recent Results (from the past 240 hour(s))  MRSA PCR Screening     Status: None   Collection Time: 09/03/15  2:41 PM  Result Value Ref Range Status   MRSA by PCR NEGATIVE NEGATIVE Final    Comment:        The GeneXpert MRSA Assay (FDA approved for NASAL specimens only), is one component of a comprehensive MRSA colonization surveillance program. It is not intended to diagnose MRSA infection nor to guide or monitor treatment for MRSA infections.      Time spent: 32 minutes were spent in preparing this discharge summary  Signed:  [place]  [facility] 29-Sep-2015, 5:41 PM

## 2015-09-22 NOTE — Progress Notes (Addendum)
Wasted 200 mls morphine gtt. Witnessed by [employer]. Starr Sinclair RN

## 2015-09-22 DEATH — deceased

## 2017-06-05 IMAGING — CR DG CHEST 1V PORT
1 series · 1 of 1 positions shown · non-contrast
Comparison: Portable exam 1681 hours compared to 09/02/2015 and
01/18/2014.

CLINICAL DATA: Shortness of breath, productive cough for 2 days,
congestion, history stroke, hypertension, former smoker, Parkinson's

EXAM:
PORTABLE CHEST 1 VIEW

[AP]
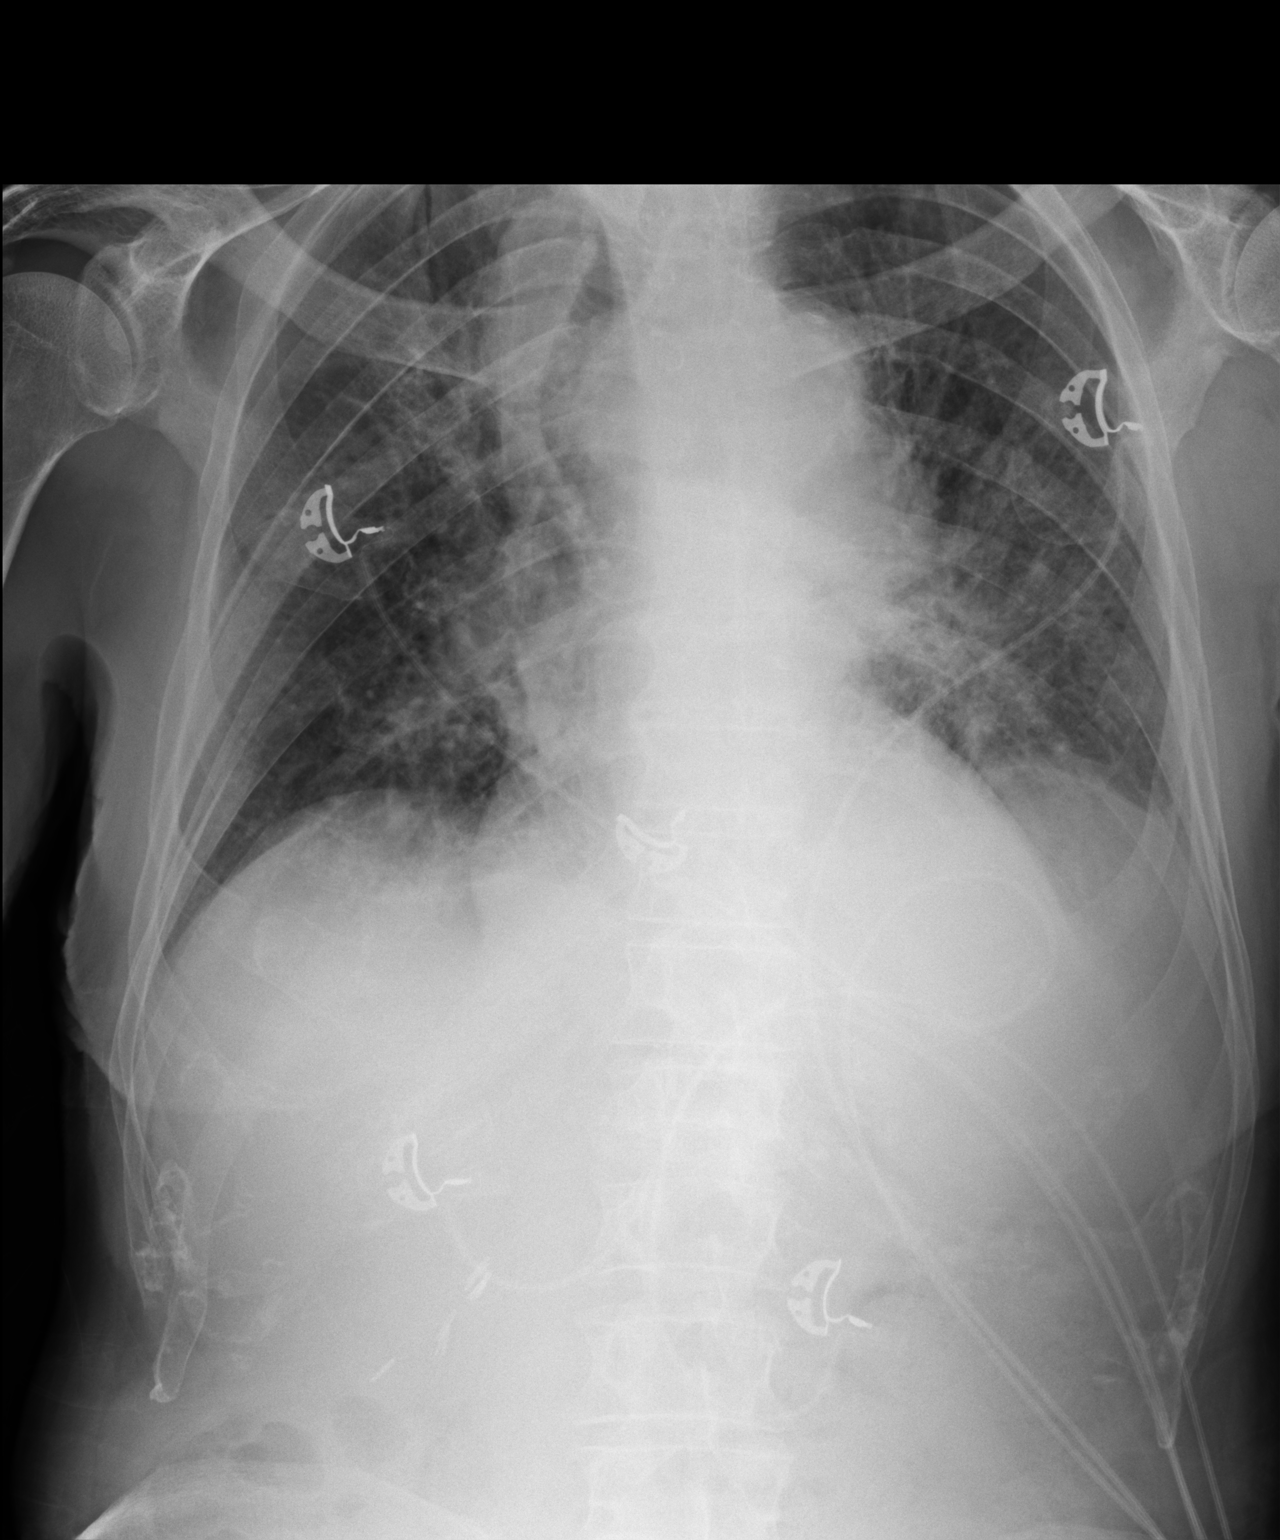

[1 of 1 positions shown; findings below may reference images not displayed]

FINDINGS: Upper normal heart size.

Atherosclerotic calcification.

Significant mass effect upon the LEFT lateral aspect of the trachea
suggesting presence of mediastinal mass or adenopathy, increased in
prominence since 0176.

Slight prominence of RIGHT paratracheal soft tissues.

Mild LEFT hilar fullness, more conspicuous than on the previous
study due to minimal rotation to the RIGHT.

Infiltrates in LEFT lower lobe persist, with improved aeration in
RIGHT lower lobe since previous exam.

No pleural effusion or pneumothorax.

Bones demineralized.
IMPRESSION: Persistent LEFT lower lobe infiltrate with slightly improved
aeration in RIGHT lower lobe.

Enlargement of LEFT hilum with mass effect upon the trachea
suggesting presence of mediastinal mass or adenopathy; CT chest with
contrast recommended to exclude mass and adenopathy.

## 2017-06-05 IMAGING — CT CT CHEST W/ CM
2 of 3 series · 15 of 36 positions shown, 18 images · IV contrast (APPLIED)
Comparison: Chest radiographs 3963 hours today and earlier. CT
Abdomen and Pelvis 02/03/2012. Chest CTA 09/11/2009.

CLINICAL DATA: 88-year-old male with productive cough for 2 days.
Shortness of breath. Esophageal cancer. Initial encounter.

EXAM:
CT CHEST WITH CONTRAST
TECHNIQUE: Multidetector CT imaging of the chest was performed during
intravenous contrast administration.
CONTRAST:  75mL [id number] IOPAMIDOL ([id number]) INJECTION 61%

[Series 3: thorax · axial · 0.66mm/px · z∈[+1202,+1496]mm · 12 of 173 slices shown, 15 images]
[im 13/173  mediastinal]
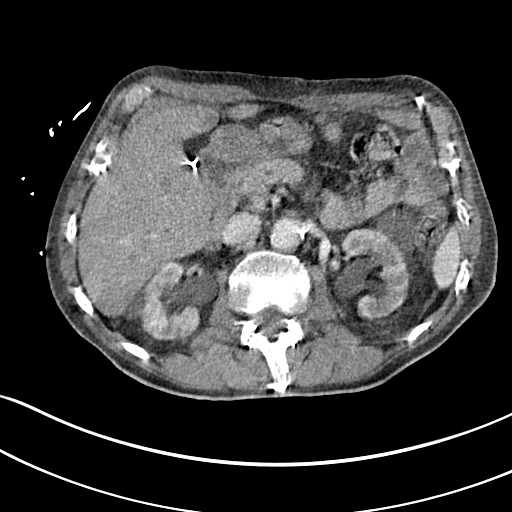
[im 13/173  lung]
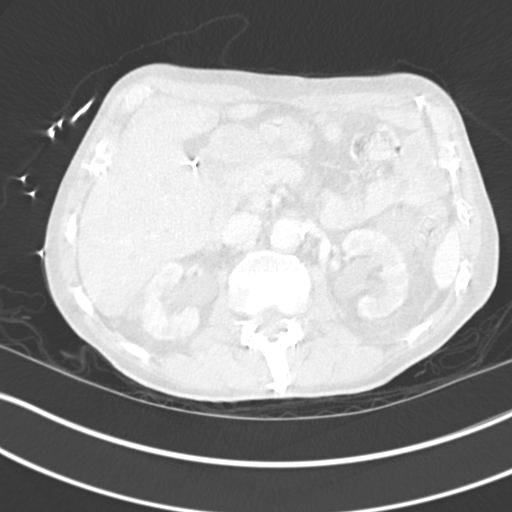
[im 26/173  lung]
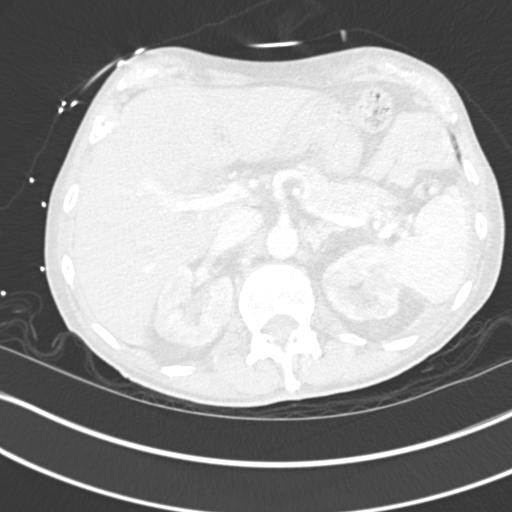
[im 39/173  lung]
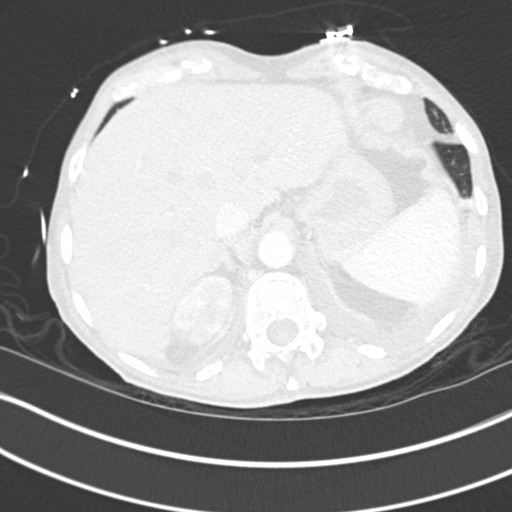
[im 51/173  lung]
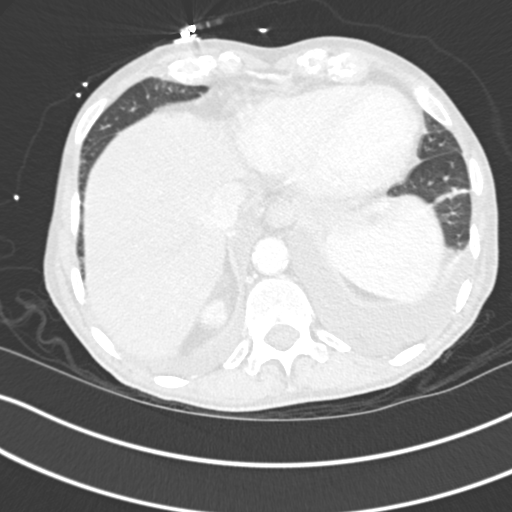
[im 64/173  mediastinal]
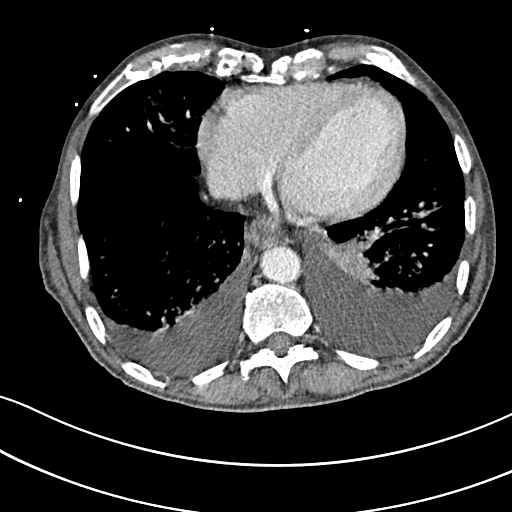
[im 64/173  lung]
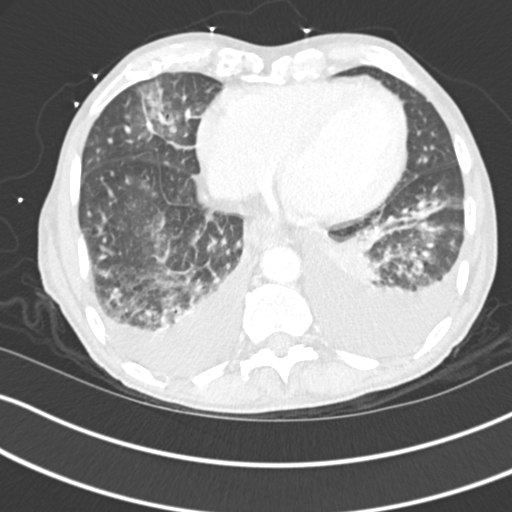
[im 77/173  lung]
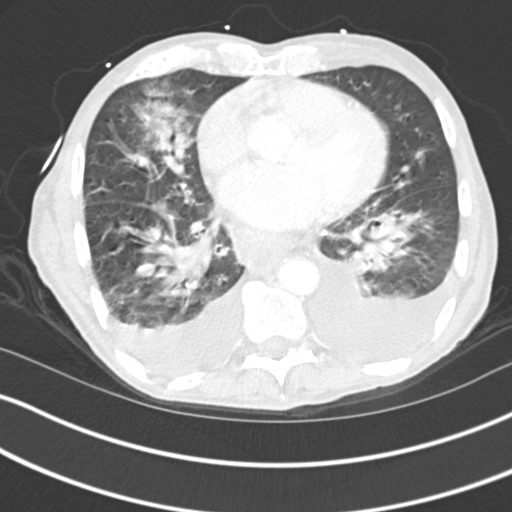
[im 96/173  lung]
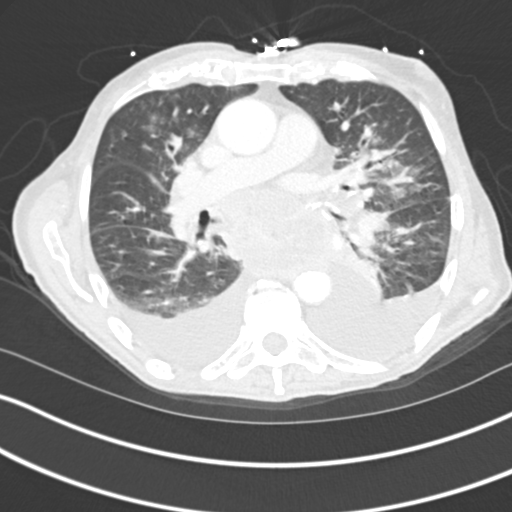
[im 109/173  lung]
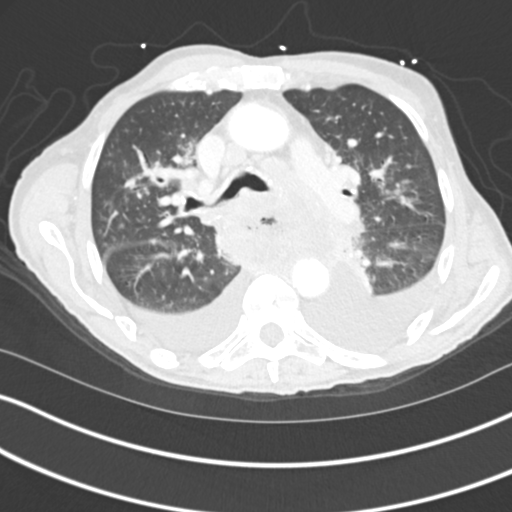
[im 122/173  mediastinal]
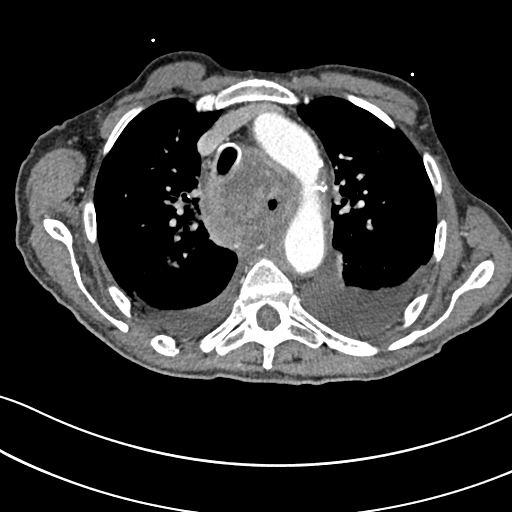
[im 122/173  lung]
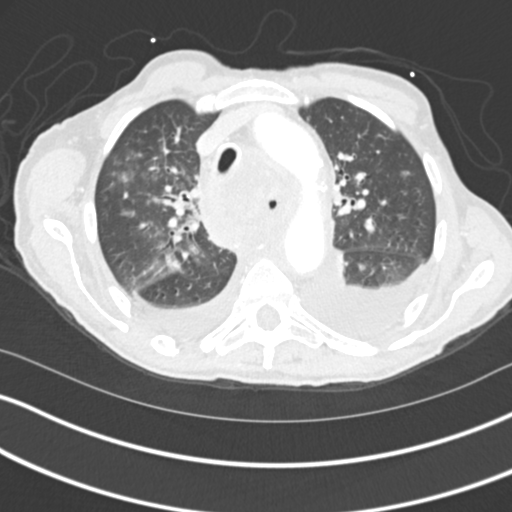
[im 134/173  lung]
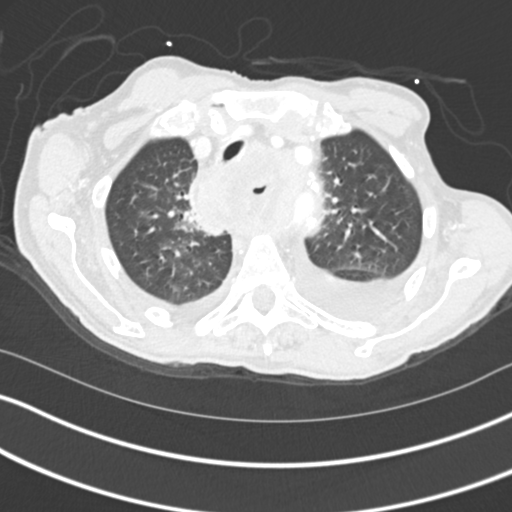
[im 147/173  lung]
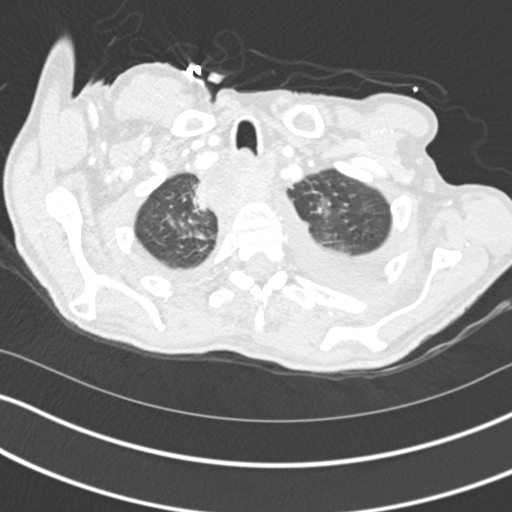
[im 160/173  lung]
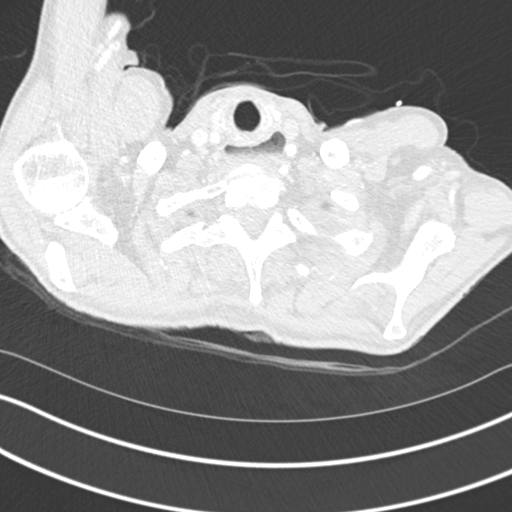

[Series 5: coronal · coronal · 0.60mm/px · 3 of 151 slices shown]
[im 31/151  lung]
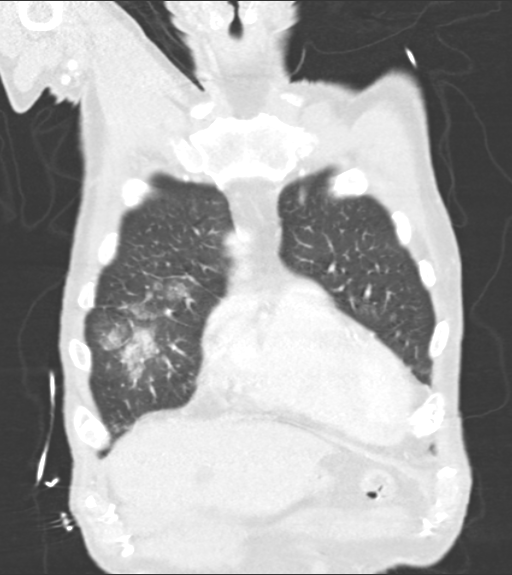
[im 61/151  lung]
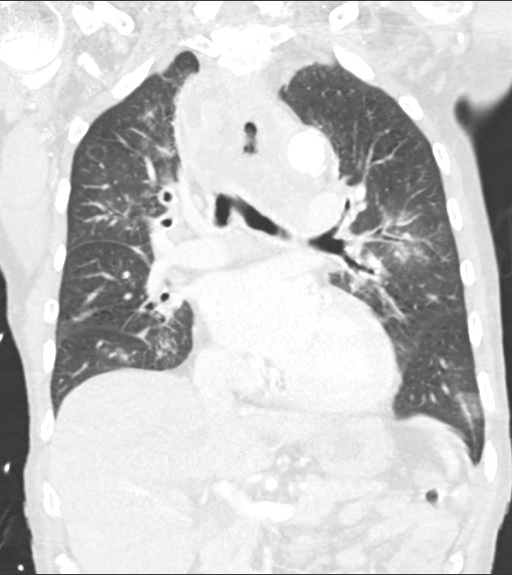
[im 91/151  lung]
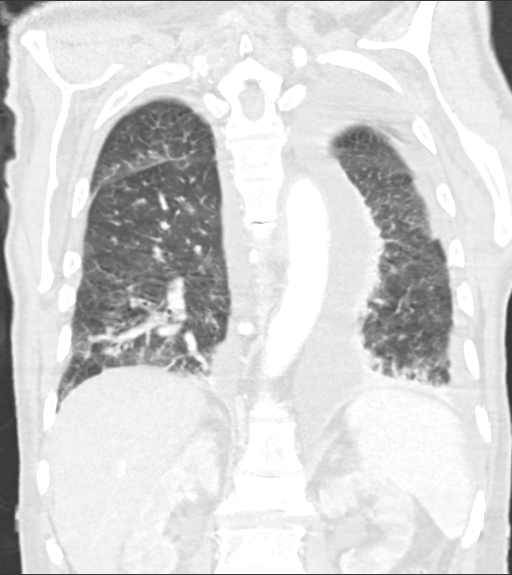

[15 of 36 positions shown; findings below may reference images not displayed]

FINDINGS: Massive mediastinal tumor infiltrating outwardly from the esophagus
in the posterior mediastinum encompassing 7.1 by 8.2 by 16.1 cm (AP
by transverse by CC). Associated regional mass effect. The tumor
appears centrally necrotic in the region of the esophageal lumen at
the carina, with gas bubbles located centrally. The more proximal
esophagus at the thoracic inlet is markedly dilated up to 4.4 cm
diameter.

Tumor has engulfed bronchial artery branches arising from the
descending thoracic aorta. Tumor has eroded into the mainstem
bronchi. There are retained secretions in the right mainstem
bronchus. Tumor has infiltrated into the AP window and toward both
hila. Superimposed malignant anterior carina and other mediastinal
lymph nodes up to 16 mm individually. The distal most thoracic
esophagus and gastroesophageal junction are spared. No pericardial
effusion.

Moderate to large layering left and moderate layering right pleural
effusions. Abnormal peribronchial ground-glass and more confluent
pulmonary opacity in all lobes. Superimposed septal thickening.

Calcified aortic atherosclerosis. Surgically absent gallbladder.
Stable visible liver, spleen, pancreas, and visualized intestines in
the upper abdomen since 0745.

There is new bilateral hydronephrosis, moderate to severe. The
entire kidneys are not included. Obstructing etiology is not
identified.

L1 inferior endplate compression fracture with mildly displaced
endplate fragments. This appears somewhat subacute, with some
periosteal new bone formation. However, there is motion artifact at
this level of the scan. Despite the extensive posterior mediastinal
tumor, no infiltration of the thoracic spine is identified. Other
visualized osseous structures appear stable.
IMPRESSION: 1. Massive centrally necrotic mediastinal tumor infiltrating
outwardly from the esophagus. Tumor encompasses 7.1 x 8.2 x 16.1 cm
(AP by transverse by CC). Erosion into the mainstem bronchi
suspected. Obstruction of the proximal esophagus which is dilated up
to 4 cm. Anterior carina malignant lymphadenopathy.
2. Superimposed bilateral multilobar bronchopneumonia with moderate
to large layering pleural effusions greater on the left.
3. Subacute appearing L1 inferior endplate compression fracture.
Favor this is a benign fracture as no other osseous metastatic
disease is identified.
